# Patient Record
Sex: Female | Born: 1955 | Race: Black or African American | Hispanic: No | Marital: Single | Smoking: Former smoker
Health system: Southern US, Community
[De-identification: ages and names within clinical notes are randomized; demographics above are authoritative.]

## PROBLEM LIST (undated history)

## (undated) DIAGNOSIS — IMO0002 Reserved for concepts with insufficient information to code with codable children: Secondary | ICD-10-CM

## (undated) DIAGNOSIS — N809 Endometriosis, unspecified: Secondary | ICD-10-CM

## (undated) DIAGNOSIS — I10 Essential (primary) hypertension: Secondary | ICD-10-CM

## (undated) DIAGNOSIS — D649 Anemia, unspecified: Secondary | ICD-10-CM

## (undated) DIAGNOSIS — H1010 Acute atopic conjunctivitis, unspecified eye: Secondary | ICD-10-CM

## (undated) DIAGNOSIS — E119 Type 2 diabetes mellitus without complications: Secondary | ICD-10-CM

## (undated) DIAGNOSIS — I517 Cardiomegaly: Secondary | ICD-10-CM

## (undated) DIAGNOSIS — N189 Chronic kidney disease, unspecified: Secondary | ICD-10-CM

## (undated) DIAGNOSIS — F419 Anxiety disorder, unspecified: Secondary | ICD-10-CM

## (undated) DIAGNOSIS — G709 Myoneural disorder, unspecified: Secondary | ICD-10-CM

## (undated) DIAGNOSIS — I639 Cerebral infarction, unspecified: Secondary | ICD-10-CM

## (undated) DIAGNOSIS — M109 Gout, unspecified: Secondary | ICD-10-CM

## (undated) DIAGNOSIS — Z9289 Personal history of other medical treatment: Secondary | ICD-10-CM

## (undated) DIAGNOSIS — F329 Major depressive disorder, single episode, unspecified: Secondary | ICD-10-CM

## (undated) DIAGNOSIS — N939 Abnormal uterine and vaginal bleeding, unspecified: Secondary | ICD-10-CM

## (undated) DIAGNOSIS — G4700 Insomnia, unspecified: Secondary | ICD-10-CM

## (undated) DIAGNOSIS — R51 Headache: Secondary | ICD-10-CM

## (undated) DIAGNOSIS — E039 Hypothyroidism, unspecified: Secondary | ICD-10-CM

## (undated) DIAGNOSIS — L0390 Cellulitis, unspecified: Secondary | ICD-10-CM

## (undated) DIAGNOSIS — L03115 Cellulitis of right lower limb: Secondary | ICD-10-CM

## (undated) DIAGNOSIS — F32A Depression, unspecified: Secondary | ICD-10-CM

## (undated) HISTORY — PX: TONSILLECTOMY: SUR1361

## (undated) HISTORY — DX: Anemia, unspecified: D64.9

## (undated) HISTORY — DX: Essential (primary) hypertension: I10

## (undated) HISTORY — PX: WISDOM TOOTH EXTRACTION: SHX21

---

## 1987-11-20 DIAGNOSIS — Z9289 Personal history of other medical treatment: Secondary | ICD-10-CM

## 1987-11-20 HISTORY — DX: Personal history of other medical treatment: Z92.89

## 2006-01-22 ENCOUNTER — Emergency Department (HOSPITAL_COMMUNITY): Admission: EM | Admit: 2006-01-22 | Discharge: 2006-01-22 | Payer: Self-pay | Admitting: Emergency Medicine

## 2006-02-19 ENCOUNTER — Ambulatory Visit: Payer: Self-pay | Admitting: Internal Medicine

## 2006-02-21 ENCOUNTER — Ambulatory Visit: Payer: Self-pay | Admitting: *Deleted

## 2006-03-18 ENCOUNTER — Ambulatory Visit: Payer: Self-pay | Admitting: Internal Medicine

## 2006-05-14 ENCOUNTER — Ambulatory Visit: Payer: Self-pay | Admitting: Internal Medicine

## 2006-05-14 LAB — CONVERTED CEMR LAB: Pap Smear: NORMAL

## 2006-08-02 ENCOUNTER — Ambulatory Visit: Payer: Self-pay | Admitting: Internal Medicine

## 2006-08-13 ENCOUNTER — Ambulatory Visit (HOSPITAL_COMMUNITY): Admission: RE | Admit: 2006-08-13 | Discharge: 2006-08-13 | Payer: Self-pay | Admitting: Internal Medicine

## 2006-08-26 ENCOUNTER — Ambulatory Visit: Payer: Self-pay | Admitting: Internal Medicine

## 2007-04-12 ENCOUNTER — Encounter (INDEPENDENT_AMBULATORY_CARE_PROVIDER_SITE_OTHER): Payer: Self-pay | Admitting: Internal Medicine

## 2007-04-12 DIAGNOSIS — I10 Essential (primary) hypertension: Secondary | ICD-10-CM | POA: Insufficient documentation

## 2007-04-12 DIAGNOSIS — I517 Cardiomegaly: Secondary | ICD-10-CM

## 2007-04-12 DIAGNOSIS — IMO0002 Reserved for concepts with insufficient information to code with codable children: Secondary | ICD-10-CM

## 2007-04-12 HISTORY — DX: Essential (primary) hypertension: I10

## 2010-12-09 ENCOUNTER — Encounter: Payer: Self-pay | Admitting: Internal Medicine

## 2011-04-26 ENCOUNTER — Ambulatory Visit: Payer: Self-pay | Admitting: Internal Medicine

## 2011-11-20 DIAGNOSIS — I639 Cerebral infarction, unspecified: Secondary | ICD-10-CM

## 2011-11-20 HISTORY — DX: Cerebral infarction, unspecified: I63.9

## 2011-11-20 HISTORY — PX: DILATION AND CURETTAGE OF UTERUS: SHX78

## 2011-11-29 ENCOUNTER — Emergency Department (HOSPITAL_COMMUNITY)
Admission: EM | Admit: 2011-11-29 | Discharge: 2011-11-29 | Disposition: A | Discharge status: Home / Self Care | Payer: Medicare Other | Attending: Emergency Medicine | Admitting: Emergency Medicine

## 2011-11-29 ENCOUNTER — Encounter (HOSPITAL_COMMUNITY): Payer: Self-pay | Admitting: *Deleted

## 2011-11-29 DIAGNOSIS — Y92009 Unspecified place in unspecified non-institutional (private) residence as the place of occurrence of the external cause: Secondary | ICD-10-CM | POA: Insufficient documentation

## 2011-11-29 DIAGNOSIS — I10 Essential (primary) hypertension: Secondary | ICD-10-CM | POA: Insufficient documentation

## 2011-11-29 DIAGNOSIS — J705 Respiratory conditions due to smoke inhalation: Secondary | ICD-10-CM | POA: Insufficient documentation

## 2011-11-29 DIAGNOSIS — R062 Wheezing: Secondary | ICD-10-CM | POA: Insufficient documentation

## 2011-11-29 DIAGNOSIS — X001XXA Exposure to smoke in uncontrolled fire in building or structure, initial encounter: Secondary | ICD-10-CM | POA: Insufficient documentation

## 2011-11-29 DIAGNOSIS — R059 Cough, unspecified: Secondary | ICD-10-CM | POA: Insufficient documentation

## 2011-11-29 DIAGNOSIS — R05 Cough: Secondary | ICD-10-CM | POA: Insufficient documentation

## 2011-11-29 DIAGNOSIS — T59811A Toxic effect of smoke, accidental (unintentional), initial encounter: Secondary | ICD-10-CM

## 2011-11-29 HISTORY — DX: Morbid (severe) obesity due to excess calories: E66.01

## 2011-11-29 HISTORY — DX: Essential (primary) hypertension: I10

## 2011-11-29 MED ORDER — TRAMADOL HCL 50 MG PO TABS
ORAL_TABLET | ORAL | Status: AC
  Administered 2011-11-29: 50 mg via ORAL
  Filled 2011-11-29: qty 1

## 2011-11-29 MED ORDER — TRAMADOL HCL 50 MG PO TABS
50.0000 mg | ORAL_TABLET | Freq: Once | ORAL | Status: AC
  Administered 2011-11-29: 50 mg via ORAL
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
## 2011-11-29 NOTE — ED Notes (Signed)
Pt updated to status, [facility] room [facility] but needs special bed delivered there and then ambulance transport.  Social worker Dahlia Client states she is taking care of everything.

## 2011-11-29 NOTE — ED Notes (Signed)
Pt updated on wait.  °

## 2011-11-29 NOTE — Progress Notes (Signed)
Just received call from [facility].  They will accept patient today.  Will hold patient until bariatric bed can be delivered to facility.  Will complete necessary  Paperwork for transfer and patient will require a ambulance for transport and CSW will arrange.  Will also notify patient and complete insurance authorization for placement.  No other needs at this time.   Ashley Jacobs, MSW LCSW [phone]

## 2011-11-29 NOTE — Progress Notes (Signed)
CSW was called regarding bed situation and bed delivered.  Spoke with RN Pamelia Hoit, and is ready for transport to SNF.  Arranged [facility] ambulance to come and pick patient up.  Packet sent with patient and [facility] aware of transport.  No other needs at this time.  Patient family aware of dc plans as well.  Ashley Jacobs, MSW LCSW [phone]

## 2011-11-29 NOTE — ED Notes (Signed)
Social worker working on disposition, as pt does not have a home to go back to and has no family to go to at this time.
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
## 2011-11-29 NOTE — ED Notes (Signed)
Diet tray ordered 

## 2011-11-29 NOTE — ED Notes (Signed)
Social worker still states that we are waiting on bariatric bed to be delivered to nursing facility and then we will be able to transport pt.

## 2011-11-29 NOTE — Consult Note (Signed)
Clinical Social Worker aware of referral and working on placement for patient.  Given patient is obese and totally dependent with ADLs and needs, patient would not be recommended for placement in a shelter or assisted living.  Patient has been referred to nursing home for short term placement and CSW completed FL2 and Passar for documentation purposes.  Patient information was faxed to [facility] for review and awaiting decision regarding disposition.    Patient aware and agreeable for referral.  Will follow up once clinicals have been reviewed.  Ashley Jacobs, MSW LCSW [phone]

## 2012-04-05 ENCOUNTER — Inpatient Hospital Stay (HOSPITAL_COMMUNITY)
Admission: EM | Admit: 2012-04-05 | Discharge: 2012-04-08 | DRG: 684 | Disposition: A | Discharge status: Skilled Nursing Facility | Payer: Medicare Other | Attending: Family Medicine | Admitting: Family Medicine

## 2012-04-05 ENCOUNTER — Encounter (HOSPITAL_COMMUNITY): Payer: Self-pay | Admitting: Emergency Medicine

## 2012-04-05 DIAGNOSIS — N19 Unspecified kidney failure: Secondary | ICD-10-CM

## 2012-04-05 DIAGNOSIS — E875 Hyperkalemia: Secondary | ICD-10-CM | POA: Diagnosis present

## 2012-04-05 DIAGNOSIS — N170 Acute kidney failure with tubular necrosis: Principal | ICD-10-CM | POA: Diagnosis present

## 2012-04-05 DIAGNOSIS — I10 Essential (primary) hypertension: Secondary | ICD-10-CM | POA: Diagnosis present

## 2012-04-05 DIAGNOSIS — I517 Cardiomegaly: Secondary | ICD-10-CM | POA: Diagnosis present

## 2012-04-05 DIAGNOSIS — E860 Dehydration: Secondary | ICD-10-CM | POA: Diagnosis present

## 2012-04-05 DIAGNOSIS — Z79899 Other long term (current) drug therapy: Secondary | ICD-10-CM

## 2012-04-05 DIAGNOSIS — Z8673 Personal history of transient ischemic attack (TIA), and cerebral infarction without residual deficits: Secondary | ICD-10-CM

## 2012-04-05 DIAGNOSIS — Z23 Encounter for immunization: Secondary | ICD-10-CM

## 2012-04-05 DIAGNOSIS — A088 Other specified intestinal infections: Secondary | ICD-10-CM | POA: Diagnosis present

## 2012-04-05 DIAGNOSIS — F17200 Nicotine dependence, unspecified, uncomplicated: Secondary | ICD-10-CM | POA: Diagnosis present

## 2012-04-05 HISTORY — DX: Cerebral infarction, unspecified: I63.9

## 2012-04-05 LAB — URINALYSIS, ROUTINE W REFLEX MICROSCOPIC
Ketones, ur: NEGATIVE mg/dL
Leukocytes, UA: NEGATIVE
Nitrite: NEGATIVE
Protein, ur: NEGATIVE mg/dL
pH: 5 (ref 5.0–8.0)

## 2012-04-05 LAB — COMPREHENSIVE METABOLIC PANEL
ALT: 17 U/L (ref 0–35)
AST: 22 U/L (ref 0–37)
Albumin: 3.5 g/dL (ref 3.5–5.2)
Calcium: 9.6 mg/dL (ref 8.4–10.5)
Creatinine, Ser: 3.58 mg/dL — ABNORMAL HIGH (ref 0.50–1.10)
Sodium: 133 mEq/L — ABNORMAL LOW (ref 135–145)

## 2012-04-05 LAB — DIFFERENTIAL
Basophils Relative: 0 % (ref 0–1)
Eosinophils Absolute: 0 10*3/uL (ref 0.0–0.7)
Eosinophils Relative: 0 % (ref 0–5)
Lymphs Abs: 1.1 10*3/uL (ref 0.7–4.0)
Neutrophils Relative %: 79 % — ABNORMAL HIGH (ref 43–77)

## 2012-04-05 LAB — CBC
MCH: 29.8 pg (ref 26.0–34.0)
MCHC: 31.8 g/dL (ref 30.0–36.0)
MCV: 93.6 fL (ref 78.0–100.0)
Platelets: 323 10*3/uL (ref 150–400)
RBC: 3.26 MIL/uL — ABNORMAL LOW (ref 3.87–5.11)
RDW: 13.5 % (ref 11.5–15.5)

## 2012-04-05 LAB — OCCULT BLOOD, POC DEVICE: Fecal Occult Bld: NEGATIVE

## 2012-04-05 LAB — LACTIC ACID, PLASMA: Lactic Acid, Venous: 0.8 mmol/L (ref 0.5–2.2)

## 2012-04-05 MED ORDER — SODIUM POLYSTYRENE SULFONATE 15 GM/60ML PO SUSP
15.0000 g | Freq: Once | ORAL | Status: AC
  Administered 2012-04-05: 15 g via ORAL
  Filled 2012-04-05: qty 60

## 2012-04-05 MED ORDER — ONDANSETRON HCL 4 MG/2ML IJ SOLN
4.0000 mg | Freq: Once | INTRAMUSCULAR | Status: AC
  Administered 2012-04-05: 4 mg via INTRAVENOUS
  Filled 2012-04-05: qty 2

## 2012-04-05 MED ORDER — MORPHINE SULFATE 4 MG/ML IJ SOLN
4.0000 mg | Freq: Once | INTRAMUSCULAR | Status: AC
  Administered 2012-04-05: 4 mg via INTRAVENOUS
  Filled 2012-04-05: qty 1

## 2012-04-05 MED ORDER — FUROSEMIDE 10 MG/ML IJ SOLN
40.0000 mg | Freq: Once | INTRAMUSCULAR | Status: AC
  Administered 2012-04-05: 40 mg via INTRAVENOUS
  Filled 2012-04-05: qty 4

## 2012-04-05 NOTE — ED Provider Notes (Signed)
History     CSN: [id number]  Arrival date & time 04/05/12  1727   First MD Initiated Contact with Patient 04/05/12 1728      Chief Complaint  Patient presents with  . Nausea  . Emesis     Patient is a 56 y.o. female presenting with vomiting. The history is provided by the patient.  Emesis  This is a new problem. The current episode started 1 to 2 hours ago. The problem has been gradually improving. The emesis has an appearance of stomach contents. There has been no fever. Associated symptoms include abdominal pain, chills and diarrhea. Risk factors: lives in nursing facility.  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 Hypertension   . Obesities, morbid   . Stroke     History reviewed. No pertinent past surgical history.  History reviewed. No pertinent family history.  History  Substance Use Topics  . Smoking status: Current Some Day Smoker -- 0.5 packs/day    Types: Cigarettes  . Smokeless tobacco: Not on file  . Alcohol Use: No    OB History    Grav Para Term Preterm Abortions TAB SAB Ect Mult Living                  Review of Systems  Constitutional: Positive for chills.  Cardiovascular: Negative for chest pain.  Gastrointestinal: Positive for vomiting, abdominal pain and diarrhea.  Skin: Negative for color change.  All other systems reviewed and are negative.    Allergies  Review of patient's allergies indicates no known allergies.  Home Medications   Current Outpatient Rx  Name Route Sig Dispense Refill  . CYCLOBENZAPRINE HCL 10 MG PO TABS Oral Take 10 mg by mouth 3 (three) times daily as needed. spasms    . FLUOXETINE HCL 20 MG  PO CAPS Oral Take 20 mg by mouth daily.    Marland Kitchen GABAPENTIN 300 MG PO CAPS Oral Take 900 mg by mouth 3 (three) times daily.    Marland Kitchen HYDROCHLOROTHIAZIDE 25 MG PO TABS Oral Take 25 mg by mouth daily.    Marland Kitchen LISINOPRIL 40 MG PO TABS Oral Take 40 mg by mouth daily.    Marland Kitchen METOPROLOL SUCCINATE ER 50 MG PO TB24 Oral Take 50 mg by mouth 2 (two) times daily.    Marland Kitchen POLYETHYLENE GLYCOL 3350 PO PACK Oral Take 17 g by mouth daily.    . TRAMADOL HCL 50 MG PO TABS Oral Take 50 mg by mouth every 6 (six) hours as needed. pain    . TRIAMTERENE-HCTZ 37.5-25 MG PO TABS Oral Take 1 tablet by mouth daily.      BP 115/62  Pulse 92  Temp(Src) 98.8 F (37.1 C) (Oral)  Resp 18  SpO2 99%  Physical Exam CONSTITUTIONAL: Well developed/well nourished HEAD AND FACE: Normocephalic/atraumatic EYES: EOMI/PERRL, no icterus ENMT: Mucous membranes moist NECK: supple no meningeal signs CV: S1/S2 noted, no murmurs/rubs/gallops noted LUNGS: Lungs are clear to auscultation bilaterally, no apparent distress ABDOMEN: morbidly obese, soft, but diffuse tenderness noted.  No rebound or guarding is noted Rectal - stool color normal, chaperone present NEURO: Pt is awake/alert, no distress EXTREMITIES: pulses normal, full ROM SKIN: warm, color  normal PSYCH: no abnormalities of mood noted  ED Course  Procedures    Labs Reviewed  OCCULT BLOOD, POC DEVICE  CBC  DIFFERENTIAL  COMPREHENSIVE METABOLIC PANEL  LIPASE, BLOOD  LACTIC ACID, PLASMA  URINALYSIS, ROUTINE W REFLEX MICROSCOPIC  URINE CULTURE  7:07 PM Pt with abd pain and vomiting/diarrhea, stable at this time Labs ordered and then will reassess 8:27 PM Labs pending Pt taking PO fluids at this time, reports improvement 11:45 PM Pt stable, no further pain but renal failure noted with hyperK Repeat EKG performed and no significant change Will admit Lasix/kayexalate ordered Abdomen soft, I doubt acute abdominal process D/w FP resident, will admit to family medicine  (unassigned) Pt agreeable  MDM  Nursing notes reviewed and considered in documentation All labs/vitals reviewed and considered        Date: 04/05/2012  Rate: 93  Rhythm: normal sinus rhythm  QRS Axis: left  Intervals: normal  ST/T Wave abnormalities: nonspecific ST changes  Conduction Disutrbances:none  Narrative Interpretation:   Old EKG Reviewed: none available at time of interpretation Diffuse ST elevation likely due to early repol, no STEMI   Joya Gaskins, MD 04/06/12 0005

## 2012-04-05 NOTE — ED Notes (Signed)
Pt placed on bariatric bed

## 2012-04-05 NOTE — ED Notes (Signed)
IV team at bedside 

## 2012-04-05 NOTE — ED Notes (Addendum)
Call Lab regarding CMET results. Potassium level 6.4 with no hemolysis. Results given by [facility] in main lab. Dr. Bebe Shaggy made aware.

## 2012-04-05 NOTE — ED Notes (Addendum)
Pt. From [facility]. Diarrhea and chills with increasing Left sided abd pain and distention for 2 days. Pain 9 increases with palpation. One episode of vomiting this morning. No blood. Pt. States she feels better after vomiting. Bed bound. A&0 x3

## 2012-04-05 NOTE — ED Notes (Signed)
Phkeblotomy made aware that we need another green top on pt because of hemolyzation. Stated they will get it. Will continue to monitor.

## 2012-04-05 NOTE — ED Notes (Signed)
Labs sent down to main lab. Will continue to monitor.

## 2012-04-06 ENCOUNTER — Inpatient Hospital Stay (HOSPITAL_COMMUNITY): Payer: Medicare Other

## 2012-04-06 DIAGNOSIS — E860 Dehydration: Secondary | ICD-10-CM

## 2012-04-06 DIAGNOSIS — N179 Acute kidney failure, unspecified: Secondary | ICD-10-CM

## 2012-04-06 LAB — PROTIME-INR
INR: 1.1 (ref 0.00–1.49)
Prothrombin Time: 14.4 seconds (ref 11.6–15.2)

## 2012-04-06 LAB — SODIUM, URINE, RANDOM: Sodium, Ur: 85 mEq/L

## 2012-04-06 LAB — CBC
HCT: 34.2 % — ABNORMAL LOW (ref 36.0–46.0)
MCHC: 30.1 g/dL (ref 30.0–36.0)
Platelets: 302 10*3/uL (ref 150–400)
RDW: 13.9 % (ref 11.5–15.5)

## 2012-04-06 LAB — CARDIAC PANEL(CRET KIN+CKTOT+MB+TROPI)
CK, MB: 3 ng/mL (ref 0.3–4.0)
CK, MB: 3.1 ng/mL (ref 0.3–4.0)
Relative Index: 1.4 (ref 0.0–2.5)
Total CK: 225 U/L — ABNORMAL HIGH (ref 7–177)
Total CK: 243 U/L — ABNORMAL HIGH (ref 7–177)

## 2012-04-06 LAB — CLOSTRIDIUM DIFFICILE BY PCR: Toxigenic C. Difficile by PCR: NEGATIVE

## 2012-04-06 LAB — BASIC METABOLIC PANEL
BUN: 83 mg/dL — ABNORMAL HIGH (ref 6–23)
Calcium: 9.7 mg/dL (ref 8.4–10.5)
Creatinine, Ser: 3.46 mg/dL — ABNORMAL HIGH (ref 0.50–1.10)
GFR calc Af Amer: 16 mL/min — ABNORMAL LOW (ref >90)
GFR calc non Af Amer: 14 mL/min — ABNORMAL LOW (ref >90)

## 2012-04-06 LAB — POTASSIUM: Potassium: 5.2 mEq/L — ABNORMAL HIGH (ref 3.5–5.1)

## 2012-04-06 MED ORDER — ONDANSETRON HCL 4 MG PO TABS: 4.0000 mg | ORAL_TABLET | Freq: Four times a day (QID) | ORAL | Status: DC | PRN

## 2012-04-06 MED ORDER — ONDANSETRON HCL 4 MG/2ML IJ SOLN: 4.0000 mg | Freq: Four times a day (QID) | INTRAMUSCULAR | Status: DC | PRN

## 2012-04-06 MED ORDER — DEXTROSE-NACL 5-0.45 % IV SOLN
INTRAVENOUS | Status: DC
  Administered 2012-04-06 – 2012-04-07 (×5): via INTRAVENOUS
  Administered 2012-04-08: 20 mL/h via INTRAVENOUS

## 2012-04-06 MED ORDER — ASPIRIN 325 MG PO TABS
325.0000 mg | ORAL_TABLET | Freq: Every day | ORAL | Status: DC
  Administered 2012-04-07 – 2012-04-08 (×2): 325 mg via ORAL
  Filled 2012-04-06 (×2): qty 1

## 2012-04-06 MED ORDER — TRAZODONE HCL 50 MG PO TABS
50.0000 mg | ORAL_TABLET | Freq: Every evening | ORAL | Status: DC | PRN
  Filled 2012-04-06: qty 1

## 2012-04-06 MED ORDER — HEPARIN BOLUS VIA INFUSION
4000.0000 [IU] | Freq: Once | INTRAVENOUS | Status: AC
  Administered 2012-04-06: 4000 [IU] via INTRAVENOUS
  Filled 2012-04-06: qty 4000

## 2012-04-06 MED ORDER — SODIUM POLYSTYRENE SULFONATE 15 GM/60ML PO SUSP
30.0000 g | Freq: Once | ORAL | Status: AC
  Administered 2012-04-06: 30 g via ORAL
  Filled 2012-04-06: qty 120

## 2012-04-06 MED ORDER — NITROGLYCERIN 0.4 MG SL SUBL: 0.4000 mg | SUBLINGUAL_TABLET | SUBLINGUAL | Status: DC | PRN

## 2012-04-06 MED ORDER — FLUOXETINE HCL 20 MG PO CAPS
20.0000 mg | ORAL_CAPSULE | Freq: Every day | ORAL | Status: DC
  Administered 2012-04-06 – 2012-04-08 (×3): 20 mg via ORAL
  Filled 2012-04-06 (×3): qty 1

## 2012-04-06 MED ORDER — SODIUM CHLORIDE 0.9 % IV BOLUS (SEPSIS)
1000.0000 mL | Freq: Once | INTRAVENOUS | Status: AC
  Administered 2012-04-06: 1000 mL via INTRAVENOUS

## 2012-04-06 MED ORDER — ACETAMINOPHEN 325 MG PO TABS
650.0000 mg | ORAL_TABLET | Freq: Four times a day (QID) | ORAL | Status: DC | PRN
  Administered 2012-04-07 – 2012-04-08 (×2): 650 mg via ORAL
  Filled 2012-04-06 (×3): qty 2

## 2012-04-06 MED ORDER — ENOXAPARIN SODIUM 40 MG/0.4ML ~~LOC~~ SOLN
40.0000 mg | SUBCUTANEOUS | Status: DC
  Filled 2012-04-06: qty 0.4

## 2012-04-06 MED ORDER — SODIUM CHLORIDE 0.9 % IJ SOLN
3.0000 mL | Freq: Two times a day (BID) | INTRAMUSCULAR | Status: DC
  Administered 2012-04-06 – 2012-04-08 (×5): 3 mL via INTRAVENOUS

## 2012-04-06 MED ORDER — ALUM & MAG HYDROXIDE-SIMETH 200-200-20 MG/5ML PO SUSP
30.0000 mL | Freq: Four times a day (QID) | ORAL | Status: DC | PRN
Start: 2012-04-06 — End: 2012-04-08
  Filled 2012-04-06: qty 30

## 2012-04-06 MED ORDER — HEPARIN (PORCINE) IN NACL 100-0.45 UNIT/ML-% IJ SOLN
1300.0000 [IU]/h | INTRAMUSCULAR | Status: DC
  Administered 2012-04-06: 1300 [IU]/h via INTRAVENOUS
  Filled 2012-04-06 (×2): qty 250

## 2012-04-06 MED ORDER — ACETAMINOPHEN 650 MG RE SUPP: 650.0000 mg | Freq: Four times a day (QID) | RECTAL | Status: DC | PRN

## 2012-04-06 MED ORDER — ASPIRIN 81 MG PO CHEW
162.0000 mg | CHEWABLE_TABLET | Freq: Once | ORAL | Status: AC
  Administered 2012-04-06: 162 mg via ORAL
  Filled 2012-04-06: qty 2

## 2012-04-06 MED ORDER — METOPROLOL TARTRATE 12.5 MG HALF TABLET
12.5000 mg | ORAL_TABLET | Freq: Two times a day (BID) | ORAL | Status: DC
  Administered 2012-04-08: 12.5 mg via ORAL
  Filled 2012-04-06 (×5): qty 1

## 2012-04-06 NOTE — Progress Notes (Signed)
ANTICOAGULATION CONSULT NOTE - Initial Consult  Pharmacy Consult for IV heparin Indication: ACS/STEMI  No Known Allergies  Patient Measurements: Height: 5\' 9"  (175.3 cm) Weight:  (unable to  get weight bed was not zeroed out) IBW/kg (Calculated) : 66.2  Heparin Dosing Weight: 109.5kg  Vital Signs: Temp: 97.5 F (36.4 C) (05/19 0911) Temp src: Oral (05/19 0911) BP: 102/70 mmHg (05/19 0911) Pulse Rate: 79  (05/19 0911)  Labs:  Basename 04/06/12 0500 04/05/12 2208 04/05/12 1807  HGB 10.3* -- 9.7*  HCT 34.2* -- 30.5*  PLT 302 -- 323  APTT -- -- --  LABPROT -- -- --  INR -- -- --  HEPARINUNFRC -- -- --  CREATININE 3.46* 3.58* --  CKTOTAL -- -- --  CKMB -- -- --  TROPONINI -- -- --    The CrCl is unknown because both a height and weight (above a minimum accepted value) are required for this calculation.   Medical History: Past Medical History  Diagnosis Date  . Hypertension   . Obesities, morbid   . Stroke     Medications:  ASA 162mg   Assessment: 53 yoF presented with non-bloody diarrhea x 3 days, abdominal pain x 2days, and emesis, showing signs of ARF with elevated K, SCr.   On 5/19 EKG changes seen, cardiac enzymes pending, and pharmacy asked to manage IV heparin for ACS/STEMI.    Pt confirmed weight ~379lb (172.3kg) as of 2 days ago.  Estimated CrCl ~24 ml/min.  Pt states no hx bleeding.  Baseline PT/INR unknown.  LFTs WNL. Hgb 10.3, plts ok.  No bleeding noted.  No anticoagulants PTA.   Goal of Therapy:  Heparin level 0.3-0.7 units/ml Monitor platelets by anticoagulation protocol: Yes   Plan:  1.  Heparin bolus 4000 units IV x 1 2.  Heparin infusion 1300 units/hr 3.  STAT PT/INR 4.  F/u 8 hour heparin level 5.  Daily CBC, heparin level  Tali Cleaves E 04/06/2012,9:43 AM

## 2012-04-06 NOTE — ED Notes (Signed)
Pt transported to 6700 on zoll monitor with Les EMT and Marchelle Folks EMT.

## 2012-04-06 NOTE — ED Notes (Signed)
Rn unable to take report at this time. Will continue to monitor.

## 2012-04-06 NOTE — Progress Notes (Signed)
Pharmacy: Re-heparin  A:  Patient is a 56 y.o F on heparin for r/o ACS.  First heparin level back is therapeutic (0.31) but is at lower end of therapeutic range.  No bleeding noted.  P: 1) Continue current heparin rate for now. 2)  Will re-check another level in 6 hours to ensure level is still therapeutic before changing to daily monitoring.  Dorna Leitz, PharmD, BCPS

## 2012-04-06 NOTE — Progress Notes (Signed)
Received patient from ED,admitted due to nausea, projectile vomiting,abdominal pain and diarrhea x 2 days.Patient is alert and oriented x3.Patient is from [facility].Patient has history of CVA with left sided weakness,morbidly obese.Patient oriented to room and unit routines.Placed on telemetry and contact isolation for R/O C-diff.Foley catheter intact and draining clear urine.Skin is dry and intact.Edema +1 noted on both lower extremities.Sacrum is intact except for white spots where patient had healed pressure sore,no open area noted.Patient is on a bariatric bed.Will continue to monitor.Callbell in reach. Kashayla Ungerer Joselita,RN

## 2012-04-06 NOTE — H&P (Signed)
FMTS Attending Admit Note  Patient seen and examined by me, discussed with resident team.  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  She denies fevers or chills, no diaphoresis, dyspnea or chest discomfort.  She denies any history of cardiac or renal disease.  Of note, found to have markedly elevated serum Cr (3.58) and K+ 6.4 at admission.  Received lasix in ED and Kayexalate by the admitting team.   Patient had repeat ECG this morning that showed ST depressions in lateral leads.  This morning she denies chest pain or dyspnea/diaphoresis.  She endorses pain in L shoulder/arm, which is consistent in character with her chronic arm pain.  A repeat ECG done now shows resolution of the ST depressions.   Assess/Plan: 56yoF admitted with N/V/D and dehydration, presumed acute kidney injury prerenal from dehydration, and hyperkalemia (presumably secondary to renal dysfunction).  FeNa of questionable benefit in setting of thiazide administration, but may check along with serum Osm and urine Osm.  More concerning in the acute setting is the patient's ECG changes, which earlier today showed ST depression in lateral leads which have normalized.  She has been given ASA 325mg  x2, as well as started on O2 and heparin.  Given patient's lack of mobility, I consider this unstable angina.  Her nausea/vomiting certainly could be an anginal equivalent (more common in women than in men).   We are awaiting a non-hemolyzed K+ level, as well as cardiac enzymes.  Will consult the cardiology service for consultative opinion on whether patient is appropriate for catheterization or other testing.  We will continue to hydrate and follow Cr.  Paula Compton, MD

## 2012-04-06 NOTE — ED Notes (Signed)
Admitting at bedside 

## 2012-04-06 NOTE — ED Notes (Signed)
Nursing Communication: Pt resides at [facility].   Mother's #: [phone] [facility]) Legal Guardian
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
## 2012-04-06 NOTE — Consult Note (Signed)
Reason for Consult: Abdominal pain/minor EKG changes Referring Physician: Dr. Marta Lamas. Lauren Barber is an 56 y.o. female.  HPI: Patient is 56 year old female with past rectal history significant for hypertension history of right CVA with left paresis, morbid obesity, spinal degenerative joint disease, nursing home resident, bedbound, was admitted earlier today because of vague abdominal pain associated with nausea vomiting and poor appetite for last 3 days and was noted to be in acute renal failure with hyperkalemia at the time of admission. Cardiologic consultation is called as patient was noted to have vague epigastric pain associated with minimal slightly upsloping ST depression in inferolateral leads. When patient was noted to be tachycardic which resolved with slowing of heart rate. Patient denies any chest pain shortness of breath. Denies any palpitation lightheadedness or syncope. Patient complains of localized epigastric pain which increases with local pressure. Patient denies history of PND orthopnea but complains of leg swelling. Patient presently denies any complaints except for localized epigastric tenderness. Patient has 2 sets of cardiac enzymes which have been negative EKG repeat showed no acute ischemic changes.  Past Medical History  Diagnosis Date  . Hypertension   . Obesities, morbid   . Stroke     History reviewed. No pertinent past surgical history.  History reviewed. No pertinent family history.  Social History:  reports that she has been smoking Cigarettes.  She has been smoking about .5 packs per day. She does not have any smokeless tobacco history on file. She reports that she does not drink alcohol or use illicit drugs.  Allergies: No Known Allergies  Medications: I have reviewed the patient's current medications.  Results for orders placed during the hospital encounter of 04/05/12 (from the past 48 hour(s))  CBC     Status: Abnormal   Collection Time   04/05/12  6:07 PM      Component Value Range Comment   WBC 7.1  4.0 - 10.5 (K/uL)    RBC 3.26 (*) 3.87 - 5.11 (MIL/uL)    Hemoglobin 9.7 (*) 12.0 - 15.0 (g/dL)    HCT 40.9 (*) 81.1 - 46.0 (%)    MCV 93.6  78.0 - 100.0 (fL)    MCH 29.8  26.0 - 34.0 (pg)    MCHC 31.8  30.0 - 36.0 (g/dL)    RDW 91.4  78.2 - 95.6 (%)    Platelets 323  150 - 400 (K/uL)   DIFFERENTIAL     Status: Abnormal   Collection Time   04/05/12  6:07 PM      Component Value Range Comment   Neutrophils Relative 79 (*) 43 - 77 (%)    Neutro Abs 5.5  1.7 - 7.7 (K/uL)    Lymphocytes Relative 16  12 - 46 (%)    Lymphs Abs 1.1  0.7 - 4.0 (K/uL)    Monocytes Relative 5  3 - 12 (%)    Monocytes Absolute 0.4  0.1 - 1.0 (K/uL)    Eosinophils Relative 0  0 - 5 (%)    Eosinophils Absolute 0.0  0.0 - 0.7 (K/uL)    Basophils Relative 0  0 - 1 (%)    Basophils Absolute 0.0  0.0 - 0.1 (K/uL)   LACTIC ACID, PLASMA     Status: Normal   Collection Time   04/05/12  6:07 PM      Component Value Range Comment   Lactic Acid, Venous 0.8  0.5 - 2.2 (mmol/L)   OCCULT BLOOD, POC DEVICE  Status: Normal   Collection Time   04/05/12  6:13 PM      Component Value Range Comment   Fecal Occult Bld NEGATIVE     URINALYSIS, ROUTINE W REFLEX MICROSCOPIC     Status: Abnormal   Collection Time   04/05/12  6:49 PM      Component Value Range Comment   Color, Urine YELLOW  YELLOW     APPearance HAZY (*) CLEAR     Specific Gravity, Urine 1.019  1.005 - 1.030     pH 5.0  5.0 - 8.0     Glucose, UA NEGATIVE  NEGATIVE (mg/dL)    Hgb urine dipstick NEGATIVE  NEGATIVE     Bilirubin Urine SMALL (*) NEGATIVE     Ketones, ur NEGATIVE  NEGATIVE (mg/dL)    Protein, ur NEGATIVE  NEGATIVE (mg/dL)    Urobilinogen, UA 1.0  0.0 - 1.0 (mg/dL)    Nitrite NEGATIVE  NEGATIVE     Leukocytes, UA NEGATIVE  NEGATIVE  MICROSCOPIC NOT DONE ON URINES WITH NEGATIVE PROTEIN, BLOOD, LEUKOCYTES, NITRITE, OR GLUCOSE <1000 mg/dL.  COMPREHENSIVE METABOLIC PANEL     Status:  Abnormal   Collection Time   04/05/12 10:08 PM      Component Value Range Comment   Sodium 133 (*) 135 - 145 (mEq/L)    Potassium 6.4 (*) 3.5 - 5.1 (mEq/L)    Chloride 105  96 - 112 (mEq/L)    CO2 14 (*) 19 - 32 (mEq/L)    Glucose, Bld 106 (*) 70 - 99 (mg/dL)    BUN 83 (*) 6 - 23 (mg/dL)    Creatinine, Ser 1.61 (*) 0.50 - 1.10 (mg/dL)    Calcium 9.6  8.4 - 10.5 (mg/dL)    Total Protein 9.2 (*) 6.0 - 8.3 (g/dL)    Albumin 3.5  3.5 - 5.2 (g/dL)    AST 22  0 - 37 (U/L)    ALT 17  0 - 35 (U/L)    Alkaline Phosphatase 140 (*) 39 - 117 (U/L)    Total Bilirubin 0.2 (*) 0.3 - 1.2 (mg/dL)    GFR calc non Af Amer 13 (*) >90 (mL/min)    GFR calc Af Amer 16 (*) >90 (mL/min)   MRSA PCR SCREENING     Status: Normal   Collection Time   04/06/12  2:10 AM      Component Value Range Comment   MRSA by PCR NEGATIVE  NEGATIVE    BASIC METABOLIC PANEL     Status: Abnormal   Collection Time   04/06/12  5:00 AM      Component Value Range Comment   Sodium 134 (*) 135 - 145 (mEq/L)    Potassium 6.3 (*) 3.5 - 5.1 (mEq/L)    Chloride 103  96 - 112 (mEq/L)    CO2 11 (*) 19 - 32 (mEq/L)    Glucose, Bld 63 (*) 70 - 99 (mg/dL)    BUN 83 (*) 6 - 23 (mg/dL)    Creatinine, Ser 0.96 (*) 0.50 - 1.10 (mg/dL)    Calcium 9.7  8.4 - 10.5 (mg/dL)    GFR calc non Af Amer 14 (*) >90 (mL/min)    GFR calc Af Amer 16 (*) >90 (mL/min)   CBC     Status: Abnormal   Collection Time   04/06/12  5:00 AM      Component Value Range Comment   WBC 6.6  4.0 - 10.5 (K/uL)    RBC 3.54 (*)  3.87 - 5.11 (MIL/uL)    Hemoglobin 10.3 (*) 12.0 - 15.0 (g/dL)    HCT 16.1 (*) 09.6 - 46.0 (%)    MCV 96.6  78.0 - 100.0 (fL)    MCH 29.1  26.0 - 34.0 (pg)    MCHC 30.1  30.0 - 36.0 (g/dL)    RDW 04.5  40.9 - 81.1 (%)    Platelets 302  150 - 400 (K/uL)   CLOSTRIDIUM DIFFICILE BY PCR     Status: Normal   Collection Time   04/06/12  6:21 AM      Component Value Range Comment   C difficile by pcr NEGATIVE  NEGATIVE    SODIUM, URINE, RANDOM      Status: Normal   Collection Time   04/06/12  7:01 AM      Component Value Range Comment   Sodium, Ur 85     CREATININE, URINE, RANDOM     Status: Normal   Collection Time   04/06/12  7:01 AM      Component Value Range Comment   Creatinine, Urine 135.06     CARDIAC PANEL(CRET KIN+CKTOT+MB+TROPI)     Status: Abnormal   Collection Time   04/06/12  8:43 AM      Component Value Range Comment   Total CK 210 (*) 7 - 177 (U/L)    CK, MB 3.0  0.3 - 4.0 (ng/mL)    Troponin I <0.30  <0.30 (ng/mL)    Relative Index 1.4  0.0 - 2.5    POTASSIUM     Status: Abnormal   Collection Time   04/06/12  8:44 AM      Component Value Range Comment   Potassium 5.2 (*) 3.5 - 5.1 (mEq/L)   PROTIME-INR     Status: Normal   Collection Time   04/06/12 10:05 AM      Component Value Range Comment   Prothrombin Time 14.4  11.6 - 15.2 (seconds)    INR 1.10  0.00 - 1.49    CARDIAC PANEL(CRET KIN+CKTOT+MB+TROPI)     Status: Abnormal   Collection Time   04/06/12  2:25 PM      Component Value Range Comment   Total CK 225 (*) 7 - 177 (U/L)    CK, MB 3.1  0.3 - 4.0 (ng/mL)    Troponin I <0.30  <0.30 (ng/mL)    Relative Index 1.4  0.0 - 2.5    HEPARIN LEVEL (UNFRACTIONATED)     Status: Normal   Collection Time   04/06/12  6:03 PM      Component Value Range Comment   Heparin Unfractionated 0.31  0.30 - 0.70 (IU/mL)     No results found.  Review of Systems  Constitutional: Negative for fever, chills and weight loss.  Respiratory: Negative for cough, hemoptysis, sputum production and shortness of breath.   Cardiovascular: Positive for leg swelling. Negative for chest pain, palpitations and orthopnea.  Gastrointestinal: Positive for nausea, vomiting, abdominal pain and diarrhea.  Genitourinary: Negative for dysuria and urgency.  Musculoskeletal: Positive for back pain. Negative for myalgias.  Neurological: Negative for dizziness.   Blood pressure 102/56, pulse 90, temperature 97.6 F (36.4 C), temperature source  Oral, resp. rate 19, height 5\' 9"  (1.753 m), SpO2 96.00%. Physical Exam  Constitutional: She is oriented to person, place, and time. She appears well-developed.  HENT:  Head: Normocephalic and atraumatic.  Nose: Nose normal.  Mouth/Throat: No oropharyngeal exudate.  Eyes: Conjunctivae and EOM are normal. Pupils are equal,  round, and reactive to light. Left eye exhibits no discharge. No scleral icterus.  Neck: Normal range of motion. Neck supple. No JVD present. No thyromegaly present.  Cardiovascular: Normal rate and regular rhythm.        S1 and S2 soft no S3 gallop noted  Respiratory: Effort normal and breath sounds normal. No respiratory distress. She has no wheezes. She has no rales.  GI: Soft. Bowel sounds are normal. She exhibits distension. There is tenderness (Epigastric tenderness noted no rebound no guarding).  Musculoskeletal:       No clubbing cyanosis 1+ edema  Lymphadenopathy:    She has no cervical adenopathy.  Neurological: She is alert and oriented to person, place, and time.       Left sided paresis    Assessment/Plan: Abdominal pain with status post minor slightly upsloping ST T-wave changes in inferolateral leads doubt significant angina Acute renal failure  Dehydration Morbid obesity Hypertension History of CVA with left paresis Spinal degenerative joint disease Plan Agree with slow hydration aspirin and beta blockers Lifestyle modification and nutrition consult Check repeat EKG in a.m. Will hold off to any cardiac workup at this point in view of patient asymptomatic bedbound and morbidly obese. Check lipid panel, cardiac enzymes and renal panel in a.m.  Robynn Pane 04/06/2012, 7:00 PM     Cardiologic

## 2012-04-07 LAB — LIPID PANEL
Cholesterol: 120 mg/dL (ref 0–200)
Triglycerides: 128 mg/dL (ref <150)
VLDL: 26 mg/dL (ref 0–40)

## 2012-04-07 LAB — TSH: TSH: 1.546 u[IU]/mL (ref 0.350–4.500)

## 2012-04-07 LAB — BASIC METABOLIC PANEL
BUN: 69 mg/dL — ABNORMAL HIGH (ref 6–23)
GFR calc non Af Amer: 20 mL/min — ABNORMAL LOW (ref >90)
Glucose, Bld: 118 mg/dL — ABNORMAL HIGH (ref 70–99)
Potassium: 4.4 mEq/L (ref 3.5–5.1)

## 2012-04-07 LAB — URINE CULTURE
Colony Count: NO GROWTH
Culture  Setup Time: 201305190127

## 2012-04-07 MED ORDER — TRAMADOL HCL 50 MG PO TABS
50.0000 mg | ORAL_TABLET | Freq: Four times a day (QID) | ORAL | Status: DC | PRN
  Administered 2012-04-07 – 2012-04-08 (×2): 50 mg via ORAL
  Filled 2012-04-07 (×2): qty 1

## 2012-04-07 MED ORDER — LOPERAMIDE HCL 2 MG PO CAPS
2.0000 mg | ORAL_CAPSULE | Freq: Two times a day (BID) | ORAL | Status: DC
  Administered 2012-04-07 – 2012-04-08 (×3): 2 mg via ORAL
  Filled 2012-04-07 (×5): qty 1

## 2012-04-07 MED ORDER — HEPARIN SODIUM (PORCINE) 5000 UNIT/ML IJ SOLN
5000.0000 [IU] | Freq: Three times a day (TID) | INTRAMUSCULAR | Status: DC
  Administered 2012-04-07 – 2012-04-08 (×4): 5000 [IU] via SUBCUTANEOUS
  Filled 2012-04-07 (×6): qty 1

## 2012-04-07 NOTE — Progress Notes (Signed)
Subjective:  Patient denies any chest pain or shortness of breath Abdominal pain improved Objective:  Vital Signs in the last 24 hours: Temp:  [97.4 F (36.3 C)-98.5 F (36.9 C)] 97.7 F (36.5 C) (05/20 1810) Pulse Rate:  [93-102] 93  (05/20 1810) Resp:  [18-19] 19  (05/20 1810) BP: (92-113)/(57-73) 113/73 mmHg (05/20 1810) SpO2:  [97 %-100 %] 97 % (05/20 1810)  Intake/Output from previous day: 05/19 0701 - 05/20 0700 In: 1977.5 [P.O.:240; I.V.:1737.5] Out: 952 [Urine:950; Stool:2] Intake/Output from this shift:    Physical Exam: Neck: no adenopathy, no carotid bruit, no JVD and supple, symmetrical, trachea midline Lungs: clear to auscultation bilaterally Heart: regular rate and rhythm, S1, S2 normal, no murmur, click, rub or gallop Abdomen: Bowel sounds present distended mild epigastric tenderness no guarding Extremities: No clubbing cyanosis 2+ edema  Lab Results:  Basename 04/06/12 0500 04/05/12 1807  WBC 6.6 7.1  HGB 10.3* 9.7*  PLT 302 323    Basename 04/07/12 0600 04/06/12 0844 04/06/12 0500  NA 134* -- 134*  K 4.4 5.2* --  CL 106 -- 103  CO2 16* -- 11*  GLUCOSE 118* -- 63*  BUN 69* -- 83*  CREATININE 2.59* -- 3.46*    Basename 04/06/12 2001 04/06/12 1425  TROPONINI <0.30 <0.30   Hepatic Function Panel  Basename 04/05/12 2208  PROT 9.2*  ALBUMIN 3.5  AST 22  ALT 17  ALKPHOS 140*  BILITOT 0.2*  BILIDIR --  IBILI --    Basename 04/07/12 0600  CHOL 120   No results found for this basename: PROTIME in the last 72 hours  Imaging: Imaging results have been reviewed and US Renal Port  04/06/2012  *RADIOLOGY REPORT*  Clinical Data: Acute renal failure  RENAL/URINARY TRACT ULTRASOUND COMPLETE  Comparison: None  Findings: Exam suboptimal due to body habitus.  Right Kidney = 9.9 cm.  No hydronephrosis.  Left kidney = not visualized.  Bladder:  Foley catheter in place.  IMPRESSION:  The suboptimal exam.  Right kidney is grossly normal.  Left kidney is  not identified.  Original Report Authenticated By: Genevive Bi, M.D.    Cardiac Studies:  Assessment/Plan:  Resolving abdominal pain rule out GERD/gastritis peptic ulcer disease Resolving Acute renal failure  Dehydration  Morbid obesity  Hypertension  History of CVA with left paresis  Spinal degenerative joint disease Plan Continue present management I will sign off please call if needed  LOS: 2 days    Sumner Kirchman N 04/07/2012, 7:02 PM

## 2012-04-07 NOTE — Progress Notes (Signed)
Family Medicine Teaching Service Attending Note  I interviewed and examined patient Lauren Barber and reviewed their tests and x-rays.  I discussed with Dr. Konrad Dolores and reviewed their note for today.  I agree with their assessment and plan.     Additionally  Currently feeling some better Mild diffuse abdomen discomfort.  Able to eat this AM without problems.  Unsure if still having diarrhea Abdomen - obese no guarding or rebound Small amount of liquid stool in rectal tube Await cdiff if negative try anti diarrheals No signs of acute abdomen
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
## 2012-04-07 NOTE — Clinical Documentation Improvement (Signed)
BMI DOCUMENTATION CLARIFICATION QUERY  THIS DOCUMENT IS NOT A PERMANENT PART OF THE MEDICAL RECORD  TO RESPOND TO THE THIS QUERY, FOLLOW THE INSTRUCTIONS BELOW:  1. If needed, update documentation for the patient's encounter via the notes activity.  2. Access this query again and click edit on the In [employer].  3. After updating, or not, click F2 to complete all highlighted (required) fields concerning your review. Select "additional documentation in the medical record" OR "no additional documentation provided".  4. Click Sign note button.  5. The deficiency will fall out of your In Basket *Please let us know if you are not able to complete this workflow by phone or e-mail (listed below).         04/07/12  Dear Dr. Mauricio Po Marton Redwood  In an effort to better capture your patient's severity of illness, reflect appropriate length of stay and utilization of resources, a review of the patient medical record has revealed the following indicators.    Based on your clinical judgment, please clarify and document in a progress note and/or discharge summary the clinical condition associated with the following supporting information:  In responding to this query please exercise your independent judgment.  The fact that a query is asked, does not imply that any particular answer is desired or expected.  Possible Clinical conditions:  Morbid Obesity W/ BMI= ? ( NEED BMI NOTED IN PN)  Underweight w/BMI=  Other condition  Cannot Clinically determine    Sign & Symptoms:(AS PER NOTES) "Pt is immobile and morbidly obese. Requires diapering"   BMI=? ( NEED BMI NOTED IN PN)     Reviewed:  no additional documentation provided  Thank You,  Joanette Gula Delk RN,BSN Clinical Documentation Specialist: [phone] Pager Health Information Management [place]

## 2012-04-07 NOTE — Progress Notes (Signed)
Family Medicine Teaching Service [facility] Progress Note  Patient name: Lauren Barber Medical record number: [id number] Date of birth: [birth year] Age: 56 y.o. Gender: female    LOS: 2 days   Primary Care Provider: Florentina Jenny, MD, MD  Overnight Events:  NAEO, Complaining of discomfort from rectal tube w/ movement, and pain at IV site in R shoulder w/ movement.    Objective: Vital signs in last 24 hours: Temp:  [97.4 F (36.3 C)-98.5 F (36.9 C)] 98.5 F (36.9 C) (05/20 0457) Pulse Rate:  [75-102] 102  (05/20 0457) Resp:  [16-19] 18  (05/20 0457) BP: (92-107)/(56-70) 93/57 mmHg (05/20 0457) SpO2:  [95 %-100 %] 98 % (05/20 0457)  Wt Readings from Last 3 Encounters:  11/29/11 500 lb (226.799 kg)     Current Facility-Administered Medications  Medication Dose Route Frequency Provider Last Rate Last Dose  . acetaminophen (TYLENOL) tablet 650 mg  650 mg Oral Q6H PRN Edd Arbour, MD       Or  . acetaminophen (TYLENOL) suppository 650 mg  650 mg Rectal Q6H PRN Edd Arbour, MD      . alum & mag hydroxide-simeth (MAALOX/MYLANTA) 200-200-20 MG/5ML suspension 30 mL  30 mL Oral Q6H PRN Edd Arbour, MD      . aspirin chewable tablet 162 mg  162 mg Oral Once Ozella Rocks, MD   162 mg at 04/06/12 0949  . aspirin tablet 325 mg  325 mg Oral Daily Ozella Rocks, MD      . dextrose 5 %-0.45 % sodium chloride infusion   Intravenous Continuous Edd Arbour, MD 150 mL/hr at 04/07/12 0531    . FLUoxetine (PROZAC) capsule 20 mg  20 mg Oral Daily Edd Arbour, MD   20 mg at 04/06/12 1129  . heparin bolus via infusion 4,000 Units  4,000 Units Intravenous Once Riccardo Dubin Summe, PHARMD   4,000 Units at 04/06/12 1123  . heparin injection 5,000 Units  5,000 Units Subcutaneous Q8H Ozella Rocks, MD      . metoprolol tartrate (LOPRESSOR) tablet 12.5 mg  12.5 mg Oral BID Robynn Pane, MD      . nitroGLYCERIN (NITROSTAT) SL tablet 0.4 mg  0.4 mg Sublingual Q5 Min x 3 PRN Ozella Rocks,  MD      . ondansetron [facility]) tablet 4 mg  4 mg Oral Q6H PRN Edd Arbour, MD       Or  . ondansetron [facility]) injection 4 mg  4 mg Intravenous Q6H PRN Edd Arbour, MD      . sodium chloride 0.9 % bolus 1,000 mL  1,000 mL Intravenous Once Ozella Rocks, MD   1,000 mL at 04/06/12 1124  . sodium chloride 0.9 % bolus 1,000 mL  1,000 mL Intravenous Once Durwin Reges, MD   1,000 mL at 04/06/12 1609  . sodium chloride 0.9 % injection 3 mL  3 mL Intravenous Q12H Edd Arbour, MD   3 mL at 04/06/12 1123  . sodium polystyrene (KAYEXALATE) 15 GM/60ML suspension 30 g  30 g Oral Once Ozella Rocks, MD   30 g at 04/06/12 0949  . traZODone (DESYREL) tablet 50 mg  50 mg Oral QHS PRN Edd Arbour, MD      . DISCONTD: enoxaparin (LOVENOX) injection 40 mg  40 mg Subcutaneous Q24H Edd Arbour, MD      . DISCONTD: heparin ADULT infusion 100 units/mL (25000 units/250 mL)  1,300 Units/hr Intravenous Continuous Riccardo Dubin Summe, PHARMD 13 mL/hr at 04/06/12  1119 1,300 Units/hr at 04/06/12 1119     PE: Gen: NAD Obese HEENT: MMM CV: II/VI systolic murmur, RRR Res: CTAB,  NWG:NFAO tenderness to palpation, diffusely Ext/Musc: 2+ pulses, 2+ LE edema Neuro: CN grossly intact but decreased coordination of UE and minimal movement of LE   No output noted in rectal tube  Labs/Studies:  Basic Metabolic Panel:    Component Value Date/Time   NA 134* 04/07/2012 0600   K 4.4 04/07/2012 0600   CL 106 04/07/2012 0600   CO2 16* 04/07/2012 0600   BUN 69* 04/07/2012 0600   CREATININE 2.59* 04/07/2012 0600   GLUCOSE 118* 04/07/2012 0600   CALCIUM 8.4 04/07/2012 0600    Lipid Panel     Component Value Date/Time   CHOL 120 04/07/2012 0600   TRIG 128 04/07/2012 0600   HDL 34* 04/07/2012 0600   CHOLHDL 3.5 04/07/2012 0600   VLDL 26 04/07/2012 0600   LDLCALC 60 04/07/2012 0600       Assessment/Plan: Lauren Barber is a 56 y.o. year old female presenting with dehydration from resolving viral gastroenteritis,  diarrhea, acute renal failure, and hyperkalemia   1. Acute renal failure: Likely prerenal ATN due to hypotension from low volume status from viral gastro. Resolving. Cr 2.59 today. UOP still low. Called [facility] and previous Cr from 03/17/12 was 1.16 - DC Fluids as taking good PO - Strict I/O  - Repeat BMET in am  - Continue foley until tomorrow   2. ID: AFVSS. GI symptoms likely from viral gastro. Afebrile. No signs of infection.  - C. Diff pending   3. FEN/GI: Hyperkalemia resolved.   - BMET in am  - Fluids as above.  - Transfer off Telemetry  - Regular diet   4. Prophylaxis: immobile  - Lovenox renally dosed  - Airo bed to prevent decubitus ulcers  - Rotate pt   5. Disposition: Pending improvement and renal workup and resolution of HyperK  Signed: Shelly Flatten, MD Family Medicine Resident PGY-1 [phone] 04/07/2012 8:24 AM

## 2012-04-07 NOTE — Clinical Documentation Improvement (Signed)
DOCUMENTATION CLARIFICATION QUERY  THIS DOCUMENT IS NOT A PERMANENT PART OF THE MEDICAL RECORD  TO RESPOND TO THE THIS QUERY, FOLLOW THE INSTRUCTIONS BELOW:  1. If needed, update documentation for the patient's encounter via the notes activity.  2. Access this query again and click edit on the In [employer].  3. After updating, or not, click F2 to complete all highlighted (required) fields concerning your review. Select "additional documentation in the medical record" OR "no additional documentation provided".  4. Click Sign note button.  5. The deficiency will fall out of your In Basket *Please let us know if you are not able to complete this workflow by phone or e-mail (listed below).  Please update your documentation within the medical record to reflect your response to this query.                                                                                        04/07/12   Dear Dr. Mauricio Po / Associates,  In a better effort to capture your patient's severity of illness, reflect appropriate length of stay and utilization of resources, a review of the patient medical record has revealed the following indicators.    Based on your clinical judgment, please clarify and document in a progress note and/or discharge summary the clinical condition associated with the following supporting information:  In responding to this query please exercise your independent judgment.  The fact that a query is asked, does not imply that any particular answer is desired or expected. Marland Kitchen   Possible Clinical Conditions?  -Functional Quadraplegia  -Other condition  -Cannot Clinically Determine    Supporting Information:  Risk Factors:(As per H&P and PN) "bed bound 2nd to unknown neurological disorder."  Signs & Symptoms:(As per H&P and PN) "limited mobility due to neurodegenerative process with weakness LUE/bilat LEs"  "Pt is immobile and morbidly obese. Requires diapering"  Pt lives at  SNF[facility] as per notes  Treatment: (As per PN) Prophylaxis: immobile   - Lovenox renally dosed   - Airo bed to prevent decubitus ulcers   - Rotate pt    You may use possible, probable, or suspect with inpatient documentation. possible, probable, suspected diagnoses MUST be documented at the time of discharge  Reviewed: no additional documentation in the medical record  Thank Bonita Quin  Joanette Gula Delk RN,BSN  Clinical Documentation Specialist: [phone] Pager Health Information Management [place]

## 2012-04-08 LAB — BASIC METABOLIC PANEL
BUN: 48 mg/dL — ABNORMAL HIGH (ref 6–23)
Creatinine, Ser: 1.58 mg/dL — ABNORMAL HIGH (ref 0.50–1.10)
GFR calc Af Amer: 42 mL/min — ABNORMAL LOW (ref >90)
GFR calc non Af Amer: 36 mL/min — ABNORMAL LOW (ref >90)

## 2012-04-08 MED ORDER — LOPERAMIDE HCL 2 MG PO CAPS: 2.0000 mg | ORAL_CAPSULE | ORAL | Status: AC | PRN

## 2012-04-08 MED ORDER — PNEUMOCOCCAL VAC POLYVALENT 25 MCG/0.5ML IJ INJ: 0.5000 mL | INJECTION | INTRAMUSCULAR | Status: DC

## 2012-04-08 MED ORDER — LOPERAMIDE HCL 2 MG PO CAPS
2.0000 mg | ORAL_CAPSULE | ORAL | Status: DC | PRN
  Filled 2012-04-08: qty 1

## 2012-04-08 NOTE — Progress Notes (Signed)
Clinical Child psychotherapist (CSW) observed that pt ready for dc back to [facility] CSW provided dc summary and avs to facility representative who informed CSW that pt okay to return to facility. CSW prepared and placed dc packet in pt shadow chart and also provided RN with the contact number to the facility in order to give RN report. Pt aware of dc to facility, CSW contacted [facility] for a non emergency ambulance and CSW signing off.  Theresia Bough, MSW, Theresia Majors [phone]

## 2012-04-08 NOTE — Discharge Summary (Signed)
Family Medicine Resident Discharge Summary  Patient ID: Lauren Barber [id number] 56 y.o. Sep 25, 1956  Admit date: 04/05/2012  Discharge date and time: No discharge date for patient encounter.   Admitting Physician: Barbaraann Barthel, MD   Discharge Physician: Pearlean Brownie, MD  Admission Diagnoses: Dehydration [276.51] Hyperkalemia [276.7] Renal failure [586] abd pain  Discharge Diagnoses: Hyperkalemia, Acute Kidney Injury  Admission Condition: fair  Discharged Condition: good  Indication for Admission: Cr. 3.58 on admission, K 6.4 on admission  Hospital Course: Lauren Barber is a 56 y.o. year old female presenting with dehydration from resolving viral gastroenteritis w/ continued diarrhea, acute renal failure, and hyperkalemia  Diarrhea: Pt w/ resolving n/v/diarrhea at time of admission. C.Diff ordered and negative. Diarrhea improved over the course of admission. Rectal tube placed and discontinues as output improved. Minimal stool output at time of DC.   Acute renal failure: Prerenal from ATN secondary to hypotension from low volume status from significant dehydration from gastroenteritis. Serial Cr taken and improved w/ fluids. Pt received 2 1L boluses and maintanence fluids fo D5 1/2NS until 04/07/12. Cr. On admission 3.58. Cr 1.58 at discharge. Foley catheter placed on admission to monitor UOP and was DC at time of discharge.   Hyperkalemia: Pt potassium noted to be 6.4 on admission. Likely secondary to ARF from gastro. Pt given Lasix an dK-ayexalate in ED. EKG normal in ED. Pt placed on telemetry during admission w/o event. Pt had f/u EKG showing possible ST depression. Heparin drip, ASA chewable, started and cardiology was consulted. Repeat EKG normal. Cardiology felt that abnormal EKG was transient ishcemia and not a candidate for stress test/cath. K resolved at time of discharge.   Consults: cardiology  Significant Diagnostic Studies: See hospital course and end of  note  Discharge Exam: Gen: NAD Obese  HEENT: MMM  CV: II/VI systolic murmur, RRR  Res: CTAB,  HQI:ONGE tenderness to palpation, diffusely  Ext/Musc: 2+ pulses, 2+ LE edema L>R Neuro: CN grossly intact but decreased coordination of UE and minimal movement of LE   Disposition: long term care facility  Patient Instructions:  Medication List  As of 04/08/2012 12:50 PM   STOP taking these medications         hydrochlorothiazide 25 MG tablet      lisinopril 40 MG tablet      metoprolol succinate 50 MG 24 hr tablet      triamterene-hydrochlorothiazide 37.5-25 MG per tablet         TAKE these medications         cyclobenzaprine 10 MG tablet   Commonly known as: FLEXERIL   Take 10 mg by mouth 3 (three) times daily as needed. spasms      FLUoxetine 20 MG capsule   Commonly known as: PROZAC   Take 20 mg by mouth daily.      gabapentin 300 MG capsule   Commonly known as: NEURONTIN   Take 900 mg by mouth 3 (three) times daily.      loperamide 2 MG capsule   Commonly known as: IMODIUM   Take 1 capsule (2 mg total) by mouth as needed for diarrhea or loose stools.      polyethylene glycol packet   Commonly known as: MIRALAX / GLYCOLAX   Take 17 g by mouth daily.      traMADol 50 MG tablet   Commonly known as: ULTRAM   Take 50 mg by mouth every 6 (six) hours as needed. pain  Activity: activity as tolerated Diet: regular diet Wound Care: none needed  Follow-up with Dr. Redmond School at [facility]   Follow-up Items: 1. Restart home BP Medications after return of Cr to baseline (Metoprolol XL and Lisinopril and Maxzide) 2. Continue Imodium until diarrhea resolves.   Signed: Shelly Flatten, MD Family Medicine Resident PGY-1 [phone] 04/08/2012 12:30 PM

## 2012-04-08 NOTE — Discharge Instructions (Signed)
Please continue to take all of your medications as prescribed. Please take your imodium as needed for diarrhea. Please follow up with Dr. Redmond School.     Dehydration, Adult Dehydration means your body does not have as much fluid as it needs. Your kidneys, brain, and heart will not work properly without the right amount of fluids and salt.  HOME CARE  Ask your doctor how to replace body fluid losses (rehydrate).   Drink enough fluids to keep your pee (urine) clear or pale yellow.   Drink small amounts of fluids often if you feel sick to your stomach (nauseous) or throw up (vomit).   Eat like you normally do.   Avoid:   Foods or drinks high in sugar.   Bubbly (carbonated) drinks.   Juice.   Very hot or cold fluids.   Drinks with caffeine.   Fatty, greasy foods.   Alcohol.   Tobacco.   Eating too much.   Gelatin desserts.   Wash your hands to avoid spreading germs (bacteria, viruses).   Only take medicine as told by your doctor.   Keep all doctor visits as told.  GET HELP RIGHT AWAY IF:   You cannot drink something without throwing up.   You get worse even with treatment.   Your vomit has blood in it or looks greenish.   Your poop (stool) has blood in it or looks black and tarry.   You have not peed in 6 to 8 hours.   You pee a small amount of very dark pee.   You have a fever.   You pass out (faint).   You have belly (abdominal) pain that gets worse or stays in one spot (localizes).   You have a rash, stiff neck, or bad headache.   You get easily annoyed, sleepy, or are hard to wake up.   You feel weak, dizzy, or very thirsty.  MAKE SURE YOU:   Understand these instructions.   Will watch your condition.   Will get help right away if you are not doing well or get worse.  Document Released: 09/01/2009 Document Revised: 10/25/2011 Document Reviewed: 06/25/2011 [facility] Patient Information 2012 [place], [place].

## 2012-04-08 NOTE — Progress Notes (Signed)
Foley catheter was removed,pt. Tolerated well.IV was removed,tele was d/c.pt. Ready to go back to facility.report was call by Tennis Must RN to [facility]

## 2012-04-08 NOTE — Progress Notes (Signed)
Called [facility] facility and spoke with Tiffany,RN,patient has no record of having gotten pneumonia vaccine. Kynlie Jane Joselita,RN

## 2012-04-08 NOTE — Discharge Summary (Signed)
I have reviewed this discharge summary and agree.    

## 2012-04-08 NOTE — Progress Notes (Signed)
Rectal tube was d/c as per MD. Order,pt. Tolerated it fine.When pt. Was getting clean up some blood was noticed on the pad,RN checked and saw some blood around foley catheter and the urine in the catheter had some pink color.After cleaning the pt. And positioned ,MD. was notified.keep monitoring pt. And assessing her needs.

## 2012-04-09 NOTE — Progress Notes (Signed)
Faythe Ghee to be D/C'd [facility] per MD order.  Discussed with the patient the After Visit Summary and all questions fully answered. [facility] transport to [facility]. VSS at transfer. IV left in place. Report given to [facility].  Susann Givens, RN, [facility] 04/09/2012 4:51 PM

## 2012-07-05 ENCOUNTER — Emergency Department (HOSPITAL_COMMUNITY)
Admission: EM | Admit: 2012-07-05 | Discharge: 2012-07-06 | Disposition: A | Discharge status: Skilled Nursing Facility | Payer: Medicare Other | Attending: Emergency Medicine | Admitting: Emergency Medicine

## 2012-07-05 ENCOUNTER — Encounter (HOSPITAL_COMMUNITY): Payer: Self-pay | Admitting: Emergency Medicine

## 2012-07-05 DIAGNOSIS — I10 Essential (primary) hypertension: Secondary | ICD-10-CM | POA: Insufficient documentation

## 2012-07-05 DIAGNOSIS — Z8673 Personal history of transient ischemic attack (TIA), and cerebral infarction without residual deficits: Secondary | ICD-10-CM | POA: Insufficient documentation

## 2012-07-05 DIAGNOSIS — F17200 Nicotine dependence, unspecified, uncomplicated: Secondary | ICD-10-CM | POA: Insufficient documentation

## 2012-07-05 DIAGNOSIS — R220 Localized swelling, mass and lump, head: Secondary | ICD-10-CM | POA: Insufficient documentation

## 2012-07-05 DIAGNOSIS — R221 Localized swelling, mass and lump, neck: Secondary | ICD-10-CM | POA: Insufficient documentation

## 2012-07-05 DIAGNOSIS — R600 Localized edema: Secondary | ICD-10-CM

## 2012-07-05 MED ORDER — DIPHENHYDRAMINE HCL 50 MG/ML IJ SOLN
50.0000 mg | Freq: Once | INTRAMUSCULAR | Status: AC
  Administered 2012-07-06: 50 mg via INTRAVENOUS
  Filled 2012-07-05: qty 1

## 2012-07-05 NOTE — ED Notes (Signed)
Pt place on big boy bed

## 2012-07-05 NOTE — ED Provider Notes (Signed)
History     CSN: [id number]  Arrival date & time 07/05/12  2238   First MD Initiated Contact with Patient 07/05/12 2312      Chief Complaint  Patient presents with  . Allergic Reaction    (Consider location/radiation/quality/duration/timing/severity/associated sxs/prior treatment) HPI Comments: Patient presents with complaints of "allergic reaction." She reports having seasoned chicken and cabbage for lunch today and shortly after noticed her lower lip and face started to swell. She was given a benadryl tablet which she think helped reduce the swelling a bit. She denies any trouble swallowing or difficulty breathing. Currently she is experiencing lower lip swelling, as the facial swelling has resolved. She still does not have any evidence of airway compromise. She was sent here from her local assisted living facility because they did not have the means to treat her if she got worse.   Patient is a 56 y.o. female presenting with allergic reaction.  Allergic Reaction The primary symptoms do not include wheezing, shortness of breath, cough, abdominal pain, nausea, vomiting, diarrhea, dizziness or rash.    Past Medical History  Diagnosis Date  . Hypertension   . Obesities, morbid   . Stroke     No past surgical history on file.  No family history on file.  History  Substance Use Topics  . Smoking status: Current Some Day Smoker -- 0.5 packs/day    Types: Cigarettes  . Smokeless tobacco: Not on file  . Alcohol Use: No    OB History    Grav Para Term Preterm Abortions TAB SAB Ect Mult Living                  Review of Systems  Constitutional: Negative for fever, chills, diaphoresis and fatigue.  HENT: Positive for facial swelling. Negative for trouble swallowing.   Eyes: Negative for visual disturbance.  Respiratory: Negative for cough, shortness of breath and wheezing.   Cardiovascular: Negative for chest pain.  Gastrointestinal: Negative for nausea, vomiting,  abdominal pain and diarrhea.  Musculoskeletal: Negative for arthralgias.  Skin: Negative for rash.  Neurological: Negative for dizziness, facial asymmetry, weakness, light-headedness and headaches.    Allergies  Review of patient's allergies indicates no known allergies.  Home Medications   Current Outpatient Rx  Name Route Sig Dispense Refill  . ACETAMINOPHEN 325 MG PO TABS Oral Take 650 mg by mouth every 4 (four) hours as needed. For pain    . CYCLOBENZAPRINE HCL 10 MG PO TABS Oral Take 10 mg by mouth 3 (three) times daily as needed. spasms    . DIPHENHYDRAMINE HCL 25 MG PO TABS Oral Take 25 mg by mouth every 6 (six) hours as needed. For itching    . FLUCONAZOLE 100 MG PO TABS Oral Take 100 mg by mouth daily. For 4 days-fungal rash    . FLUOXETINE HCL 20 MG PO CAPS Oral Take 20 mg by mouth daily.    . FUROSEMIDE 40 MG PO TABS Oral Take 40 mg by mouth daily.    Marland Kitchen GABAPENTIN 300 MG PO CAPS Oral Take 900 mg by mouth 3 (three) times daily.    Marland Kitchen LISINOPRIL 5 MG PO TABS Oral Take 5 mg by mouth daily.    Marland Kitchen LOPERAMIDE HCL 2 MG PO CAPS Oral Take 2 mg by mouth daily as needed. For loose stool    . MENTHOL-ZINC OXIDE 0.2-20 % EX PSTE Apply externally Apply 1 application topically 3 (three) times daily.    Marland Kitchen POLYETHYLENE GLYCOL 3350 PO PACK  Oral Take 17 g by mouth daily.    . TRAMADOL HCL 50 MG PO TABS Oral Take 50 mg by mouth every 6 (six) hours as needed. pain    . TRIAMCINOLONE ACETONIDE 0.1 % EX LOTN Topical Apply 1 application topically 2 (two) times daily.      BP 141/82  SpO2 96%  Physical Exam  Nursing note and vitals reviewed. Constitutional: She is oriented to person, place, and time. She appears well-developed and well-nourished. No distress.  HENT:  Head: Normocephalic and atraumatic.  Mouth/Throat: Oropharynx is clear and moist. No oropharyngeal exudate.       Apparent lower lip swelling. No evidence of throat swelling or airway compromise.   Eyes: Conjunctivae are normal.  Pupils are equal, round, and reactive to light. No scleral icterus.  Neck: Normal range of motion.  Cardiovascular: Normal rate and regular rhythm.  Exam reveals no gallop and no friction rub.   No murmur heard. Pulmonary/Chest: Effort normal and breath sounds normal. No respiratory distress. She has no wheezes. She has no rales. She exhibits no tenderness.  Abdominal: Soft. There is no tenderness.  Musculoskeletal: Normal range of motion.  Neurological: She is alert and oriented to person, place, and time.  Skin: Skin is warm and dry. She is not diaphoretic.  Psychiatric: She has a normal mood and affect. Her behavior is normal.    ED Course  Procedures (including critical care time)  Labs Reviewed - No data to display No results found.   No diagnosis found.    MDM  11:31 PM Patient will receive 50mg  benadryl IV to help with the facial swelling. She will be observed to avoid progression of the facial swelling.   12:39 AM Patient reports some relief with the benadryl. She is resting comfortably without evidence of respiratory compromise.   12:56 AM Patient can be discharged with instructions to return to the ED if symptoms worsen or breathing is compromised. Plan discussed with Dr. Lorenso Courier who is agreeable.     Emilia Beck, PA-C 07/06/12 [phone]

## 2012-07-05 NOTE — ED Notes (Signed)
Per EMS, the patient comes from a local assisted living facility [facility] in [place]).  She advised that she has NKDA, but she is allergic to "certain food seasonings" nos.  She first noticed swelling in her lips and face at 1200 at which time she took diphenhydramine 50 mg, po, and she went to sleep.  When she woke up, the swelling persisted, however she stated that she has never had any breathing related problems (i.e. throat closing, shortness of breath, etc.).

## 2012-07-06 NOTE — ED Notes (Signed)
Pt stated ate some chicken strip carbage, thinking it was the spices on the chicken strips she reacted to, noted some swelling  to lower lip area, denies any difficult swallowing, denies any SOB , N/V, pt is awake and alert. Sat 100% on RA, VSS. Pt is obese, non ambulatory, Gen body edema.

## 2012-07-06 NOTE — ED Provider Notes (Signed)
I also obtained an H&P.  Agree with this plan.  As pt does take lisinopril and the angioedema she experienced cannot be directly attributed to the food that she had eaten, she was advised to discontinue that medication and to discuss other alternatives for BP control with her PMD as this event may be secondary to ACEi usage.  Tobin Chad, MD 07/06/12 [phone]

## 2012-07-11 ENCOUNTER — Emergency Department (HOSPITAL_COMMUNITY): Payer: Medicare Other

## 2012-07-11 ENCOUNTER — Emergency Department (HOSPITAL_COMMUNITY)
Admission: EM | Admit: 2012-07-11 | Discharge: 2012-07-11 | Disposition: A | Discharge status: Skilled Nursing Facility | Payer: Medicare Other | Attending: Emergency Medicine | Admitting: Emergency Medicine

## 2012-07-11 ENCOUNTER — Encounter (HOSPITAL_COMMUNITY): Payer: Self-pay | Admitting: *Deleted

## 2012-07-11 DIAGNOSIS — F17200 Nicotine dependence, unspecified, uncomplicated: Secondary | ICD-10-CM | POA: Insufficient documentation

## 2012-07-11 DIAGNOSIS — I10 Essential (primary) hypertension: Secondary | ICD-10-CM | POA: Insufficient documentation

## 2012-07-11 DIAGNOSIS — N939 Abnormal uterine and vaginal bleeding, unspecified: Secondary | ICD-10-CM

## 2012-07-11 DIAGNOSIS — Z79899 Other long term (current) drug therapy: Secondary | ICD-10-CM | POA: Insufficient documentation

## 2012-07-11 DIAGNOSIS — N898 Other specified noninflammatory disorders of vagina: Secondary | ICD-10-CM | POA: Insufficient documentation

## 2012-07-11 DIAGNOSIS — Z8673 Personal history of transient ischemic attack (TIA), and cerebral infarction without residual deficits: Secondary | ICD-10-CM | POA: Insufficient documentation

## 2012-07-11 LAB — URINALYSIS, ROUTINE W REFLEX MICROSCOPIC
Bilirubin Urine: NEGATIVE
Ketones, ur: NEGATIVE mg/dL
Nitrite: POSITIVE — AB
pH: 8 (ref 5.0–8.0)

## 2012-07-11 LAB — POCT I-STAT, CHEM 8
Calcium, Ion: 1.24 mmol/L — ABNORMAL HIGH (ref 1.12–1.23)
Chloride: 102 mEq/L (ref 96–112)
Creatinine, Ser: 1.2 mg/dL — ABNORMAL HIGH (ref 0.50–1.10)
Glucose, Bld: 95 mg/dL (ref 70–99)
HCT: 35 % — ABNORMAL LOW (ref 36.0–46.0)

## 2012-07-11 LAB — BASIC METABOLIC PANEL
BUN: 30 mg/dL — ABNORMAL HIGH (ref 6–23)
CO2: 27 mEq/L (ref 19–32)
Calcium: 10.1 mg/dL (ref 8.4–10.5)
Creatinine, Ser: 1.25 mg/dL — ABNORMAL HIGH (ref 0.50–1.10)
Glucose, Bld: 125 mg/dL — ABNORMAL HIGH (ref 70–99)

## 2012-07-11 LAB — URINE MICROSCOPIC-ADD ON

## 2012-07-11 NOTE — ED Provider Notes (Signed)
History     CSN: [id number]  Arrival date & time 07/11/12  1152   First MD Initiated Contact with Patient 07/11/12 1506      Chief Complaint  Patient presents with  . Vaginal Bleeding  . Abdominal Cramping    (Consider location/radiation/quality/duration/timing/severity/associated sxs/prior treatment) HPI  56 year old female with history of stroke affecting left side, and morbid obesity and presents for evaluations of vaginal bleeding.  Pt lives at [facility] due to hx of stroke and unable to care for herself. Patient reports this morning  she experiencing several bouts of cramping sensation in her abdomen. Pain lasting only for a few seconds and resolved without treatment. Pain is most noticeable to her right lower quadrant, and nonradiating. The nursing aide who was cleaning and bathing her today noticed a significant amount of blood which were found on the chuck and exudes from her vagina. It was describes as jelly consistency, and purple in color.  The physician from the facility evaluate pt and and found that the bleeding is from vagina and not from rectum.  Therefore, pt was sent to ER for further evaluation.  Pt reports she no longer having menstruation.  She denies lightheadedness or dizziness.  Denies fever, chills, n/v/d, rectal pain, weight changes, night sweats.  No prior hx of ovarian cyst, or fibroid.  Pt has foley catheter for the past week due to urinary incontinence.    Past Medical History  Diagnosis Date  . Hypertension   . Obesities, morbid   . Stroke     History reviewed. No pertinent past surgical history.  History reviewed. No pertinent family history.  History  Substance Use Topics  . Smoking status: Current Some Day Smoker -- 0.5 packs/day    Types: Cigarettes  . Smokeless tobacco: Not on file  . Alcohol Use: No    OB History    Grav Para Term Preterm Abortions TAB SAB Ect Mult Living                  Review of Systems  All  other systems reviewed and are negative.    Allergies  Review of patient's allergies indicates no known allergies.  Home Medications   Current Outpatient Rx  Name Route Sig Dispense Refill  . ACETAMINOPHEN 325 MG PO TABS Oral Take 650 mg by mouth every 4 (four) hours as needed. For pain    . CYCLOBENZAPRINE HCL 10 MG PO TABS Oral Take 10 mg by mouth 3 (three) times daily as needed. spasms    . FLUOXETINE HCL 40 MG PO CAPS Oral Take 40 mg by mouth daily.    . FUROSEMIDE 40 MG PO TABS Oral Take 40 mg by mouth daily.    Marland Kitchen GABAPENTIN 300 MG PO CAPS Oral Take 900 mg by mouth 3 (three) times daily.    Marland Kitchen LISINOPRIL 5 MG PO TABS Oral Take 5 mg by mouth daily. Hold for SBP <100    . MENTHOL-ZINC OXIDE 0.2-20 % EX PSTE Apply externally Apply 1 application topically 3 (three) times daily. To buttocks & thighs    . POLYETHYLENE GLYCOL 3350 PO PACK Oral Take 17 g by mouth daily.    . TRAMADOL HCL 50 MG PO TABS Oral Take 100 mg by mouth 3 (three) times daily as needed. For pain    . TRIAMCINOLONE ACETONIDE 0.1 % EX LOTN Topical Apply 1 application topically 2 (two) times daily.    Marland Kitchen DIPHENHYDRAMINE HCL 25 MG PO TABS Oral  Take 25 mg by mouth every 6 (six) hours as needed. For itching    . LOPERAMIDE HCL 2 MG PO CAPS Oral Take 2 mg by mouth daily as needed. For loose stool      BP 153/90  Pulse 84  Temp 97.4 F (36.3 C) (Oral)  Resp 20  SpO2 98%  Physical Exam  Nursing note and vitals reviewed. Constitutional: She is oriented to person, place, and time. She appears well-developed and well-nourished. No distress.       Morbidly obese  HENT:  Head: Atraumatic.  Mouth/Throat: Oropharynx is clear and moist.  Eyes: Conjunctivae are normal.  Neck: Normal range of motion. Neck supple.  Cardiovascular: Normal rate and regular rhythm.  Exam reveals no gallop and no friction rub.   No murmur heard. Pulmonary/Chest: Effort normal and breath sounds normal.  Abdominal: Soft.       Mild tenderness to  right upper and right lower abdomen on palpation without guarding or rebound tenderness. Exam was limited due to body habitus  Genitourinary:       Homero Fellers blood noted at the vaginal orifice. Tenderness to right labia without indurations or Fluctuant. Foley catheter in place. Limited evaluation secondary to large body habitus  Neurological: She is alert and oriented to person, place, and time.  Skin: Skin is warm. No rash noted.  Psychiatric: She has a normal mood and affect.    ED Course  Procedures (including critical care time)  Labs Reviewed - No data to display No results found.   No diagnosis found.  1. Abnormal vaginal bleeding  MDM  Abnormal vaginal bleeding, with mild low abnormal pain. Examination was limited due to large body habitus. Pelvic ultrasound ordered for further evaluation. Patient otherwise in no acute distress, is alert oriented x3. Abdomen nonsurgical. No rectal involvement.  UA appears clear, doubt hematuria.  No B symptoms suggestive of cancer.     6:55 PM Pelvic US shows no acute finding, although it was limited due to body habitus.   Patient has a hemoglobin of 11.9 and a hematocrit of 35, this is at her baseline. Her potassium is 5.2. We'll check a repeat potassium level. Her BUN and creatinine is elevated, however lower than her usual baseline.  Pt is hungry and requesting food.  Pt otherwise is in no apparent distress.    8:07 PM Bleeding from vagina, no tenderness on reevaluation. Discussed with my attending.  Plan to have pt f/u with OBGYN for further care.  Pt stable to be d/c.      Fayrene Helper, PA-C 07/11/12 2008

## 2012-07-11 NOTE — ED Notes (Signed)
Pt. Had c/o abdominal pain. And when the sheet was removed from under her they noticed blood. Pt. Was evaluated by the facility doctor and noted with vaginal bleeding.  Pt. Doe snot have her period anymore.  Upon arrival to the ER pt. Does not have c/o abdominal pain.  Pt. Is currently watching TV.

## 2012-07-11 NOTE — ED Notes (Signed)
Atttempted to contact [facility]  at [phone].  Recording came on.  No answer after 10 minutes.  D/c instructions given to pt to give to staff.

## 2012-07-11 NOTE — ED Notes (Signed)
Pt continues with small amt of vaginal bleeding.  Placed in butterfly position for examination by Greta Doom, PA.  Pt tolerated positioning.  Peri pad replaced

## 2012-07-13 NOTE — ED Provider Notes (Signed)
Medical screening examination/treatment/procedure(s) were conducted as a shared visit with non-physician practitioner(s) and myself.  I personally evaluated the patient during the encounter  Tobin Chad, MD 07/13/12 1643

## 2012-08-01 ENCOUNTER — Encounter (HOSPITAL_COMMUNITY): Payer: Self-pay | Admitting: Emergency Medicine

## 2012-08-01 ENCOUNTER — Emergency Department (HOSPITAL_COMMUNITY)
Admission: EM | Admit: 2012-08-01 | Discharge: 2012-08-01 | Disposition: A | Discharge status: Home / Self Care | Payer: Medicare Other | Attending: Emergency Medicine | Admitting: Emergency Medicine

## 2012-08-01 DIAGNOSIS — F17200 Nicotine dependence, unspecified, uncomplicated: Secondary | ICD-10-CM | POA: Insufficient documentation

## 2012-08-01 DIAGNOSIS — N950 Postmenopausal bleeding: Secondary | ICD-10-CM | POA: Insufficient documentation

## 2012-08-01 DIAGNOSIS — D649 Anemia, unspecified: Secondary | ICD-10-CM

## 2012-08-01 HISTORY — DX: Cerebral infarction, unspecified: I63.9

## 2012-08-01 HISTORY — DX: Abnormal uterine and vaginal bleeding, unspecified: N93.9

## 2012-08-01 LAB — CBC WITH DIFFERENTIAL/PLATELET
Basophils Absolute: 0 10*3/uL (ref 0.0–0.1)
HCT: 26.7 % — ABNORMAL LOW (ref 36.0–46.0)
Lymphs Abs: 2.6 10*3/uL (ref 0.7–4.0)
MCH: 28.9 pg (ref 26.0–34.0)
MCHC: 31.8 g/dL (ref 30.0–36.0)
MCV: 90.8 fL (ref 78.0–100.0)
Monocytes Absolute: 0.3 10*3/uL (ref 0.1–1.0)
Monocytes Relative: 3 % (ref 3–12)
Neutro Abs: 7 10*3/uL (ref 1.7–7.7)
Platelets: 446 10*3/uL — ABNORMAL HIGH (ref 150–400)
RDW: 14.6 % (ref 11.5–15.5)

## 2012-08-01 LAB — TYPE AND SCREEN: Antibody Screen: NEGATIVE

## 2012-08-01 LAB — BASIC METABOLIC PANEL
BUN: 22 mg/dL (ref 6–23)
CO2: 23 mEq/L (ref 19–32)
Calcium: 9.5 mg/dL (ref 8.4–10.5)
GFR calc non Af Amer: 57 mL/min — ABNORMAL LOW (ref >90)
Glucose, Bld: 107 mg/dL — ABNORMAL HIGH (ref 70–99)
Sodium: 135 mEq/L (ref 135–145)

## 2012-08-01 MED ORDER — FERROUS SULFATE 325 (65 FE) MG PO TABS: 325.0000 mg | ORAL_TABLET | Freq: Every day | ORAL | Status: DC

## 2012-08-01 NOTE — ED Provider Notes (Signed)
History     CSN: [id number]  Arrival date & time 08/01/12  1204   First MD Initiated Contact with Patient 08/01/12 1300      Chief Complaint  Patient presents with  . Anemia    (Consider location/radiation/quality/duration/timing/severity/associated sxs/prior treatment) HPI Comments: Lauren Barber 56 y.o. female   The chief complaint is: Patient presents with:   Anemia   The patient has medical history significant for:   Past Medical History:   Hypertension                                                 Obesities, morbid                                            Stroke                                                       CVA (cerebral vascular accident)                             Vaginal bleeding                                            Patient presents with complaints of anemia secondary to post-menopausal bleeding for 20 days. Bleed has since stopped 3 days ago. Patient was seen at [facility] for the same complaint by Dr. Lorenso Courier on 07/11/12. Patient was assessed and transvaginal US was unremarkable. Patient hemoglobin stable at that visit. She presents today from an assisted living facility because she was told that her hemoglobin is 6.8 and hematocrit 20.9 and to come to ED for transfusion. Denies fever or chills. Denies NVD or abdominal pain. Denies dysuria, frequency, or vaginal discharge. Denies fatigue, SOB, CP, or palpitations.      The history is provided by the patient.    Past Medical History  Diagnosis Date  . Hypertension   . Obesities, morbid   . Stroke   . CVA (cerebral vascular accident)   . Vaginal bleeding     History reviewed. No pertinent past surgical history.  No family history on file.  History  Substance Use Topics  . Smoking status: Current Some Day Smoker -- 0.5 packs/day    Types: Cigarettes  . Smokeless tobacco: Not on file  . Alcohol Use: No    OB History    Grav Para Term Preterm Abortions TAB SAB Ect Mult Living          Review of Systems  Constitutional: Negative for fever, chills and fatigue.  Respiratory: Negative for shortness of breath.   Cardiovascular: Negative for chest pain and palpitations.  Gastrointestinal: Negative for nausea, vomiting, abdominal pain and diarrhea.  Genitourinary: Positive for menstrual problem. Negative for dysuria, frequency, vaginal bleeding and vaginal discharge.  All other systems reviewed and are negative.    Allergies  Review of patient's allergies indicates no known allergies.  Home Medications   Current Outpatient Rx  Name Route Sig Dispense Refill  . ACETAMINOPHEN 325 MG PO TABS Oral Take 650 mg by mouth every 4 (four) hours as needed. For pain    . CYCLOBENZAPRINE HCL 10 MG PO TABS Oral Take 10 mg by mouth 3 (three) times daily as needed. spasms    . DIPHENHYDRAMINE HCL 25 MG PO TABS Oral Take 25 mg by mouth every 6 (six) hours as needed. For itching    . EPINEPHRINE 0.3 MG/0.3ML IJ DEVI Intramuscular Inject 0.3 mg into the muscle once.    Marland Kitchen FLUOXETINE HCL 40 MG PO CAPS Oral Take 40 mg by mouth daily.    . FUROSEMIDE 40 MG PO TABS Oral Take 40 mg by mouth daily.    Marland Kitchen GABAPENTIN 300 MG PO CAPS Oral Take 900 mg by mouth 3 (three) times daily.    Marland Kitchen LISINOPRIL 5 MG PO TABS Oral Take 5 mg by mouth daily. Hold for SBP <100    . LOPERAMIDE HCL 2 MG PO CAPS Oral Take 2 mg by mouth daily as needed. For loose stool    . MENTHOL-ZINC OXIDE 0.2-20 % EX PSTE Apply externally Apply 1 application topically 3 (three) times daily. To buttocks & thighs    . POLYETHYLENE GLYCOL 3350 PO PACK Oral Take 17 g by mouth daily.    . TRAMADOL HCL 50 MG PO TABS Oral Take 100 mg by mouth 3 (three) times daily as needed. For pain    . TRIAMCINOLONE ACETONIDE 0.1 % EX LOTN Topical Apply 1 application topically 2 (two) times daily.      BP 128/77  Pulse 86  Temp 97.6 F (36.4 C) (Oral)  Resp 16  SpO2 100%  Physical Exam  Nursing note and vitals reviewed. Constitutional:  She appears well-developed and well-nourished. No distress.  HENT:  Head: Normocephalic.  Mouth/Throat: Oropharynx is clear and moist.  Eyes: Conjunctivae normal and EOM are normal. No scleral icterus.  Neck: Normal range of motion. Neck supple.  Cardiovascular: Normal rate, regular rhythm and normal heart sounds.   Pulmonary/Chest: Effort normal and breath sounds normal. She has no wheezes.  Abdominal: Soft. Bowel sounds are normal. There is no tenderness.  Skin: Skin is warm and dry.    ED Course  Procedures (including critical care time)   Labs Reviewed  CBC WITH DIFFERENTIAL  BASIC METABOLIC PANEL  TYPE AND SCREEN   Results for orders placed during the hospital encounter of 08/01/12  CBC WITH DIFFERENTIAL      Component Value Range   WBC 10.2  4.0 - 10.5 K/uL   RBC 2.94 (*) 3.87 - 5.11 MIL/uL   Hemoglobin 8.5 (*) 12.0 - 15.0 g/dL   HCT 16.1 (*) 09.6 - 04.5 %   MCV 90.8  78.0 - 100.0 fL   MCH 28.9  26.0 - 34.0 pg   MCHC 31.8  30.0 - 36.0 g/dL   RDW 40.9  81.1 - 91.4 %   Platelets 446 (*) 150 - 400 K/uL   Neutrophils Relative 69  43 - 77 %   Lymphocytes Relative 25  12 - 46 %   Monocytes Relative 3  3 - 12 %   Eosinophils Relative 3  0 - 5 %   Basophils Relative 0  0 - 1 %   Neutro Abs 7.0  1.7 - 7.7 K/uL   Lymphs Abs 2.6  0.7 - 4.0 K/uL   Monocytes Absolute 0.3  0.1 - 1.0 K/uL  Eosinophils Absolute 0.3  0.0 - 0.7 K/uL   Basophils Absolute 0.0  0.0 - 0.1 K/uL   RBC Morphology ROULEAUX     WBC Morphology WHITE COUNT CONFIRMED ON SMEAR     Smear Review PLATELET COUNT CONFIRMED BY SMEAR    BASIC METABOLIC PANEL      Component Value Range   Sodium 135  135 - 145 mEq/L   Potassium 5.2 (*) 3.5 - 5.1 mEq/L   Chloride 101  96 - 112 mEq/L   CO2 23  19 - 32 mEq/L   Glucose, Bld 107 (*) 70 - 99 mg/dL   BUN 22  6 - 23 mg/dL   Creatinine, Ser 0.98  0.50 - 1.10 mg/dL   Calcium 9.5  8.4 - 11.9 mg/dL   GFR calc non Af Amer 57 (*) >90 mL/min   GFR calc Af Amer 66 (*) >90  mL/min    No results found.   1. Anemia   2. Post-menopausal bleeding       MDM  Patient presented for blood transfusion from assisted living facility. Hemoglobin 6.9 and Hematocrit 20.9. BMP: mild hyperkalemia, most likely due to hemolysis of red blood cells. Patient hemoglobin stable on this visit. CBC: remarkable for microcytic anemia. Discharged on supplemental iron and have her follow-up with OB/GYN for further evaluation of her post menopausal bleeding. Return precautions given. No red flags for hypovolemic shock or high output heart failure secondary to anemia.        Pixie Casino, PA-C 08/01/12 1506

## 2012-08-01 NOTE — ED Notes (Addendum)
Per [facility] pt is from [facility] reports to have had a vaginal bleed that has stopped as reported by staff at [facility]. and hemoglobin 6.8 and hematocrit 20.9 on 07/30/12. Pt here for blood. Pt is without c/o. Pt in nad.

## 2012-08-01 NOTE — ED Notes (Signed)
[facility] called for transport.  

## 2012-08-01 NOTE — ED Notes (Signed)
IV team called to start IV.   

## 2012-08-01 NOTE — ED Notes (Signed)
Pt denies pain or bleeding currently. Pt reports she has had vaginal bleeding x20 days and stopped bleeding three days ago. Pt denies history of similar issues or past medical history of a blood transfusion.  Pt reports she went through menopause in her 2s and did not have bleeding until recently. Pt denies pain or weakness. Pt reports facility checked lab work and found low hemoglobin.  Pt alert and oriented and in no apparent distress at this time.

## 2012-08-04 NOTE — ED Provider Notes (Signed)
Medical screening examination/treatment/procedure(s) were conducted as a shared visit with non-physician practitioner(s) and myself.  I personally evaluated the patient during the encounter   Benny Lennert, MD 08/04/12 [phone]

## 2012-08-21 ENCOUNTER — Other Ambulatory Visit (HOSPITAL_COMMUNITY)
Admission: RE | Admit: 2012-08-21 | Discharge: 2012-08-21 | Disposition: A | Discharge status: Home / Self Care | Payer: Medicare Other | Source: Ambulatory Visit | Attending: Obstetrics & Gynecology | Admitting: Obstetrics & Gynecology

## 2012-08-21 ENCOUNTER — Ambulatory Visit (INDEPENDENT_AMBULATORY_CARE_PROVIDER_SITE_OTHER): Payer: Medicare Other | Admitting: Obstetrics & Gynecology

## 2012-08-21 ENCOUNTER — Encounter: Payer: Self-pay | Admitting: Obstetrics & Gynecology

## 2012-08-21 VITALS — BP 128/75 | HR 92 | Ht 69.0 in | Wt >= 6400 oz

## 2012-08-21 DIAGNOSIS — Z1151 Encounter for screening for human papillomavirus (HPV): Secondary | ICD-10-CM | POA: Insufficient documentation

## 2012-08-21 DIAGNOSIS — Z124 Encounter for screening for malignant neoplasm of cervix: Secondary | ICD-10-CM | POA: Insufficient documentation

## 2012-08-21 DIAGNOSIS — N859 Noninflammatory disorder of uterus, unspecified: Secondary | ICD-10-CM | POA: Insufficient documentation

## 2012-08-21 DIAGNOSIS — N920 Excessive and frequent menstruation with regular cycle: Secondary | ICD-10-CM

## 2012-08-21 HISTORY — PX: BIOPSY ENDOMETRIAL: PRO11

## 2012-08-21 MED ORDER — MEGESTROL ACETATE 40 MG PO TABS: 40.0000 mg | ORAL_TABLET | Freq: Every day | ORAL | Status: DC

## 2012-08-21 NOTE — Progress Notes (Signed)
Subjective:     Patient ID: Lauren Barber, female   DOB: December 12, 1955, 56 y.o.   MRN: [id number]  HPI  Pt with complaints of menorrhagia. Had heavy bleeding for 19month leading to anemia and blood transfusions x 2 here for eval and endobx     Review of Systems Past Medical History  Diagnosis Date  . Hypertension   . Obesities, morbid   . Stroke   . CVA (cerebral vascular accident)   . Vaginal bleeding    Past Surgical History  Procedure Date  . Cesarean section    Current outpatient prescriptions:acetaminophen (TYLENOL) 325 MG tablet, Take 650 mg by mouth every 4 (four) hours as needed. For pain, Disp: , Rfl: ;  cyclobenzaprine (FLEXERIL) 10 MG tablet, Take 10 mg by mouth 3 (three) times daily as needed. spasms, Disp: , Rfl: ;  diphenhydrAMINE (BENADRYL) 25 MG tablet, Take 25 mg by mouth every 6 (six) hours as needed. For itching, Disp: , Rfl:  EPINEPHrine (EPIPEN) 0.3 mg/0.3 mL DEVI, Inject 0.3 mg into the muscle once., Disp: , Rfl: ;  ferrous sulfate 325 (65 FE) MG tablet, Take 1 tablet (325 mg total) by mouth daily., Disp: 30 tablet, Rfl: 0;  FLUoxetine (PROZAC) 40 MG capsule, Take 40 mg by mouth daily., Disp: , Rfl: ;  furosemide (LASIX) 40 MG tablet, Take 40 mg by mouth daily., Disp: , Rfl:  gabapentin (NEURONTIN) 300 MG capsule, Take 900 mg by mouth 3 (three) times daily., Disp: , Rfl: ;  lisinopril (PRINIVIL,ZESTRIL) 5 MG tablet, Take 5 mg by mouth daily. Hold for SBP <100, Disp: , Rfl: ;  loperamide (IMODIUM) 2 MG capsule, Take 2 mg by mouth daily as needed. For loose stool, Disp: , Rfl: ;  Menthol-Zinc Oxide 0.2-20 % PSTE, Apply 1 application topically 3 (three) times daily. To buttocks & thighs, Disp: , Rfl:  polyethylene glycol (MIRALAX / GLYCOLAX) packet, Take 17 g by mouth daily., Disp: , Rfl: ;  traMADol (ULTRAM) 50 MG tablet, Take 100 mg by mouth 3 (three) times daily as needed. For pain, Disp: , Rfl: ;  triamcinolone lotion (KENALOG) 0.1 %, Apply 1 application topically 2 (two)  times daily., Disp: , Rfl: ;  megestrol (MEGACE) 40 MG tablet, Take 1 tablet (40 mg total) by mouth daily., Disp: 30 tablet, Rfl: 6      Objective:   Physical ExamBP 128/75  Pulse 92  Ht 5\' 9"  (1.753 m)  Wt 402 lb (182.346 kg)  BMI 59.37 kg/m2 GU: EGBUS: no lesions Vagina: no blood in vault Cervix: no lesion; no mucopurulent d/c The indications for endometrial biopsy were reviewed.   Risks of the biopsy including cramping, bleeding, infection, uterine perforation, inadequate specimen and need for additional procedures  were discussed. The patient states she understands and agrees to undergo procedure today. Consent was signed. Time out was performed. Urine HCG was negative. A sterile speculum was placed in the patient's vagina and the cervix was prepped with Betadine. A single-toothed tenaculum was placed on the anterior lip of the cervix to stabilize it. The 3 mm pipelle was introduced into the endometrial cavity without difficulty to a depth of 11cm, and a moderate amount of tissue was obtained and sent to pathology. The instruments were removed from the patient's vagina. Minimal bleeding from the cervix was noted. The patient tolerated the procedure well. Routine post-procedure instructions were given to the patient. The patient will follow up to review the results and for further management.  07/09/12 U/S Findings:  Uterus: Measures 11.0 x 6.8 x 5.7 cm. No myometrial abnormalities  are demonstrated.  Endometrium: Normal in thickness measuring 12 mm.  Right ovary: Not visualized.  Left ovary: Not visualized.  Other findings: No free fluid  IMPRESSION:  1. Limited examination. The ovaries could not be visualized. No  obvious adnexal mass.  2. The uterus is normal.   Assessment:     Assessment:  DUB Morbid obesity     Plan:     F/u Endo bx Megace 40mg  q day F/u 3-6 months  Xena Propst L. Harraway-Smith, M.D., Evern Core

## 2012-08-21 NOTE — Patient Instructions (Signed)

## 2012-09-09 ENCOUNTER — Telehealth: Payer: Self-pay | Admitting: *Deleted

## 2012-09-09 NOTE — Telephone Encounter (Signed)
Message left by Noralee Chars- LPN from [facility] requesting results of endo Bx to be faxed to [phone].  Result faxed per request.

## 2012-09-15 ENCOUNTER — Encounter: Payer: Medicare Other | Admitting: Obstetrics & Gynecology

## 2012-11-26 ENCOUNTER — Other Ambulatory Visit (HOSPITAL_COMMUNITY): Payer: Self-pay | Admitting: Internal Medicine

## 2012-11-26 DIAGNOSIS — L0390 Cellulitis, unspecified: Secondary | ICD-10-CM

## 2012-11-27 ENCOUNTER — Other Ambulatory Visit (HOSPITAL_COMMUNITY): Payer: Self-pay | Admitting: Internal Medicine

## 2012-11-27 ENCOUNTER — Ambulatory Visit (HOSPITAL_COMMUNITY)
Admission: RE | Admit: 2012-11-27 | Discharge: 2012-11-27 | Disposition: A | Discharge status: Home / Self Care | Payer: Medicare Other | Source: Ambulatory Visit | Attending: Internal Medicine | Admitting: Internal Medicine

## 2012-11-27 DIAGNOSIS — L0390 Cellulitis, unspecified: Secondary | ICD-10-CM

## 2012-11-27 DIAGNOSIS — Z452 Encounter for adjustment and management of vascular access device: Secondary | ICD-10-CM | POA: Insufficient documentation

## 2012-11-27 NOTE — Procedures (Signed)
Successful placement of right brachail approach 44 cm single lumen PICC line with tip at the superior caval-atrial junction.  The PICC line is ready for immediate use.

## 2013-02-05 ENCOUNTER — Ambulatory Visit (INDEPENDENT_AMBULATORY_CARE_PROVIDER_SITE_OTHER): Payer: Medicare Other | Admitting: Obstetrics & Gynecology

## 2013-02-05 ENCOUNTER — Encounter: Payer: Self-pay | Admitting: Obstetrics & Gynecology

## 2013-02-05 VITALS — BP 125/82 | Temp 98.7°F | Wt >= 6400 oz

## 2013-02-05 DIAGNOSIS — N925 Other specified irregular menstruation: Secondary | ICD-10-CM

## 2013-02-05 DIAGNOSIS — N949 Unspecified condition associated with female genital organs and menstrual cycle: Secondary | ICD-10-CM

## 2013-02-05 MED ORDER — MEGESTROL ACETATE 40 MG PO TABS: 40.0000 mg | ORAL_TABLET | Freq: Every day | ORAL | Status: DC

## 2013-02-05 NOTE — Patient Instructions (Signed)
Dysfunctional Uterine Bleeding  Normally, menstrual periods begin between ages 11 to 17 in young women. A normal menstrual cycle/period may begin every 23 days up to 35 days and lasts from 1 to 7 days. Around 12 to 14 days before your menstrual period starts, ovulation (ovary produces an egg) occurs. When counting the time between menstrual periods, count from the first day of bleeding of the previous period to the first day of bleeding of the next period.  Dysfunctional (abnormal) uterine bleeding is bleeding that is different from a normal menstrual period. Your periods may come earlier or later than usual. They may be lighter, have blood clots or be heavier. You may have bleeding between periods, or you may skip one period or more. You may have bleeding after sexual intercourse, bleeding after menopause, or no menstrual period.  CAUSES   · Pregnancy (normal, miscarriage, tubal).  · IUDs (intrauterine device, birth control).  · Birth control pills.  · Hormone treatment.  · Menopause.  · Infection of the cervix.  · Blood clotting problems.  · Infection of the inside lining of the uterus.  · Endometriosis, inside lining of the uterus growing in the pelvis and other female organs.  · Adhesions (scar tissue) inside the uterus.  · Obesity or severe weight loss.  · Uterine polyps inside the uterus.  · Cancer of the vagina, cervix, or uterus.  · Ovarian cysts or polycystic ovary syndrome.  · Medical problems (diabetes, thyroid disease).  · Uterine fibroids (noncancerous tumor).  · Problems with your female hormones.  · Endometrial hyperplasia, very thick lining and enlarged cells inside of the uterus.  · Medicines that interfere with ovulation.  · Radiation to the pelvis or abdomen.  · Chemotherapy.  DIAGNOSIS   · Your doctor will discuss the history of your menstrual periods, medicines you are taking, changes in your weight, stress in your life, and any medical problems you may have.  · Your doctor will do a physical  and pelvic examination.  · Your doctor may want to perform certain tests to make a diagnosis, such as:  · Pap test.  · Blood tests.  · Cultures for infection.  · CT scan.  · Ultrasound.  · Hysteroscopy.  · Laparoscopy.  · MRI.  · Hysterosalpingography.  · D and C.  · Endometrial biopsy.  TREATMENT   Treatment will depend on the cause of the dysfunctional uterine bleeding (DUB). Treatment may include:  · Observing your menstrual periods for a couple of months.  · Prescribing medicines for medical problems, including:  · Antibiotics.  · Hormones.  · Birth control pills.  · Removing an IUD (intrauterine device, birth control).  · Surgery:  · D and C (scrape and remove tissue from inside the uterus).  · Laparoscopy (examine inside the abdomen with a lighted tube).  · Uterine ablation (destroy lining of the uterus with electrical current, laser, heat, or freezing).  · Hysteroscopy (examine cervix and uterus with a lighted tube).  · Hysterectomy (remove the uterus).  HOME CARE INSTRUCTIONS   · If medicines were prescribed, take exactly as directed. Do not change or switch medicines without consulting your caregiver.  · Long term heavy bleeding may result in iron deficiency. Your caregiver may have prescribed iron pills. They help replace the iron that your body lost from heavy bleeding. Take exactly as directed.  · Do not take aspirin or medicines that contain aspirin one week before or during your menstrual period. Aspirin may make   the bleeding worse.  · If you need to change your sanitary pad or tampon more than once every 2 hours, stay in bed with your feet elevated and a cold pack on your lower abdomen. Rest as much as possible, until the bleeding stops or slows down.  · Eat well-balanced meals. Eat foods high in iron. Examples are:  · Leafy green vegetables.  · Whole-grain breads and cereals.  · Eggs.  · Meat.  · Liver.  · Do not try to lose weight until the abnormal bleeding has stopped and your blood iron level is  back to normal. Do not lift more than ten pounds or do strenuous activities when you are bleeding.  · For a couple of months, make note on your calendar, marking the start and ending of your period, and the type of bleeding (light, medium, heavy, spotting, clots or missed periods). This is for your caregiver to better evaluate your problem.  SEEK MEDICAL CARE IF:   · You develop nausea (feeling sick to your stomach) and vomiting, dizziness, or diarrhea while you are taking your medicine.  · You are getting lightheaded or weak.  · You have any problems that may be related to the medicine you are taking.  · You develop pain with your DUB.  · You want to remove your IUD.  · You want to stop or change your birth control pills or hormones.  · You have any type of abnormal bleeding mentioned above.  · You are over 16 years old and have not had a menstrual period yet.  · You are 57 years old and you are still having menstrual periods.  · You have any of the symptoms mentioned above.  · You develop a rash.  SEEK IMMEDIATE MEDICAL CARE IF:   · An oral temperature above 102° F (38.9° C) develops.  · You develop chills.  · You are changing your sanitary pad or tampon more than once an hour.  · You develop abdominal pain.  · You pass out or faint.  Document Released: 11/02/2000 Document Revised: 01/28/2012 Document Reviewed: 10/04/2009  [employer]® Patient Information ©2013 [employer].

## 2013-02-05 NOTE — Progress Notes (Signed)
Subjective:     Patient ID: Lauren Barber, female   DOB: [birth year], 57 y.o.   MRN: [id number]  HPI Pt with h/o DUB. She started bleeding 2 weeks ago- spotting for 1 day.  She has no complaints today.  She does not keep track of which meds she is on.  Past Medical History  Diagnosis Date  . Hypertension   . Obesities, morbid   . Stroke   . CVA (cerebral vascular accident)   . Vaginal bleeding        Past Surgical History  Procedure Laterality Date  . Cesarean section     Current Outpatient Prescriptions on File Prior to Visit  Medication Sig Dispense Refill  . acetaminophen (TYLENOL) 325 MG tablet Take 650 mg by mouth every 4 (four) hours as needed. For pain      . cyclobenzaprine (FLEXERIL) 10 MG tablet Take 10 mg by mouth 3 (three) times daily as needed. spasms      . diphenhydrAMINE (BENADRYL) 25 MG tablet Take 25 mg by mouth every 6 (six) hours as needed. For itching      . EPINEPHrine (EPIPEN) 0.3 mg/0.3 mL DEVI Inject 0.3 mg into the muscle once.      . ferrous sulfate 325 (65 FE) MG tablet Take 1 tablet (325 mg total) by mouth daily.  30 tablet  0  . FLUoxetine (PROZAC) 40 MG capsule Take 40 mg by mouth daily.      . furosemide (LASIX) 40 MG tablet Take 40 mg by mouth daily.      Marland Kitchen gabapentin (NEURONTIN) 300 MG capsule Take 900 mg by mouth 3 (three) times daily.      Marland Kitchen lisinopril (PRINIVIL,ZESTRIL) 5 MG tablet Take 5 mg by mouth daily. Hold for SBP <100      . polyethylene glycol (MIRALAX / GLYCOLAX) packet Take 17 g by mouth daily.      . traMADol (ULTRAM) 50 MG tablet Take 100 mg by mouth 3 (three) times daily as needed. For pain      . triamcinolone lotion (KENALOG) 0.1 % Apply 1 application topically 2 (two) times daily.      Marland Kitchen loperamide (IMODIUM) 2 MG capsule Take 2 mg by mouth daily as needed. For loose stool      . Menthol-Zinc Oxide 0.2-20 % PSTE Apply 1 application topically 3 (three) times daily. To buttocks & thighs       No current facility-administered  medications on file prior to visit.   No Known Allergies History   Social History  . Marital Status: Married    Spouse Name: N/A    Number of Children: N/A  . Years of Education: N/A   Occupational History  . Not on file.   Social History Main Topics  . Smoking status: Current Some Day Smoker -- 0.50 packs/day    Types: Cigarettes  . Smokeless tobacco: Not on file  . Alcohol Use: No  . Drug Use: No  . Sexually Active:    Other Topics Concern  . Not on file   Social History Narrative  . No narrative on file       Review of Systems     Objective:   Physical Exam BP 125/82  Temp(Src) 98.7 F (37.1 C)  Wt 402 lb (182.346 kg)  BMI 59.34 kg/m2 exam deferred- pt on stretcher    8/23/ 2013 TRANSABDOMINAL AND TRANSVAGINAL ULTRASOUND OF PELVIS  Technique: Both transabdominal and transvaginal ultrasound  examinations of the pelvis were performed.  Transabdominal technique  was performed for global imaging of the pelvis including uterus,  ovaries, adnexal regions, and pelvic cul-de-sac.  It was necessary to proceed with endovaginal exam following the  transabdominal exam to visualize the ovaries and endometrium.  Comparison: None  Findings:  Uterus: Measures 11.0 x 6.8 x 5.7 cm. No myometrial abnormalities  are demonstrated.  Endometrium: Normal in thickness measuring 12 mm.  Right ovary: Not visualized.  Left ovary: Not visualized.  Other findings: No free fluid  IMPRESSION:  1. Limited examination. The ovaries could not be visualized. No  obvious adnexal mass.  2. The uterus is normal.    10/3/2013Diagnosis Endometrium, biopsy POLYPOID FRAGMENTS OF WEAKLY PROLIFERATIVE ENDOMETRIUM WITH FOCAL SQUAMOUS METAPLASIA, NO ATYPIA, HYPERPLASIA OR MALIGNANCY     Assessment:     DUB- improved on Megace.  Now with spotting after stopping Megace     Plan:     Restart Meagce 40mg  q day F/u in 6 months TV sono prior to next visit in 6 months  Pt encouraged to keep  track of her meds

## 2013-02-18 ENCOUNTER — Encounter: Payer: Self-pay | Admitting: Adult Health

## 2013-02-18 ENCOUNTER — Non-Acute Institutional Stay (SKILLED_NURSING_FACILITY): Payer: Medicare Other | Admitting: Adult Health

## 2013-02-18 DIAGNOSIS — F32A Depression, unspecified: Secondary | ICD-10-CM

## 2013-02-18 DIAGNOSIS — K5900 Constipation, unspecified: Secondary | ICD-10-CM

## 2013-02-18 DIAGNOSIS — N950 Postmenopausal bleeding: Secondary | ICD-10-CM

## 2013-02-18 DIAGNOSIS — G8929 Other chronic pain: Secondary | ICD-10-CM

## 2013-02-18 DIAGNOSIS — R609 Edema, unspecified: Secondary | ICD-10-CM

## 2013-02-18 DIAGNOSIS — I10 Essential (primary) hypertension: Secondary | ICD-10-CM

## 2013-02-18 DIAGNOSIS — F329 Major depressive disorder, single episode, unspecified: Secondary | ICD-10-CM

## 2013-02-18 DIAGNOSIS — IMO0001 Reserved for inherently not codable concepts without codable children: Secondary | ICD-10-CM

## 2013-02-18 DIAGNOSIS — D649 Anemia, unspecified: Secondary | ICD-10-CM

## 2013-02-18 NOTE — Assessment & Plan Note (Signed)
Her blood pressure is stable is taking lisinopril 5 mg daily; she is also on this medication for her diabetes

## 2013-02-18 NOTE — Assessment & Plan Note (Signed)
She is stable is taking iron daily

## 2013-02-18 NOTE — Progress Notes (Signed)
Patient ID: Lauren Barber, female   DOB: Feb 07, 1956, 57 y.o.   MRN: [id number]  Chief Complaint  Patient presents with  . Medical Managment of Chronic Issues    HPI:  HYPERTENSION Her blood pressure is stable is taking lisinopril 5 mg daily; she is also on this medication for her diabetes  Anemia She is stable is taking iron daily   Chronic pain Her pain is being adequately managed is taking flexeril 10 mg three times daily as needed; is taking neurontin 900 mg three times daily; takes ultram 100 mg three times daily as needed   Post-menopausal bleeding She is stable has been seen by gyn services; megace 40 mg daily through 08-09-13.   Diabetes mellitus type 2, uncontrolled, without complications Her cbg's are slowly improving is taking metformin er 500 mg daily takes lantus 40 units daily takes novolog 7 units prior to meals with an additional 5 units for a cbg >=150  Unspecified constipation Is stable is taking miralax daily   Depression Is stable is taking prozac 60 mg daily takes wellbutrin 75 mg daily   Edema Is stable is taking lasix 40 mg daily     Past Medical History  Diagnosis Date  . Hypertension   . Obesities, morbid   . Stroke   . CVA (cerebral vascular accident)   . Vaginal bleeding     Past Surgical History  Procedure Laterality Date  . Cesarean section    . Biopsy endometrial N/A 10 03 2013    VITAL SIGNS BP 134/87  Pulse 83  Ht 5\' 9"  (1.753 m)  Wt 382 lb (173.274 kg)  BMI 56.39 kg/m2   Patient's Medications  New Prescriptions   No medications on file  Previous Medications   ACETAMINOPHEN (TYLENOL) 325 MG TABLET    Take 650 mg by mouth every 4 (four) hours as needed. For pain   BUPROPION (WELLBUTRIN) 75 MG TABLET    Take 75 mg by mouth daily.    CYCLOBENZAPRINE (FLEXERIL) 10 MG TABLET    Take 10 mg by mouth 3 (three) times daily as needed. spasms   DIPHENHYDRAMINE (BENADRYL) 25 MG TABLET    Take 25 mg by mouth every 6 (six) hours as  needed. For itching   EPINEPHRINE (EPIPEN) 0.3 MG/0.3 ML DEVI    Inject 0.3 mg into the muscle once.   FERROUS SULFATE 325 (65 FE) MG TABLET    Take 1 tablet (325 mg total) by mouth daily.   FLUOXETINE (PROZAC) 40 MG CAPSULE    Take 40 mg by mouth daily.   FUROSEMIDE (LASIX) 40 MG TABLET    Take 40 mg by mouth daily.   GABAPENTIN (NEURONTIN) 300 MG CAPSULE    Take 900 mg by mouth 3 (three) times daily.   INSULIN ASPART (NOVOLOG) 100 UNIT/ML INJECTION    Inject 7 Units into the skin 3 (three) times daily before meals. With an additional 5 units prior to meals for cbg >=150   INSULIN ASPART (NOVOLOG) 100 UNIT/ML INJECTION    Inject into the skin 3 (three) times daily before meals.   INSULIN GLARGINE (LANTUS) 100 UNIT/ML INJECTION    Inject 40 Units into the skin at bedtime.   LISINOPRIL (PRINIVIL,ZESTRIL) 5 MG TABLET    Take 5 mg by mouth daily. Hold for SBP <100   LOPERAMIDE (IMODIUM) 2 MG CAPSULE    Take 2 mg by mouth daily as needed. For loose stool   MEGESTROL (MEGACE) 40 MG TABLET  Take 1 tablet (40 mg total) by mouth daily.   MENTHOL-ZINC OXIDE 0.2-20 % PSTE    Apply 1 application topically 3 (three) times daily. To buttocks & thighs   METFORMIN (GLUMETZA) 500 MG (MOD) 24 HR TABLET    Take 500 mg by mouth daily with breakfast.   POLYETHYLENE GLYCOL (MIRALAX / GLYCOLAX) PACKET    Take 17 g by mouth daily.   TRAMADOL (ULTRAM) 50 MG TABLET    Take 100 mg by mouth 3 (three) times daily as needed. For pain   TRIAMCINOLONE LOTION (KENALOG) 0.1 %    Apply 1 application topically 2 (two) times daily.  Modified Medications   No medications on file  Discontinued Medications   No medications on file    SIGNIFICANT DIAGNOSTIC EXAMS  01-19-13:  Right lower extremity doppler: neg for dvt   Lab reviewed: 11-25-12: wbc 14.9; hgb 8.8; hct 27.7; mcv 87.4; plt 313; glucose 494; bun 19; creat 1.65; k+ 4.0 Na++124; alk phos 96; ast 10; alt 15; albumin 3.2  11-26-12: tsh 3.006 11-28-12: hgb a1c 13.6   12-18-12: glucose 144; bun 13; creat 1.31; k+ 4.0; na++ 137 12-29-12: wbc 4.6; hgb 8.0.; hct 24.3; mcv 84.1. plt 282;   Review of Systems  Constitutional: Negative.   HENT: Negative for neck pain.   Respiratory: Negative for cough and shortness of breath.   Cardiovascular: Negative for chest pain and leg swelling.  Gastrointestinal: Negative for heartburn, abdominal pain and constipation.  Musculoskeletal: Negative for joint pain.  Skin: Negative for itching and rash.  Neurological: Negative for headaches.  Psychiatric/Behavioral: Negative for depression. The patient does not have insomnia.     Physical Exam  Constitutional: She is oriented to person, place, and time. She appears well-developed and well-nourished.  Morbidly obese  Neck: Neck supple.  Cardiovascular: Normal rate, regular rhythm and intact distal pulses.   Respiratory: Effort normal and breath sounds normal.  GI: Soft. Bowel sounds are normal.  Musculoskeletal: She exhibits no edema.  Is able to move extremities; has limited range of motion due to obesity   Neurological: She is alert and oriented to person, place, and time.  Skin: Skin is warm and dry.  Psychiatric: She has a normal mood and affect.    ASSESSMENT/ PLAN:  Diabetes; hypertension; anemia; chronic pain; post-menopausal bleeding; edema; constipation; depression  FUTURE ORDERS:  Will not make any changes at this time to her current regimen; will check a hgb a1c on the next blood draw and will continue to monitor her status

## 2013-02-18 NOTE — Assessment & Plan Note (Signed)
She is stable has been seen by gyn services; megace 40 mg daily through 08-09-13.

## 2013-02-18 NOTE — Assessment & Plan Note (Signed)
Is stable is taking lasix 40 mg daily  

## 2013-02-18 NOTE — Assessment & Plan Note (Signed)
Is stable is taking prozac 60 mg daily takes wellbutrin 75 mg daily

## 2013-02-18 NOTE — Assessment & Plan Note (Signed)
Her pain is being adequately managed is taking flexeril 10 mg three times daily as needed; is taking neurontin 900 mg three times daily; takes ultram 100 mg three times daily as needed

## 2013-02-18 NOTE — Assessment & Plan Note (Signed)
Is stable is taking miralax daily  

## 2013-02-18 NOTE — Assessment & Plan Note (Signed)
Her cbg's are slowly improving is taking metformin er 500 mg daily takes lantus 40 units daily takes novolog 7 units prior to meals with an additional 5 units for a cbg >=150

## 2013-02-19 ENCOUNTER — Encounter: Payer: Self-pay | Admitting: *Deleted

## 2013-04-02 ENCOUNTER — Non-Acute Institutional Stay (SKILLED_NURSING_FACILITY): Payer: Medicare Other | Admitting: Internal Medicine

## 2013-04-02 DIAGNOSIS — I10 Essential (primary) hypertension: Secondary | ICD-10-CM

## 2013-04-02 DIAGNOSIS — M79609 Pain in unspecified limb: Secondary | ICD-10-CM

## 2013-04-09 ENCOUNTER — Other Ambulatory Visit: Payer: Self-pay | Admitting: *Deleted

## 2013-04-09 MED ORDER — OXYCODONE HCL 5 MG PO TABS: ORAL_TABLET | ORAL | Status: DC

## 2013-04-13 DIAGNOSIS — M79676 Pain in unspecified toe(s): Secondary | ICD-10-CM | POA: Insufficient documentation

## 2013-04-13 NOTE — Progress Notes (Signed)
Patient ID: Lauren Barber, female   DOB: March 15, 1956, 57 y.o.   MRN: [id number]  CC-pain in bilateral great toes  No Known Allergies  HPI- She recently underwent removal of ingrown toe nails by podiatry and has been having pain in both her toes since then. She is on tylenol prn with tramadol 200 mg tid prn and this is not helping her. No drainage from site of ingrown toe nail but has intermittent throbbing pain. No fever or chills. Appetite is good. No nausea or vomiting. No bowel/ bladder complaints Patient denies any other complaints Rest of ROS negative  Past Medical History  Diagnosis Date  . Hypertension   . Obesities, morbid   . Stroke   . CVA (cerebral vascular accident)   . Vaginal bleeding     BP 127/77  Pulse 88  Temp(Src) 97.7 F (36.5 C)  Resp 16  Ht 5\' 9"  (1.753 m)  Wt 384 lb (174.181 kg)  BMI 56.68 kg/m2  SpO2 98%  gen- adult female in NAD, morbidly obese heent- no pallor, no icterus cvs- normal s1,s2, rrr respi- b/l CTA Ext- b/l great toe has dressing post ingrown toenail removal, dressing clean and dry, tenderness present  Neuro aaox3,sensation present  Assessment/plan-  Toe pain- post ingrown toe nial removal, no signs of infection at present. Has dressing in place. Will add oxycodone 5 mg q8h prn for pain for a week along with tramadol and reassess. Pt has been taking neurontin for neuropathic pain  htn- bp under control. Continue current bp medications

## 2013-04-28 ENCOUNTER — Non-Acute Institutional Stay (SKILLED_NURSING_FACILITY): Payer: Medicare Other | Admitting: Internal Medicine

## 2013-04-28 DIAGNOSIS — L02419 Cutaneous abscess of limb, unspecified: Secondary | ICD-10-CM

## 2013-04-28 DIAGNOSIS — L03119 Cellulitis of unspecified part of limb: Secondary | ICD-10-CM

## 2013-04-28 DIAGNOSIS — R609 Edema, unspecified: Secondary | ICD-10-CM

## 2013-04-28 DIAGNOSIS — L03115 Cellulitis of right lower limb: Secondary | ICD-10-CM

## 2013-04-28 DIAGNOSIS — IMO0001 Reserved for inherently not codable concepts without codable children: Secondary | ICD-10-CM

## 2013-04-28 NOTE — Progress Notes (Signed)
Patient ID: Lauren Barber, female   DOB: Dec 31, 1955, 57 y.o.   MRN: [id number] [facility] Chief Complaint: right leg pain  HPI: 57 yo morbidly obese AA female who has been here for long term care (now is looking for an apt) has developed RLE cellulitis. She is still smoking at times.  She is on keflex since the weekend.  She is improving.  She is not on florastor.    Review of Systems:  Review of Systems  Constitutional: Negative for fever and chills.  Cardiovascular: Positive for leg swelling.  Skin:       Redness and swelling present in right leg, but outlines indicate improvement    Medications: Patient's Medications  New Prescriptions   No medications on file  Previous Medications   ACETAMINOPHEN (TYLENOL) 325 MG TABLET    Take 650 mg by mouth every 4 (four) hours as needed. For pain   BUPROPION (WELLBUTRIN) 75 MG TABLET    Take 75 mg by mouth daily.    CYCLOBENZAPRINE (FLEXERIL) 10 MG TABLET    Take 10 mg by mouth 3 (three) times daily as needed. spasms   DIPHENHYDRAMINE (BENADRYL) 25 MG TABLET    Take 25 mg by mouth every 6 (six) hours as needed. For itching   EPINEPHRINE (EPIPEN) 0.3 MG/0.3 ML DEVI    Inject 0.3 mg into the muscle once.   FERROUS SULFATE 325 (65 FE) MG TABLET    Take 1 tablet (325 mg total) by mouth daily.   FLUOXETINE (PROZAC) 40 MG CAPSULE    Take 40 mg by mouth daily.   FUROSEMIDE (LASIX) 40 MG TABLET    Take 40 mg by mouth daily.   GABAPENTIN (NEURONTIN) 300 MG CAPSULE    Take 900 mg by mouth 3 (three) times daily.   INSULIN ASPART (NOVOLOG) 100 UNIT/ML INJECTION    Inject 7 Units into the skin 3 (three) times daily before meals. With an additional 5 units prior to meals for cbg >=150   INSULIN ASPART (NOVOLOG) 100 UNIT/ML INJECTION    Inject into the skin 3 (three) times daily before meals.   INSULIN GLARGINE (LANTUS) 100 UNIT/ML INJECTION    Inject 40 Units into the skin at bedtime.   LISINOPRIL (PRINIVIL,ZESTRIL) 5 MG TABLET    Take 5  mg by mouth daily. Hold for SBP <100   LOPERAMIDE (IMODIUM) 2 MG CAPSULE    Take 2 mg by mouth daily as needed. For loose stool   MEGESTROL (MEGACE) 40 MG TABLET    Take 1 tablet (40 mg total) by mouth daily.   MENTHOL-ZINC OXIDE 0.2-20 % PSTE    Apply 1 application topically 3 (three) times daily. To buttocks & thighs   METFORMIN (GLUMETZA) 500 MG (MOD) 24 HR TABLET    Take 500 mg by mouth daily with breakfast.   OXYCODONE (OXY IR/ROXICODONE) 5 MG IMMEDIATE RELEASE TABLET    Take 1 tablet every 8 hours as needed for pain   POLYETHYLENE GLYCOL (MIRALAX / GLYCOLAX) PACKET    Take 17 g by mouth daily.   TRAMADOL (ULTRAM) 50 MG TABLET    Take 100 mg by mouth 3 (three) times daily as needed. For pain   TRIAMCINOLONE LOTION (KENALOG) 0.1 %    Apply 1 application topically 2 (two) times daily.  Modified Medications   No medications on file  Discontinued Medications   No medications on file     Physical Exam:  Filed Vitals:   04/28/13 0931  BP: 134/81  Pulse: 103  Temp: 98.1 F (36.7 C)  Resp: 18  Height: 5\' 9"  (1.753 m)  Weight: 363 lb (164.656 kg)  SpO2: 98%  Physical Exam  Constitutional: She appears well-developed and well-nourished. No distress.  Morbidly obese  HENT:  Head: Normocephalic and atraumatic.  Cardiovascular: Normal rate, regular rhythm, normal heart sounds and intact distal pulses.   Pulmonary/Chest: Effort normal and breath sounds normal. No respiratory distress.  Abdominal: Soft. Bowel sounds are normal.  Skin: Skin is warm and dry. There is erythema.  Swelling and pain of RLE     Labs reviewed: Basic Metabolic Panel:  Recent Labs  16/10/96 1831 07/11/12 1914 08/01/12 1405  NA 138 135 135  K 5.2* 5.0 5.2*  CL 102 98 101  CO2  --  27 23  GLUCOSE 95 125* 107*  BUN 32* 30* 22  CREATININE 1.20* 1.25* 1.08  CALCIUM  --  10.1 9.5   CBC:  Recent Labs  07/11/12 1831 08/01/12 1405  WBC  --  10.2  NEUTROABS  --  7.0  HGB 11.9* 8.5*  HCT 35.0*  26.7*  MCV  --  90.8  PLT  --  446*   Assessment/Plan: 1. Diabetes mellitus type 2, uncontrolled, without complications -cont to monitor cbgs, doing better with diet, eager to find apt  2. Cellulitis of leg, right -complete keflex therapy -add florastor  3. Edema -worse with current cellulitis -cont low sodium diet and elevating legs

## 2013-05-15 ENCOUNTER — Non-Acute Institutional Stay (SKILLED_NURSING_FACILITY): Payer: Medicare Other | Admitting: Internal Medicine

## 2013-05-15 DIAGNOSIS — IMO0001 Reserved for inherently not codable concepts without codable children: Secondary | ICD-10-CM

## 2013-05-15 DIAGNOSIS — M25473 Effusion, unspecified ankle: Secondary | ICD-10-CM

## 2013-05-15 DIAGNOSIS — M25471 Effusion, right ankle: Secondary | ICD-10-CM

## 2013-05-15 DIAGNOSIS — R609 Edema, unspecified: Secondary | ICD-10-CM

## 2013-05-15 DIAGNOSIS — R600 Localized edema: Secondary | ICD-10-CM

## 2013-05-15 DIAGNOSIS — M25476 Effusion, unspecified foot: Secondary | ICD-10-CM

## 2013-05-15 NOTE — Progress Notes (Signed)
Patient ID: Lauren Barber, female   DOB: May 28, 1956, 57 y.o.   MRN: [id number]  Lauren Barber living [place]  No Known Allergies  Chief Complaint: AV- ankle pain, elevated blood sugar  HPI:  57 y/o morbidly obese female patient is seen today with complaints of pain in her ankle and persistence of redness with swelling in right leg. She is on keflex at present for cellulitis since 04/25/13. As per staff, her sugars have been running between 100-200 mostly.   Review of Systems:  Denies fever or chills Denies nausea and vomiting Denies abdominal pain Denies swelling in other joints Denies bowel/ bladder complaints Denies chest pain and shortness of breath Denies leg cramps  Past Medical History  Diagnosis Date  . Hypertension   . Obesities, morbid   . Stroke   . CVA (cerebral vascular accident)   . Vaginal bleeding    Past Surgical History  Procedure Laterality Date  . Cesarean section    . Biopsy endometrial N/A 10 03 2013   Social History:   reports that she has been smoking Cigarettes.  She has been smoking about 0.50 packs per day. She does not have any smokeless tobacco history on file. She reports that she does not drink alcohol or use illicit drugs.  Family History  Problem Relation Age of Onset  . Family history unknown: Yes    Medications: Patient's Medications  New Prescriptions   No medications on file  Previous Medications   BUPROPION (WELLBUTRIN) 75 MG TABLET    Take 75 mg by mouth daily.    CEPHALEXIN (KEFLEX) 500 MG CAPSULE    Take 500 mg by mouth 2 (two) times daily.   CYCLOBENZAPRINE (FLEXERIL) 10 MG TABLET    Take 10 mg by mouth 3 (three) times daily as needed. spasms   DIPHENHYDRAMINE (BENADRYL) 25 MG TABLET    Take 25 mg by mouth every 8 (eight) hours as needed for itching. For itching   EPINEPHRINE (EPIPEN) 0.3 MG/0.3 ML DEVI    Inject 0.3 mg into the muscle once.   FERROUS SULFATE 325 (65 FE) MG TABLET    Take 1 tablet (325 mg total) by mouth daily.   FLUOXETINE (PROZAC) 40 MG CAPSULE    Take 60 mg by mouth daily.    FUROSEMIDE (LASIX) 40 MG TABLET    Take 40 mg by mouth daily.   GABAPENTIN (NEURONTIN) 300 MG CAPSULE    Take 900 mg by mouth 3 (three) times daily.   INSULIN ASPART (NOVOLOG) 100 UNIT/ML INJECTION    Inject 7 Units into the skin 3 (three) times daily before meals. With an additional 7 units prior to meals for cbg >=150   INSULIN DETEMIR (LEVEMIR) 100 UNIT/ML INJECTION    Inject 40 Units into the skin at bedtime.   LISINOPRIL (PRINIVIL,ZESTRIL) 5 MG TABLET    Take 5 mg by mouth daily. Hold for SBP <100   MEGESTROL (MEGACE) 40 MG TABLET    Take 1 tablet (40 mg total) by mouth daily.   MENTHOL-ZINC OXIDE 0.2-20 % PSTE    Apply 1 application topically 3 (three) times daily. To buttocks & thighs   METFORMIN (GLUMETZA) 500 MG (MOD) 24 HR TABLET    Take 500 mg by mouth daily with breakfast.   OLOPATADINE HCL (PATADAY) 0.2 % SOLN    Apply 1 drop to eye daily. Both eyes   POLYETHYLENE GLYCOL (MIRALAX / GLYCOLAX) PACKET    Take 17 g by mouth daily.   SACCHAROMYCES BOULARDII (FLORASTOR)  250 MG CAPSULE    Take 250 mg by mouth 2 (two) times daily.   TRAMADOL (ULTRAM) 50 MG TABLET    Take 200 mg by mouth every 12 (twelve) hours. For pain   TRIAMCINOLONE LOTION (KENALOG) 0.1 %    Apply 1 application topically daily as needed.   Modified Medications   No medications on file  Discontinued Medications   ACETAMINOPHEN (TYLENOL) 325 MG TABLET    Take 650 mg by mouth every 4 (four) hours as needed. For pain   INSULIN ASPART (NOVOLOG) 100 UNIT/ML INJECTION    Inject into the skin 3 (three) times daily before meals.   INSULIN GLARGINE (LANTUS) 100 UNIT/ML INJECTION    Inject 40 Units into the skin at bedtime.   LOPERAMIDE (IMODIUM) 2 MG CAPSULE    Take 2 mg by mouth daily as needed. For loose stool   OXYCODONE (OXY IR/ROXICODONE) 5 MG IMMEDIATE RELEASE TABLET    Take 1 tablet every 8 hours as needed for pain    Physical Exam:  Filed Vitals:    05/15/13 1225  BP: 123/73  Pulse: 78  Temp: 98.2 F (36.8 C)  Resp: 18  Height: 5\' 9"  (1.753 m)  Weight: 363 lb (164.656 kg)  SpO2: 98%   Constitutional: She is oriented to person, place, and time. She appears well-developed and well-nourished.  Morbidly obese  Neck: Neck supple.  Cardiovascular: Normal rate, regular rhythm and intact distal pulses.   Respiratory: Effort normal and breath sounds normal.  GI: Soft. Bowel sounds are normal.  Neurological: She is alert and oriented to person, place, and time.  Skin: Skin is warm and dry.  Psychiatric: She has a normal mood and affect.  Musculoskeletal: Right foot , ankle and leg swollen with erythema and tenderness around ankle joint. No open sores/ wounds. Distal pulses palpable.    Labs reviewed: Basic Metabolic Panel:  Recent Labs  40/98/11 1831 07/11/12 1914 08/01/12 1405  NA 138 135 135  K 5.2* 5.0 5.2*  CL 102 98 101  CO2  --  27 23  GLUCOSE 95 125* 107*  BUN 32* 30* 22  CREATININE 1.20* 1.25* 1.08  CALCIUM  --  10.1 9.5   CBC:  Recent Labs  07/11/12 1831 08/01/12 1405  WBC  --  10.2  NEUTROABS  --  7.0  HGB 11.9* 8.5*  HCT 35.0* 26.7*  MCV  --  90.8  PLT  --  446*   2/14 Hb 8, hct 24.3, plt 282, wbc 4.6  1/14  na 137, k 4, bun 13, cr 1.31, ca 8.6, glu 144  Assessment/Plan  DM- cbg ranging 103-200 mostly with one reading above 200. Will change novolog to 7 u only pre meal and d/c additional 7 u for cbg > 150. Will check a1c and if elevated, consider adjusting dose of levemir. Continue current regimen of levemir and metformin. We are adding prednsione at present and thus will add 5 u novolog premeal for cbg > 200 only for a week  Right leg edmea- d/c keflex given no improvement despite being on it since 04/25/13 for presumed cellulitis. Get venous doppler RLE to rule out DVT given asymmetrical swelling in morbidly obses female who is wheelchair bound. With her right ankle joint and foot are swollen. Will  check uric acid level to assess for gout attack. Treat her empirically with a course of prednisone 50 mg po daily for 5 days. No open sores/ wounds so osteomyelitis or abscess less likely  at present. No crepitus on exam. No gangrenous findings. If has elevated uric acid level, consider chronic gout management   Right ankle swelling- see above   Family/ staff Communication: reviewed with patient and nursing supervisor   Labs/tests ordered- a1c, cbc with diff, uric acid, doppler RLE

## 2013-05-19 ENCOUNTER — Non-Acute Institutional Stay (SKILLED_NURSING_FACILITY): Payer: Medicare Other | Admitting: Internal Medicine

## 2013-05-19 DIAGNOSIS — D62 Acute posthemorrhagic anemia: Secondary | ICD-10-CM

## 2013-05-19 DIAGNOSIS — E1149 Type 2 diabetes mellitus with other diabetic neurological complication: Secondary | ICD-10-CM

## 2013-05-19 DIAGNOSIS — M109 Gout, unspecified: Secondary | ICD-10-CM

## 2013-05-19 MED ORDER — PREDNISONE 10 MG PO TABS: 10.0000 mg | ORAL_TABLET | Freq: Every day | ORAL | Status: AC

## 2013-05-19 MED ORDER — ALLOPURINOL 100 MG PO TABS: 100.0000 mg | ORAL_TABLET | Freq: Every day | ORAL | Status: DC

## 2013-05-19 MED ORDER — COLCHICINE 0.6 MG PO TABS
0.6000 mg | ORAL_TABLET | Freq: Every day | ORAL | Status: DC
Start: 2013-05-19 — End: 2014-01-08

## 2013-05-19 NOTE — Progress Notes (Signed)
Patient ID: Lauren Barber, female   DOB: [birth year], 57 y.o.   MRN: [id number] Location:  [facility] Provider:  Gwenith Spitz. Renato Gails, D.O., C.M.D.  Chief Complaint: AV:  Elevated uric acid HPI:  57 yo female seen for acute visit due to elevated uric acid and gouty flare involving great toe Review of Systems:  Review of Systems  Constitutional: Negative for fever.  Respiratory: Negative for shortness of breath.   Cardiovascular: Positive for leg swelling. Negative for chest pain.  Musculoskeletal: Negative for falls.       Toe pain, redness, swelling  Skin: Negative for itching and rash.  Neurological: Negative for dizziness and headaches.  Psychiatric/Behavioral: Positive for depression.     Medications: Patient's Medications  New Prescriptions   No medications on file  Previous Medications   BUPROPION (WELLBUTRIN) 75 MG TABLET    Take 75 mg by mouth daily.    CEPHALEXIN (KEFLEX) 500 MG CAPSULE    Take 500 mg by mouth 2 (two) times daily.   CYCLOBENZAPRINE (FLEXERIL) 10 MG TABLET    Take 10 mg by mouth 3 (three) times daily as needed. spasms   DIPHENHYDRAMINE (BENADRYL) 25 MG TABLET    Take 25 mg by mouth every 8 (eight) hours as needed for itching. For itching   EPINEPHRINE (EPIPEN) 0.3 MG/0.3 ML DEVI    Inject 0.3 mg into the muscle once.   FERROUS SULFATE 325 (65 FE) MG TABLET    Take 1 tablet (325 mg total) by mouth daily.   FLUOXETINE (PROZAC) 40 MG CAPSULE    Take 60 mg by mouth daily.    FUROSEMIDE (LASIX) 40 MG TABLET    Take 40 mg by mouth daily.   GABAPENTIN (NEURONTIN) 300 MG CAPSULE    Take 900 mg by mouth 3 (three) times daily.   INSULIN ASPART (NOVOLOG) 100 UNIT/ML INJECTION    Inject 7 Units into the skin 3 (three) times daily before meals. With an additional 7 units prior to meals for cbg >=150   INSULIN DETEMIR (LEVEMIR) 100 UNIT/ML INJECTION    Inject 40 Units into the skin at bedtime.   LISINOPRIL (PRINIVIL,ZESTRIL) 5 MG TABLET    Take 5 mg by mouth  daily. Hold for SBP <100   MEGESTROL (MEGACE) 40 MG TABLET    Take 1 tablet (40 mg total) by mouth daily.   MENTHOL-ZINC OXIDE 0.2-20 % PSTE    Apply 1 application topically 3 (three) times daily. To buttocks & thighs   METFORMIN (GLUMETZA) 500 MG (MOD) 24 HR TABLET    Take 500 mg by mouth daily with breakfast.   OLOPATADINE HCL (PATADAY) 0.2 % SOLN    Apply 1 drop to eye daily. Both eyes   POLYETHYLENE GLYCOL (MIRALAX / GLYCOLAX) PACKET    Take 17 g by mouth daily.   SACCHAROMYCES BOULARDII (FLORASTOR) 250 MG CAPSULE    Take 250 mg by mouth 2 (two) times daily.   TRAMADOL (ULTRAM) 50 MG TABLET    Take 200 mg by mouth every 12 (twelve) hours. For pain   TRIAMCINOLONE LOTION (KENALOG) 0.1 %    Apply 1 application topically daily as needed.   Modified Medications   No medications on file  Discontinued Medications   No medications on file    Physical Exam:  Physical Exam  Vitals reviewed. Constitutional: She is oriented to person, place, and time. She appears well-developed and well-nourished.  Morbidly obese black female  HENT:  Head: Normocephalic and atraumatic.  Neck: Neck supple.  Cardiovascular: Normal rate, regular rhythm, normal heart sounds and intact distal pulses.   Pulmonary/Chest: Effort normal and breath sounds normal. No respiratory distress.  Neurological: She is alert and oriented to person, place, and time.  Skin: Skin is warm. There is erythema.  Great toe, tenderness, swelling     Labs reviewed: Basic Metabolic Panel:  Recent Labs  40/98/11 1831 07/11/12 1914 08/01/12 1405  NA 138 135 135  K 5.2* 5.0 5.2*  CL 102 98 101  CO2  --  27 23  GLUCOSE 95 125* 107*  BUN 32* 30* 22  CREATININE 1.20* 1.25* 1.08  CALCIUM  --  10.1 9.5    CBC:  Recent Labs  07/11/12 1831 08/01/12 1405  WBC  --  10.2  NEUTROABS  --  7.0  HGB 11.9* 8.5*  HCT 35.0* 26.7*  MCV  --  90.8  PLT  --  446*  05/18/13: Uric acid 10.3 Wbc 11.2, h/h 9.2/27.6, plts 434 hba1c  6.6  Assessment/Plan Acute gouty arthritis:  Was started on prednisone 50mg  daily for 5 days last week--finishes 7/3. This is why she now has leukocytosis.  Her uric acid was 10.3 so I started her on colcrys for 6 mos, and to begin allopurinol in 1 week to prevent recurrence.  F/u uric acid level in 4 wks and f/u bmp in 1 week.   Needs both colcrys and allopurinol.  Will need allopurinol forever.   Diabetes mellitus II with neurologic complications, uncontrolled:  Is now controlled with current insulin regimen with hba1c now 6.6 down from 13.6 on 11/28/12.    Anemia:  Acute blood loss:  Hgb improved with iron therapy to 9.2 from 8 in February 2014

## 2013-05-30 DIAGNOSIS — R600 Localized edema: Secondary | ICD-10-CM | POA: Insufficient documentation

## 2013-05-30 DIAGNOSIS — M25471 Effusion, right ankle: Secondary | ICD-10-CM | POA: Insufficient documentation

## 2013-07-03 ENCOUNTER — Non-Acute Institutional Stay (SKILLED_NURSING_FACILITY): Payer: Medicare Other | Admitting: Internal Medicine

## 2013-07-03 DIAGNOSIS — Z0000 Encounter for general adult medical examination without abnormal findings: Secondary | ICD-10-CM | POA: Insufficient documentation

## 2013-07-03 DIAGNOSIS — R609 Edema, unspecified: Secondary | ICD-10-CM

## 2013-07-03 DIAGNOSIS — M109 Gout, unspecified: Secondary | ICD-10-CM | POA: Insufficient documentation

## 2013-07-03 DIAGNOSIS — G8929 Other chronic pain: Secondary | ICD-10-CM

## 2013-07-03 DIAGNOSIS — F329 Major depressive disorder, single episode, unspecified: Secondary | ICD-10-CM

## 2013-07-03 DIAGNOSIS — N950 Postmenopausal bleeding: Secondary | ICD-10-CM

## 2013-07-03 DIAGNOSIS — K5900 Constipation, unspecified: Secondary | ICD-10-CM

## 2013-07-03 DIAGNOSIS — D649 Anemia, unspecified: Secondary | ICD-10-CM

## 2013-07-03 DIAGNOSIS — I10 Essential (primary) hypertension: Secondary | ICD-10-CM

## 2013-07-03 NOTE — Progress Notes (Signed)
Patient ID: Lauren Barber, female   DOB: November 21, 1955, 57 y.o.   MRN: [id number]  Lauren Barber living [place]  Code Status: full code  No Known Allergies  Chief Complaint  Patient presents with  . Annual Exam    HPI:  57 y/o female  Patient is a resident here and is looking for an apartment at present. She was seen in her room today. She is comfortable and denies any complaints. She gets OOB. No concerns from staff. Gout has remained stable  Review of Systems  Constitutional: Negative for fever, chills, weight loss, malaise/fatigue and diaphoresis.  HENT: Negative for hearing loss and congestion.   Eyes: Negative for blurred vision.  Respiratory: Negative for cough, sputum production and shortness of breath.   Cardiovascular: Negative for chest pain, palpitations and orthopnea.  Gastrointestinal: Negative for heartburn, nausea, vomiting, abdominal pain, diarrhea and constipation.  Genitourinary: Negative for dysuria and frequency.  Musculoskeletal: Negative for myalgias, joint pain and falls.  Skin: Negative for itching and rash.  Neurological: Negative for dizziness, focal weakness, seizures, loss of consciousness, weakness and headaches.  Psychiatric/Behavioral: Negative for depression, suicidal ideas and memory loss. The patient does not have insomnia.      Past Medical History  Diagnosis Date  . Hypertension   . Obesities, morbid   . Stroke   . CVA (cerebral vascular accident)   . Vaginal bleeding    Past Surgical History  Procedure Laterality Date  . Cesarean section    . Biopsy endometrial N/A 10 03 2013   Social History:   reports that she has been smoking Cigarettes.  She has been smoking about 0.50 packs per day. She does not have any smokeless tobacco history on file. She reports that she does not drink alcohol or use illicit drugs.  Family History  Problem Relation Age of Onset  . Family history unknown: Yes    Medications: Patient's Medications  New Prescriptions    No medications on file  Previous Medications   ALLOPURINOL (ZYLOPRIM) 100 MG TABLET    Take 1 tablet (100 mg total) by mouth daily.   BUPROPION (WELLBUTRIN) 75 MG TABLET    Take 75 mg by mouth daily.    COLCHICINE 0.6 MG TABLET    Take 1 tablet (0.6 mg total) by mouth daily. 2 tabs now, then one tab daily for 6 months.   CYCLOBENZAPRINE (FLEXERIL) 10 MG TABLET    Take 10 mg by mouth 3 (three) times daily as needed. spasms   DIPHENHYDRAMINE (BENADRYL) 25 MG TABLET    Take 25 mg by mouth every 8 (eight) hours as needed for itching. For itching   EPINEPHRINE (EPIPEN) 0.3 MG/0.3 ML DEVI    Inject 0.3 mg into the muscle once.   FERROUS SULFATE 325 (65 FE) MG TABLET    Take 1 tablet (325 mg total) by mouth daily.   FLUOXETINE (PROZAC) 40 MG CAPSULE    Take 60 mg by mouth daily.    FUROSEMIDE (LASIX) 40 MG TABLET    Take 40 mg by mouth daily.   GABAPENTIN (NEURONTIN) 300 MG CAPSULE    Take 900 mg by mouth 3 (three) times daily.   INSULIN ASPART (NOVOLOG) 100 UNIT/ML INJECTION    Inject 7 Units into the skin 3 (three) times daily before meals. With an additional 7 units prior to meals for cbg >=150   INSULIN DETEMIR (LEVEMIR) 100 UNIT/ML INJECTION    Inject 40 Units into the skin at bedtime.   LISINOPRIL (PRINIVIL,ZESTRIL) 5  MG TABLET    Take 5 mg by mouth daily. Hold for SBP <100   MEGESTROL (MEGACE) 40 MG TABLET    Take 1 tablet (40 mg total) by mouth daily.   METFORMIN (GLUMETZA) 500 MG (MOD) 24 HR TABLET    Take 500 mg by mouth daily with breakfast.   OLOPATADINE HCL (PATADAY) 0.2 % SOLN    Apply 1 drop to eye daily. Both eyes   POLYETHYLENE GLYCOL (MIRALAX / GLYCOLAX) PACKET    Take 17 g by mouth daily.   TRAMADOL (ULTRAM) 50 MG TABLET    Take 200 mg by mouth every 12 (twelve) hours. For pain   TRIAMCINOLONE LOTION (KENALOG) 0.1 %    Apply 1 application topically daily as needed.   Modified Medications   No medications on file  Discontinued Medications   MENTHOL-ZINC OXIDE 0.2-20 % PSTE     Apply 1 application topically 3 (three) times daily. To buttocks & thighs   SACCHAROMYCES BOULARDII (FLORASTOR) 250 MG CAPSULE    Take 250 mg by mouth 2 (two) times daily.     Physical Exam:  Filed Vitals:   07/03/13 1417  BP: 130/84  Pulse: 88  Temp: 97.5 F (36.4 C)  Resp: 18  Height: 5\' 9"  (1.753 m)  Weight: 357 lb (161.934 kg)  SpO2: 98%   Constitutional: She is oriented to person, place, and time. She appears well-developed and well-nourished.  Morbidly obese  Neck: Neck supple.  Cardiovascular: Normal rate, regular rhythm and intact distal pulses.   Respiratory: Effort normal and breath sounds normal.  GI: Soft. Bowel sounds are normal.  Musculoskeletal: She exhibits no edema.  Is able to move extremities; has limited range of motion due to obesity   Neurological: She is alert and oriented to person, place, and time.  Skin: Skin is warm and dry.  Psychiatric: She has a normal mood and affect.     Labs reviewed: Basic Metabolic Panel:  Recent Labs  16/10/96 1831 07/11/12 1914 08/01/12 1405  NA 138 135 135  K 5.2* 5.0 5.2*  CL 102 98 101  CO2  --  27 23  GLUCOSE 95 125* 107*  BUN 32* 30* 22  CREATININE 1.20* 1.25* 1.08  CALCIUM  --  10.1 9.5   CBC:  Recent Labs  07/11/12 1831 08/01/12 1405  WBC  --  10.2  NEUTROABS  --  7.0  HGB 11.9* 8.5*  HCT 35.0* 26.7*  MCV  --  90.8  PLT  --  446*   SIGNIFICANT DIAGNOSTIC EXAMS  01-19-13:  Right lower extremity doppler: neg for dvt   Lab reviewed: 11-25-12: wbc 14.9; hgb 8.8; hct 27.7; mcv 87.4; plt 313; glucose 494; bun 19; creat 1.65; k+ 4.0 Na++124; alk phos 96; ast 10; alt 15; albumin 3.2   11-26-12: tsh 3.006 11-28-12: hgb a1c 13.6   12-18-12: glucose 144; bun 13; creat 1.31; k+ 4.0; na++ 137 12-29-12: wbc 4.6; hgb 8.0.; hct 24.3; mcv 84.1. plt 282 05-25-13: na 135, k 4.5, glu 141, bun 44, cr 1.56, ca 9.0 06-29-13: uric acid 7.3  ASSESSMENT/ PLAN:  General exam Will schedule mammogram and pelvic/pap  smear with her gyn. Will get routine labs. Will get her flu vaccine for this season. uptodate with pneumococcal vaccine. Has been getting OOB. Mood has been better  HYPERTENSION Her blood pressure is stable on lisinopril 5 mg daily, lasix 40 mg daily. Monitor bmp  Diabetes mellitus type 2, uncontrolled, without complications Continue metformin er 500  mg daily with lantus 40 units daily and novolog SSI. Continue lisinopril. Not on statin, check FLP. Check a1c. Check urine microalbumin level  Gout No recent acute flare. Uric acid level 7.1. Continue allopurinol and colchicine for now and recheck uric acid in 6 weeks. If remains elevated, consider increasing dose of allpourinol  Depression Is stable is taking prozac 60 mg daily and wellbutrin 75 mg daily  Chronic pain Her pain is being adequately controlled, will continue flexeril 10 mg three times daily as needed and neurontin 900 mg three times daily. Also will continue prn ultram  Anemia Continue ferrous sulfate, monitor cbc  Unspecified constipation Will continue miralax daily    Post-menopausal bleeding She is stable has been seen by gyn services; megace 40 mg daily through 08-09-13.   Edema Is stable is taking lasix 40 mg daily    Labs/tests ordered- cbc, cmp, flp, a1c

## 2013-07-23 ENCOUNTER — Other Ambulatory Visit: Payer: Self-pay | Admitting: *Deleted

## 2013-07-23 MED ORDER — TRAMADOL HCL 50 MG PO TABS: 200.0000 mg | ORAL_TABLET | Freq: Two times a day (BID) | ORAL | Status: DC

## 2013-07-29 ENCOUNTER — Non-Acute Institutional Stay (SKILLED_NURSING_FACILITY): Payer: Medicare Other | Admitting: Adult Health

## 2013-07-29 DIAGNOSIS — K5900 Constipation, unspecified: Secondary | ICD-10-CM

## 2013-07-29 DIAGNOSIS — I10 Essential (primary) hypertension: Secondary | ICD-10-CM

## 2013-07-29 DIAGNOSIS — D649 Anemia, unspecified: Secondary | ICD-10-CM

## 2013-07-29 DIAGNOSIS — N950 Postmenopausal bleeding: Secondary | ICD-10-CM

## 2013-07-29 DIAGNOSIS — F3289 Other specified depressive episodes: Secondary | ICD-10-CM

## 2013-07-29 DIAGNOSIS — M109 Gout, unspecified: Secondary | ICD-10-CM

## 2013-07-29 DIAGNOSIS — R609 Edema, unspecified: Secondary | ICD-10-CM

## 2013-07-29 DIAGNOSIS — G8929 Other chronic pain: Secondary | ICD-10-CM

## 2013-07-29 DIAGNOSIS — IMO0001 Reserved for inherently not codable concepts without codable children: Secondary | ICD-10-CM

## 2013-07-29 DIAGNOSIS — F329 Major depressive disorder, single episode, unspecified: Secondary | ICD-10-CM

## 2013-08-06 ENCOUNTER — Ambulatory Visit (HOSPITAL_COMMUNITY): Payer: Medicare Other

## 2013-08-13 ENCOUNTER — Telehealth: Payer: Self-pay | Admitting: *Deleted

## 2013-08-13 ENCOUNTER — Other Ambulatory Visit: Payer: Self-pay | Admitting: *Deleted

## 2013-08-13 DIAGNOSIS — N938 Other specified abnormal uterine and vaginal bleeding: Secondary | ICD-10-CM

## 2013-08-13 NOTE — Telephone Encounter (Signed)
Spoke with patient via phone and confirmed appointment for ultrasound on 08/18/13 at 2:15 pm.  Patient states understanding.

## 2013-08-13 NOTE — Telephone Encounter (Signed)
Spoke with patient regarding Ultrasound appointment on 08/05/13 which she cancelled.  Explained to patient she is at high risk of developing endometrial cancer and it is very important that we keep a close check and the ultrasound is a part of that process.  The patient states understanding and agrees to have me reschedule the appointment.  I contacted ultrasound and rescheduled appointment to 08/17/13 at 2:15 pm.  Telephoned back to patient to make her aware of appointment date/time.  Got patient's voicemail and left message with appointment date/time and instructions to have a full bladder.  Will attempt to contact patient again a little later in the day to ensure she received the message.

## 2013-08-18 ENCOUNTER — Ambulatory Visit (HOSPITAL_COMMUNITY)
Admission: RE | Admit: 2013-08-18 | Discharge: 2013-08-18 | Disposition: A | Discharge status: Home / Self Care | Payer: Medicare Other | Source: Ambulatory Visit | Attending: Obstetrics & Gynecology | Admitting: Obstetrics & Gynecology

## 2013-08-18 ENCOUNTER — Other Ambulatory Visit (HOSPITAL_COMMUNITY): Payer: Medicare Other

## 2013-08-18 DIAGNOSIS — R9389 Abnormal findings on diagnostic imaging of other specified body structures: Secondary | ICD-10-CM | POA: Insufficient documentation

## 2013-08-18 DIAGNOSIS — N938 Other specified abnormal uterine and vaginal bleeding: Secondary | ICD-10-CM | POA: Insufficient documentation

## 2013-08-18 DIAGNOSIS — N949 Unspecified condition associated with female genital organs and menstrual cycle: Secondary | ICD-10-CM | POA: Insufficient documentation

## 2013-08-19 ENCOUNTER — Telehealth: Payer: Self-pay | Admitting: Obstetrics and Gynecology

## 2013-08-19 NOTE — Telephone Encounter (Addendum)
Message copied by Toula Moos on Wed Aug 19, 2013  3:10 PM   ------  Patient is a resident at [facility]. Spoke to her nurse at that facility [facility]) and gave note below to continue megace. She states understanding and requested to fax the note to the facility- fax number is [phone]. Faxed note.        Message from: Willodean Rosenthal      Created: Wed Aug 19, 2013 11:56 AM       Please call pt.  Her endometrial stripe is more thin than previously.  Please have pt keep taking Megace.  Please see if she needs a refill on her Megace.            Thx,      clh-S  ------

## 2013-08-24 ENCOUNTER — Encounter: Payer: Self-pay | Admitting: Adult Health

## 2013-08-24 NOTE — Assessment & Plan Note (Signed)
She has not had further bleeding present; will continue megace 40 mg daily through 08-09-13 and will monitor her status

## 2013-08-24 NOTE — Assessment & Plan Note (Signed)
She is stable will continue lasix 40 mg daily and will monitor

## 2013-08-24 NOTE — Progress Notes (Signed)
Patient ID: Lauren Barber, female   DOB: 1956-03-18, 57 y.o.   MRN: [id number]  [facility]  No Known Allergies   Chief Complaint  Patient presents with  . Medical Managment of Chronic Issues    HPI: She is being seen for the management of her chronic illnesses. There are no concerns being voiced by the nursing staff at this time. She is not voicing any complaints she had not had any recent infections present.    Past Medical History  Diagnosis Date  . Hypertension   . Obesities, morbid   . Stroke   . CVA (cerebral vascular accident)   . Vaginal bleeding     Past Surgical History  Procedure Laterality Date  . Cesarean section    . Biopsy endometrial N/A 10 03 2013    VITAL SIGNS BP 108/63  Pulse 100  Ht 5\' 9"  (1.753 m)  Wt 354 lb (160.573 kg)  BMI 52.25 kg/m2   Patient's Medications  New Prescriptions   No medications on file  Previous Medications   ALLOPURINOL (ZYLOPRIM) 100 MG TABLET    Take 1 tablet (100 mg total) by mouth daily.   BUPROPION (WELLBUTRIN) 75 MG TABLET    Take 75 mg by mouth daily.    COLCHICINE 0.6 MG TABLET    Take 1 tablet (0.6 mg total) by mouth daily. 2 tabs now, then one tab daily for 6 months.   CYCLOBENZAPRINE (FLEXERIL) 10 MG TABLET    Take 10 mg by mouth 3 (three) times daily as needed. spasms   DIPHENHYDRAMINE (BENADRYL) 25 MG TABLET    Take 25 mg by mouth every 8 (eight) hours as needed for itching. For itching   EPINEPHRINE (EPIPEN) 0.3 MG/0.3 ML DEVI    Inject 0.3 mg into the muscle once.   FLUOXETINE (PROZAC) 40 MG CAPSULE    Take 60 mg by mouth daily.    FUROSEMIDE (LASIX) 40 MG TABLET    Take 40 mg by mouth daily.   GABAPENTIN (NEURONTIN) 300 MG CAPSULE    Take 300 mg by mouth 3 (three) times daily.    INSULIN ASPART (NOVOLOG) 100 UNIT/ML INJECTION    Inject 7 Units into the skin 3 (three) times daily before meals. With an additional 7 units prior to meals for cbg >=150   INSULIN DETEMIR (LEVEMIR) 100 UNIT/ML INJECTION     Inject 40 Units into the skin at bedtime.   LISINOPRIL (PRINIVIL,ZESTRIL) 5 MG TABLET    Take 5 mg by mouth daily. Hold for SBP <100   MEGESTROL (MEGACE) 40 MG TABLET    Take 1 tablet (40 mg total) by mouth daily.   METFORMIN (GLUMETZA) 500 MG (MOD) 24 HR TABLET    Take 500 mg by mouth daily with breakfast.   OLOPATADINE HCL (PATADAY) 0.2 % SOLN    Apply 1 drop to eye daily. Both eyes   POLYETHYLENE GLYCOL (MIRALAX / GLYCOLAX) PACKET    Take 17 g by mouth daily.   TRAMADOL (ULTRAM) 50 MG TABLET    Take 4 tablets (200 mg total) by mouth every 12 (twelve) hours. For pain   TRIAMCINOLONE LOTION (KENALOG) 0.1 %    Apply 1 application topically daily as needed.   Modified Medications   Modified Medication Previous Medication   FERROUS SULFATE 325 (65 FE) MG TABLET ferrous sulfate 325 (65 FE) MG tablet      Take 325 mg by mouth daily.    Take 1 tablet (325 mg total) by mouth  daily.  Discontinued Medications   No medications on file    SIGNIFICANT DIAGNOSTIC EXAMS     LABS REVIEWED:   07-06-13: wbc 6.4; hgb 9.5; hct 29.5; mcv 91.3; plt 346; glucose 104; bun 24; creat 1.42; k+4.2; na++136; liver normal albumin 3.8; chol 158; ldl 96; trig 174; hgb a1c 6.5    Review of Systems  Constitutional: Negative for malaise/fatigue.  Respiratory: Negative for cough and shortness of breath.   Cardiovascular: Negative for chest pain, palpitations and leg swelling.  Gastrointestinal: Negative for heartburn, abdominal pain and constipation.  Musculoskeletal: Negative for myalgias and back pain.  Skin: Negative.   Neurological: Negative for headaches.  Psychiatric/Behavioral: Negative for depression. The patient is not nervous/anxious.     Physical Exam  Constitutional: She is oriented to person, place, and time.  Morbidly obese  Neck: Neck supple. No JVD present. No thyromegaly present.  Cardiovascular: Normal rate, regular rhythm and intact distal pulses.   Respiratory: Effort normal and breath  sounds normal. No respiratory distress. She has no wheezes.  GI: Soft. Bowel sounds are normal. She exhibits no distension. There is no tenderness.  Musculoskeletal: She exhibits no edema.  Has limited range of motion due to her obesity   Neurological: She is alert and oriented to person, place, and time.  Skin: Skin is warm and dry.  Psychiatric: She has a normal mood and affect.     ASSESSMENT/ PLAN:  Gout Is stable will continue allopurinol 100 mg daily and colchicine 0.6 mg daily and will monitor   Depression She is emotionally stable will continue prozac 60 mg daily and wellbutrin 75 mg daily and will monitor her status   Post-menopausal bleeding She has not had further bleeding present; will continue megace 40 mg daily through 08-09-13 and will monitor her status   Chronic pain Her pain is being managed will continue neurontin 300 mg three times daily; flexeril 10 mg three times daily as needed; and ultram 200 mg twice daily as needed and will monitor her status   Anemia She is stable will continue iron daily   Diabetes mellitus type 2, uncontrolled, without complications She is stable will continue her metformin er 500 mg daily  levemir 40 units nightly and novolog 7 units prior to meals with an additional 7 units prior to meals for cbg >=150 and will continue to monitor her diabetes.   Unspecified constipation Will continue miralax daily   HYPERTENSION Is stable will continue lisinopril 5 mg daily and will monitor   Edema She is stable will continue lasix 40 mg daily and will monitor     Time spent with patient 50 minutes.

## 2013-08-24 NOTE — Assessment & Plan Note (Signed)
Is stable will continue allopurinol 100 mg daily and colchicine 0.6 mg daily and will monitor

## 2013-08-24 NOTE — Assessment & Plan Note (Signed)
Is stable will continue lisinopril 5 mg daily and will monitor

## 2013-08-24 NOTE — Assessment & Plan Note (Addendum)
She is stable will continue her metformin er 500 mg daily  levemir 40 units nightly and novolog 7 units prior to meals with an additional 7 units prior to meals for cbg >=150 and will continue to monitor her diabetes.

## 2013-08-24 NOTE — Assessment & Plan Note (Signed)
She is stable will continue iron daily

## 2013-08-24 NOTE — Assessment & Plan Note (Signed)
Will continue miralax daily  

## 2013-08-24 NOTE — Assessment & Plan Note (Signed)
Her pain is being managed will continue neurontin 300 mg three times daily; flexeril 10 mg three times daily as needed; and ultram 200 mg twice daily as needed and will monitor her status

## 2013-08-24 NOTE — Assessment & Plan Note (Signed)
She is emotionally stable will continue prozac 60 mg daily and wellbutrin 75 mg daily and will monitor her status

## 2013-09-02 ENCOUNTER — Non-Acute Institutional Stay (SKILLED_NURSING_FACILITY): Payer: Medicare Other | Admitting: Adult Health

## 2013-09-02 DIAGNOSIS — N950 Postmenopausal bleeding: Secondary | ICD-10-CM

## 2013-09-02 DIAGNOSIS — M109 Gout, unspecified: Secondary | ICD-10-CM

## 2013-09-02 DIAGNOSIS — D649 Anemia, unspecified: Secondary | ICD-10-CM

## 2013-09-02 DIAGNOSIS — G8929 Other chronic pain: Secondary | ICD-10-CM

## 2013-09-02 DIAGNOSIS — I10 Essential (primary) hypertension: Secondary | ICD-10-CM

## 2013-09-02 DIAGNOSIS — Z0000 Encounter for general adult medical examination without abnormal findings: Secondary | ICD-10-CM

## 2013-09-02 DIAGNOSIS — R609 Edema, unspecified: Secondary | ICD-10-CM

## 2013-09-07 ENCOUNTER — Encounter: Payer: Self-pay | Admitting: Adult Health

## 2013-09-07 NOTE — Progress Notes (Signed)
Patient ID: Lauren Barber, female   DOB: 14-Oct-1956, 57 y.o.   MRN: [id number]  [facility]  No Known Allergies   Chief Complaint  Patient presents with  . Medical Managment of Chronic Issues    HPI: She is being seen for the management of her chronic illnesses. overall she is doing well. Her diabetes is being well managed. She is working on returning back to her home environment her goal is to return back home within the next month. We discussed at great length them importance of her being able to give herself her own insulin injections and her ability to check her own cbg. The nursing staff stated that they will work with her on achieving those goals for her in order for her to return back home.   Past Medical History  Diagnosis Date  . Hypertension   . Obesities, morbid   . Stroke   . CVA (cerebral vascular accident)   . Vaginal bleeding     Past Surgical History  Procedure Laterality Date  . Cesarean section    . Biopsy endometrial N/A 10 03 2013    VITAL SIGNS BP 109/60  Pulse 94  Ht 5\' 6"  (1.676 m)  Wt 350 lb (158.759 kg)  BMI 56.52 kg/m2   Patient's Medications  New Prescriptions   No medications on file  Previous Medications   ALLOPURINOL (ZYLOPRIM) 100 MG TABLET    Take 1 tablet (100 mg total) by mouth daily.   BUPROPION (WELLBUTRIN) 75 MG TABLET    Take 75 mg by mouth daily.    COLCHICINE 0.6 MG TABLET    Take 1 tablet (0.6 mg total) by mouth daily. 2 tabs now, then one tab daily for 6 months.   CYCLOBENZAPRINE (FLEXERIL) 10 MG TABLET    Take 10 mg by mouth 3 (three) times daily as needed. spasms   DIPHENHYDRAMINE (BENADRYL) 25 MG TABLET    Take 25 mg by mouth every 8 (eight) hours as needed for itching. For itching   EPINEPHRINE (EPIPEN) 0.3 MG/0.3 ML DEVI    Inject 0.3 mg into the muscle once.   FERROUS SULFATE 325 (65 FE) MG TABLET    Take 325 mg by mouth daily.   FLUOXETINE (PROZAC) 40 MG CAPSULE    Take 60 mg by mouth daily.    FUROSEMIDE (LASIX) 40  MG TABLET    Take 40 mg by mouth daily.   GABAPENTIN (NEURONTIN) 300 MG CAPSULE    Take 300 mg by mouth 3 (three) times daily.    INSULIN ASPART (NOVOLOG) 100 UNIT/ML INJECTION    Inject 7 Units into the skin 3 (three) times daily before meals.    INSULIN DETEMIR (LEVEMIR) 100 UNIT/ML INJECTION    Inject 40 Units into the skin at bedtime.   LISINOPRIL (PRINIVIL,ZESTRIL) 5 MG TABLET    Take 5 mg by mouth daily. Hold for SBP <100   METFORMIN (GLUMETZA) 500 MG (MOD) 24 HR TABLET    Take 500 mg by mouth daily with breakfast.   OLOPATADINE HCL (PATADAY) 0.2 % SOLN    Apply 1 drop to eye daily. Both eyes   POLYETHYLENE GLYCOL (MIRALAX / GLYCOLAX) PACKET    Take 17 g by mouth daily.   TRAMADOL (ULTRAM) 50 MG TABLET    Take 4 tablets (200 mg total) by mouth every 12 (twelve) hours. For pain   TRIAMCINOLONE LOTION (KENALOG) 0.1 %    Apply 1 application topically daily as needed.   Modified Medications  No medications on file  Discontinued Medications   MEGESTROL (MEGACE) 40 MG TABLET    Take 1 tablet (40 mg total) by mouth daily.    SIGNIFICANT DIAGNOSTIC EXAMS     LABS REVIEWED:   06-29-13: uric acid 7.3 07-06-13: wbc 6.4; hgb 9.5; hct 29.5; mcv 91.3; plt 346; glucose 104; bun 24; creat 1.42; k+4.2; na++136; liver normal albumin 3.8; chol 158; ldl 96; trig 174; hgb a1c 6.5  07-07-13: micro-albumin 1.53    Review of Systems  Constitutional: Negative for malaise/fatigue.  Respiratory: Negative for cough and shortness of breath.   Cardiovascular: Negative for chest pain, palpitations and leg swelling.  Gastrointestinal: Negative for heartburn, abdominal pain and constipation.  Musculoskeletal: Negative for myalgias and back pain.  Skin: Negative.   Neurological: Negative for headaches.  Psychiatric/Behavioral: Negative for depression. The patient is not nervous/anxious.     Physical Exam  Constitutional: She is oriented to person, place, and time.  Morbidly obese  Neck: Neck supple. No  JVD present. No thyromegaly present.  Cardiovascular: Normal rate, regular rhythm and intact distal pulses.   Respiratory: Effort normal and breath sounds normal. No respiratory distress. She has no wheezes.  GI: Soft. Bowel sounds are normal. She exhibits no distension. There is no tenderness.  Musculoskeletal: She exhibits no edema.  Has limited range of motion due to her obesity   Neurological: She is alert and oriented to person, place, and time.  Skin: Skin is warm and dry.  Psychiatric: She has a normal mood and affect.     ASSESSMENT/ PLAN:  Gout Is stable will continue allopurinol 100 mg daily and colchicine 0.6 mg daily and will monitor   Depression She is emotionally stable will continue prozac 60 mg daily and wellbutrin 75 mg daily and will monitor her status   Post-menopausal bleeding She has not had further bleeding present; she has completed her megace will monitor her status and will not make changes.   Chronic pain Her pain is being managed will continue neurontin 300 mg three times daily; flexeril 10 mg three times daily as needed; and ultram 200 mg twice daily as needed and will monitor her status   Anemia She is stable will continue iron daily   Diabetes mellitus type 2, uncontrolled, without complications She is stable will continue her metformin er 500 mg daily  levemir 40 units nightly and novolog 7 units prior to meals and will continue to monitor her diabetes.   Unspecified constipation Will continue miralax daily   HYPERTENSION Is stable will continue lisinopril 5 mg daily and will monitor   Edema She is stable will continue lasix 40 mg daily and will monitor

## 2013-10-20 ENCOUNTER — Encounter: Payer: Self-pay | Admitting: Internal Medicine

## 2013-10-23 ENCOUNTER — Emergency Department (HOSPITAL_COMMUNITY)
Admission: EM | Admit: 2013-10-23 | Discharge: 2013-10-24 | Disposition: A | Discharge status: Home / Self Care | Payer: Medicare Other | Attending: Emergency Medicine | Admitting: Emergency Medicine

## 2013-10-23 ENCOUNTER — Encounter (HOSPITAL_COMMUNITY): Payer: Self-pay | Admitting: Emergency Medicine

## 2013-10-23 ENCOUNTER — Non-Acute Institutional Stay (SKILLED_NURSING_FACILITY): Payer: Medicare Other | Admitting: Internal Medicine

## 2013-10-23 DIAGNOSIS — K5900 Constipation, unspecified: Secondary | ICD-10-CM

## 2013-10-23 DIAGNOSIS — Z9889 Other specified postprocedural states: Secondary | ICD-10-CM | POA: Insufficient documentation

## 2013-10-23 DIAGNOSIS — R195 Other fecal abnormalities: Secondary | ICD-10-CM

## 2013-10-23 DIAGNOSIS — R197 Diarrhea, unspecified: Secondary | ICD-10-CM | POA: Insufficient documentation

## 2013-10-23 DIAGNOSIS — I10 Essential (primary) hypertension: Secondary | ICD-10-CM | POA: Insufficient documentation

## 2013-10-23 DIAGNOSIS — Z8742 Personal history of other diseases of the female genital tract: Secondary | ICD-10-CM | POA: Insufficient documentation

## 2013-10-23 DIAGNOSIS — G4700 Insomnia, unspecified: Secondary | ICD-10-CM | POA: Insufficient documentation

## 2013-10-23 DIAGNOSIS — F17200 Nicotine dependence, unspecified, uncomplicated: Secondary | ICD-10-CM | POA: Insufficient documentation

## 2013-10-23 DIAGNOSIS — E119 Type 2 diabetes mellitus without complications: Secondary | ICD-10-CM | POA: Insufficient documentation

## 2013-10-23 DIAGNOSIS — F3289 Other specified depressive episodes: Secondary | ICD-10-CM | POA: Insufficient documentation

## 2013-10-23 DIAGNOSIS — Z79899 Other long term (current) drug therapy: Secondary | ICD-10-CM | POA: Insufficient documentation

## 2013-10-23 DIAGNOSIS — Z8673 Personal history of transient ischemic attack (TIA), and cerebral infarction without residual deficits: Secondary | ICD-10-CM | POA: Insufficient documentation

## 2013-10-23 DIAGNOSIS — IMO0002 Reserved for concepts with insufficient information to code with codable children: Secondary | ICD-10-CM | POA: Insufficient documentation

## 2013-10-23 DIAGNOSIS — Z872 Personal history of diseases of the skin and subcutaneous tissue: Secondary | ICD-10-CM | POA: Insufficient documentation

## 2013-10-23 DIAGNOSIS — Z8669 Personal history of other diseases of the nervous system and sense organs: Secondary | ICD-10-CM | POA: Insufficient documentation

## 2013-10-23 DIAGNOSIS — Z794 Long term (current) use of insulin: Secondary | ICD-10-CM | POA: Insufficient documentation

## 2013-10-23 DIAGNOSIS — M109 Gout, unspecified: Secondary | ICD-10-CM | POA: Insufficient documentation

## 2013-10-23 DIAGNOSIS — F329 Major depressive disorder, single episode, unspecified: Secondary | ICD-10-CM | POA: Insufficient documentation

## 2013-10-23 HISTORY — DX: Depression, unspecified: F32.A

## 2013-10-23 HISTORY — DX: Gout, unspecified: M10.9

## 2013-10-23 HISTORY — DX: Cardiomegaly: I51.7

## 2013-10-23 HISTORY — DX: Reserved for concepts with insufficient information to code with codable children: IMO0002

## 2013-10-23 HISTORY — DX: Major depressive disorder, single episode, unspecified: F32.9

## 2013-10-23 HISTORY — DX: Acute atopic conjunctivitis, unspecified eye: H10.10

## 2013-10-23 HISTORY — DX: Endometriosis, unspecified: N80.9

## 2013-10-23 HISTORY — DX: Insomnia, unspecified: G47.00

## 2013-10-23 HISTORY — DX: Type 2 diabetes mellitus without complications: E11.9

## 2013-10-23 HISTORY — DX: Cellulitis, unspecified: L03.90

## 2013-10-23 LAB — OCCULT BLOOD, POC DEVICE: Fecal Occult Bld: NEGATIVE

## 2013-10-23 LAB — COMPREHENSIVE METABOLIC PANEL
AST: 20 U/L (ref 0–37)
BUN: 27 mg/dL — ABNORMAL HIGH (ref 6–23)
CO2: 18 mEq/L — ABNORMAL LOW (ref 19–32)
Chloride: 107 mEq/L (ref 96–112)
Creatinine, Ser: 1.62 mg/dL — ABNORMAL HIGH (ref 0.50–1.10)
GFR calc non Af Amer: 35 mL/min — ABNORMAL LOW (ref >90)
Potassium: 3.6 mEq/L (ref 3.5–5.1)
Total Bilirubin: 0.2 mg/dL — ABNORMAL LOW (ref 0.3–1.2)

## 2013-10-23 LAB — CBC
HCT: 28.6 % — ABNORMAL LOW (ref 36.0–46.0)
MCH: 31.7 pg (ref 26.0–34.0)
MCV: 95.3 fL (ref 78.0–100.0)
Platelets: 303 10*3/uL (ref 150–400)
RBC: 3 MIL/uL — ABNORMAL LOW (ref 3.87–5.11)
RDW: 15 % (ref 11.5–15.5)
WBC: 9 10*3/uL (ref 4.0–10.5)

## 2013-10-23 MED ORDER — SODIUM CHLORIDE 0.9 % IV SOLN
INTRAVENOUS | Status: DC
  Administered 2013-10-23: 23:00:00 via INTRAVENOUS

## 2013-10-23 MED ORDER — IOHEXOL 300 MG/ML  SOLN
25.0000 mL | Freq: Once | INTRAMUSCULAR | Status: AC | PRN
  Administered 2013-10-23: 25 mL via ORAL

## 2013-10-23 NOTE — ED Notes (Signed)
Pt has clear gelatinous substance around rectum and in rectum. Dr. Theodoro Kalata made aware.

## 2013-10-23 NOTE — ED Notes (Signed)
Per EMS pt from [facility] called EMS out due to pt abdominal xray results today sts "colonic ileus vs. Distal colon obstruction" pt denies pain but diarrhea sts defecating "goey liquid".

## 2013-10-23 NOTE — Progress Notes (Signed)
Patient ID: Lauren Barber, female   DOB: May 09, 1956, 57 y.o.   MRN: [id number]  Lauren Barber living [place]  No Known Allergies  Chief Complaint  Patient presents with  . Acute Visit    stool with mucus   HPI 57 y/o female patient is seen today for acute visit. She has been having mucus from rectum for a week. Has not had a bowel movement for 5 days with last bowel movement being loose stool 5 days back. Denies any blood in it. No abdominal pain No nausea or vomiting No vaginal discharge No rectal pain Has been passing gas Appetite has been good  Review of Systems  Constitutional: Negative for fever, chills, weight loss, malaise/fatigue and diaphoresis.  HENT: Negative for congestion, hearing loss and sore throat.   Eyes: Negative for blurred vision, double vision and discharge.  Respiratory: Negative for cough, sputum production, shortness of breath and wheezing.   Cardiovascular: Negative for chest pain, palpitations, orthopnea and leg swelling.  Gastrointestinal: Negative for heartburn, nausea, vomiting, abdominal pain Genitourinary: Negative for dysuria, urgency, frequency and flank pain.  Musculoskeletal: Negative for back pain, falls, joint pain and myalgias.  Skin: Negative for itching and rash.  Neurological: Negative for dizziness, tingling, focal weakness and headaches.  Psychiatric/Behavioral: Negative for depression and memory loss. The patient is not nervous/anxious.    Past Medical History  Diagnosis Date  . Hypertension   . Obesities, morbid   . Stroke   . CVA (cerebral vascular accident)   . Vaginal bleeding   . Endometriosis   . Atopic conjunctivitis   . Diabetes mellitus without complication   . Cellulitis   . Insomnia   . Cardiomegaly   . Degenerative, intervertebral disc   . Depressive disorder   . Gout     Medication reviewed. See [facility]  Physical exam BP 112/82  Pulse 71  Temp(Src) 98.2 F (36.8 C)  Resp 16  Ht 5\' 9"  (1.753 m)  Wt 338 lb  (153.316 kg)  BMI 49.89 kg/m2  SpO2 96%  General- adult obese female in no acute distress Head- atraumatic, normocephalic Eyes- PERRLA, EOMI, no pallor, no icterus Neck- no lymphadenopathy Chest- no chest wall deformities, no chest wall tenderness Cardiovascular- normal s1,s2, no murmurs/ rubs/ gallops Respiratory- bilateral clear to auscultation, no wheeze, no rhonchi, no crackles Abdomen- distended abdomen, non tender, bowel sounds present,no guarding or rigidity, no CVA tenderness Musculoskeletal- able to move all 4 extremities Neurological- no focal deficit Psychiatry- alert and oriented to person, place and time, normal mood and affect Rectal exam- empty rectal vault, mucus in her diaper Skin- dry, intact, warm  Assessment/plan  Constipation- Abdominal xray to assess for fecal impaction and rule out ileus. Will give lactulose 15 cc bid today and assess. Stop miralax as this can cause bloating and bowel obstruction as well and has not been helpful currently.  Mucus discharge- per rectum, no hemorrhoids or mass on rectal exam. No stool present in rectal vault. No abdominal pain or cramps making IBD less likely. No blood in rectal discharge and pt is afebrile, no AMS making infectious cause less likely. still there is possibility of colitis. If this persists, need to consider colonoscopy to assess for IBD vs colitis.Will send stool for culture if able to obtain any

## 2013-10-23 NOTE — ED Provider Notes (Signed)
CSN: [id number]     Arrival date & time 10/23/13  2248 History   First MD Initiated Contact with Patient 10/23/13 2303     Chief Complaint  Patient presents with  . Colon Obstruction    (Consider location/radiation/quality/duration/timing/severity/associated sxs/prior Treatment) HPI History provided by patient. Has history of diabetes and previous stroke, is bedbound. Patient resides at [facility] facility. For the last week she has been having watery stool without history of same. She denies any recent antibiotics. She denies any abdominal pain. No nausea or vomiting. She has no new complaints otherwise.  She had an x-ray at her living facility but reportedly shows ileus versus distal colon obstruction and was sent here for further evaluation. She denies any blood in her stools. She has a history of constipation. No fevers or chills. No known sick contacts. Symptoms mild/moderate severity. Past Medical History  Diagnosis Date  . Hypertension   . Obesities, morbid   . Stroke   . CVA (cerebral vascular accident)   . Vaginal bleeding   . Endometriosis   . Atopic conjunctivitis   . Diabetes mellitus without complication   . Cellulitis   . Insomnia   . Cardiomegaly   . Degenerative, intervertebral disc   . Depressive disorder   . Gout    Past Surgical History  Procedure Laterality Date  . Cesarean section    . Biopsy endometrial N/A 10 03 2013   No family history on file. History  Substance Use Topics  . Smoking status: Current Some Day Smoker -- 0.50 packs/day    Types: Cigarettes  . Smokeless tobacco: Not on file  . Alcohol Use: No   OB History   Grav Para Term Preterm Abortions TAB SAB Ect Mult Living   2 2 2  0 0 0 0 0 0 2     Review of Systems  Constitutional: Negative for fever and chills.  Eyes: Negative for pain.  Respiratory: Negative for shortness of breath.   Cardiovascular: Negative for chest pain.  Gastrointestinal: Negative for abdominal pain, blood in  stool and abdominal distention.  Genitourinary: Negative for dysuria.  Musculoskeletal: Negative for back pain, neck pain and neck stiffness.  Skin: Negative for rash.  Neurological: Negative for headaches.  All other systems reviewed and are negative.    Allergies  Review of patient's allergies indicates no known allergies.  Home Medications   Current Outpatient Rx  Name  Route  Sig  Dispense  Refill  . allopurinol (ZYLOPRIM) 100 MG tablet   Oral   Take 1 tablet (100 mg total) by mouth daily.   30 tablet   0   . buPROPion (WELLBUTRIN) 75 MG tablet   Oral   Take 75 mg by mouth daily.          . colchicine 0.6 MG tablet   Oral   Take 1 tablet (0.6 mg total) by mouth daily. 2 tabs now, then one tab daily for 6 months.   30 tablet   5   . cyclobenzaprine (FLEXERIL) 10 MG tablet   Oral   Take 10 mg by mouth 3 (three) times daily as needed. spasms         . diphenhydrAMINE (BENADRYL) 25 MG tablet   Oral   Take 25 mg by mouth every 8 (eight) hours as needed for itching. For itching         . EPINEPHrine (EPIPEN) 0.3 mg/0.3 mL DEVI   Intramuscular   Inject 0.3 mg into the muscle  once.         . ferrous sulfate 325 (65 FE) MG tablet   Oral   Take 325 mg by mouth daily.         Marland Kitchen FLUoxetine (PROZAC) 40 MG capsule   Oral   Take 60 mg by mouth daily.          . furosemide (LASIX) 40 MG tablet   Oral   Take 40 mg by mouth daily.         Marland Kitchen gabapentin (NEURONTIN) 300 MG capsule   Oral   Take 300 mg by mouth 3 (three) times daily.          . insulin aspart (NOVOLOG) 100 UNIT/ML injection   Subcutaneous   Inject 7 Units into the skin 3 (three) times daily before meals.          . insulin detemir (LEVEMIR) 100 UNIT/ML injection   Subcutaneous   Inject 40 Units into the skin at bedtime.         Marland Kitchen lisinopril (PRINIVIL,ZESTRIL) 5 MG tablet   Oral   Take 5 mg by mouth daily. Hold for SBP <100         . metFORMIN (GLUMETZA) 500 MG (MOD) 24 hr  tablet   Oral   Take 500 mg by mouth daily with breakfast.         . Olopatadine HCl (PATADAY) 0.2 % SOLN   Ophthalmic   Apply 1 drop to eye daily. Both eyes         . polyethylene glycol (MIRALAX / GLYCOLAX) packet   Oral   Take 17 g by mouth daily.         . traMADol (ULTRAM) 50 MG tablet   Oral   Take 4 tablets (200 mg total) by mouth every 12 (twelve) hours. For pain   240 tablet   5   . triamcinolone lotion (KENALOG) 0.1 %   Topical   Apply 1 application topically daily as needed.           BP 110/88  Pulse 106  Temp(Src) 98.9 F (37.2 C) (Oral)  Resp 18  SpO2 98% Physical Exam  Constitutional: She is oriented to person, place, and time. She appears well-developed and well-nourished.  HENT:  Head: Normocephalic and atraumatic.  Mouth/Throat: Oropharynx is clear and moist.  Eyes: EOM are normal. Pupils are equal, round, and reactive to light. No scleral icterus.  Neck: Neck supple.  Cardiovascular: Regular rhythm and intact distal pulses.   Pulmonary/Chest: Effort normal. No respiratory distress.  Abdominal: Soft. Bowel sounds are normal. She exhibits no distension and no mass. There is no tenderness. There is no rebound and no guarding.  obese  Musculoskeletal: Normal range of motion. She exhibits no edema.  Neurological: She is alert and oriented to person, place, and time.  Skin: Skin is warm and dry.    ED Course  Procedures (including critical care time) Labs Review Labs Reviewed  CBC - Abnormal; Notable for the following:    RBC 3.00 (*)    Hemoglobin 9.5 (*)    HCT 28.6 (*)    All other components within normal limits  COMPREHENSIVE METABOLIC PANEL - Abnormal; Notable for the following:    CO2 18 (*)    Glucose, Bld 112 (*)    BUN 27 (*)    Creatinine, Ser 1.62 (*)    Albumin 3.4 (*)    Total Bilirubin 0.2 (*)    GFR calc non Af  Amer 35 (*)    GFR calc Af Amer 40 (*)    All other components within normal limits  POCT I-STAT 3, BLOOD  GAS (G3+) - Abnormal; Notable for the following:    pCO2 arterial 31.1 (*)    Bicarbonate 17.2 (*)    Acid-base deficit 7.0 (*)    All other components within normal limits  CLOSTRIDIUM DIFFICILE BY PCR  BLOOD GAS, ARTERIAL  OCCULT BLOOD, POC DEVICE   Imaging Review Ct Abdomen Pelvis W Contrast  10/24/2013   CLINICAL DATA:  Abdominal pain and diarrhea.  EXAM: CT ABDOMEN AND PELVIS WITH CONTRAST  TECHNIQUE: Multidetector CT imaging of the abdomen and pelvis was performed using the standard protocol following bolus administration of intravenous contrast.  CONTRAST:  100 mL Omnipaque 300  COMPARISON:  None.  FINDINGS: The entire anterior abdomen could not be imaged due to the patient's body habitus. Mild scarring or atelectasis at the lung bases. No evidence for pleural effusions. Limited evaluation for free air.  No gross abnormality to the visualized liver, portal venous system and gallbladder. 1.1 cm low-density structure along the medial aspect of the spleen is indeterminate and likely an incidental finding. No gross abnormalities to the adrenal glands or pancreas. Normal appearance of the kidneys but no significant excretion of contrast on the delayed images.  The urinary bladder appears to be displaced towards the left side of the pelvis. No gross abnormality to the uterus. The sigmoid is distended with gas and incompletely evaluated. Gas and stool throughout the colon. No gross abnormality to the small bowel loops. There is an ossification just anterior to the left proximal femur of unknown etiology. This could represent old trauma. Degenerative facet disease in the lower lumbar spine.  IMPRESSION: Limited examination because the entire abdomen could not imaged due to the patient's body habitus.  Gas and stool-filled colon. In particular, there is marked gaseous distention of sigmoid colon.  No gross abnormality to the solid organs but there is no contrast excretion on the delayed kidney images.  Findings may be related to renal insufficiency and recommend hydration and follow-up renal labs.  These results were called by telephone at the time of interpretation on 10/24/2013 at 1:30 AM to Dr. Sunnie Nielsen , who verbally acknowledged these results.   Electronically Signed   By: Richarda Overlie M.D.   On: 10/24/2013 01:34   IV fluids provided and CT scan ordered to further evaluate. Reviewed CT scan with radiologist as above. Repeat exam remains benign. C. Difficile pending.presentation does not suggest bowel obstruction. She is having some liquid stools, on laxatives.   3:20 AM patient is requesting to be discharged. Plan close outpatient followup for evaluation of kidney function and further evaluation of loose stools. GI referral provided.  MDM  Diagnoses: Loose stools on laxatives, history of constipation  Evaluated with labs and imaging reviewed as above. Treated with IV fluids. Vital signs and nurses notes reviewed and considered. Serial evaluations with abdominal pain or tenderness.    Sunnie Nielsen, MD 10/24/13 [phone]

## 2013-10-24 ENCOUNTER — Emergency Department (HOSPITAL_COMMUNITY): Payer: Medicare Other

## 2013-10-24 DIAGNOSIS — K5900 Constipation, unspecified: Secondary | ICD-10-CM | POA: Insufficient documentation

## 2013-10-24 DIAGNOSIS — R195 Other fecal abnormalities: Secondary | ICD-10-CM | POA: Insufficient documentation

## 2013-10-24 LAB — POCT I-STAT 3, ART BLOOD GAS (G3+)
Acid-base deficit: 7 mmol/L — ABNORMAL HIGH (ref 0.0–2.0)
Bicarbonate: 17.2 mEq/L — ABNORMAL LOW (ref 20.0–24.0)
O2 Saturation: 97 %
Patient temperature: 98.9
TCO2: 18 mmol/L (ref 0–100)
pH, Arterial: 7.353 (ref 7.350–7.450)
pO2, Arterial: 91 mmHg (ref 80.0–100.0)

## 2013-10-24 MED ORDER — IOHEXOL 300 MG/ML  SOLN
100.0000 mL | Freq: Once | INTRAMUSCULAR | Status: AC | PRN
  Administered 2013-10-24: 100 mL via INTRAVENOUS

## 2013-10-24 MED ORDER — SODIUM CHLORIDE 0.9 % IV BOLUS (SEPSIS)
1000.0000 mL | Freq: Once | INTRAVENOUS | Status: AC
  Administered 2013-10-24: 1000 mL via INTRAVENOUS

## 2013-10-24 NOTE — ED Notes (Signed)
Patient finished contrast. CT paged 

## 2013-11-02 ENCOUNTER — Encounter: Payer: Self-pay | Admitting: Internal Medicine

## 2013-11-02 ENCOUNTER — Non-Acute Institutional Stay (SKILLED_NURSING_FACILITY): Payer: Medicare Other | Admitting: Internal Medicine

## 2013-11-02 DIAGNOSIS — E1149 Type 2 diabetes mellitus with other diabetic neurological complication: Secondary | ICD-10-CM

## 2013-11-02 DIAGNOSIS — L03031 Cellulitis of right toe: Secondary | ICD-10-CM

## 2013-11-02 DIAGNOSIS — D649 Anemia, unspecified: Secondary | ICD-10-CM

## 2013-11-02 DIAGNOSIS — G909 Disorder of the autonomic nervous system, unspecified: Secondary | ICD-10-CM

## 2013-11-02 DIAGNOSIS — L02619 Cutaneous abscess of unspecified foot: Secondary | ICD-10-CM

## 2013-11-02 DIAGNOSIS — K5900 Constipation, unspecified: Secondary | ICD-10-CM

## 2013-11-02 DIAGNOSIS — M109 Gout, unspecified: Secondary | ICD-10-CM

## 2013-11-02 DIAGNOSIS — E1143 Type 2 diabetes mellitus with diabetic autonomic (poly)neuropathy: Secondary | ICD-10-CM

## 2013-11-02 DIAGNOSIS — IMO0001 Reserved for inherently not codable concepts without codable children: Secondary | ICD-10-CM

## 2013-11-02 DIAGNOSIS — F3289 Other specified depressive episodes: Secondary | ICD-10-CM

## 2013-11-02 DIAGNOSIS — I10 Essential (primary) hypertension: Secondary | ICD-10-CM

## 2013-11-02 DIAGNOSIS — F329 Major depressive disorder, single episode, unspecified: Secondary | ICD-10-CM

## 2013-11-02 NOTE — Progress Notes (Signed)
Patient ID: [name], female   DOB: [birth year], 57 y.o.   MRN: [id number]    Lauren Barber living [facility]  Chief Complaint  Patient presents with  . Medical Managment of Chronic Issues   No Known Allergies  HPI  57 Y/o female patient is seen today for routine visit.she has been having drainage from her right great toe nail bed area for few days. She has been having bowel movement on a daily basis at present. No bowel movement today. Has been passing gas. No other complaints. Mostly in bed.  Review of Systems  Constitutional: Negative for malaise/fatigue.  Respiratory: Negative for cough and shortness of breath.   Cardiovascular: Negative for chest pain, palpitations and leg swelling.  Gastrointestinal: Negative for heartburn, abdominal pain, nausea and vomiting Musculoskeletal: Negative for myalgias and back pain.  Neurological: Negative for headaches.  Psychiatric/Behavioral: Negative for depression. The patient is not nervous/anxious.    Past Medical History  Diagnosis Date  . Hypertension   . Obesities, morbid   . Stroke   . CVA (cerebral vascular accident)   . Vaginal bleeding   . Endometriosis   . Atopic conjunctivitis   . Diabetes mellitus without complication   . Cellulitis   . Insomnia   . Cardiomegaly   . Degenerative, intervertebral disc   . Depressive disorder   . Gout    Medication reviewed. See [facility]  Physical Exam  BP 114/66  Pulse 88  Temp(Src) 97.9 F (36.6 C)  Resp 18  Ht 5\' 9"  (1.753 m)  Wt 331 lb (150.141 kg)  BMI 48.86 kg/m2  SpO2 98%  Constitutional: She is oriented to person, place, and time. Morbidly obese  Neck: Neck supple. No JVD present. No thyromegaly present.  Cardiovascular: Normal rate, regular rhythm and intact distal pulses.   Respiratory: Effort normal and breath sounds normal. No respiratory distress. She has no wheezes.  GI: Soft. Bowel sounds are normal. She exhibits no distension. There is no tenderness.  Musculoskeletal:  She exhibits no edema. Has limited range of motion due to her obesity   Neurological: She is alert and oriented to person, place, and time.  Skin: Skin is warm and dry. Has purulent drainage mixed with blood from right great toe nail bed. Denies tenderness on palpation. Wound culture obtained. Normal skin color otherwise and palpable dorsalis pedis. Psychiatric: She has a normal mood and affect.   LABS REVIEWED:   06-29-13: uric acid 7.3 07-06-13: wbc 6.4; hgb 9.5; hct 29.5; mcv 91.3; plt 346; glucose 104; bun 24; creat 1.42; k+4.2; na++136; liver normal albumin 3.8; chol 158; ldl 96; trig 174; hgb a1c 6.5   07-07-13: micro-albumin 1.53   Assessment/plan  Toe cellulitis Sent wound culture, follow culture report. Will start her on doxycycline 100 mg bid for now. Also will obtain xray of right great toe to rule out osteomyelitis. Skin care to be provided. Monitor for fever and leukocytosis. Check cbc, cmp  Peripheral neuropathy continue neurontin 300 mg three times daily with flexeril 10 mg three times daily as needed. Monitor  Diabetes mellitus type 2, uncontrolled, without complications Check a1c. Continue metformin er 500 mg daily , levemir 40 units daily and novolog 7 units prior to meals. Monitor cbg with need for good glucose control in setting of toe infection  Constipation Has been having regular bowel movement. Continue lactulose but hold for loose stool  Gout No recent flare up. continue allopurinol 100 mg daily and colchicine 0.6 mg daily and will monitor  Anemia Stable h/h. Stop ferrous sulfate. Monitor cbc periodically  Depression Mood has remained stable.l continue prozac 60 mg daily and wellbutrin 75 mg daily. Monitor mood  Post-menopausal bleeding Continue megace, no further bleed reported  Hypertension Stable at present. Continue lisinopril and lasix   Spent more than 50 minutes in patient care

## 2013-11-09 ENCOUNTER — Encounter: Payer: Self-pay | Admitting: Internal Medicine

## 2013-11-10 ENCOUNTER — Other Ambulatory Visit: Payer: Self-pay | Admitting: Internal Medicine

## 2013-11-10 MED ORDER — LEVOFLOXACIN 500 MG PO TABS: 500.0000 mg | ORAL_TABLET | Freq: Every day | ORAL | Status: DC

## 2013-11-10 NOTE — Progress Notes (Signed)
Sensitivities returned for wound culture from toe wound.  Change doxycycline to levaquin 500mg  daily for 7 days beginning today.

## 2013-12-02 DIAGNOSIS — L03031 Cellulitis of right toe: Secondary | ICD-10-CM | POA: Insufficient documentation

## 2013-12-02 DIAGNOSIS — E1143 Type 2 diabetes mellitus with diabetic autonomic (poly)neuropathy: Secondary | ICD-10-CM | POA: Insufficient documentation

## 2013-12-04 ENCOUNTER — Non-Acute Institutional Stay (SKILLED_NURSING_FACILITY): Payer: Medicare Other | Admitting: Internal Medicine

## 2013-12-04 DIAGNOSIS — L03039 Cellulitis of unspecified toe: Secondary | ICD-10-CM

## 2013-12-04 DIAGNOSIS — L03031 Cellulitis of right toe: Secondary | ICD-10-CM

## 2013-12-04 DIAGNOSIS — G629 Polyneuropathy, unspecified: Secondary | ICD-10-CM

## 2013-12-04 DIAGNOSIS — G609 Hereditary and idiopathic neuropathy, unspecified: Secondary | ICD-10-CM

## 2013-12-04 DIAGNOSIS — M109 Gout, unspecified: Secondary | ICD-10-CM

## 2013-12-04 DIAGNOSIS — K5900 Constipation, unspecified: Secondary | ICD-10-CM

## 2013-12-04 DIAGNOSIS — L02619 Cutaneous abscess of unspecified foot: Secondary | ICD-10-CM

## 2013-12-04 DIAGNOSIS — E1165 Type 2 diabetes mellitus with hyperglycemia: Principal | ICD-10-CM

## 2013-12-04 DIAGNOSIS — IMO0001 Reserved for inherently not codable concepts without codable children: Secondary | ICD-10-CM

## 2013-12-04 NOTE — Progress Notes (Signed)
Patient ID: Lauren Barber, female   DOB: [birth year], 58 y.o.   MRN: [id number]    [name] living [facility]  Chief Complaint  Patient presents with  . Medical Managment of Chronic Issues   No Known Allergies  HPI 58 Y/o female patient is seen today for routine visit.she has been seen by podiatry and had right great toe nail removed and continues to be on doxycycline. Remains afebrile. Her cbg have been suggestive of good sugar control. No complaints from patient today. No new concern from staff Patient is mostly in bed. linzess has been working well and she has regular bowel movement now.   Review of Systems   Constitutional: Negative for malaise/fatigue.   Respiratory: Negative for cough and shortness of breath.    Cardiovascular: Negative for chest pain, palpitations and leg swelling.   Gastrointestinal: Negative for heartburn, abdominal pain, nausea and vomiting Musculoskeletal: Negative for myalgias and back pain.   Neurological: Negative for headaches.   Psychiatric/Behavioral: Negative for depression. The patient is not nervous/anxious.      Past Medical History  Diagnosis Date  . Hypertension   . Obesities, morbid   . Stroke   . CVA (cerebral vascular accident)   . Vaginal bleeding   . Endometriosis   . Atopic conjunctivitis   . Diabetes mellitus without complication   . Cellulitis   . Insomnia   . Cardiomegaly   . Degenerative, intervertebral disc   . Depressive disorder   . Gout    Medication reviewed. See [facility]  Physical exam 108/66, 88, 18, 93%  Constitutional: She is oriented to person, place, and time. Morbidly obese   Neck: Neck supple. No JVD present. No thyromegaly present.   Cardiovascular: Normal rate, regular rhythm and intact distal pulses.    Respiratory: Effort normal and breath sounds normal. No respiratory distress. She has no wheezes.   GI: Soft. Bowel sounds are normal. She exhibits no distension. There is no tenderness.  Musculoskeletal: She  exhibits no edema. Has limited range of motion due to her obesity   Neurological: She is alert and oriented to person, place, and time.   Skin: Skin is warm and dry. Dressing on right great toe dry and clean Psychiatric: She has a normal mood and affect.   LABS REVIEWED:   06-29-13: uric acid 7.3 07-06-13: wbc 6.4; hgb 9.5; hct 29.5; mcv 91.3; plt 346; glucose 104; bun 24; creat 1.42; k+4.2; na++136; liver normal albumin 3.8; chol 158; ldl 96; trig 174; hgb a1c 6.5   07-07-13: micro-albumin 1.53  11-04-13 a1c 5.4  Assessment/plan  Dm type 2 Controlled on review of cbg and a1c. Will d/c levemir and metformin. Will change novolog to 5 u for cbg > 200 for now.   Gout No recent flare up. continue allopurinol 100 mg daily and colchicine 0.6 mg daily and will monitor   Toe cellulitis Continue doxycycline 100 mg bid until 12/10/13  Peripheral neuropathy continue neurontin 300 mg three times daily with flexeril 10 mg three times daily as needed. Monitor  Constipation Tolerating linzess well. Regular bowel movement. Continue lactulose

## 2013-12-05 DIAGNOSIS — G629 Polyneuropathy, unspecified: Secondary | ICD-10-CM | POA: Insufficient documentation

## 2014-01-08 ENCOUNTER — Encounter: Payer: Self-pay | Admitting: Internal Medicine

## 2014-01-08 ENCOUNTER — Non-Acute Institutional Stay (SKILLED_NURSING_FACILITY): Payer: Medicare Other | Admitting: Internal Medicine

## 2014-01-08 DIAGNOSIS — K5900 Constipation, unspecified: Secondary | ICD-10-CM

## 2014-01-08 DIAGNOSIS — G909 Disorder of the autonomic nervous system, unspecified: Secondary | ICD-10-CM

## 2014-01-08 DIAGNOSIS — I10 Essential (primary) hypertension: Secondary | ICD-10-CM

## 2014-01-08 DIAGNOSIS — E1165 Type 2 diabetes mellitus with hyperglycemia: Secondary | ICD-10-CM

## 2014-01-08 DIAGNOSIS — E1143 Type 2 diabetes mellitus with diabetic autonomic (poly)neuropathy: Secondary | ICD-10-CM

## 2014-01-08 DIAGNOSIS — R609 Edema, unspecified: Secondary | ICD-10-CM

## 2014-01-08 DIAGNOSIS — M109 Gout, unspecified: Secondary | ICD-10-CM

## 2014-01-08 DIAGNOSIS — E1149 Type 2 diabetes mellitus with other diabetic neurological complication: Secondary | ICD-10-CM

## 2014-01-08 DIAGNOSIS — IMO0001 Reserved for inherently not codable concepts without codable children: Secondary | ICD-10-CM

## 2014-01-08 NOTE — Progress Notes (Signed)
Patient ID: Lauren Barber, female   DOB: [birth year], 58 y.o.   MRN: [id number]    [name] living [employer]  Chief Complaint  Patient presents with  . Medical Managment of Chronic Issues    routine visit   No Known Allergies  HPI 58 y/o female patient is seen for routine visit. She denies any complaints this visit.   Review of Systems   Constitutional: Negative for malaise/fatigue.   Respiratory: Negative for cough and shortness of breath.    Cardiovascular: Negative for chest pain, palpitations and leg swelling.   Gastrointestinal: Negative for heartburn, abdominal pain, nausea and vomiting Musculoskeletal: Negative for myalgias and back pain.   Neurological: Negative for headaches.   Psychiatric/Behavioral: Negative for depression. The patient is not nervous/anxious.       Past Medical History  Diagnosis Date  . Hypertension   . Obesities, morbid   . Stroke   . CVA (cerebral vascular accident)   . Vaginal bleeding   . Endometriosis   . Atopic conjunctivitis   . Diabetes mellitus without complication   . Cellulitis   . Insomnia   . Cardiomegaly   . Degenerative, intervertebral disc   . Depressive disorder   . Gout    Medication reviewed. See [facility]  Physical exam BP 108/65  Pulse 90  Temp(Src) 97.9 F (36.6 C)  Resp 18  Wt 329 lb (149.233 kg)  SpO2 98%  Constitutional: She is oriented to person, place, and time. Morbidly obese   Neck: Neck supple. No JVD present. No thyromegaly present.   Cardiovascular: Normal rate, regular rhythm and intact distal pulses.    Respiratory: Effort normal and breath sounds normal. No respiratory distress. She has no wheezes.   GI: Soft. Bowel sounds are normal. She exhibits no distension. There is no tenderness.  Musculoskeletal: She exhibits no edema. Has limited range of motion due to her obesity   Neurological: She is alert and oriented to person, place, and time.   Skin: Skin is warm and dry. Dressing on right great toe dry  and clean Psychiatric: She has a normal mood and affect.   LABS REVIEWED:   06-29-13: uric acid 7.3 07-06-13: wbc 6.4; hgb 9.5; hct 29.5; mcv 91.3; plt 346; glucose 104; bun 24; creat 1.42; k+4.2; na++136; liver normal albumin 3.8; chol 158; ldl 96; trig 174; hgb a1c 6.5   07-07-13: micro-albumin 1.53   11-04-13 a1c 5.4  Assessment/plan  Peripheral neuropathy continue neurontin 300 mg three times daily with flexeril 10 mg three times daily as needed. Monitor  Constipation Continue lactulose  Dm type 2 Controlled on review of cbg and a1c. continue novolog to 5 u for cbg > 200 for now.   Hypertension Continue lisinopril for now  Depression Stable mood. Continue wellbutrin 75 mg daily  Gout No recent flare up. continue allopurinol 100 mg daily and colchicine 0.6 mg daily and will monitor   Leg edema On lasix and has been helpful. Monitor bmp. On prn tramadol for pain   Blanchie Serve, MD  [facility] [phone] (Monday-Friday 8 am - 5 pm) [phone] (afterhours)

## 2014-01-26 ENCOUNTER — Other Ambulatory Visit: Payer: Self-pay | Admitting: *Deleted

## 2014-01-26 MED ORDER — TRAMADOL HCL 50 MG PO TABS: 200.0000 mg | ORAL_TABLET | Freq: Two times a day (BID) | ORAL | Status: DC

## 2014-01-26 NOTE — Telephone Encounter (Signed)
[facility] 

## 2014-01-28 ENCOUNTER — Other Ambulatory Visit: Payer: Self-pay | Admitting: *Deleted

## 2014-01-28 MED ORDER — TRAMADOL HCL 50 MG PO TABS: 200.0000 mg | ORAL_TABLET | Freq: Two times a day (BID) | ORAL | Status: DC

## 2014-01-28 NOTE — Telephone Encounter (Signed)
[facility] [place] 

## 2014-04-06 ENCOUNTER — Encounter: Payer: Self-pay | Admitting: Internal Medicine

## 2014-04-06 ENCOUNTER — Non-Acute Institutional Stay (SKILLED_NURSING_FACILITY): Payer: Medicare Other | Admitting: Internal Medicine

## 2014-04-06 DIAGNOSIS — G909 Disorder of the autonomic nervous system, unspecified: Secondary | ICD-10-CM

## 2014-04-06 DIAGNOSIS — E1149 Type 2 diabetes mellitus with other diabetic neurological complication: Secondary | ICD-10-CM

## 2014-04-06 DIAGNOSIS — IMO0001 Reserved for inherently not codable concepts without codable children: Secondary | ICD-10-CM

## 2014-04-06 DIAGNOSIS — F32A Depression, unspecified: Secondary | ICD-10-CM

## 2014-04-06 DIAGNOSIS — R609 Edema, unspecified: Secondary | ICD-10-CM

## 2014-04-06 DIAGNOSIS — N644 Mastodynia: Secondary | ICD-10-CM

## 2014-04-06 DIAGNOSIS — E1165 Type 2 diabetes mellitus with hyperglycemia: Secondary | ICD-10-CM

## 2014-04-06 DIAGNOSIS — F3289 Other specified depressive episodes: Secondary | ICD-10-CM

## 2014-04-06 DIAGNOSIS — F329 Major depressive disorder, single episode, unspecified: Secondary | ICD-10-CM

## 2014-04-06 DIAGNOSIS — N809 Endometriosis, unspecified: Secondary | ICD-10-CM

## 2014-04-06 DIAGNOSIS — E1143 Type 2 diabetes mellitus with diabetic autonomic (poly)neuropathy: Secondary | ICD-10-CM

## 2014-04-06 NOTE — Progress Notes (Signed)
Patient ID: Lauren Barber, female   DOB: 1955-12-20, 58 y.o.   MRN: [id number]  Location:  [facility] Provider:  Rexene Edison. Mariea Clonts, D.O., C.M.D.  Code Status:  Full code  Chief Complaint  Patient presents with  . Medical Management of Chronic Issues  . Acute Visit    right breast tenderness for several weeks, cannot get mammograms as unable to stand up    HPI:  58 yo black female with morbid obesity, diabetes with neurologic complications, constipation, hypertension, endometriosis on megace, depression and edema seen for medical mgt of chronic diseases.   Today, her only complaint is right breast tenderness x several weeks.  She recalls no trauma to the breast.  She is unable to get mammograms due to her weight and inability to stand.  She has NEVER had one.    Review of Systems:  Review of Systems  Constitutional: Negative for fever.  HENT: Negative for congestion.   Eyes: Negative for blurred vision.  Respiratory: Negative for shortness of breath.   Cardiovascular: Negative for chest pain.       Right breast tenderness  Gastrointestinal: Negative for abdominal pain.  Genitourinary: Negative for dysuria.       No vaginal bleeding for a long time now with megace for endometriosis  Musculoskeletal: Negative for falls.  Skin: Negative for rash.  Neurological: Negative for dizziness and loss of consciousness.  Endo/Heme/Allergies: Does not bruise/bleed easily.  Psychiatric/Behavioral: Positive for depression. Negative for memory loss.    Medications: Patient's Medications  New Prescriptions   No medications on file  Previous Medications   ALLOPURINOL (ZYLOPRIM) 100 MG TABLET    Take 1 tablet (100 mg total) by mouth daily.   BUPROPION (WELLBUTRIN) 75 MG TABLET    Take 75 mg by mouth daily.    COLCHICINE 0.6 MG TABLET    Take 0.6 mg by mouth daily.   CYCLOBENZAPRINE (FLEXERIL) 10 MG TABLET    Take 10 mg by mouth 3 (three) times daily as needed. spasms   DIPHENHYDRAMINE (BENADRYL) 25 MG TABLET    Take 25 mg by mouth every 8 (eight) hours as needed for itching. For itching   EPINEPHRINE (EPIPEN) 0.3 MG/0.3 ML DEVI    Inject 0.3 mg into the muscle once.   FLUOXETINE (PROZAC) 20 MG CAPSULE    Take 60 mg by mouth daily.   FUROSEMIDE (LASIX) 40 MG TABLET    Take 40 mg by mouth daily.   GABAPENTIN (NEURONTIN) 300 MG CAPSULE    Take 300 mg by mouth 3 (three) times daily.    INSULIN ASPART (NOVOLOG) 100 UNIT/ML INJECTION    Inject 7 Units into the skin 3 (three) times daily before meals.    LACTULOSE (CHRONULAC) 10 GM/15ML SOLUTION    Take 10 g by mouth 2 (two) times daily.   LISINOPRIL (PRINIVIL,ZESTRIL) 5 MG TABLET    Take 5 mg by mouth daily. Hold for SBP <100   MEGESTROL (MEGACE) 40 MG TABLET    Take 40 mg by mouth daily.   MULTIPLE VITAMINS-MINERALS (MULTIVITAMIN WITH MINERALS) TABLET    Take 1 tablet by mouth daily.   TOLNAFTATE (TINACTIN) 1 % CREAM    Apply 1 application topically 3 (three) times daily. To posterior thighs and gluteal folds after incontinence care   TRAMADOL (ULTRAM) 50 MG TABLET    Take 4 tablets (200 mg total) by mouth every 12 (twelve) hours. For pain   TRIAMCINOLONE OINTMENT (KENALOG) 0.1 %    Apply  1 application topically daily as needed (to legs for dermatitis).  Modified Medications   No medications on file  Discontinued Medications   ACETAMINOPHEN (TYLENOL) 325 MG TABLET    Take 650 mg by mouth every 4 (four) hours as needed for mild pain.    Physical Exam: Filed Vitals:   04/06/14 1821  BP: 134/58  Pulse: 80  Temp: 98.4 F (36.9 C)  Resp: 15  Height: 5\' 9"  (1.753 m)  Weight: 337 lb (152.862 kg)  SpO2: 98%  Physical Exam  Constitutional: She is oriented to person, place, and time. She appears well-developed and well-nourished. No distress.  Morbidly obese black female, stays in bed except to bathe once a week  Cardiovascular: Normal rate, regular rhythm, normal heart sounds and intact distal pulses.     Pulmonary/Chest: Effort normal and breath sounds normal. No respiratory distress.  Diffuse tenderness of entire right breast and axillary area  Abdominal: Soft. Bowel sounds are normal. She exhibits no distension and no mass. There is no tenderness.  Musculoskeletal: Normal range of motion. She exhibits no edema and no tenderness.  Neurological: She is alert and oriented to person, place, and time.  Skin: Skin is warm and dry.  Psychiatric: She has a normal mood and affect.     Labs reviewed: Basic Metabolic Panel:  Recent Labs  10/23/13 2309  NA 139  K 3.6  CL 107  CO2 18*  GLUCOSE 112*  BUN 27*  CREATININE 1.62*  CALCIUM 9.2    Liver Function Tests:  Recent Labs  10/23/13 2309  AST 20  ALT 27  ALKPHOS 94  BILITOT 0.2*  PROT 7.7  ALBUMIN 3.4*    CBC:  Recent Labs  10/23/13 2309  WBC 9.0  HGB 9.5*  HCT 28.6*  MCV 95.3  PLT 303  last labs 2/15  Assessment/Plan 1. Breast pain, right -is new in past few weeks -referred for bilateral breast ultrasounds b/c pt cannot get mammograms due to inability to stand  2. Endometriosis -stable with megace use to prevent bleeding -follows with gyn  3. Diabetes mellitus type 2, uncontrolled, without complications -check JKD3O, bmp, urine microalbumin  4. Peripheral autonomic neuropathy due to DM -no changes, cont to monitor with neurontin use  5. Depression -stable at current time  6. Edema -stable with lasix -f/u bmp  Family/ staff Communication: seen with unit supervisor  Goals of care: full code, long term care resident  Labs/tests ordered:  Ultrasound bilateral breasts due to inability to get mammograms and now with right breast and axillary tenderness;  Cbc, bmp, urine microalbumin, hba1c, uric acid next draw

## 2014-04-16 ENCOUNTER — Non-Acute Institutional Stay (SKILLED_NURSING_FACILITY): Payer: Medicare Other | Admitting: Internal Medicine

## 2014-04-16 DIAGNOSIS — L03032 Cellulitis of left toe: Secondary | ICD-10-CM

## 2014-04-16 DIAGNOSIS — L03039 Cellulitis of unspecified toe: Secondary | ICD-10-CM

## 2014-04-16 DIAGNOSIS — H9202 Otalgia, left ear: Secondary | ICD-10-CM

## 2014-04-16 DIAGNOSIS — H9209 Otalgia, unspecified ear: Secondary | ICD-10-CM

## 2014-04-16 DIAGNOSIS — L02619 Cutaneous abscess of unspecified foot: Secondary | ICD-10-CM

## 2014-04-22 ENCOUNTER — Non-Acute Institutional Stay (SKILLED_NURSING_FACILITY): Payer: Medicare Other | Admitting: Internal Medicine

## 2014-04-22 DIAGNOSIS — L03032 Cellulitis of left toe: Secondary | ICD-10-CM | POA: Insufficient documentation

## 2014-04-22 DIAGNOSIS — L02619 Cutaneous abscess of unspecified foot: Secondary | ICD-10-CM

## 2014-04-22 DIAGNOSIS — L03039 Cellulitis of unspecified toe: Secondary | ICD-10-CM

## 2014-04-22 NOTE — Progress Notes (Signed)
Patient ID: Lauren Barber, female   DOB: [birth year], 58 y.o.   MRN: [id number]    Chief Complaint  Patient presents with  . Acute Visit    left toe wound   No Known Allergies  HPI 58 y/o female patient is seen today for follow up on her left great toe wound. She had xray of her foot showing OA changes and mild soft tissue swelling only. Her wound culture has grown yeast and staphylococcus. She continues to be on doxycycline 100 mg bid. She denies any fever or pain. Staff feels the wound is improving   ROS No chest pain or dyspnea No leg pain Mood has been stable  Past Medical History  Diagnosis Date  . Hypertension   . Obesities, morbid   . Stroke   . CVA (cerebral vascular accident)   . Vaginal bleeding   . Endometriosis   . Atopic conjunctivitis   . Diabetes mellitus without complication   . Cellulitis   . Insomnia   . Cardiomegaly   . Degenerative, intervertebral disc   . Depressive disorder   . Gout    Medication reviewed. See [facility]  Physical exam Vitals are stable, afebrile  gen- adult obese female patient HEENT- no pallor or icterus cvs- ns 1,s2, rrr respi- CTAB Ext- left great toe wound, erythema resolving, no active drainage, good dorsalis pedis  Assessment/plan  Left toe cellulitis- continue doxycyline 100 mg bid for additional 1 week  Left toe fungal infection- will have her on fluconzaole 150 mg po once a week for 3 weeks and reassess

## 2014-05-06 ENCOUNTER — Non-Acute Institutional Stay (SKILLED_NURSING_FACILITY): Payer: Medicare Other | Admitting: Internal Medicine

## 2014-05-06 DIAGNOSIS — L02619 Cutaneous abscess of unspecified foot: Secondary | ICD-10-CM

## 2014-05-06 DIAGNOSIS — M7989 Other specified soft tissue disorders: Secondary | ICD-10-CM

## 2014-05-06 DIAGNOSIS — M109 Gout, unspecified: Secondary | ICD-10-CM

## 2014-05-06 DIAGNOSIS — N938 Other specified abnormal uterine and vaginal bleeding: Secondary | ICD-10-CM

## 2014-05-06 DIAGNOSIS — L03039 Cellulitis of unspecified toe: Secondary | ICD-10-CM

## 2014-05-06 DIAGNOSIS — G8929 Other chronic pain: Secondary | ICD-10-CM

## 2014-05-06 DIAGNOSIS — L03032 Cellulitis of left toe: Secondary | ICD-10-CM

## 2014-05-06 DIAGNOSIS — N949 Unspecified condition associated with female genital organs and menstrual cycle: Secondary | ICD-10-CM

## 2014-05-06 NOTE — Progress Notes (Signed)
Patient ID: Lauren Barber, female   DOB: 1956/08/17, 58 y.o.   MRN: [id number]    Facility: [facility]  Chief Complaint  Patient presents with  . Medical Management of Chronic Issues   No Known Allergies  HPI 58 y/o morbidly obese female patient is a long term care resident of the facility and seen today for routine visit. No further drainage from her great toe. Remains afebrile. No falls reported. Se continues to have some discomfort in her left ear. Her pain persists but somewhat controlled. Does not get out of bed. No complaints today. Reviewed her medications  Review of Systems  Constitutional: Negative for fever, chills, malaise/fatigue and diaphoresis.  HENT: Negative for congestion, hearing loss and sore throat.   Eyes: Negative for blurred vision, double vision and discharge.  Respiratory: Negative for cough, sputum production, shortness of breath and wheezing.   Cardiovascular: Negative for chest pain, palpitations, orthopnea and leg swelling.  Gastrointestinal: Negative for heartburn, nausea, vomiting, abdominal pain Genitourinary: Negative for dysuria, urgency, frequency and flank pain.  Skin: Negative for itching and rash.  Neurological: Negative for dizziness, tingling, focal weakness and headaches.  Psychiatric/Behavioral: Negative for depression and memory loss. The patient is not nervous/anxious.    Past Medical History  Diagnosis Date  . Hypertension   . Obesities, morbid   . Stroke   . CVA (cerebral vascular accident)   . Vaginal bleeding   . Endometriosis   . Atopic conjunctivitis   . Diabetes mellitus without complication   . Cellulitis   . Insomnia   . Cardiomegaly   . Degenerative, intervertebral disc   . Depressive disorder   . Gout    Current Outpatient Prescriptions on File Prior to Visit  Medication Sig Dispense Refill  . allopurinol (ZYLOPRIM) 100 MG tablet Take 1 tablet (100 mg total) by mouth daily.  30 tablet  0  .  buPROPion (WELLBUTRIN) 75 MG tablet Take 75 mg by mouth daily.       . colchicine 0.6 MG tablet Take 0.6 mg by mouth daily.      . cyclobenzaprine (FLEXERIL) 10 MG tablet Take 10 mg by mouth 3 (three) times daily as needed. spasms      . diphenhydrAMINE (BENADRYL) 25 MG tablet Take 25 mg by mouth every 8 (eight) hours as needed for itching. For itching      . EPINEPHrine (EPIPEN) 0.3 mg/0.3 mL DEVI Inject 0.3 mg into the muscle once.      Marland Kitchen FLUoxetine (PROZAC) 20 MG capsule Take 60 mg by mouth daily.      . furosemide (LASIX) 40 MG tablet Take 40 mg by mouth daily.      Marland Kitchen gabapentin (NEURONTIN) 300 MG capsule Take 900 mg by mouth 3 (three) times daily.       . insulin aspart (NOVOLOG) 100 UNIT/ML injection Inject 7 Units into the skin 3 (three) times daily before meals.       . lactulose (CHRONULAC) 10 GM/15ML solution Take 10 g by mouth 2 (two) times daily.      Marland Kitchen lisinopril (PRINIVIL,ZESTRIL) 5 MG tablet Take 5 mg by mouth daily. Hold for SBP <100      . megestrol (MEGACE) 40 MG tablet Take 40 mg by mouth daily.      . Multiple Vitamins-Minerals (MULTIVITAMIN WITH MINERALS) tablet Take 1 tablet by mouth daily.      . traMADol (ULTRAM) 50 MG tablet Take 4 tablets (200 mg total) by mouth every 12 (  twelve) hours. For pain  240 tablet  5  . triamcinolone ointment (KENALOG) 0.1 % Apply 1 application topically daily as needed (to legs for dermatitis).       No current facility-administered medications on file prior to visit.    Physical exam BP 111/81  Pulse 88  Temp(Src) 98.2 F (36.8 C)  Resp 18  Ht 5\' 9"  (1.753 m)  Wt 345 lb (156.491 kg)  BMI 50.92 kg/m2  SpO2 94%  Constitutional: She is oriented to person, place, and time. Morbidly obese   Neck: Neck supple. No JVD present. No thyromegaly present.   Cardiovascular: Normal rate, regular rhythm and intact distal pulses.    Respiratory: Effort normal and breath sounds normal. No respiratory distress. She has no wheezes.   GI: Soft.  Bowel sounds are normal. She exhibits no distension. There is no tenderness.  Musculoskeletal: Right leg ice cold to touch below knee, non tender on exam, no discoloration, increased swelling compared to left leg, good dorsalis pedis, popliteal pulse and femoral pulse bilaterally Neurological: She is alert and oriented to person, place, and time.   Skin: Skin is warm and dry. Good capillary refill Psychiatric: She has a normal mood and affect.   LABS REVIEWED:   06-29-13: uric acid 7.3 07-06-13: wbc 6.4; hgb 9.5; hct 29.5; mcv 91.3; plt 346; glucose 104; bun 24; creat 1.42; k+4.2; na++136; liver normal albumin 3.8; chol 158; ldl 96; trig 174; hgb a1c 6.5   07-07-13: micro-albumin 1.53   11-04-13 a1c 5.4 04/09/14 wbc 7.2, hb 9.3, hct 27.9, plt 295, na 139, k 4.1, glu 97, bun 24, cr 1.76, ca 9.1, uric acid 7.6, a1c 5.6   Assessment/plan  Right leg swelling With change of temperature, new asymmetrical swelling, limited mobility, obesity and her being on megace will get stat venous doppler to rule out DVT. Once dvt is ruled out, will get ABI given her change of temperature. No claudication reported. Hold megace for now  Gout Will increase allopurinol to 200 mg daily given uric acid level. Continue colcrys  Left toe cellulitis Infection has resolved and would healing well. D/c fluconazole. Completed antibiotics  Chronic pain under control. Decrease gabapentin to 600 mg tid from 900 mg tid. Continue tramadol 200 mg bid  DUB  in setting of Endometriosis and endometrial bleed. Bleeding currently under control with her current dose of megace 40 mg daily. With new leg swelling and concern of dvt, megace can contribute to this along with her immobility. Will get a gyn referral for follow up with Dr Veleta Miners to help assess for stopping megace vs adjustment after further evaluation of endometrial lining. Will hold megace for now until dvt is ruled out   Blanchie Serve, MD  [place] (Monday-Friday 8 am - 5 pm) [phone] (afterhours)

## 2014-05-10 NOTE — Progress Notes (Signed)
Patient ID: Lauren Barber, female   DOB: 1956/02/14, 58 y.o.   MRN: [id number]    Facility: [facility]  Chief Complaint  Patient presents with  . Acute Visit    left great toe pus drainage   No Known Allergies  HPI 58 y/o female patient is seen today for pus drainage from her left great toe. She is morbidly obese and is bed bound. She has history of DM. She denies any known trauma or injury. Denies cutting her toe nail recently. CNA noticed this while cleaning her this am. She complaints of discomfort in her throat and left ear ache. Denies any fever or chills  ROS Denies any other complaints  Past Medical History  Diagnosis Date  . Hypertension   . Obesities, morbid   . Stroke   . CVA (cerebral vascular accident)   . Vaginal bleeding   . Endometriosis   . Atopic conjunctivitis   . Diabetes mellitus without complication   . Cellulitis   . Insomnia   . Cardiomegaly   . Degenerative, intervertebral disc   . Depressive disorder   . Gout    Medication reviewed. See [facility]  Physical exam Vitals reviewed. Afebrile BP 134/60  Pulse 80  Temp(Src) 98.4 F (36.9 C)  Resp 15   General- adult female in no acute distress cvs- normal s1,s2, rrr Ear- normal external ear canal, normal tympanic membrane Neck- has left anterior cervical lymphadenopathy Ext- left great toe has purulent drainage, good dorsalis pedis, normal capillary refill, great toe warm to touch, no tenderness illicited, pus drainage from nail bed area, normal skin color, right toes and other digits on left foot normal Neuro- alert and oriented  Assessment/plan  Left toe cellulitis Left great toe wound culture sent. Given her history of dm will rule out osteomyelitis with xray. Doxycycline 100 mg bid for a week with florastor 250 mg bid for 2 weeks. Reassess if no improvement  Left earache Normal ear exam. Possible mild URI with earache and palpable tender lymph node. The doxycyline should  provide coverage for this as well. Monitor clinically

## 2014-05-13 DIAGNOSIS — N938 Other specified abnormal uterine and vaginal bleeding: Secondary | ICD-10-CM | POA: Insufficient documentation

## 2014-05-18 ENCOUNTER — Other Ambulatory Visit: Payer: Self-pay | Admitting: *Deleted

## 2014-05-18 DIAGNOSIS — N939 Abnormal uterine and vaginal bleeding, unspecified: Secondary | ICD-10-CM

## 2014-05-19 ENCOUNTER — Telehealth: Payer: Self-pay | Admitting: *Deleted

## 2014-05-19 NOTE — Telephone Encounter (Signed)
Spoke with Marlowe Shores her nurse and confirmed ultrasound appointment, May 26, 2014 @ 1245.  Pt needs full bladder and arrive 15 mins early.  Nurse verbalizes understanding.

## 2014-05-24 ENCOUNTER — Telehealth: Payer: Self-pay

## 2014-05-24 NOTE — Telephone Encounter (Signed)
[place] and spoke to [place] the scheduler-- she stated they were already aware of patient's appointment 05/26/14. Informed her they will hear from [place] if another appointment is to be scheduled. No questions or concerns.

## 2014-05-24 NOTE — Telephone Encounter (Signed)
Message copied by Geanie Logan on Mon May 24, 2014 10:23 AM ------      Message from: Excell Seltzer      Created: Thu May 20, 2014  5:40 PM      Regarding: Please call patient with appointment       This patient is seen by Dr. Ihor Dow.  She has heavy bleeding and is on megace.  She has been scheduled for an U/S on 05/26/14 at 12:45 pm to assess endometrial stripe.  I am not sure the patient was called with this appointment.  Please call her and make her aware.  Once Dr. Ihor Dow sees the results she will make a determination if the patient needs to be seen by her in the office.  Patient lives at [facility] phone [phone].  I will follow-up with [facility] on 05/31/14 to see if we need to schedule an appointment for the patient.  She is non-ambulatory and comes in via ambulance.      Thanks,      Claiborne Billings ------

## 2014-05-26 ENCOUNTER — Ambulatory Visit (HOSPITAL_COMMUNITY)
Admission: RE | Admit: 2014-05-26 | Discharge: 2014-05-26 | Disposition: A | Discharge status: Home / Self Care | Payer: Medicare Other | Source: Ambulatory Visit | Attending: Obstetrics & Gynecology | Admitting: Obstetrics & Gynecology

## 2014-05-26 ENCOUNTER — Other Ambulatory Visit: Payer: Self-pay | Admitting: Vascular Surgery

## 2014-05-26 DIAGNOSIS — N925 Other specified irregular menstruation: Secondary | ICD-10-CM | POA: Insufficient documentation

## 2014-05-26 DIAGNOSIS — N939 Abnormal uterine and vaginal bleeding, unspecified: Secondary | ICD-10-CM

## 2014-05-26 DIAGNOSIS — N949 Unspecified condition associated with female genital organs and menstrual cycle: Secondary | ICD-10-CM | POA: Diagnosis present

## 2014-05-26 DIAGNOSIS — N938 Other specified abnormal uterine and vaginal bleeding: Secondary | ICD-10-CM | POA: Insufficient documentation

## 2014-05-28 ENCOUNTER — Non-Acute Institutional Stay (SKILLED_NURSING_FACILITY): Payer: Medicare Other | Admitting: Internal Medicine

## 2014-05-28 ENCOUNTER — Other Ambulatory Visit: Payer: Self-pay | Admitting: *Deleted

## 2014-05-28 ENCOUNTER — Encounter: Payer: Self-pay | Admitting: Vascular Surgery

## 2014-05-28 ENCOUNTER — Other Ambulatory Visit: Payer: Self-pay

## 2014-05-28 DIAGNOSIS — N938 Other specified abnormal uterine and vaginal bleeding: Secondary | ICD-10-CM

## 2014-05-28 DIAGNOSIS — N949 Unspecified condition associated with female genital organs and menstrual cycle: Secondary | ICD-10-CM

## 2014-05-28 DIAGNOSIS — R600 Localized edema: Secondary | ICD-10-CM

## 2014-05-28 DIAGNOSIS — N925 Other specified irregular menstruation: Secondary | ICD-10-CM

## 2014-05-28 DIAGNOSIS — R609 Edema, unspecified: Secondary | ICD-10-CM

## 2014-05-28 DIAGNOSIS — R238 Other skin changes: Secondary | ICD-10-CM

## 2014-05-28 NOTE — Progress Notes (Signed)
Patient ID: Lauren Barber, female   DOB: 08-20-1956, 58 y.o.   MRN: [id number]    Facility: [facility]  Chief Complaint  Patient presents with  . Acute Visit    new left leg swelling   No Known Allergies  HPI 58 y/o morbidly obese female patient is a long term care resident of the facility and seen today for acute visit. Staff have noticed new left leg swelling. Does not get out of bed. Her megace has been on hold. She has pending surgical appointment on 06/11/14 for assessment for PVD. She is currently on eliquis 5 mg bid for possible DVT (ultrasound could not rule it out completely) in right leg from 05/15/14  Review of Systems  Constitutional: Negative for fever, chills, malaise/fatigue and diaphoresis.  HENT: Negative for congestion, hearing loss and sore throat.   Eyes: Negative for blurred vision, double vision and discharge.  Respiratory: Negative for cough, sputum production, shortness of breath and wheezing.   Cardiovascular: Negative for chest pain, palpitations, orthopnea  Gastrointestinal: Negative for heartburn, nausea, vomiting, abdominal pain Genitourinary: Negative for dysuria, urgency, frequency and flank pain.  Skin: Negative for itching and rash.  Neurological: Negative for dizziness, tingling, focal weakness and headaches.  Psychiatric/Behavioral: Negative for depression and memory loss. The patient is not nervous/anxious.   Physical exam Vss, afebrile Constitutional: She is oriented to person, place, and time. Morbidly obese   Neck: Neck supple. No JVD present. No thyromegaly present.   Cardiovascular: Normal rate, regular rhythm and intact distal pulses.    Respiratory: Effort normal and breath sounds normal. No respiratory distress. She has no wheezes.   GI: Soft. Bowel sounds are normal. She exhibits no distension. There is no tenderness. Pannus present Musculoskeletal: left leg swollen > right, no ischemic changes noted, no open sores,  warm to touch, trace edema in both legs. Non tender and good dorsalis pedis Neurological: She is alert and oriented to person, place, and time.   Skin: Skin is warm and dry. Good capillary refill Psychiatric: She has a normal mood and affect.   LABS REVIEWED:   06-29-13: uric acid 7.3 07-06-13: wbc 6.4; hgb 9.5; hct 29.5; mcv 91.3; plt 346; glucose 104; bun 24; creat 1.42; k+4.2; na++136; liver normal albumin 3.8; chol 158; ldl 96; trig 174; hgb a1c 6.5   07-07-13: micro-albumin 1.53   11-04-13 a1c 5.4 04/09/14 wbc 7.2, hb 9.3, hct 27.9, plt 295, na 139, k 4.1, glu 97, bun 24, cr 1.76, ca 9.1, uric acid 7.6, a1c 5.6   Assessment/plan  Left leg swelling Unclear of the cause. ABI reviewed no clinically significant arterial occlusive disease in either leg. Already on eliquis for dvt and off megace. Will wait on input from vascular. Her pain is under control. continue gabapentin 600 mg tid and tramadol 200 mg bid  DUB No bleed reported. Off megace for now. Has follow up with gyn

## 2014-05-31 ENCOUNTER — Telehealth: Payer: Self-pay

## 2014-05-31 NOTE — Telephone Encounter (Signed)
Endometrial biopsy needed and Dr. Ihor Dow would like to do in OR, due to patient's mobility limitations, if patient agrees. Called patient to discuss endo bx in OR and explained it would help with positioning and patient comfort. Patient verbalized understanding and stated that would not be a problem. Informed patient we will be calling with an appointment. Patient verbalized understanding. No questions or concerns.

## 2014-06-01 ENCOUNTER — Encounter: Payer: Self-pay | Admitting: *Deleted

## 2014-06-09 ENCOUNTER — Non-Acute Institutional Stay (SKILLED_NURSING_FACILITY): Payer: Medicare Other | Admitting: Internal Medicine

## 2014-06-09 ENCOUNTER — Encounter: Payer: Self-pay | Admitting: Internal Medicine

## 2014-06-09 DIAGNOSIS — L03031 Cellulitis of right toe: Secondary | ICD-10-CM

## 2014-06-09 DIAGNOSIS — L02619 Cutaneous abscess of unspecified foot: Secondary | ICD-10-CM

## 2014-06-09 DIAGNOSIS — L03039 Cellulitis of unspecified toe: Secondary | ICD-10-CM

## 2014-06-09 NOTE — Progress Notes (Signed)
Patient ID: Lauren Barber, female   DOB: 1956/09/22, 58 y.o.   MRN: [id number]  Location:  [facility] Provider:  Rexene Edison. Mariea Clonts, D.O., C.M.D.  Code Status:  Full code  Chief Complaint  Patient presents with  . Acute Visit    left great toe "infected again"    HPI:  58 yo black female here for long term care was seen due to concerns that her left great toe was infected again.  She has redness and swelling around the toenail.  She has been getting topical antifungal cream to her legs and feet due to athletes' foot concerns.  She's previously had paronychia.  She also has a gout history and historical dietary noncompliance.  Review of Systems:  ROS  Medications: Patient's Medications  New Prescriptions   No medications on file  Previous Medications   ALLOPURINOL (ZYLOPRIM) 100 MG TABLET    Take 1 tablet (100 mg total) by mouth daily.   BUPROPION (WELLBUTRIN) 75 MG TABLET    Take 75 mg by mouth daily.    COLCHICINE 0.6 MG TABLET    Take 0.6 mg by mouth daily.   CYCLOBENZAPRINE (FLEXERIL) 10 MG TABLET    Take 10 mg by mouth 3 (three) times daily as needed. spasms   DIPHENHYDRAMINE (BENADRYL) 25 MG TABLET    Take 25 mg by mouth every 8 (eight) hours as needed for itching. For itching   EPINEPHRINE (EPIPEN) 0.3 MG/0.3 ML DEVI    Inject 0.3 mg into the muscle once.   FLUOXETINE (PROZAC) 20 MG CAPSULE    Take 60 mg by mouth daily.   FUROSEMIDE (LASIX) 40 MG TABLET    Take 40 mg by mouth daily.   GABAPENTIN (NEURONTIN) 300 MG CAPSULE    Take 900 mg by mouth 3 (three) times daily.    INSULIN ASPART (NOVOLOG) 100 UNIT/ML INJECTION    Inject 7 Units into the skin 3 (three) times daily before meals.    LACTULOSE (CHRONULAC) 10 GM/15ML SOLUTION    Take 10 g by mouth 2 (two) times daily.   LISINOPRIL (PRINIVIL,ZESTRIL) 5 MG TABLET    Take 5 mg by mouth daily. Hold for SBP <100   MEGESTROL (MEGACE) 40 MG TABLET    Take 40 mg by mouth daily.   MULTIPLE VITAMINS-MINERALS  (MULTIVITAMIN WITH MINERALS) TABLET    Take 1 tablet by mouth daily.   TRAMADOL (ULTRAM) 50 MG TABLET    Take 4 tablets (200 mg total) by mouth every 12 (twelve) hours. For pain   TRIAMCINOLONE OINTMENT (KENALOG) 0.1 %    Apply 1 application topically daily as needed (to legs for dermatitis).  Modified Medications   No medications on file  Discontinued Medications   No medications on file    Physical Exam: Filed Vitals:   06/09/14 0915  BP: 130/72  Pulse: 82  Temp: 98.5 F (36.9 C)  Resp: 20  Height: 5\' 9"  (1.753 m)  Weight: 344 lb (156.037 kg)  SpO2: 98%  Physical Exam  Constitutional: She is oriented to person, place, and time.  Obese black female resting in bed  Cardiovascular: Normal rate, regular rhythm, normal heart sounds and intact distal pulses.   Pulmonary/Chest: Effort normal and breath sounds normal. No respiratory distress.  Abdominal: Soft. Bowel sounds are normal.  Musculoskeletal: Normal range of motion.  Neurological: She is alert and oriented to person, place, and time.  Skin:  Right great toe with erythema and warmth of great toe with drainage  around toenail--triple abx ointment has been applied     Labs reviewed: Basic Metabolic Panel:  Recent Labs  10/23/13 2309  NA 139  K 3.6  CL 107  CO2 18*  GLUCOSE 112*  BUN 27*  CREATININE 1.62*  CALCIUM 9.2    Liver Function Tests:  Recent Labs  10/23/13 2309  AST 20  ALT 27  ALKPHOS 94  BILITOT 0.2*  PROT 7.7  ALBUMIN 3.4*    CBC:  Recent Labs  10/23/13 2309  WBC 9.0  HGB 9.5*  HCT 28.6*  MCV 95.3  PLT 303   Assessment/Plan 1. Cellulitis of great toe of right foot -treat with one week of keflex, monitor for worsening or spreading -assessment of circulation has not been effective due to morbid obesity--vessels not well visualized   Family/ staff Communication: seen with unit supervisor  Goals of care: full code, long term care  Labs/tests ordered:  No new

## 2014-06-10 ENCOUNTER — Encounter: Payer: Self-pay | Admitting: Vascular Surgery

## 2014-06-11 ENCOUNTER — Ambulatory Visit (INDEPENDENT_AMBULATORY_CARE_PROVIDER_SITE_OTHER): Payer: Medicare Other | Admitting: Vascular Surgery

## 2014-06-11 ENCOUNTER — Other Ambulatory Visit: Payer: Self-pay | Admitting: Vascular Surgery

## 2014-06-11 ENCOUNTER — Encounter: Payer: Self-pay | Admitting: Vascular Surgery

## 2014-06-11 ENCOUNTER — Ambulatory Visit (HOSPITAL_COMMUNITY)
Admission: RE | Admit: 2014-06-11 | Discharge: 2014-06-11 | Disposition: A | Discharge status: Home / Self Care | Payer: Medicare Other | Source: Ambulatory Visit | Attending: Vascular Surgery | Admitting: Vascular Surgery

## 2014-06-11 VITALS — BP 102/53 | HR 84 | Ht 69.0 in | Wt 344.0 lb

## 2014-06-11 DIAGNOSIS — R238 Other skin changes: Secondary | ICD-10-CM

## 2014-06-11 DIAGNOSIS — M7989 Other specified soft tissue disorders: Secondary | ICD-10-CM

## 2014-06-11 DIAGNOSIS — M79609 Pain in unspecified limb: Secondary | ICD-10-CM

## 2014-06-11 DIAGNOSIS — R609 Edema, unspecified: Secondary | ICD-10-CM | POA: Insufficient documentation

## 2014-06-11 DIAGNOSIS — L989 Disorder of the skin and subcutaneous tissue, unspecified: Secondary | ICD-10-CM | POA: Diagnosis not present

## 2014-06-11 DIAGNOSIS — M79604 Pain in right leg: Secondary | ICD-10-CM

## 2014-06-11 NOTE — Progress Notes (Signed)
Referred by:  Gayland Curry, DO [place], [place] [address]  Reason for referral: Swollen right leg  History of Present Illness  Lauren Barber is a 58 y.o. (Nov 17, 1956) female s/p CVA who presents with chief complaint: painful swollen right leg.  Patient bed bound due to sequalae from her prior CVA.  She denies any trauma or any obvious triggers.  Her swelling began few months ago reportedly.  The patient's symptoms include: swelling with distension discomfort.  The patient has had no history of DVT, no history of pregnancy, no history of varicose vein, no history of venous stasis ulcers, no history of  Lymphedema and no history of skin changes in lower legs.  There is no family history of venous disorders.  The patient has never used compression stockings in the past.  Additionally, this patient has a history of spontaneous drainage of purulence from left great toe.  The patient denies any intermittent claudication as she doesn't ambulate.  She also denies rest pain.  Past Medical History  Diagnosis Date  . Hypertension   . Obesities, morbid   . Stroke   . CVA (cerebral vascular accident)   . Vaginal bleeding   . Endometriosis   . Atopic conjunctivitis   . Diabetes mellitus without complication   . Cellulitis   . Insomnia   . Cardiomegaly   . Degenerative, intervertebral disc   . Depressive disorder   . Gout   . Anemia     Past Surgical History  Procedure Laterality Date  . Cesarean section    . Biopsy endometrial N/A 10 03 2013    History   Social History  . Marital Status: Single    Spouse Name: N/A    Number of Children: N/A  . Years of Education: N/A   Occupational History  . Not on file.   Social History Main Topics  . Smoking status: Former Smoker -- 0.50 packs/day    Types: Cigarettes    Quit date: 06/11/2012  . Smokeless tobacco: Not on file  . Alcohol Use: No  . Drug Use: No  . Sexual Activity: Not on file   Other Topics Concern  .  Not on file   Social History Narrative  . No narrative on file    Family History: patient unable to recall any medical problems in family.  Current Outpatient Prescriptions on File Prior to Visit  Medication Sig Dispense Refill  . allopurinol (ZYLOPRIM) 100 MG tablet Take 1 tablet (100 mg total) by mouth daily.  30 tablet  0  . buPROPion (WELLBUTRIN) 75 MG tablet Take 75 mg by mouth daily.       . colchicine 0.6 MG tablet Take 0.6 mg by mouth daily.      . cyclobenzaprine (FLEXERIL) 10 MG tablet Take 10 mg by mouth 3 (three) times daily as needed. spasms      . diphenhydrAMINE (BENADRYL) 25 MG tablet Take 25 mg by mouth every 8 (eight) hours as needed for itching. For itching      . EPINEPHrine (EPIPEN) 0.3 mg/0.3 mL DEVI Inject 0.3 mg into the muscle once.      Marland Kitchen FLUoxetine (PROZAC) 20 MG capsule Take 60 mg by mouth daily.      . furosemide (LASIX) 40 MG tablet Take 40 mg by mouth daily.      Marland Kitchen gabapentin (NEURONTIN) 300 MG capsule Take 900 mg by mouth 3 (three) times daily.       Marland Kitchen  insulin aspart (NOVOLOG) 100 UNIT/ML injection Inject 7 Units into the skin 3 (three) times daily before meals.       . lactulose (CHRONULAC) 10 GM/15ML solution Take 10 g by mouth 2 (two) times daily.      Marland Kitchen lisinopril (PRINIVIL,ZESTRIL) 5 MG tablet Take 5 mg by mouth daily. Hold for SBP <100      . megestrol (MEGACE) 40 MG tablet Take 40 mg by mouth daily.      . Multiple Vitamins-Minerals (MULTIVITAMIN WITH MINERALS) tablet Take 1 tablet by mouth daily.      . traMADol (ULTRAM) 50 MG tablet Take 4 tablets (200 mg total) by mouth every 12 (twelve) hours. For pain  240 tablet  5  . triamcinolone ointment (KENALOG) 0.1 % Apply 1 application topically daily as needed (to legs for dermatitis).       No current facility-administered medications on file prior to visit.    No Known Allergies   REVIEW OF SYSTEMS:  (Positives checked otherwise negative)  CARDIOVASCULAR:  []  chest pain, []  chest pressure, []   palpitations, []  shortness of breath when laying flat, []  shortness of breath with exertion,  []  pain in feet when walking, [x]  pain in feet when laying flat, []  history of blood clot in veins (DVT), []  history of phlebitis, [x]  swelling in legs, []  varicose veins  PULMONARY:  []  productive cough, []  asthma, []  wheezing  NEUROLOGIC:  [x]  weakness in arms or legs, []  numbness in arms or legs, []  difficulty speaking or slurred speech, []  temporary loss of vision in one eye, []  dizziness  HEMATOLOGIC:  []  bleeding problems, []  problems with blood clotting too easily  MUSCULOSKEL:  []  joint pain, []  joint swelling  GASTROINTEST:  []  vomiting blood, []  blood in stool     GENITOURINARY:  []  burning with urination, []  blood in urine  PSYCHIATRIC:  []  history of major depression  INTEGUMENTARY:  []  rashes, []  ulcers  CONSTITUTIONAL:  []  fever, []  chills   Physical Examination Filed Vitals:   06/11/14 0911  BP: 102/53  Pulse: 84  Height: 5\' 9"  (1.753 m)  Weight: 344 lb (156.037 kg)  SpO2: 100%   Body mass index is 50.78 kg/(m^2).  General: A&O x 3, WD, super obese  Head: [place]/AT  Ear/Nose/Throat: Hearing grossly intact, nares w/o erythema or drainage, oropharynx w/o Erythema/Exudate  Eyes: PERRLA, EOMI  Neck: Supple, no nuchal rigidity, no palpable LAD  Pulmonary: Sym exp, good air movt, CTAB, no rales, rhonchi, & wheezing  Cardiac: RRR, Nl S1, S2, no Murmurs, rubs or gallops  Vascular: Vessel Right Left  Radial Palpable Palpable  Brachial Palpable Palpable  Carotid Palpable, without bruit Palpable, without bruit  Aorta Not palpable due to super obesity N/A  Femoral Unable to palpate due to skin folds Unable to palpate due to skin folds  Popliteal Not palpable Not palpable  PT Not Palpable Not Palpable  DP Faintly Palpable Faintly Palpable   Gastrointestinal: soft, NTND, -G/R, - HSM, - masses, - CVAT B, large pannus  Musculoskeletal: M/S 5/5 in BUE (R>L), BLE 1-2/5,  Extremities without ischemic changes , R great toe nail removed, no erythema or drainage from L great toe, no ulcers or gangrene B, warm feet, no obvious LDS or varicosities, no asx swelling today, +Lipedema fat distribution  Neurologic: CN 2-12 intact , Pain and light touch intact in extremities , Motor exam as listed above  Psychiatric: Judgment intact, Mood & affect appropriate for pt's clinical situation  Dermatologic:  See M/S exam for extremity exam, no rashes otherwise noted  Lymph : No Cervical, Axillary, or Inguinal lymphadenopathy   Non-Invasive Vascular Imaging  RLE Venous Insufficiency Duplex (Date: 06/11/2014):   No DVT in CFV  Other veins cannot be observed due to her body habitus  Incidentally, tibial arteries all triphasic  Outside Studies/Documentation 3 pages of outside documents were reviewed including: outpatient nursing home chart.  Medical Decision Making  Lauren Barber is a 58 y.o. female who presents with: history of right leg swelling of unknown etiology, lipedema, history of draining abscess from left great toe, diabetes with multiple complications   Unable to get an accurate venous duplex due to pt's super obesity.  I doubt she is a candidate for ascending venography, as I doubt she could fit on the angiographic table.  Subsequently, if there is a clinical concern for DVT, I would obtain a D-dimer and if it was consistent with a DVT, proceed with anticoagulation.  I also doubt obtaining a venous insufficiency duplex would be useful, as I doubt the valves can be visualized in this patient.  Additionally, her body habitus makes use of compression stockings impossible.  In regards to concerns for possible PAD, there is no evidence of such in the R foot.  As I can palpate a good dorsalis pedis pulse, I doubt there is clinically significant disease in the left foot.  Thank you for allowing [place] to participate in this patient's care.  Adele Barthel,  MD [facility] Office: [phone] Pager: [phone]  06/11/2014, 9:31 AM

## 2014-06-15 ENCOUNTER — Encounter (HOSPITAL_COMMUNITY): Payer: Self-pay | Admitting: Pharmacist

## 2014-06-24 ENCOUNTER — Encounter (HOSPITAL_COMMUNITY): Payer: Self-pay | Admitting: *Deleted

## 2014-06-24 ENCOUNTER — Telehealth (HOSPITAL_COMMUNITY): Payer: Self-pay | Admitting: *Deleted

## 2014-06-24 NOTE — Telephone Encounter (Signed)
Nursing home called to cancel surgery on 06/28/14 after talking with pre-op, patient stated she was scared and did not want surgery

## 2014-06-25 ENCOUNTER — Encounter (HOSPITAL_COMMUNITY)
Admission: RE | Admit: 2014-06-25 | Discharge: 2014-06-25 | Disposition: A | Discharge status: Home / Self Care | Payer: Medicare Other | Source: Ambulatory Visit | Attending: Obstetrics & Gynecology | Admitting: Obstetrics & Gynecology

## 2014-06-25 ENCOUNTER — Encounter (HOSPITAL_COMMUNITY): Payer: Self-pay

## 2014-06-25 DIAGNOSIS — R9389 Abnormal findings on diagnostic imaging of other specified body structures: Secondary | ICD-10-CM | POA: Diagnosis not present

## 2014-06-25 DIAGNOSIS — N840 Polyp of corpus uteri: Secondary | ICD-10-CM | POA: Diagnosis not present

## 2014-06-25 DIAGNOSIS — E119 Type 2 diabetes mellitus without complications: Secondary | ICD-10-CM | POA: Diagnosis not present

## 2014-06-25 DIAGNOSIS — Z6841 Body Mass Index (BMI) 40.0 and over, adult: Secondary | ICD-10-CM | POA: Diagnosis not present

## 2014-06-25 DIAGNOSIS — F341 Dysthymic disorder: Secondary | ICD-10-CM | POA: Diagnosis not present

## 2014-06-25 DIAGNOSIS — G8190 Hemiplegia, unspecified affecting unspecified side: Secondary | ICD-10-CM | POA: Diagnosis not present

## 2014-06-25 DIAGNOSIS — G709 Myoneural disorder, unspecified: Secondary | ICD-10-CM | POA: Diagnosis not present

## 2014-06-25 DIAGNOSIS — M129 Arthropathy, unspecified: Secondary | ICD-10-CM | POA: Diagnosis not present

## 2014-06-25 DIAGNOSIS — Z87891 Personal history of nicotine dependence: Secondary | ICD-10-CM | POA: Diagnosis not present

## 2014-06-25 DIAGNOSIS — Z794 Long term (current) use of insulin: Secondary | ICD-10-CM | POA: Diagnosis not present

## 2014-06-25 DIAGNOSIS — D649 Anemia, unspecified: Secondary | ICD-10-CM | POA: Diagnosis not present

## 2014-06-25 DIAGNOSIS — I10 Essential (primary) hypertension: Secondary | ICD-10-CM | POA: Diagnosis not present

## 2014-06-25 DIAGNOSIS — N950 Postmenopausal bleeding: Secondary | ICD-10-CM | POA: Diagnosis not present

## 2014-06-25 DIAGNOSIS — M1990 Unspecified osteoarthritis, unspecified site: Secondary | ICD-10-CM | POA: Diagnosis not present

## 2014-06-25 HISTORY — DX: Personal history of other medical treatment: Z92.89

## 2014-06-25 HISTORY — DX: Myoneural disorder, unspecified: G70.9

## 2014-06-25 HISTORY — DX: Headache: R51

## 2014-06-25 HISTORY — DX: Anxiety disorder, unspecified: F41.9

## 2014-06-25 LAB — CBC
HCT: 31.3 % — ABNORMAL LOW (ref 36.0–46.0)
HEMOGLOBIN: 9.7 g/dL — AB (ref 12.0–15.0)
MCH: 30.8 pg (ref 26.0–34.0)
MCHC: 31 g/dL (ref 30.0–36.0)
MCV: 99.4 fL (ref 78.0–100.0)
Platelets: 288 10*3/uL (ref 150–400)
RBC: 3.15 MIL/uL — AB (ref 3.87–5.11)
RDW: 14.9 % (ref 11.5–15.5)
WBC: 7.5 10*3/uL (ref 4.0–10.5)

## 2014-06-25 LAB — BASIC METABOLIC PANEL
Anion gap: 14 (ref 5–15)
BUN: 25 mg/dL — ABNORMAL HIGH (ref 6–23)
CALCIUM: 9.4 mg/dL (ref 8.4–10.5)
CO2: 24 meq/L (ref 19–32)
CREATININE: 1.82 mg/dL — AB (ref 0.50–1.10)
Chloride: 101 mEq/L (ref 96–112)
GFR calc Af Amer: 35 mL/min — ABNORMAL LOW (ref >90)
GFR calc non Af Amer: 30 mL/min — ABNORMAL LOW (ref >90)
Glucose, Bld: 140 mg/dL — ABNORMAL HIGH (ref 70–99)
Potassium: 4.3 mEq/L (ref 3.7–5.3)
Sodium: 139 mEq/L (ref 137–147)

## 2014-06-25 LAB — TYPE AND SCREEN
ABO/RH(D): O POS
Antibody Screen: NEGATIVE

## 2014-06-25 LAB — ABO/RH: ABO/RH(D): O POS

## 2014-06-25 NOTE — Anesthesia Preprocedure Evaluation (Addendum)
Anesthesia Evaluation  Patient identified by MRN, date of birth, ID band Patient awake    Reviewed: Allergy & Precautions, H&P , NPO status , Patient's Chart, lab work & pertinent test results  Airway Mallampati: III TM Distance: >3 FB Neck ROM: full    Dental no notable dental hx. (+) Teeth Intact   Pulmonary former smoker,    Pulmonary exam normal       Cardiovascular Exercise Tolerance: Good hypertension, Pt. 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  Neuromuscular disease CVA, Residual Symptoms    GI/Hepatic negative GI ROS, Neg liver ROS,   Endo/Other  diabetes, Type 2, Insulin DependentMorbid obesity  Renal/GU negative Renal ROS  negative genitourinary   Musculoskeletal  (+) Arthritis -, Osteoarthritis,    Abdominal (+) + obese,   Peds  Hematology  (+) anemia ,   Anesthesia Other Findings   Reproductive/Obstetrics negative OB ROS                          Anesthesia Physical Anesthesia Plan  ASA: III  Anesthesia Plan: MAC   Post-op Pain Management:    Induction: Intravenous  Airway Management Planned:   Additional Equipment:   Intra-op Plan:   Post-operative Plan: Extubation in OR  Informed Consent: I have reviewed the patients History and Physical, chart, labs and discussed the procedure including the risks, benefits and alternatives for the proposed anesthesia with the patient or authorized representative who has indicated his/her understanding and acceptance.   Dental advisory given  Plan Discussed with: CRNA, Anesthesiologist and Surgeon  Anesthesia Plan Comments:        Anesthesia Quick Evaluation

## 2014-06-25 NOTE — Pre-Procedure Instructions (Signed)
Patient is w/c bound.  [facility] uses [place] lift to get patient out of bed into w/c and shower chair.  Patient to used CHG cloths on day of surgery instead of surgical soap.  Dr Jillyn Hidden saw this patient at PAT appt.

## 2014-06-25 NOTE — Patient Instructions (Addendum)
   Your procedure is scheduled on:  Monday, August 10  Enter through the [facility] at: [place] up the phone at the desk and dial [phone] and inform us of your arrival.  Please call this number if you have any problems the morning of surgery: [phone]  Remember: Do not eat or drink after midnight: Sunday Take these medicines the morning of surgery with a SIP OF WATER:  Eliquis, flexerill, lasix, lisinopril, neurotin, prozac, tramadol, Wellbutrin.  No diabetes medication on Monday, day of surgery.  We will check your blood sugar upon arrival to Short Stay Department.  Do not wear jewelry, make-up, or FINGER nail polish No metal in your hair or on your body. Do not wear lotions, powders, perfumes.  You may wear deodorant.  Do not bring valuables to the hospital. Contacts, dentures or bridgework may not be worn into surgery.  Patients discharged on the day of surgery will not be allowed to drive home.  [place]  [phone] back to [facility]

## 2014-06-28 ENCOUNTER — Ambulatory Visit (HOSPITAL_COMMUNITY)
Admission: RE | Admit: 2014-06-28 | Discharge: 2014-06-28 | Disposition: A | Discharge status: Home / Self Care | Payer: Medicare Other | Source: Ambulatory Visit | Attending: Obstetrics & Gynecology | Admitting: Obstetrics & Gynecology

## 2014-06-28 ENCOUNTER — Encounter (HOSPITAL_COMMUNITY): Payer: Self-pay | Admitting: Obstetrics & Gynecology

## 2014-06-28 ENCOUNTER — Encounter (HOSPITAL_COMMUNITY): Payer: Medicare Other | Admitting: Anesthesiology

## 2014-06-28 ENCOUNTER — Encounter (HOSPITAL_COMMUNITY)
Admission: RE | Disposition: A | Discharge status: Home / Self Care | Payer: Self-pay | Source: Ambulatory Visit | Attending: Obstetrics & Gynecology

## 2014-06-28 ENCOUNTER — Ambulatory Visit (HOSPITAL_COMMUNITY): Payer: Medicare Other | Admitting: Anesthesiology

## 2014-06-28 DIAGNOSIS — I10 Essential (primary) hypertension: Secondary | ICD-10-CM | POA: Insufficient documentation

## 2014-06-28 DIAGNOSIS — N840 Polyp of corpus uteri: Secondary | ICD-10-CM | POA: Diagnosis not present

## 2014-06-28 DIAGNOSIS — Z87891 Personal history of nicotine dependence: Secondary | ICD-10-CM | POA: Insufficient documentation

## 2014-06-28 DIAGNOSIS — R9389 Abnormal findings on diagnostic imaging of other specified body structures: Secondary | ICD-10-CM | POA: Insufficient documentation

## 2014-06-28 DIAGNOSIS — G709 Myoneural disorder, unspecified: Secondary | ICD-10-CM | POA: Insufficient documentation

## 2014-06-28 DIAGNOSIS — N950 Postmenopausal bleeding: Secondary | ICD-10-CM | POA: Insufficient documentation

## 2014-06-28 DIAGNOSIS — Z794 Long term (current) use of insulin: Secondary | ICD-10-CM | POA: Insufficient documentation

## 2014-06-28 DIAGNOSIS — F341 Dysthymic disorder: Secondary | ICD-10-CM | POA: Insufficient documentation

## 2014-06-28 DIAGNOSIS — D649 Anemia, unspecified: Secondary | ICD-10-CM | POA: Diagnosis not present

## 2014-06-28 DIAGNOSIS — Z6841 Body Mass Index (BMI) 40.0 and over, adult: Secondary | ICD-10-CM | POA: Insufficient documentation

## 2014-06-28 DIAGNOSIS — M129 Arthropathy, unspecified: Secondary | ICD-10-CM | POA: Insufficient documentation

## 2014-06-28 DIAGNOSIS — G8190 Hemiplegia, unspecified affecting unspecified side: Secondary | ICD-10-CM | POA: Insufficient documentation

## 2014-06-28 DIAGNOSIS — M1990 Unspecified osteoarthritis, unspecified site: Secondary | ICD-10-CM | POA: Insufficient documentation

## 2014-06-28 DIAGNOSIS — E119 Type 2 diabetes mellitus without complications: Secondary | ICD-10-CM | POA: Insufficient documentation

## 2014-06-28 HISTORY — PX: HYSTEROSCOPY W/D&C: SHX1775

## 2014-06-28 LAB — GLUCOSE, CAPILLARY
Glucose-Capillary: 104 mg/dL — ABNORMAL HIGH (ref 70–99)
Glucose-Capillary: 92 mg/dL (ref 70–99)

## 2014-06-28 SURGERY — DILATATION AND CURETTAGE /HYSTEROSCOPY
Anesthesia: Monitor Anesthesia Care | Site: Uterus

## 2014-06-28 MED ORDER — FENTANYL CITRATE 0.05 MG/ML IJ SOLN
INTRAMUSCULAR | Status: AC
  Filled 2014-06-28: qty 2

## 2014-06-28 MED ORDER — MEGESTROL ACETATE 20 MG PO TABS: 40.0000 mg | ORAL_TABLET | Freq: Every day | ORAL | Status: DC

## 2014-06-28 MED ORDER — ONDANSETRON HCL 4 MG/2ML IJ SOLN
INTRAMUSCULAR | Status: AC
  Filled 2014-06-28: qty 2

## 2014-06-28 MED ORDER — SCOPOLAMINE 1 MG/3DAYS TD PT72
1.0000 | MEDICATED_PATCH | Freq: Once | TRANSDERMAL | Status: DC
  Administered 2014-06-28: 1.5 mg via TRANSDERMAL

## 2014-06-28 MED ORDER — MIDAZOLAM HCL 5 MG/5ML IJ SOLN
INTRAMUSCULAR | Status: DC | PRN
  Administered 2014-06-28: 2 mg via INTRAVENOUS

## 2014-06-28 MED ORDER — LACTATED RINGERS IV SOLN
INTRAVENOUS | Status: DC
  Administered 2014-06-28: 10:00:00 via INTRAVENOUS

## 2014-06-28 MED ORDER — LIDOCAINE HCL (CARDIAC) 20 MG/ML IV SOLN
INTRAVENOUS | Status: AC
  Filled 2014-06-28: qty 5

## 2014-06-28 MED ORDER — BUPIVACAINE HCL (PF) 0.5 % IJ SOLN
INTRAMUSCULAR | Status: DC | PRN
Start: 2014-06-28 — End: 2014-06-28
  Administered 2014-06-28: 20 mL

## 2014-06-28 MED ORDER — LIDOCAINE HCL (CARDIAC) 20 MG/ML IV SOLN
INTRAVENOUS | Status: DC | PRN
  Administered 2014-06-28: 100 mg via INTRAVENOUS

## 2014-06-28 MED ORDER — PROPOFOL 10 MG/ML IV EMUL
INTRAVENOUS | Status: AC
  Filled 2014-06-28: qty 20

## 2014-06-28 MED ORDER — DEXAMETHASONE SODIUM PHOSPHATE 10 MG/ML IJ SOLN
INTRAMUSCULAR | Status: DC | PRN
  Administered 2014-06-28: 4 mg via INTRAVENOUS

## 2014-06-28 MED ORDER — SCOPOLAMINE 1 MG/3DAYS TD PT72
MEDICATED_PATCH | TRANSDERMAL | Status: AC
  Administered 2014-06-28: 1.5 mg via TRANSDERMAL
  Filled 2014-06-28: qty 1

## 2014-06-28 MED ORDER — DEXAMETHASONE SODIUM PHOSPHATE 10 MG/ML IJ SOLN
INTRAMUSCULAR | Status: AC
  Filled 2014-06-28: qty 1

## 2014-06-28 MED ORDER — GLYCINE 1.5 % IR SOLN
Status: DC | PRN
  Administered 2014-06-28: 3000 mL

## 2014-06-28 MED ORDER — DEXTROSE-NACL 5-0.45 % IV SOLN: INTRAVENOUS | Status: DC

## 2014-06-28 MED ORDER — FENTANYL CITRATE 0.05 MG/ML IJ SOLN: 25.0000 ug | INTRAMUSCULAR | Status: DC | PRN

## 2014-06-28 MED ORDER — FENTANYL CITRATE 0.05 MG/ML IJ SOLN
INTRAMUSCULAR | Status: DC | PRN
  Administered 2014-06-28 (×2): 50 ug via INTRAVENOUS

## 2014-06-28 MED ORDER — MIDAZOLAM HCL 2 MG/2ML IJ SOLN
INTRAMUSCULAR | Status: AC
  Filled 2014-06-28: qty 2

## 2014-06-28 MED ORDER — PROPOFOL 10 MG/ML IV BOLUS
INTRAVENOUS | Status: DC | PRN
  Administered 2014-06-28: 200 mg via INTRAVENOUS
  Administered 2014-06-28: 50 mg via INTRAVENOUS

## 2014-06-28 MED ORDER — BUPIVACAINE HCL (PF) 0.5 % IJ SOLN
INTRAMUSCULAR | Status: AC
  Filled 2014-06-28: qty 30

## 2014-06-28 SURGICAL SUPPLY — 15 items
CATH ROBINSON RED A/P 16FR (CATHETERS) ×3 IMPLANT
CLOTH BEACON ORANGE TIMEOUT ST (SAFETY) ×3 IMPLANT
CONTAINER PREFILL 10% NBF 60ML (FORM) ×3 IMPLANT
DRAPE HYSTEROSCOPY (DRAPE) ×3 IMPLANT
GLOVE BIO SURGEON STRL SZ7 (GLOVE) ×6 IMPLANT
GLOVE BIOGEL PI IND STRL 7.0 (GLOVE) ×5 IMPLANT
GLOVE BIOGEL PI INDICATOR 7.0 (GLOVE) ×10
GLOVE SURG SS PI 7.0 STRL IVOR (GLOVE) ×18 IMPLANT
GOWN STRL REUS W/TWL LRG LVL3 (GOWN DISPOSABLE) ×12 IMPLANT
PACK VAGINAL MINOR WOMEN LF (CUSTOM PROCEDURE TRAY) ×3 IMPLANT
PAD OB MATERNITY 4.3X12.25 (PERSONAL CARE ITEMS) ×3 IMPLANT
SET TUBING HYSTEROSCOPY 2 NDL (TUBING) ×3 IMPLANT
TOWEL OR 17X24 6PK STRL BLUE (TOWEL DISPOSABLE) ×6 IMPLANT
TUBE HYSTEROSCOPY W Y-CONNECT (TUBING) ×3 IMPLANT
WATER STERILE IRR 1000ML POUR (IV SOLUTION) ×3 IMPLANT

## 2014-06-28 NOTE — Transfer of Care (Signed)
Immediate Anesthesia Transfer of Care Note  Patient: Lauren Barber  Procedure(s) Performed: Procedure(s): DILATATION AND CURETTAGE /HYSTEROSCOPY (N/A)  Patient Location: PACU  Anesthesia Type:General  Level of Consciousness: awake  Airway & Oxygen Therapy: Patient Spontanous Breathing and Patient connected to nasal cannula oxygen  Post-op Assessment: Report given to PACU RN and Post -op Vital signs reviewed and stable  Post vital signs: stable  Complications: No apparent anesthesia complications

## 2014-06-28 NOTE — Discharge Instructions (Signed)
Hysteroscopy, Care After °Refer to this sheet in the next few weeks. These instructions provide you with information on caring for yourself after your procedure. Your health care provider may also give you more specific instructions. Your treatment has been planned according to current medical practices, but problems sometimes occur. Call your health care provider if you have any problems or questions after your procedure.  °WHAT TO EXPECT AFTER THE PROCEDURE °After your procedure, it is typical to have the following: °· You may have some cramping. This normally lasts for a couple days. °· You may have bleeding. This can vary from light spotting for a few days to menstrual-like bleeding for 3-7 days. °HOME CARE INSTRUCTIONS °· Rest for the first 1-2 days after the procedure. °· Only take over-the-counter or prescription medicines as directed by your health care provider. Do not take aspirin. It can increase the chances of bleeding. °· Take showers instead of baths for 2 weeks or as directed by your health care provider. °· Do not drive for 24 hours or as directed. °· Do not drink alcohol while taking pain medicine. °· Do not use tampons, douche, or have sexual intercourse for 2 weeks or until your health care provider says it is okay. °· Take your temperature twice a day for 4-5 days. Write it down each time. °· Follow your health care provider's advice about diet, exercise, and lifting. °· If you develop constipation, you may: °¨ Take a mild laxative if your health care provider approves. °¨ Add bran foods to your diet. °¨ Drink enough fluids to keep your urine clear or pale yellow. °· Try to have someone with you or available to you for the first 24-48 hours, especially if you were given a general anesthetic. °· Follow up with your health care provider as directed. °SEEK MEDICAL CARE IF: °· You feel dizzy or lightheaded. °· You feel sick to your stomach (nauseous). °· You have abnormal vaginal discharge. °· You  have a rash. °· You have pain that is not controlled with medicine. °SEEK IMMEDIATE MEDICAL CARE IF: °· You have bleeding that is heavier than a normal menstrual period. °· You have a fever. °· You have increasing cramps or pain, not controlled with medicine. °· You have new belly (abdominal) pain. °· You pass out. °· You have pain in the tops of your shoulders (shoulder strap areas). °· You have shortness of breath. °Document Released: 08/26/2013 Document Reviewed: 08/26/2013 °Exit[employer]® Patient Information ©2015 [employer]. This information is not intended to replace advice given to you by your health care provider. Make sure you discuss any questions you have with your health care provider. ° °

## 2014-06-28 NOTE — H&P (Signed)
  Subjective:    Patient is a 58 y.o. female scheduled for hysteroscopy with dilation and curretage. Indications for procedure are thickened endometrium and h/o post menopausal bleeding.  Pt reports that the bleeding has stopped since starting the Megace 1 year ago.   She is morbidly obese and has multiple medical problems which made her exam impossible in the office.   A recent sonogram reveals that there is persistent thickened endometrium which requires endometrial sampling.  Discussed Blood/Blood Products: not applicable  Menstrual History: OB History   Grav Para Term Preterm Abortions TAB SAB Ect Mult Living   2 2 2  0 0 0 0 0 0 2       No LMP recorded. Patient is postmenopausal.    The following portions of the patient's history were reviewed and updated as appropriate: allergies, current medications, past family history, past medical history, past social history, past surgical history and problem list.  Review of Systems Pertinent items are noted in HPI.    Objective:  BP 123/67  Pulse 88  Temp(Src) 98.4 F (36.9 C) (Oral)  Resp 22  SpO2 97%   General:   alert and no distress  Skin:   normal                 Pelvis:  to be completed under anesthesia      05/26/2014 CLINICAL DATA: Dysfunctional uterine bleeding.  EXAM:  TRANSABDOMINAL ULTRASOUND OF PELVIS  TECHNIQUE:  Transabdominal ultrasound examination of the pelvis was performed  including evaluation of the uterus, ovaries, adnexal regions, and  pelvic cul-de-sac.  COMPARISON: None.  FINDINGS:  Uterus  Measurements: 8.9 x 5.4 x 5.6 cm. No fibroids or other mass  visualized.  Endometrium  Thickness: 11.8 mm. No focal abnormality visualized.  Right ovary  Measurements: Not visualized. No adnexal mass seen.  Left ovary  Measurements: Not visualized. No adnexal mass seen.  Other findings: Exam detail is diminished due to in mobility of the  patient in performance of the exam with patient on stretcher.   IMPRESSION:  1. Exam detail diminished due to mobility of the patient.  2. No findings to explain dysfunctional uterine bleeding. If  bleeding remains unresponsive to hormonal or medical therapy,  sonohysterogram should be considered for focal lesion work-up. (Ref:  Radiological Reasoning: Algorithmic Workup of Abnormal Vaginal  Bleeding with Endovaginal Sonography and Sonohysterography. AJR  2008; 194:R74-08)   Assessment:   Thickened endometrium- h/o post menopausal bleeding    Plan:     Patient desires surgical management with hysteroscopy with dilation and curretage.  The risks of surgery were discussed in detail with the patient including but not limited to: bleeding which may require transfusion or reoperation; infection which may require prolonged hospitalization or re-hospitalization and antibiotic therapy; injury to bowel, bladder, ureters and major vessels or other surrounding organs; need for additional procedures including laparotomy; thromboembolic phenomenon, incisional problems and other postoperative or anesthesia complications.  Patient was told that the likelihood that her condition and symptoms will be treated effectively with this surgical management was very high; the postoperative expectations were also discussed in detail. The patient also understands the alternative treatment options which were discussed in full. All questions were answered.

## 2014-06-28 NOTE — Progress Notes (Signed)
Ambulance service called for transport.  Called report to [facility].  Spoke with Matilde Bash who will relay message to patient's nurse.

## 2014-06-28 NOTE — Op Note (Signed)
PATIENT:  Lauren Barber  58 y.o. female  PRE-OPERATIVE DIAGNOSIS:  dysfunctional uterine bleeding   POST-OPERATIVE DIAGNOSIS:  dysfunctional uterine bleeding   PROCEDURE:  Procedure(s): DILATATION AND CURETTAGE /HYSTEROSCOPY (N/A)  SURGEON:  Surgeon(s) and Role:    * Lavonia Drafts, MD - Primary  ANESTHESIA:   general  EBL:   <5cc  BLOOD ADMINISTERED:none  DRAINS: none   LOCAL MEDICATIONS USED:  MARCAINE     SPECIMEN:  Source of Specimen:  endometrial curettings; endometrial polyp  DISPOSITION OF SPECIMEN:  PATHOLOGY  COUNTS:  YES  TOURNIQUET:  * No tourniquets in log *  DICTATION: .Note written in EPIC  PLAN OF CARE: Discharge to home after PACU  PATIENT DISPOSITION:  PACU - hemodynamically stable.   INDICATIONS: 58 y.o. [id number]  here for scheduled surgery for thickened endometrium with a h/o post menopausal bleeding.   Risks of surgery were discussed with the patient including but not limited to: bleeding which may require transfusion; infection which may require antibiotics; injury to uterus or surrounding organs; need for additional procedures including laparotomy or laparoscopy; and other postoperative/anesthesia complications. Written informed consent was obtained.    FINDINGS:   Diffuse proliferative endometrium.  Normal ostia bilaterally.  Delay start of Pharmacological VTE agent (>24hrs) due to surgical blood loss or risk of bleeding: not applicable PROCEDURE DETAILS:  The patient was taken to the operating room where general anesthesia was administered with LMA and was found to be adequate.  After an adequate timeout was performed, she was placed in the dorsal lithotomy position and examined; then prepped and draped in the sterile manner.   Her bladder was catheterized for an unmeasured amount of clear, yellow urine. A speculum was then placed in the patient's vagina and a single tooth tenaculum was applied to the anterior lip of the cervix.   The cervix  was sounded to 11cm and dilated manually with metal dilators to accommodate the 5 mm diagnostic hysteroscope.  Once the cervix was dilated, the hysteroscope was inserted under direct visualization using 1.5% glycine as a suspension medium.  The uterine cavity was carefully examined, both ostia were recognized, and diffusely proliferative endometrium was noted.   After further careful visualization of the uterine cavity, the hysteroscope was removed under direct visualization.  A sharp curettage was then performed to obtain a moderate amount of endometrial curettings.  The hysteroscope was reinserted and the cavity was found to be intact with no lesions or masses appreciated. The tenaculum was removed from the anterior lip of the cervix and the vaginal speculum was removed after noting good hemostasis.  The patient tolerated the procedure well and was taken to the recovery area awake, extubated and in stable condition.  The patient will be discharged to home as per PACU criteria.  Routine postoperative instructions given.  She was prescribed Ibuprofen and Colace.  She will follow up in the clinic in 6 weeks for postoperative evaluation.

## 2014-06-28 NOTE — Brief Op Note (Signed)
06/28/2014  11:31 AM  PATIENT:  Lauren Barber  58 y.o. female  PRE-OPERATIVE DIAGNOSIS:  dysfunctional uterine bleeding   POST-OPERATIVE DIAGNOSIS:  dysfunctional uterine bleeding   PROCEDURE:  Procedure(s): DILATATION AND CURETTAGE /HYSTEROSCOPY (N/A)  SURGEON:  Surgeon(s) and Role:    * Lavonia Drafts, MD - Primary  ANESTHESIA:   general  EBL:   <5cc  BLOOD ADMINISTERED:none  DRAINS: none   LOCAL MEDICATIONS USED:  MARCAINE     SPECIMEN:  Source of Specimen:  endometrial curretings  DISPOSITION OF SPECIMEN:  PATHOLOGY  COUNTS:  YES  TOURNIQUET:  * No tourniquets in log *  DICTATION: .Note written in EPIC  PLAN OF CARE: Discharge to home after PACU  PATIENT DISPOSITION:  PACU - hemodynamically stable.   Delay start of Pharmacological VTE agent (>24hrs) due to surgical blood loss or risk of bleeding: not applicable

## 2014-06-29 ENCOUNTER — Encounter (HOSPITAL_COMMUNITY): Payer: Self-pay | Admitting: Obstetrics & Gynecology

## 2014-06-29 NOTE — Anesthesia Postprocedure Evaluation (Signed)
  Anesthesia Post-op Note  Patient: Lauren Barber  Procedure(s) Performed: Procedure(s): DILATATION AND CURETTAGE /HYSTEROSCOPY (N/A)  Patient Location: PACU  Anesthesia Type:General  Level of Consciousness: awake, alert  and oriented  Airway and Oxygen Therapy: Patient Spontanous Breathing  Post-op Pain: none  Post-op Assessment: Post-op Vital signs reviewed, Patient's Cardiovascular Status Stable, Respiratory Function Stable, Patent Airway, No signs of Nausea or vomiting and Pain level controlled  Post-op Vital Signs: Reviewed and stable  Last Vitals:  Filed Vitals:   06/28/14 1230  BP:   Pulse: 87  Temp: 36.7 C  Resp: 18    Complications: No apparent anesthesia complications

## 2014-06-30 ENCOUNTER — Telehealth: Payer: Self-pay

## 2014-06-30 NOTE — Telephone Encounter (Signed)
Attempted to contact patient-- heard "the person you are trying to reach is not accepting calls at this time." Will attempt again later.

## 2014-06-30 NOTE — Telephone Encounter (Signed)
Message copied by Geanie Logan on Wed Jun 30, 2014  8:05 AM ------      Message from: Lavonia Drafts      Created: Tue Jun 29, 2014  5:51 PM       Please call pt. Her biopsy yesterday was negative for uterine cancer but, there was a polyp that was removed.  She should keep the Megace for 3 months then she can stop it.  She can f/u in 1 year, or, sooner if she has problems.            Thx,      clh-S ------

## 2014-07-02 ENCOUNTER — Encounter: Payer: Self-pay | Admitting: General Practice

## 2014-07-02 NOTE — Telephone Encounter (Signed)
Attempted to call patient and heard message that the person you are trying to reach is not accepting calls at this time. Will send letter.

## 2014-07-09 ENCOUNTER — Non-Acute Institutional Stay (SKILLED_NURSING_FACILITY): Payer: Medicare Other | Admitting: Internal Medicine

## 2014-07-09 ENCOUNTER — Encounter: Payer: Self-pay | Admitting: Internal Medicine

## 2014-07-09 DIAGNOSIS — L02619 Cutaneous abscess of unspecified foot: Secondary | ICD-10-CM

## 2014-07-09 DIAGNOSIS — E1149 Type 2 diabetes mellitus with other diabetic neurological complication: Secondary | ICD-10-CM

## 2014-07-09 DIAGNOSIS — F3289 Other specified depressive episodes: Secondary | ICD-10-CM

## 2014-07-09 DIAGNOSIS — M62838 Other muscle spasm: Secondary | ICD-10-CM

## 2014-07-09 DIAGNOSIS — L03039 Cellulitis of unspecified toe: Secondary | ICD-10-CM

## 2014-07-09 DIAGNOSIS — I82409 Acute embolism and thrombosis of unspecified deep veins of unspecified lower extremity: Secondary | ICD-10-CM

## 2014-07-09 DIAGNOSIS — L03032 Cellulitis of left toe: Secondary | ICD-10-CM

## 2014-07-09 DIAGNOSIS — M109 Gout, unspecified: Secondary | ICD-10-CM

## 2014-07-09 DIAGNOSIS — F32A Depression, unspecified: Secondary | ICD-10-CM

## 2014-07-09 DIAGNOSIS — F329 Major depressive disorder, single episode, unspecified: Secondary | ICD-10-CM

## 2014-07-09 NOTE — Progress Notes (Signed)
Patient ID: Lauren Barber, female   DOB: 09/20/1956, 58 y.o.   MRN: [id number]    Facility: [facility]  Chief Complaint  Patient presents with  . Medical Management of Chronic Issues    routine visit, toe concerns   No Known Allergies  HPI 58 y/o morbidly obese female patient is a long term care resident of the facility and seen today for routine visit. She complaints of discomfort in left great toe. Staff have noticed drainage from her left toe. She denies any fever or chills. She has had recurrent toe nial infection, cellulitis, asymmetric swelling of her legs and is on anticoagulation.  No falls reported. Denies trauma Does not get out of bed. Not complaint with her diet No recent gout attack  Review of Systems  Constitutional: Negative for fever, chills, malaise/fatigue and diaphoresis.  HENT: Negative for congestion, hearing loss and sore throat.   Eyes: Negative for blurred vision, double vision and discharge.  Respiratory: Negative for cough, sputum production, shortness of breath and wheezing.   Cardiovascular: Negative for chest pain, palpitations Gastrointestinal: Negative for heartburn, nausea, vomiting, abdominal pain Genitourinary: Negative for dysuria, urgency, frequency and flank pain.  Skin: Negative for itching and rash.  Neurological: Negative for dizziness, tingling, focal weakness and headaches.  Psychiatric/Behavioral: Negative for depression and memory loss. The patient is not nervous/anxious.    Past Medical History  Diagnosis Date  . Hypertension   . Obesities, morbid   . Vaginal bleeding   . Endometriosis   . Atopic conjunctivitis   . Diabetes mellitus without complication   . Cellulitis     legs  . Insomnia   . Cardiomegaly   . Gout     feet  . Anemia   . Stroke 2013  . CVA (cerebral vascular accident)   . Anxiety   . Headache(784.0)     otc meds prn  . Degenerative, intervertebral disc     back, legs  . History of  blood transfusion [place] - unsure number of units transfused  . Neuromuscular disorder     diabetic neuropathy - feet  . Depressive disorder     depressive disorder   Current Outpatient Prescriptions on File Prior to Visit  Medication Sig Dispense Refill  . allopurinol (ZYLOPRIM) 100 MG tablet Take 200 mg by mouth daily.      Marland Kitchen apixaban (ELIQUIS) 5 MG TABS tablet Take 5 mg by mouth 2 (two) times daily. For dvt      . bacitracin-polymyxin b (POLYSPORIN) ointment Apply 1 application topically daily. Apply to left great toe topically every day shift for paronychia      . buPROPion (WELLBUTRIN) 75 MG tablet Take 75 mg by mouth daily.       . cyclobenzaprine (FLEXERIL) 10 MG tablet Take 10 mg by mouth 3 (three) times daily as needed. spasms      . diphenhydrAMINE (BENADRYL) 25 MG tablet Take 25 mg by mouth every 8 (eight) hours as needed for itching. For itching      . EPINEPHrine (EPIPEN) 0.3 mg/0.3 mL DEVI Inject 0.3 mg into the muscle once.      Marland Kitchen FLUoxetine (PROZAC) 20 MG capsule Take 60 mg by mouth daily.      . furosemide (LASIX) 40 MG tablet Take 40 mg by mouth daily.      Marland Kitchen gabapentin (NEURONTIN) 300 MG capsule Take 600 mg by mouth 3 (three) times daily. For diabetic neuropathy      .  insulin aspart (NOVOLOG) 100 UNIT/ML injection Inject 5 Units into the skin 3 (three) times daily before meals.       . lactulose (CHRONULAC) 10 GM/15ML solution Take 10 g by mouth 2 (two) times daily. constipation      . lisinopril (PRINIVIL,ZESTRIL) 5 MG tablet Take 5 mg by mouth daily. Hold for SBP <100      . megestrol (MEGACE) 20 MG tablet Take 2 tablets (40 mg total) by mouth daily.  30 tablet  3  . Multiple Vitamins-Minerals (MULTIVITAMIN WITH MINERALS) tablet Take 1 tablet by mouth daily.      Marland Kitchen tolnaftate (TINACTIN) 1 % cream Apply 1 application topically as needed. Apply to pose thigh/gluteal folds topically every shift for protection. Apply antifungal cream and then cover with zinc paste  to post thighs and gluteal folds every shift after incont. care      . triamcinolone ointment (KENALOG) 0.1 % Apply 1 application topically daily as needed (to legs for dermatitis).       No current facility-administered medications on file prior to visit.    Physical exam BP 139/76  Pulse 71  Temp(Src) 98.6 F (37 C)  Resp 18  Ht 5\' 9"  (1.753 m)  Wt 344 lb (156.037 kg)  BMI 50.78 kg/m2  SpO2 98%  Constitutional: She is oriented to person, place, and time. Morbidly obese   Neck: Neck supple. No JVD present. No thyromegaly present.   Cardiovascular: Normal rate, regular rhythm and intact distal pulses.    Respiratory: Effort normal and breath sounds normal. No respiratory distress. She has no wheezes.   GI: Soft. Bowel sounds are normal. She exhibits no distension. There is no tenderness.  Musculoskeletal: normal temperature in both legs, left great toe nail bed has erythema and is tender to touch. No drainage. Good distal pulses Neurological: She is alert and oriented to person, place, and time.   Skin: Skin is warm and dry Psychiatric: She has a normal mood and affect.   LABS REVIEWED:   06-29-13: uric acid 7.3 07-06-13: wbc 6.4; hgb 9.5; hct 29.5; mcv 91.3; plt 346; glucose 104; bun 24; creat 1.42; k+4.2; na++136; liver normal albumin 3.8; chol 158; ldl 96; trig 174; hgb a1c 6.5   07-07-13: micro-albumin 1.53   11-04-13 a1c 5.4 04/09/14 wbc 7.2, hb 9.3, hct 27.9, plt 295, na 139, k 4.1, glu 97, bun 24, cr 1.76, ca 9.1, uric acid 7.6, a1c 5.6  Assessment/plan  Left toe cellulitis With her history of DM and recent nail bed infection, will treat her empirically with keflex 500 mg qid for 3 days. She has been having recurrent infection of her toe nail. No signs of fungal infection. Will have her wear front toe covered slipper/ sandal during transfer to bathroom/ out of bed with her neuropathy  Gout no recent attack. Check uric acid level. Continue allopurinol 200 mg  daily  DVT Continue eliquis 5 mg bid for total of 6 months until December 2015. Check cbc  Muscle spasm Present but better. Will decrease flexeril to 10 mg bid prn for now  DM with neurological complications cbg controlled 104-234. Recheck a1c. D/c novolog. Change cbg check to once a day. Continue gabapentin and lisinopril. Check a1c  Depression Mood has been stable, continue prozac and welbutrin, monitor clinically

## 2014-07-12 LAB — HEMOGLOBIN A1C: HEMOGLOBIN A1C: 6.4 % — AB (ref 4.0–6.0)

## 2014-07-12 LAB — CBC AND DIFFERENTIAL
HCT: 30 % — AB (ref 36–46)
Hemoglobin: 9.8 g/dL — AB (ref 12.0–16.0)
Platelets: 285 10*3/uL (ref 150–399)
WBC: 6 10^3/mL

## 2014-07-20 ENCOUNTER — Other Ambulatory Visit: Payer: Self-pay | Admitting: *Deleted

## 2014-07-20 MED ORDER — TRAMADOL HCL 50 MG PO TABS: 200.0000 mg | ORAL_TABLET | Freq: Two times a day (BID) | ORAL | Status: DC

## 2014-07-20 NOTE — Telephone Encounter (Signed)
[facility] 

## 2014-07-23 ENCOUNTER — Non-Acute Institutional Stay (SKILLED_NURSING_FACILITY): Payer: Medicare Other | Admitting: Internal Medicine

## 2014-07-23 DIAGNOSIS — R600 Localized edema: Secondary | ICD-10-CM

## 2014-07-23 DIAGNOSIS — R259 Unspecified abnormal involuntary movements: Secondary | ICD-10-CM

## 2014-07-23 DIAGNOSIS — R252 Cramp and spasm: Secondary | ICD-10-CM

## 2014-07-23 DIAGNOSIS — R609 Edema, unspecified: Secondary | ICD-10-CM

## 2014-07-29 ENCOUNTER — Ambulatory Visit (INDEPENDENT_AMBULATORY_CARE_PROVIDER_SITE_OTHER): Payer: Medicare Other | Admitting: Podiatry

## 2014-07-29 ENCOUNTER — Encounter: Payer: Self-pay | Admitting: Podiatry

## 2014-07-29 VITALS — BP 127/71 | HR 93 | Resp 18

## 2014-07-29 DIAGNOSIS — L600 Ingrowing nail: Secondary | ICD-10-CM

## 2014-07-29 NOTE — Progress Notes (Signed)
Subjective:     Patient ID: Lauren Barber, female   DOB: [birth year], 58 y.o.   MRN: [id number]  HPI patient states that I have an ingrown toenail left hallux medial border that I cannot trim get better and is sore upon pressure or shoe gear   Review of Systems     Objective:   Physical Exam Neurovascular status intact   with incurvated left hallux medial border that's painful when pressed and patient having lost 60 pounds and trying to become active even though she continues to be in a gurney at the current time Assessment:     Ingrown toenail deformity left hallux medial border    Plan:     Reviewed condition and recommended correction of corner and today I infiltrated 60 mg Xylocaine Marcaine mixture remove the lateral corner exposed matrix and apply chemical phenol followed by alcohol and sterile dressing. Instructed on soaks and gave instructions on soaks

## 2014-07-29 NOTE — Patient Instructions (Signed)

## 2014-07-30 ENCOUNTER — Other Ambulatory Visit: Payer: Self-pay | Admitting: Internal Medicine

## 2014-07-30 LAB — BASIC METABOLIC PANEL
BUN: 29 mg/dL — ABNORMAL HIGH (ref 6–23)
BUN: 29 mg/dL — AB (ref 4–21)
CHLORIDE: 105 meq/L (ref 96–112)
CO2: 28 meq/L (ref 19–32)
CREATININE: 1.3 mg/dL — AB (ref 0.50–1.10)
CREATININE: 1.3 mg/dL — AB (ref 0.5–1.1)
Calcium: 9.9 mg/dL (ref 8.4–10.5)
Glucose, Bld: 146 mg/dL — ABNORMAL HIGH (ref 70–99)
Glucose: 146 mg/dL
POTASSIUM: 4.5 mmol/L (ref 3.4–5.3)
Potassium: 4.5 mEq/L (ref 3.5–5.3)
Sodium: 141 mEq/L (ref 135–145)
Sodium: 141 mmol/L (ref 137–147)

## 2014-08-10 NOTE — Progress Notes (Signed)
This encounter was created in error - please disregard.

## 2014-08-14 DIAGNOSIS — E1149 Type 2 diabetes mellitus with other diabetic neurological complication: Secondary | ICD-10-CM | POA: Insufficient documentation

## 2014-08-14 DIAGNOSIS — I82409 Acute embolism and thrombosis of unspecified deep veins of unspecified lower extremity: Secondary | ICD-10-CM | POA: Insufficient documentation

## 2014-08-14 NOTE — Progress Notes (Signed)
Patient ID: Lauren Barber, female   DOB: Jun 11, 1956, 58 y.o.   MRN: [id number]    Facility: [facility]  Chief Complaint  Patient presents with  . Acute Visit    right leg edema, muscle tightness worsened   No Known Allergies  HPI 58 y/o female patient is a long term resident here and seen for worsening edema in her legs right > left and increase in muscle spasticity. She is non compliant with her diet, does not get OOB.  Denies any new dyspnea Denies chest pain or palpitations Denies headache Denies leg cramps Has muscle spasm Her flexeril was reduced last visit  ROS No fever or chills See hpi  Past Medical History  Diagnosis Date  . Hypertension   . Obesities, morbid   . Vaginal bleeding   . Endometriosis   . Atopic conjunctivitis   . Diabetes mellitus without complication   . Cellulitis     legs  . Insomnia   . Cardiomegaly   . Gout     feet  . Anemia   . Stroke 2013  . CVA (cerebral vascular accident)   . Anxiety   . Headache(784.0)     otc meds prn  . Degenerative, intervertebral disc     back, legs  . History of blood transfusion [place] - unsure number of units transfused  . Neuromuscular disorder     diabetic neuropathy - feet  . Depressive disorder     depressive disorder   Medication reviewed. See [facility]  Physical exam BP 112/74  Pulse 94  Temp(Src) 98 F (36.7 C)  Resp 18  Wt 344 lb (156.037 kg)  SpO2 95%  Constitutional: She is oriented to person, place, and time. Morbidly obese   Neck: Neck supple. No JVD present. No thyromegaly present.   Cardiovascular: Normal rate, regular rhythm and intact distal pulses.    Respiratory: Effort normal and breath sounds normal. No respiratory distress. She has no wheezes.   GI: Soft. Bowel sounds are normal.  Musculoskeletal: Right leg edema > left, pitting edema, no muscle spasticity on exam Neurological: She is alert and oriented to person, place, and time.   Skin:  Skin is warm and dry.   Assessment/plan  Leg edema Change lasix to 40 mg bid for now, recheck bmp on a week. Advised to cut down on salt diet as pt is surrounded with soy sauce, chinese food, fried food and burgers. Already on apixaban for dvt, will not repeat test as ultrasound finding is limited with her obesity and  she is not a candidate for venogram as per vascular team. Will provide symptomatic treatment for now  Muscle spasm D/c flexeril, will have her on robaxin 500 mg q8h prn for spasm, with her immobilization and obesity, tis will be an ongoing problem

## 2014-09-09 ENCOUNTER — Non-Acute Institutional Stay (SKILLED_NURSING_FACILITY): Payer: Medicare Other | Admitting: Internal Medicine

## 2014-09-09 ENCOUNTER — Encounter: Payer: Self-pay | Admitting: Internal Medicine

## 2014-09-09 DIAGNOSIS — E1143 Type 2 diabetes mellitus with diabetic autonomic (poly)neuropathy: Secondary | ICD-10-CM

## 2014-09-09 DIAGNOSIS — F329 Major depressive disorder, single episode, unspecified: Secondary | ICD-10-CM

## 2014-09-09 DIAGNOSIS — F32A Depression, unspecified: Secondary | ICD-10-CM

## 2014-09-09 DIAGNOSIS — M109 Gout, unspecified: Secondary | ICD-10-CM

## 2014-09-09 DIAGNOSIS — K5901 Slow transit constipation: Secondary | ICD-10-CM

## 2014-09-09 DIAGNOSIS — G4700 Insomnia, unspecified: Secondary | ICD-10-CM

## 2014-09-09 NOTE — Progress Notes (Signed)
Patient ID: Lauren Barber, female   DOB: Jun 03, 1956, 58 y.o.   MRN: [id number]   Place of Service: [facility]  No Known Allergies  Code Status: Full Code  Goals of Care: Longevity/Long term care  Chief Complaint  Patient presents with  . Medical Management of Chronic Issues    old CVA, DM2 w/ peripheral neuropathy, constipation, depression, chronic pain    HPI 58 y.o. female with PMH of of old CVA, DM2 with peripheral neuropathy, constipation, depression, and chronic main among many others is being seen for a routine visit. Patient reports having numbness and tingling of L feet that sometimes radiates up her leg with adequate relief from muscle relaxant and acetaminophen. Also takes gabapentin for neuropathy but feels like she has built tolerance for it. Would like to try something else different. No other complaints reported. No change in appetite or bowel habit. Weight stable. No skin issues or falls. No concern from nursing staff.   Review of Systems Constitutional: Negative for fever, chills, and fatigue. HENT: Negative for ear pain, congestion, and sore throat Eyes: Negative for eye pain, eye discharge, and visual disturbance  Cardiovascular: Negative for chest pain, palpitations, and leg swelling Respiratory: Negative cough, shortness of breath, and wheezing.  Gastrointestinal: Negative for nausea and vomiting. Negative for abdominal pain, diarrhea and constipation.  Genitourinary: Negative for  dysuria, frequency, urgency, and hematuria Endocrine: Negative for polydipsia, polyphagia, and polyuria Musculoskeletal: Negative for back pain, joint pain, and joint swelling.  Neurological: Negative for dizziness, headache, weakness, and tremors. Positive for numbness and tingling in Left leg Skin: Negative for rash and wound.   Psychiatric: Negative for nervous/anxious, agitation, depression, and suicidal ideas. Positive for insomnia  Past Medical History    Diagnosis Date  . Hypertension   . Obesities, morbid   . Vaginal bleeding   . Endometriosis   . Atopic conjunctivitis   . Diabetes mellitus without complication   . Cellulitis     legs  . Insomnia   . Cardiomegaly   . Gout     feet  . Anemia   . Stroke 2013  . CVA (cerebral vascular accident)   . Anxiety   . Headache(784.0)     otc meds prn  . Degenerative, intervertebral disc     back, legs  . History of blood transfusion [place] - unsure number of units transfused  . Neuromuscular disorder     diabetic neuropathy - feet  . Depressive disorder     depressive disorder    Past Surgical History  Procedure Laterality Date  . Cesarean section  1982, 1989    x 2  . Biopsy endometrial N/A 10 03 2013  . Dilation and curettage of uterus  2013  . Tonsillectomy    . Wisdom tooth extraction    . Hysteroscopy w/d&c N/A 06/28/2014    Procedure: DILATATION AND CURETTAGE /HYSTEROSCOPY;  Surgeon: Lavonia Drafts, MD;  Location: [facility];  Service: Gynecology;  Laterality: N/A;    History   Social History  . Marital Status: Single    Spouse Name: N/A    Number of Children: N/A  . Years of Education: N/A   Occupational History  . Not on file.   Social History Main Topics  . Smoking status: Former Smoker -- 0.50 packs/day for 43 years    Types: Cigarettes    Quit date: 06/11/2012  . Smokeless tobacco: Never Used  . Alcohol Use: No  . Drug Use: No  .  Sexual Activity: No   Other Topics Concern  . Not on file   Social History Narrative  . No narrative on file      Medication List       This list is accurate as of: 09/09/14 10:40 AM.  Always use your most recent med list.               allopurinol 100 MG tablet  Commonly known as:  ZYLOPRIM  Take 200 mg by mouth daily.     apixaban 5 MG Tabs tablet  Commonly known as:  ELIQUIS  Take 5 mg by mouth 2 (two) times daily. For dvt     bacitracin-polymyxin b ointment  Commonly known as:   POLYSPORIN  Apply 1 application topically daily. Apply to left great toe topically every day shift for paronychia     buPROPion 75 MG tablet  Commonly known as:  WELLBUTRIN  Take 75 mg by mouth daily.     cyclobenzaprine 10 MG tablet  Commonly known as:  FLEXERIL  Take 10 mg by mouth 3 (three) times daily as needed. spasms     diphenhydrAMINE 25 MG tablet  Commonly known as:  BENADRYL  Take 25 mg by mouth every 8 (eight) hours as needed for itching. For itching     EPIPEN 0.3 mg/0.3 mL Devi  Generic drug:  EPINEPHrine  Inject 0.3 mg into the muscle once.     FLUoxetine 20 MG capsule  Commonly known as:  PROZAC  Take 60 mg by mouth daily.     furosemide 40 MG tablet  Commonly known as:  LASIX  Take 40 mg by mouth daily.     gabapentin 300 MG capsule  Commonly known as:  NEURONTIN  Take 600 mg by mouth 3 (three) times daily. For diabetic neuropathy     lactulose 10 GM/15ML solution  Commonly known as:  CHRONULAC  Take 10 g by mouth 2 (two) times daily. constipation     lisinopril 5 MG tablet  Commonly known as:  PRINIVIL,ZESTRIL  Take 5 mg by mouth daily. Hold for SBP <100     Melatonin 3 MG Tabs  Take 3 mg by mouth at bedtime.     methocarbamol 500 MG tablet  Commonly known as:  ROBAXIN  Take 500 mg by mouth every 8 (eight) hours as needed for muscle spasms.     multivitamin with minerals tablet  Take 1 tablet by mouth daily.     traMADol 50 MG tablet  Commonly known as:  ULTRAM  Take 4 tablets (200 mg total) by mouth every 12 (twelve) hours. For pain     triamcinolone ointment 0.1 %  Commonly known as:  KENALOG  Apply 1 application topically daily as needed (to legs for dermatitis).        Physical Exam Filed Vitals:   09/09/14 1031  BP: 132/70  Pulse: 89  Temp: 97.5 F (36.4 C)  Resp: 18   Constitutional: Obese  female in no acute distress. Appears stated age.  HEENT: Normocephalic and atraumatic. PERRL. EOM intact. No icterus. auricles without  lesions. No nasal discharge or sinus tenderness. Oral mucosa moist. Posterior pharynx clear of any exudate or lesions. Teeth and gingiva in good general condition.  Neck: Supple and nontender. No lymphadenopathy, masses, or thyromegaly. No JVD or carotid bruits. Cardiac: Normal S1, S2. RRR without appreciable murmurs, rubs, or gallops. Intact pulses intact. Trace pitting leg edema bilaterally Lungs: No respiratory distress. Breath sounds clear bilaterally  without rales, rhonchi, or wheezes. Abdomen: Audible bowel sounds in all quadrants. Soft, nontender, nondistended. No palpable mass.  Musculoskeletal: No joint erythema or tenderness. L hand contracted.  Spine and Back: Normal spinal profile. No scoliosis or kyphosis. No tenderness over spines. No CVA tenderness.  Skin: Warm and dry. No rash noted. No erythema.  Neurological: Alert and oriented to person, place, and time. No focal deficits. Psychiatric: Judgment and insight adequate. Appropriate mood and affect.   Labs Reviewed CBC Latest Ref Rng 07/12/2014 06/25/2014 10/23/2013  WBC - 6.0 7.5 9.0  Hemoglobin 12.0 - 16.0 g/dL 9.8(A) 9.7(L) 9.5(L)  Hematocrit 36 - 46 % 30(A) 31.3(L) 28.6(L)  Platelets 150 - 399 K/L 285 288 303   CMP     Component Value Date/Time   NA 141 07/30/2014 0615   NA 141 07/30/2014   K 4.5 07/30/2014 0615   CL 105 07/30/2014 0615   CO2 28 07/30/2014 0615   GLUCOSE 146* 07/30/2014 0615   BUN 29* 07/30/2014 0615   BUN 29* 07/30/2014   CREATININE 1.30* 07/30/2014 0615   CREATININE 1.3* 07/30/2014   CREATININE 1.82* 06/25/2014 0911   CALCIUM 9.9 07/30/2014 0615   PROT 7.7 10/23/2013 2309   ALBUMIN 3.4* 10/23/2013 2309   AST 20 10/23/2013 2309   ALT 27 10/23/2013 2309   ALKPHOS 94 10/23/2013 2309   BILITOT 0.2* 10/23/2013 2309   GFRNONAA 30* 06/25/2014 0911   GFRAA 35* 06/25/2014 0911   Lab Results  Component Value Date   HGBA1C 6.4* 07/12/2014     Assessment & Plan 1. Diabetic autonomic neuropathy associated with type 2  diabetes mellitus A1C improved, most recent was 6.4 on 07/12/14. Will d/c gabapentin for ineffectivenes -Start patient on amitriptyline 50 mg daily at bedtime for neuropathy for 2 weeks and if no improvement, increase to 100 mg daily. will reevaluate   2. Slow transit constipation Stable. Continue lactulose solution 10g twice daily. Encourage participation with physical activities and increasing fluid intake. Continue to monitor   3. Gout, unspecified cause, unspecified chronicity, unspecified site Stable.  Continue allopurinol 200mg  daily and monitor  4. Depression Stable. Continue prozac 20mg  daily and wellbutrin 150mg  daily.   5. Insomnia Continue melatonin for now  [place] of care discuss with resident and professional staff members. Resident and professional staff members verbalize understanding and agree with plan of care. No additional questions or concerns reported.    Arthur Holms, MSN, AGNP-C [address] [place], [place] [address] [phone] [8am-5pm] After hours: [phone]  I have personally reviewed this note and agree with the care plan  [facility], MD  [facility] [phone] (Monday-Friday 8 am - 5 pm) [phone] (afterhours)

## 2014-09-20 ENCOUNTER — Encounter: Payer: Self-pay | Admitting: Internal Medicine

## 2014-10-27 ENCOUNTER — Non-Acute Institutional Stay (SKILLED_NURSING_FACILITY): Payer: Medicare Other | Admitting: Adult Health

## 2014-10-27 DIAGNOSIS — K5900 Constipation, unspecified: Secondary | ICD-10-CM

## 2014-10-27 DIAGNOSIS — F329 Major depressive disorder, single episode, unspecified: Secondary | ICD-10-CM

## 2014-10-27 DIAGNOSIS — G8929 Other chronic pain: Secondary | ICD-10-CM

## 2014-10-27 DIAGNOSIS — R609 Edema, unspecified: Secondary | ICD-10-CM

## 2014-10-27 DIAGNOSIS — E1143 Type 2 diabetes mellitus with diabetic autonomic (poly)neuropathy: Secondary | ICD-10-CM

## 2014-10-27 DIAGNOSIS — F32A Depression, unspecified: Secondary | ICD-10-CM

## 2014-10-27 DIAGNOSIS — M109 Gout, unspecified: Secondary | ICD-10-CM

## 2014-10-27 DIAGNOSIS — I10 Essential (primary) hypertension: Secondary | ICD-10-CM

## 2014-10-27 DIAGNOSIS — I82409 Acute embolism and thrombosis of unspecified deep veins of unspecified lower extremity: Secondary | ICD-10-CM

## 2014-11-10 ENCOUNTER — Encounter: Payer: Self-pay | Admitting: Adult Health

## 2014-11-10 MED ORDER — METHOCARBAMOL 500 MG PO TABS: 500.0000 mg | ORAL_TABLET | Freq: Three times a day (TID) | ORAL | Status: DC

## 2014-11-10 NOTE — Progress Notes (Signed)
Patient ID: Lauren Barber, female   DOB: November 17, 1956, 58 y.o.   MRN: [id number]  [name]      No Known Allergies     Chief Complaint  Patient presents with  . Medical Management of Chronic Issues    HPI:  She is a long term resident of this facility being seen for the management of her chronic illnesses. Overall her status remains stable. Her diabetes is under control and she presently not taking medications. She is complaining of muscle spasms "all over". There are no nursing concerns at this time.   Past Medical History  Diagnosis Date  . Hypertension   . Obesities, morbid   . Vaginal bleeding   . Endometriosis   . Atopic conjunctivitis   . Diabetes mellitus without complication   . Cellulitis     legs  . Insomnia   . Cardiomegaly   . Gout     feet  . Anemia   . Stroke 2013  . CVA (cerebral vascular accident)   . Anxiety   . Headache(784.0)     otc meds prn  . Degenerative, intervertebral disc     back, legs  . History of blood transfusion [place] - unsure number of units transfused  . Neuromuscular disorder     diabetic neuropathy - feet  . Depressive disorder     depressive disorder    Past Surgical History  Procedure Laterality Date  . Cesarean section  1982, 1989    x 2  . Biopsy endometrial N/A 10 03 2013  . Dilation and curettage of uterus  2013  . Tonsillectomy    . Wisdom tooth extraction    . Hysteroscopy w/d&c N/A 06/28/2014    Procedure: DILATATION AND CURETTAGE /HYSTEROSCOPY;  Surgeon: Lavonia Drafts, MD;  Location: [facility];  Service: Gynecology;  Laterality: N/A;    VITAL SIGNS BP 131/62 mmHg  Pulse 74  Ht 5\' 9"  (1.753 m)  Wt 344 lb (156.037 kg)  BMI 50.78 kg/m2  SpO2 98%   Outpatient Encounter Prescriptions as of 10/27/2014  Medication Sig  . allopurinol (ZYLOPRIM) 100 MG tablet Take 200 mg by mouth daily.  Marland Kitchen amitriptyline (ELAVIL) 100 MG tablet Take 100 mg by mouth at bedtime.  Marland Kitchen apixaban (ELIQUIS) 5 MG  TABS tablet Take 5 mg by mouth 2 (two) times daily. For dvt  . diphenhydrAMINE (BENADRYL) 25 MG tablet Take 25 mg by mouth every 8 (eight) hours as needed for itching. For itching  . EPINEPHrine (EPIPEN) 0.3 mg/0.3 mL DEVI Inject 0.3 mg into the muscle once.  Marland Kitchen FLUoxetine (PROZAC) 20 MG capsule Take 60 mg by mouth daily.  . furosemide (LASIX) 40 MG tablet Take 40 mg by mouth 2 (two) times daily.   Marland Kitchen lactulose (CHRONULAC) 10 GM/15ML solution Take 10 g by mouth 2 (two) times daily. constipation  . lisinopril (PRINIVIL,ZESTRIL) 5 MG tablet Take 5 mg by mouth daily. Hold for SBP <100  . Melatonin 3 MG TABS Take 3 mg by mouth at bedtime.  . methocarbamol (ROBAXIN) 500 MG tablet Take 500 mg by mouth every 8 (eight) hours as needed for muscle spasms.  . Multiple Vitamins-Minerals (MULTIVITAMIN WITH MINERALS) tablet Take 1 tablet by mouth daily.  . traMADol (ULTRAM) 50 MG tablet Take 4 tablets (200 mg total) by mouth every 12 (twelve) hours. For pain  . triamcinolone ointment (KENALOG) 0.1 % Apply 1 application topically daily as needed (to legs for dermatitis).  . [DISCONTINUED] bacitracin-polymyxin b (  POLYSPORIN) ointment Apply 1 application topically daily. Apply to left great toe topically every day shift for paronychia  . [DISCONTINUED] buPROPion (WELLBUTRIN) 75 MG tablet Take 75 mg by mouth daily.   . [DISCONTINUED] cyclobenzaprine (FLEXERIL) 10 MG tablet Take 10 mg by mouth 3 (three) times daily as needed. spasms  . [DISCONTINUED] gabapentin (NEURONTIN) 300 MG capsule Take 600 mg by mouth 3 (three) times daily. For diabetic neuropathy     SIGNIFICANT DIAGNOSTIC EXAMS  LABS REVIEWED:   07-12-14: wbc 6.0; hgb 9.8; hct 29.9 ;mcv 93.4; lt 285;  07-30-14: glucose 146; bun 29; creat 1.3; k+4.5 ;na+= 141 09-10-14: hgb a1c 6.4 10-25-14: hgb a1c 6.5    Review of Systems  Constitutional: Negative for malaise/fatigue.  Respiratory: Negative for cough and shortness of breath.   Cardiovascular:  Negative for chest pain, palpitations and leg swelling.  Gastrointestinal: Negative for heartburn and abdominal pain.  Musculoskeletal: Positive for myalgias. Negative for joint pain.       Does have muscle spasms   Skin: Negative.   Psychiatric/Behavioral: Negative for depression. The patient is not nervous/anxious.      Physical Exam  Constitutional: She is oriented to person, place, and time. No distress.  Morbidly obese   Neck: Neck supple. No JVD present.  Cardiovascular: Normal rate, regular rhythm and intact distal pulses.   Respiratory: Effort normal and breath sounds normal. No respiratory distress. She has no wheezes.  GI: Soft. Bowel sounds are normal. She exhibits no distension. There is no tenderness.  Musculoskeletal: She exhibits no edema.  Is able to move all extremities; with limitations due to obesity; has left hand contracture.   Neurological: She is alert and oriented to person, place, and time.  Skin: Skin is warm. She is not diaphoretic.       ASSESSMENT/ PLAN:  1. Diabetes: she is presently stable her hgb a1c is 6.5. She is presently not taking medications. Will not make changes will monitor her status.  She has not been on insulin since August 2015.   2. Gout: no recent flares; will continue allopurinol 200 mg daily will monitor   3. CVA is neurologically stable; will continue eliquis 5 mg twice daily and will monitor   4. DVT: is presently stable will continue eliquis 5 mg twice daily and will monitor   5. chornic pain: for her increased muscle spasms will change  Her robaxin to 500 mg every 8 hours routinely; will continue ultram 200 mg twice daily; elavil 100 mg nightly and will monitor   6. Hypertension: will continue lisinopril 5 mg daily; and will monitor   7. Edema: she is stable will continue lasix 40 mg twice daily and will monitor  8. Depression: she is stable is receiving benefit from prozac 60 mg daily will continue melatonin 3 mg nightly  for sleep  will not make changes will monitor  9. Constipation: will continue lactulose 15 cc twice daily   Will check lipids and urine for micro-albumin   Ok Edwards NP [facility]  Contact [phone] Monday through Friday 8am- 5pm  After hours call [phone]

## 2014-12-03 ENCOUNTER — Non-Acute Institutional Stay (SKILLED_NURSING_FACILITY): Payer: Medicare Other | Admitting: Adult Health

## 2014-12-03 DIAGNOSIS — I82409 Acute embolism and thrombosis of unspecified deep veins of unspecified lower extremity: Secondary | ICD-10-CM

## 2014-12-03 DIAGNOSIS — E1143 Type 2 diabetes mellitus with diabetic autonomic (poly)neuropathy: Secondary | ICD-10-CM

## 2014-12-03 DIAGNOSIS — G8929 Other chronic pain: Secondary | ICD-10-CM

## 2014-12-03 DIAGNOSIS — R609 Edema, unspecified: Secondary | ICD-10-CM

## 2014-12-03 DIAGNOSIS — I10 Essential (primary) hypertension: Secondary | ICD-10-CM

## 2014-12-13 ENCOUNTER — Non-Acute Institutional Stay (SKILLED_NURSING_FACILITY): Payer: Medicare Other | Admitting: Adult Health

## 2014-12-13 ENCOUNTER — Encounter: Payer: Self-pay | Admitting: Adult Health

## 2014-12-13 ENCOUNTER — Other Ambulatory Visit: Payer: Self-pay | Admitting: Internal Medicine

## 2014-12-13 DIAGNOSIS — R51 Headache: Secondary | ICD-10-CM

## 2014-12-13 DIAGNOSIS — F329 Major depressive disorder, single episode, unspecified: Secondary | ICD-10-CM

## 2014-12-13 DIAGNOSIS — C8410 Sezary disease, unspecified site: Secondary | ICD-10-CM

## 2014-12-13 DIAGNOSIS — R569 Unspecified convulsions: Secondary | ICD-10-CM | POA: Insufficient documentation

## 2014-12-13 DIAGNOSIS — R519 Headache, unspecified: Secondary | ICD-10-CM

## 2014-12-13 DIAGNOSIS — F32A Depression, unspecified: Secondary | ICD-10-CM

## 2014-12-13 MED ORDER — LEVETIRACETAM 500 MG PO TABS: 500.0000 mg | ORAL_TABLET | Freq: Two times a day (BID) | ORAL | Status: DC

## 2014-12-13 MED ORDER — LEVETIRACETAM 250 MG PO TABS: 250.0000 mg | ORAL_TABLET | Freq: Two times a day (BID) | ORAL | Status: DC

## 2014-12-13 MED ORDER — FLUOXETINE HCL 20 MG PO CAPS: 40.0000 mg | ORAL_CAPSULE | Freq: Every day | ORAL | Status: DC

## 2014-12-13 NOTE — Progress Notes (Signed)
Patient ID: Lauren Barber, female   DOB: Oct 23, 1956, 59 y.o.   MRN: [id number]  [name] [place]     No Known Allergies     Chief Complaint  Patient presents with  . Medical Management of Chronic Issues  . Acute Visit    weight loss    HPI:  She is a long term resident of this facility being seen for the management of her chronic illnesses. She has lost weight from 351 pounds to 332 pounds she has been trying to lose weight. She is not voicing any complaints. There are no nursing complaints at this time.    Past Medical History  Diagnosis Date  . Hypertension   . Obesities, morbid   . Vaginal bleeding   . Endometriosis   . Atopic conjunctivitis   . Diabetes mellitus without complication   . Cellulitis     legs  . Insomnia   . Cardiomegaly   . Gout     feet  . Anemia   . Stroke 2013  . CVA (cerebral vascular accident)   . Anxiety   . Headache(784.0)     otc meds prn  . Degenerative, intervertebral disc     back, legs  . History of blood transfusion [place] - unsure number of units transfused  . Neuromuscular disorder     diabetic neuropathy - feet  . Depressive disorder     depressive disorder    Past Surgical History  Procedure Laterality Date  . Cesarean section  1982, 1989    x 2  . Biopsy endometrial N/A 10 03 2013  . Dilation and curettage of uterus  2013  . Tonsillectomy    . Wisdom tooth extraction    . Hysteroscopy w/d&c N/A 06/28/2014    Procedure: DILATATION AND CURETTAGE /HYSTEROSCOPY;  Surgeon: Lavonia Drafts, MD;  Location: [facility];  Service: Gynecology;  Laterality: N/A;    VITAL SIGNS BP 128/72 mmHg  Pulse 78  Ht 5\' 9"  (1.753 m)  Wt 332 lb (150.594 kg)  BMI 49.01 kg/m2   Outpatient Encounter Prescriptions as of 12/03/2014  Medication Sig  . allopurinol (ZYLOPRIM) 100 MG tablet Take 200 mg by mouth daily.  Marland Kitchen amitriptyline (ELAVIL) 100 MG tablet Take 100 mg by mouth at bedtime.  Marland Kitchen apixaban (ELIQUIS) 5 MG  TABS tablet Take 5 mg by mouth 2 (two) times daily. For dvt  . diphenhydrAMINE (BENADRYL) 25 MG tablet Take 25 mg by mouth every 8 (eight) hours as needed for itching. For itching  . EPINEPHrine (EPIPEN) 0.3 mg/0.3 mL DEVI Inject 0.3 mg into the muscle once.  Marland Kitchen FLUoxetine (PROZAC) 20 MG capsule Take 60 mg by mouth daily.  . furosemide (LASIX) 40 MG tablet Take 40 mg by mouth 2 (two) times daily.   Marland Kitchen lactulose (CHRONULAC) 10 GM/15ML solution Take 10 g by mouth 2 (two) times daily. constipation  . lisinopril (PRINIVIL,ZESTRIL) 5 MG tablet Take 5 mg by mouth daily. Hold for SBP <100  . Melatonin 3 MG TABS Take 3 mg by mouth at bedtime.  . zanaflex 4 mg  Take 1 tablet every 8 hours as needed   . Multiple Vitamins-Minerals (MULTIVITAMIN WITH MINERALS) tablet Take 1 tablet by mouth daily.  . traMADol (ULTRAM) 50 MG tablet Take 4 tablets (200 mg total) by mouth every 12 (twelve) hours. For pain  . triamcinolone ointment (KENALOG) 0.1 % Apply 1 application topically daily as needed (to legs for dermatitis).     SIGNIFICANT  DIAGNOSTIC EXAMS   LABS REVIEWED:   07-12-14: wbc 6.0; hgb 9.8; hct 29.9 ;mcv 93.4; lt 285;  07-30-14: glucose 146; bun 29; creat 1.3; k+4.5 ;na++ 141 09-10-14: hgb a1c 6.4 10-25-14: hgb a1c 6.5 10-29-14: chol 146; ldl 89; trig 127; hdl 32; urine micro-albumin <2.0 11-03-14: wbc 9.5; hgb 8.1; hct 25.3; mcv 96.;3 plt 268     ROS Constitutional: Negative for malaise/fatigue.  Respiratory: Negative for cough and shortness of breath.   Cardiovascular: Negative for chest pain, palpitations and leg swelling.  Gastrointestinal: Negative for heartburn and abdominal pain.  Musculoskeletal: Positive for myalgias. Negative for joint pain.       Is managed   Skin: Negative.   Psychiatric/Behavioral: Negative for depression. The patient is not nervous/anxious.      Physical Exam Constitutional: She is oriented to person, place, and time. No distress.  Morbidly obese   Neck:  Neck supple. No JVD present.  Cardiovascular: Normal rate, regular rhythm and intact distal pulses.   Respiratory: Effort normal and breath sounds normal. No respiratory distress. She has no wheezes.  GI: Soft. Bowel sounds are normal. She exhibits no distension. There is no tenderness.  Musculoskeletal: She exhibits no edema.  Is able to move all extremities; with limitations due to obesity; has left hand contracture.   Neurological: She is alert and oriented to person, place, and time.  Skin: Skin is warm. She is not diaphoretic    ASSESSMENT/ PLAN:   1. Diabetes: she is presently stable her hgb a1c is 6.5. She is presently not taking medications. Will not make changes will monitor her status.  She has not been on insulin since August 2015.   2. Gout: no recent flares; will continue allopurinol 200 mg daily will monitor   3. CVA is neurologically stable; will continue eliquis 5 mg twice daily and will monitor   4. DVT: is presently stable will continue eliquis 5 mg twice daily and will monitor   5. chornic pain: her pain is managed   Will continue zanaflex 4 mg every 8 hours as needed  will continue ultram 200 mg twice daily; elavil 100 mg nightly and will monitor   6. Hypertension: will continue lisinopril 5 mg daily; and will monitor   7. Edema: she is stable will continue lasix 40 mg twice daily and will monitor  8. Depression: she is stable is receiving benefit from prozac 60 mg daily will continue melatonin 3 mg nightly for sleep  will not make changes will monitor  9. Constipation: will continue lactulose 15 cc twice daily    Ok Edwards NP [facility]  Contact [phone] Monday through Friday 8am- 5pm  After hours call [phone]

## 2014-12-13 NOTE — Progress Notes (Signed)
Patient ID: Lauren Barber, female   DOB: May 02, 1956, 59 y.o.   MRN: [id number]   Lauren Barber living [place]     No Known Allergies     Chief Complaint  Patient presents with  . Acute Visit    new onset seizure     HPI:  She is complaining of "total body" muscle spasms. I was able to witness a seizure involving her arms; legs. She did not lose consciousness; was incontinent; did become disoriented for a short period of time. The seizure lasted for 30 seconds.    Past Medical History  Diagnosis Date  . Hypertension   . Obesities, morbid   . Vaginal bleeding   . Endometriosis   . Atopic conjunctivitis   . Diabetes mellitus without complication   . Cellulitis     legs  . Insomnia   . Cardiomegaly   . Gout     feet  . Anemia   . Stroke 2013  . CVA (cerebral vascular accident)   . Anxiety   . Headache(784.0)     otc meds prn  . Degenerative, intervertebral disc     back, legs  . History of blood transfusion [place] - unsure number of units transfused  . Neuromuscular disorder     diabetic neuropathy - feet  . Depressive disorder     depressive disorder    Past Surgical History  Procedure Laterality Date  . Cesarean section  1982, 1989    x 2  . Biopsy endometrial N/A 10 03 2013  . Dilation and curettage of uterus  2013  . Tonsillectomy    . Wisdom tooth extraction    . Hysteroscopy w/d&c N/A 06/28/2014    Procedure: DILATATION AND CURETTAGE /HYSTEROSCOPY;  Surgeon: Lavonia Drafts, MD;  Location: [facility];  Service: Gynecology;  Laterality: N/A;    VITAL SIGNS BP 103/59 mmHg  Pulse 81  Ht 5\' 9"  (1.753 m)  Wt 332 lb (150.594 kg)  BMI 49.01 kg/m2  SpO2 98%   Outpatient Encounter Prescriptions as of 12/13/2014  Medication Sig  . tiZANidine (ZANAFLEX) 4 MG tablet Take 4 mg by mouth every 8 (eight) hours as needed for muscle spasms.  Marland Kitchen allopurinol (ZYLOPRIM) 100 MG tablet Take 200 mg by mouth daily.  Marland Kitchen amitriptyline (ELAVIL) 100 MG tablet  Take 100 mg by mouth at bedtime.  Marland Kitchen apixaban (ELIQUIS) 5 MG TABS tablet Take 5 mg by mouth 2 (two) times daily. For dvt  . diphenhydrAMINE (BENADRYL) 25 MG tablet Take 25 mg by mouth every 8 (eight) hours as needed for itching. For itching  . EPINEPHrine (EPIPEN) 0.3 mg/0.3 mL DEVI Inject 0.3 mg into the muscle once.  Marland Kitchen FLUoxetine (PROZAC) 20 MG capsule Take 60 mg by mouth daily.  . furosemide (LASIX) 40 MG tablet Take 40 mg by mouth 2 (two) times daily.   Marland Kitchen lactulose (CHRONULAC) 10 GM/15ML solution Take 10 g by mouth 2 (two) times daily. constipation  . lisinopril (PRINIVIL,ZESTRIL) 5 MG tablet Take 5 mg by mouth daily. Hold for SBP <100  . Melatonin 3 MG TABS Take 3 mg by mouth at bedtime.  . Multiple Vitamins-Minerals (MULTIVITAMIN WITH MINERALS) tablet Take 1 tablet by mouth daily.  . traMADol (ULTRAM) 50 MG tablet Take 4 tablets (200 mg total) by mouth every 12 (twelve) hours. For pain  . triamcinolone ointment (KENALOG) 0.1 % Apply 1 application topically daily as needed (to legs for dermatitis).  . [DISCONTINUED] methocarbamol (ROBAXIN) 500 MG tablet  Take 1 tablet (500 mg total) by mouth 3 (three) times daily.     SIGNIFICANT DIAGNOSTIC EXAMS   LABS REVIEWED:   07-12-14: wbc 6.0; hgb 9.8; hct 29.9 ;mcv 93.4; lt 285;  07-30-14: glucose 146; bun 29; creat 1.3; k+4.5 ;na++ 141 09-10-14: hgb a1c 6.4 10-25-14: hgb a1c 6.5 10-29-14: chol 146; ldl 89; trig 127; hdl 32; urine micro-albumin <2.0 11-03-14: wbc 9.5; hgb 8.1; hct 25.3; mcv 96.;3 plt 268     ROS  Constitutional: Negative for malaise/fatigue.  Respiratory: Negative for cough and shortness of breath.   Cardiovascular: Negative for chest pain, palpitations and leg swelling.  Gastrointestinal: Negative for heartburn and abdominal pain.  Musculoskeletal: Positive for myalgias. Negative for joint pain.       Is managed   Skin: Negative.   Psychiatric/Behavioral: Negative for depression. The patient is not nervous/anxious.     Physical Exam Constitutional: She is oriented to person, place, and time. No distress.  Morbidly obese   Neck: Neck supple. No JVD present.  Cardiovascular: Normal rate, regular rhythm and intact distal pulses.   Respiratory: Effort normal and breath sounds normal. No respiratory distress. She has no wheezes.  GI: Soft. Bowel sounds are normal. She exhibits no distension. There is no tenderness.  Musculoskeletal: She exhibits no edema.  Is able to move all extremities; with limitations due to obesity; has left hand contracture.   Neurological: She is alert and oriented to person, place, and time.  cn grossly intact  Skin: Skin is warm. She is not diaphoretic    ASSESSMENT/ PLAN:  1. New onset seizure: will get cbc; cmp; vit b12; folate; tsh; ct of head with contrast; neurology consult; will begin keppra 250 mg twice daily for 3 days then 500 mg twice daily will monitor her status.   2. Depression: will lower her prozac to 40 mg daily; with her seizure activity to raise her threshold.    Ok Edwards NP [facility]  Contact [phone] Monday through Friday 8am- 5pm  After hours call [phone]

## 2014-12-14 ENCOUNTER — Other Ambulatory Visit: Payer: Self-pay | Admitting: Internal Medicine

## 2014-12-14 ENCOUNTER — Emergency Department (HOSPITAL_COMMUNITY): Payer: Medicare Other

## 2014-12-14 ENCOUNTER — Encounter (HOSPITAL_COMMUNITY): Payer: Self-pay | Admitting: General Practice

## 2014-12-14 ENCOUNTER — Inpatient Hospital Stay (HOSPITAL_COMMUNITY)
Admission: EM | Admit: 2014-12-14 | Discharge: 2014-12-19 | DRG: 871 | Disposition: A | Discharge status: Skilled Nursing Facility | Payer: Medicare Other | Attending: Internal Medicine | Admitting: Internal Medicine

## 2014-12-14 DIAGNOSIS — L89151 Pressure ulcer of sacral region, stage 1: Secondary | ICD-10-CM | POA: Diagnosis present

## 2014-12-14 DIAGNOSIS — F419 Anxiety disorder, unspecified: Secondary | ICD-10-CM | POA: Diagnosis not present

## 2014-12-14 DIAGNOSIS — I69354 Hemiplegia and hemiparesis following cerebral infarction affecting left non-dominant side: Secondary | ICD-10-CM | POA: Diagnosis not present

## 2014-12-14 DIAGNOSIS — IMO0002 Reserved for concepts with insufficient information to code with codable children: Secondary | ICD-10-CM

## 2014-12-14 DIAGNOSIS — D62 Acute posthemorrhagic anemia: Secondary | ICD-10-CM | POA: Diagnosis present

## 2014-12-14 DIAGNOSIS — M545 Low back pain, unspecified: Secondary | ICD-10-CM

## 2014-12-14 DIAGNOSIS — Z7901 Long term (current) use of anticoagulants: Secondary | ICD-10-CM

## 2014-12-14 DIAGNOSIS — G8929 Other chronic pain: Secondary | ICD-10-CM | POA: Diagnosis not present

## 2014-12-14 DIAGNOSIS — E1165 Type 2 diabetes mellitus with hyperglycemia: Secondary | ICD-10-CM | POA: Diagnosis present

## 2014-12-14 DIAGNOSIS — E1143 Type 2 diabetes mellitus with diabetic autonomic (poly)neuropathy: Secondary | ICD-10-CM | POA: Diagnosis not present

## 2014-12-14 DIAGNOSIS — Z6841 Body Mass Index (BMI) 40.0 and over, adult: Secondary | ICD-10-CM

## 2014-12-14 DIAGNOSIS — K5900 Constipation, unspecified: Secondary | ICD-10-CM | POA: Diagnosis present

## 2014-12-14 DIAGNOSIS — E872 Acidosis: Secondary | ICD-10-CM | POA: Diagnosis present

## 2014-12-14 DIAGNOSIS — I129 Hypertensive chronic kidney disease with stage 1 through stage 4 chronic kidney disease, or unspecified chronic kidney disease: Secondary | ICD-10-CM | POA: Diagnosis present

## 2014-12-14 DIAGNOSIS — Z87891 Personal history of nicotine dependence: Secondary | ICD-10-CM | POA: Diagnosis not present

## 2014-12-14 DIAGNOSIS — N938 Other specified abnormal uterine and vaginal bleeding: Secondary | ICD-10-CM | POA: Diagnosis not present

## 2014-12-14 DIAGNOSIS — M109 Gout, unspecified: Secondary | ICD-10-CM | POA: Diagnosis present

## 2014-12-14 DIAGNOSIS — I10 Essential (primary) hypertension: Secondary | ICD-10-CM

## 2014-12-14 DIAGNOSIS — Z7401 Bed confinement status: Secondary | ICD-10-CM

## 2014-12-14 DIAGNOSIS — A419 Sepsis, unspecified organism: Secondary | ICD-10-CM | POA: Diagnosis present

## 2014-12-14 DIAGNOSIS — F329 Major depressive disorder, single episode, unspecified: Secondary | ICD-10-CM | POA: Diagnosis not present

## 2014-12-14 DIAGNOSIS — Z993 Dependence on wheelchair: Secondary | ICD-10-CM

## 2014-12-14 DIAGNOSIS — N170 Acute kidney failure with tubular necrosis: Secondary | ICD-10-CM | POA: Diagnosis not present

## 2014-12-14 DIAGNOSIS — Z22322 Carrier or suspected carrier of Methicillin resistant Staphylococcus aureus: Secondary | ICD-10-CM | POA: Diagnosis not present

## 2014-12-14 DIAGNOSIS — N390 Urinary tract infection, site not specified: Secondary | ICD-10-CM | POA: Diagnosis not present

## 2014-12-14 DIAGNOSIS — D649 Anemia, unspecified: Secondary | ICD-10-CM | POA: Diagnosis not present

## 2014-12-14 DIAGNOSIS — R6510 Systemic inflammatory response syndrome (SIRS) of non-infectious origin without acute organ dysfunction: Secondary | ICD-10-CM | POA: Diagnosis present

## 2014-12-14 DIAGNOSIS — N184 Chronic kidney disease, stage 4 (severe): Secondary | ICD-10-CM | POA: Diagnosis present

## 2014-12-14 DIAGNOSIS — I82409 Acute embolism and thrombosis of unspecified deep veins of unspecified lower extremity: Secondary | ICD-10-CM

## 2014-12-14 DIAGNOSIS — R229 Localized swelling, mass and lump, unspecified: Secondary | ICD-10-CM

## 2014-12-14 DIAGNOSIS — IMO0001 Reserved for inherently not codable concepts without codable children: Secondary | ICD-10-CM | POA: Diagnosis present

## 2014-12-14 DIAGNOSIS — Z86718 Personal history of other venous thrombosis and embolism: Secondary | ICD-10-CM

## 2014-12-14 DIAGNOSIS — R569 Unspecified convulsions: Secondary | ICD-10-CM

## 2014-12-14 DIAGNOSIS — Z794 Long term (current) use of insulin: Secondary | ICD-10-CM | POA: Diagnosis not present

## 2014-12-14 DIAGNOSIS — N179 Acute kidney failure, unspecified: Secondary | ICD-10-CM | POA: Insufficient documentation

## 2014-12-14 LAB — COMPREHENSIVE METABOLIC PANEL
ALBUMIN: 3.4 g/dL — AB (ref 3.5–5.2)
ALK PHOS: 111 U/L (ref 39–117)
ALT: 13 U/L (ref 0–35)
ALT: 15 U/L (ref 0–35)
ANION GAP: 13 (ref 5–15)
AST: 14 U/L (ref 0–37)
AST: 16 U/L (ref 0–37)
Albumin: 3.5 g/dL (ref 3.5–5.2)
Alkaline Phosphatase: 112 U/L (ref 39–117)
BUN: 89 mg/dL — AB (ref 6–23)
BUN: 97 mg/dL — ABNORMAL HIGH (ref 6–23)
CALCIUM: 9.7 mg/dL (ref 8.4–10.5)
CHLORIDE: 104 mmol/L (ref 96–112)
CO2: 16 mmol/L — ABNORMAL LOW (ref 19–32)
CO2: 17 meq/L — AB (ref 19–32)
CREATININE: 4.15 mg/dL — AB (ref 0.50–1.10)
CREATININE: 4.74 mg/dL — AB (ref 0.50–1.10)
Calcium: 9.2 mg/dL (ref 8.4–10.5)
Chloride: 104 mEq/L (ref 96–112)
GFR calc non Af Amer: 9 mL/min — ABNORMAL LOW (ref >90)
GFR, EST AFRICAN AMERICAN: 11 mL/min — AB (ref >90)
GLUCOSE: 79 mg/dL (ref 70–99)
Glucose, Bld: 95 mg/dL (ref 70–99)
Potassium: 4.4 mEq/L (ref 3.5–5.3)
Potassium: 4.5 mmol/L (ref 3.5–5.1)
SODIUM: 133 meq/L — AB (ref 135–145)
Sodium: 133 mmol/L — ABNORMAL LOW (ref 135–145)
TOTAL PROTEIN: 8.4 g/dL — AB (ref 6.0–8.3)
Total Bilirubin: 0.3 mg/dL (ref 0.2–1.2)
Total Bilirubin: 0.4 mg/dL (ref 0.3–1.2)
Total Protein: 7.3 g/dL (ref 6.0–8.3)

## 2014-12-14 LAB — HEMOGLOBIN AND HEMATOCRIT, BLOOD
HEMATOCRIT: 21.6 % — AB (ref 36.0–46.0)
HEMOGLOBIN: 7.1 g/dL — AB (ref 12.0–15.0)

## 2014-12-14 LAB — B12 AND FOLATE PANEL
FOLATE: 15.5 ng/mL
Vitamin B-12: 1064 pg/mL — ABNORMAL HIGH (ref 211–911)

## 2014-12-14 LAB — CBC
HCT: 22.8 % — ABNORMAL LOW (ref 36.0–46.0)
Hemoglobin: 7.6 g/dL — ABNORMAL LOW (ref 12.0–15.0)
MCH: 31 pg (ref 26.0–34.0)
MCHC: 33.3 g/dL (ref 30.0–36.0)
MCV: 93.1 fL (ref 78.0–100.0)
MPV: 8.5 fL — AB (ref 8.6–12.4)
PLATELETS: 356 10*3/uL (ref 150–400)
RBC: 2.45 MIL/uL — ABNORMAL LOW (ref 3.87–5.11)
RDW: 16.3 % — ABNORMAL HIGH (ref 11.5–15.5)
WBC: 10.4 10*3/uL (ref 4.0–10.5)

## 2014-12-14 LAB — URINE MICROSCOPIC-ADD ON

## 2014-12-14 LAB — URINALYSIS, ROUTINE W REFLEX MICROSCOPIC
Glucose, UA: NEGATIVE mg/dL
Hgb urine dipstick: NEGATIVE
Ketones, ur: 15 mg/dL — AB
NITRITE: NEGATIVE
Protein, ur: NEGATIVE mg/dL
SPECIFIC GRAVITY, URINE: 1.018 (ref 1.005–1.030)
Urobilinogen, UA: 1 mg/dL (ref 0.0–1.0)
pH: 7 (ref 5.0–8.0)

## 2014-12-14 LAB — CBC WITH DIFFERENTIAL/PLATELET
BASOS PCT: 0 % (ref 0–1)
Basophils Absolute: 0 10*3/uL (ref 0.0–0.1)
Eosinophils Absolute: 0.2 10*3/uL (ref 0.0–0.7)
Eosinophils Relative: 2 % (ref 0–5)
HCT: 22.7 % — ABNORMAL LOW (ref 36.0–46.0)
Hemoglobin: 7.5 g/dL — ABNORMAL LOW (ref 12.0–15.0)
LYMPHS PCT: 26 % (ref 12–46)
Lymphs Abs: 2.3 10*3/uL (ref 0.7–4.0)
MCH: 30.2 pg (ref 26.0–34.0)
MCHC: 33 g/dL (ref 30.0–36.0)
MCV: 91.5 fL (ref 78.0–100.0)
Monocytes Absolute: 0.4 10*3/uL (ref 0.1–1.0)
Monocytes Relative: 4 % (ref 3–12)
Neutro Abs: 6.2 10*3/uL (ref 1.7–7.7)
Neutrophils Relative %: 68 % (ref 43–77)
Platelets: 339 10*3/uL (ref 150–400)
RBC: 2.48 MIL/uL — ABNORMAL LOW (ref 3.87–5.11)
RDW: 15 % (ref 11.5–15.5)
WBC: 9.1 10*3/uL (ref 4.0–10.5)

## 2014-12-14 LAB — CK: Total CK: 175 U/L (ref 7–177)

## 2014-12-14 LAB — RETICULOCYTES
RBC.: 2.36 MIL/uL — ABNORMAL LOW (ref 3.87–5.11)
RETIC COUNT ABSOLUTE: 56.6 10*3/uL (ref 19.0–186.0)
Retic Ct Pct: 2.4 % (ref 0.4–3.1)

## 2014-12-14 LAB — LACTIC ACID, PLASMA: Lactic Acid, Venous: 1.2 mmol/L (ref 0.5–2.0)

## 2014-12-14 LAB — LACTATE DEHYDROGENASE: LDH: 127 U/L (ref 94–250)

## 2014-12-14 LAB — RAPID URINE DRUG SCREEN, HOSP PERFORMED
Amphetamines: NOT DETECTED
Barbiturates: NOT DETECTED
Benzodiazepines: NOT DETECTED
Cocaine: NOT DETECTED
OPIATES: NOT DETECTED
Tetrahydrocannabinol: NOT DETECTED

## 2014-12-14 LAB — TSH
TSH: 3.898 u[IU]/mL (ref 0.350–4.500)
TSH: 4.786 u[IU]/mL — AB (ref 0.350–4.500)

## 2014-12-14 LAB — PROTIME-INR
INR: 1.72 — AB (ref 0.00–1.49)
PROTHROMBIN TIME: 20.3 s — AB (ref 11.6–15.2)

## 2014-12-14 LAB — PREPARE RBC (CROSSMATCH)

## 2014-12-14 LAB — AMMONIA: Ammonia: 38 umol/L — ABNORMAL HIGH (ref 11–32)

## 2014-12-14 LAB — POC OCCULT BLOOD, ED: FECAL OCCULT BLD: POSITIVE — AB

## 2014-12-14 LAB — I-STAT TROPONIN, ED: Troponin i, poc: 0 ng/mL (ref 0.00–0.08)

## 2014-12-14 LAB — SALICYLATE LEVEL

## 2014-12-14 MED ORDER — ONDANSETRON HCL 4 MG/2ML IJ SOLN
4.0000 mg | Freq: Four times a day (QID) | INTRAMUSCULAR | Status: DC | PRN
  Filled 2014-12-14: qty 2

## 2014-12-14 MED ORDER — SODIUM CHLORIDE 0.9 % IJ SOLN
3.0000 mL | Freq: Two times a day (BID) | INTRAMUSCULAR | Status: DC
  Administered 2014-12-15 – 2014-12-19 (×7): 3 mL via INTRAVENOUS

## 2014-12-14 MED ORDER — SODIUM CHLORIDE 0.9 % IV BOLUS (SEPSIS)
1000.0000 mL | Freq: Once | INTRAVENOUS | Status: AC
  Administered 2014-12-14: 1000 mL via INTRAVENOUS

## 2014-12-14 MED ORDER — SODIUM CHLORIDE 0.9 % IV SOLN: INTRAVENOUS | Status: DC

## 2014-12-14 MED ORDER — SODIUM CHLORIDE 0.9 % IV SOLN: Freq: Once | INTRAVENOUS | Status: DC

## 2014-12-14 MED ORDER — SODIUM CHLORIDE 0.9 % IV BOLUS (SEPSIS)
500.0000 mL | Freq: Once | INTRAVENOUS | Status: AC
  Administered 2014-12-14: 500 mL via INTRAVENOUS

## 2014-12-14 MED ORDER — ONDANSETRON HCL 4 MG PO TABS: 4.0000 mg | ORAL_TABLET | Freq: Four times a day (QID) | ORAL | Status: DC | PRN

## 2014-12-14 MED ORDER — PANTOPRAZOLE SODIUM 40 MG IV SOLR
40.0000 mg | Freq: Two times a day (BID) | INTRAVENOUS | Status: DC
  Administered 2014-12-15 (×2): 40 mg via INTRAVENOUS
  Filled 2014-12-14 (×3): qty 40

## 2014-12-14 MED ORDER — SODIUM CHLORIDE 0.9 % IV SOLN
INTRAVENOUS | Status: DC
  Administered 2014-12-15: via INTRAVENOUS

## 2014-12-14 MED ORDER — ACETAMINOPHEN 650 MG RE SUPP: 650.0000 mg | Freq: Four times a day (QID) | RECTAL | Status: DC | PRN

## 2014-12-14 MED ORDER — ACETAMINOPHEN 325 MG PO TABS
650.0000 mg | ORAL_TABLET | Freq: Four times a day (QID) | ORAL | Status: DC | PRN
  Administered 2014-12-18: 650 mg via ORAL
  Filled 2014-12-14: qty 2

## 2014-12-14 NOTE — ED Notes (Signed)
Iv team at bedside  

## 2014-12-14 NOTE — ED Notes (Signed)
Pt still in radiology.

## 2014-12-14 NOTE — ED Notes (Signed)
Attempted to give report 

## 2014-12-14 NOTE — ED Notes (Signed)
Patel, MD at bedside.  

## 2014-12-14 NOTE — H&P (Signed)
[facility] History and Physical  Patient: Lauren Barber  MRN: [id number]  DOB: September 06, 1956  DOS: the patient was seen and examined on 12/14/2014 PCP: Gildardo Cranker, DO  Chief Complaint: Seizure-like episode  HPI: Lauren Barber is a 59 y.o. female with Past medical history of hypertension, morbid obesity, diabetes mellitus with neuropathy, decubitus ulcer, CVA with left-sided chronic weakness, chronic anemia, chronic constipation DVT in June 15. The patient is presenting with complaints of seizure-like episode. Patient mentions that she had 3 seizure-like episode in the last 3 days with multiple episodes today. Episode starts with tremor-like sensation in the leg that spread to her body. First episode started at when she was having a bowel movement and she continued to have multiple episodes today while she was having the bowel movement. She also had an episode of incontinence once. No tongue bite reported. Patient is slightly confused after the episode. No focal deficit which is new for the patient. Patient is unable to specify but denies any recent change in her medication. Patient was diagnosed with DVT in June 15 and was scheduled to go off of anticoagulation in December 15. Patient denies any active bleeding. Patient only complained off for lower back pain.  The patient is coming from SNF. And at her baseline dependent for most of her ADL.  Review of Systems: as mentioned in the history of present illness.  A Comprehensive review of the other systems is negative.  Past Medical History  Diagnosis Date  . Hypertension   . Obesities, morbid   . Vaginal bleeding   . Endometriosis   . Atopic conjunctivitis   . Diabetes mellitus without complication   . Cellulitis     legs  . Insomnia   . Cardiomegaly   . Gout     feet  . Anemia   . Stroke 2013  . CVA (cerebral vascular accident)   . Anxiety   . Headache(784.0)     otc meds prn  . Degenerative, intervertebral  disc     back, legs  . History of blood transfusion [place] - unsure number of units transfused  . Neuromuscular disorder     diabetic neuropathy - feet  . Depressive disorder     depressive disorder   Past Surgical History  Procedure Laterality Date  . Cesarean section  1982, 1989    x 2  . Biopsy endometrial N/A 10 03 2013  . Dilation and curettage of uterus  2013  . Tonsillectomy    . Wisdom tooth extraction    . Hysteroscopy w/d&c N/A 06/28/2014    Procedure: DILATATION AND CURETTAGE /HYSTEROSCOPY;  Surgeon: Lavonia Drafts, MD;  Location: [facility];  Service: Gynecology;  Laterality: N/A;   Social History:  reports that she quit smoking about 2 years ago. Her smoking use included Cigarettes. She has a 21.5 pack-year smoking history. She has never used smokeless tobacco. She reports that she does not drink alcohol or use illicit drugs.  No Known Allergies  No family history on file.  Prior to Admission medications   Medication Sig Start Date End Date Taking? Authorizing Provider  amitriptyline (ELAVIL) 100 MG tablet Take 100 mg by mouth at bedtime.   Yes Historical Provider, MD  apixaban (ELIQUIS) 5 MG TABS tablet Take 5 mg by mouth 2 (two) times daily. For dvt   Yes Historical Provider, MD  FLUoxetine (PROZAC) 20 MG capsule Take 2 capsules (40 mg total) by mouth daily. 12/13/14  Yes Phylis Bougie  Green, NP  furosemide (LASIX) 40 MG tablet Take 40 mg by mouth 2 (two) times daily.    Yes Historical Provider, MD  lisinopril (PRINIVIL,ZESTRIL) 5 MG tablet Take 5 mg by mouth daily. Hold for SBP <100   Yes Historical Provider, MD  allopurinol (ZYLOPRIM) 100 MG tablet Take 200 mg by mouth daily. 05/19/13   Tiffany L Reed, DO  diphenhydrAMINE (BENADRYL) 25 MG tablet Take 25 mg by mouth every 8 (eight) hours as needed for itching. For itching    Historical Provider, MD  EPINEPHrine (EPIPEN) 0.3 mg/0.3 mL DEVI Inject 0.3 mg into the muscle once.    Historical Provider, MD   lactulose (CHRONULAC) 10 GM/15ML solution Take 10 g by mouth 2 (two) times daily. constipation    Historical Provider, MD  levETIRAcetam (KEPPRA) 250 MG tablet Take 1 tablet (250 mg total) by mouth 2 (two) times daily. 12/13/14 12/20/14  Gerlene Fee, NP  levETIRAcetam (KEPPRA) 500 MG tablet Take 1 tablet (500 mg total) by mouth 2 (two) times daily. 11/20/14   Gerlene Fee, NP  Melatonin 3 MG TABS Take 3 mg by mouth at bedtime.    Historical Provider, MD  Multiple Vitamins-Minerals (MULTIVITAMIN WITH MINERALS) tablet Take 1 tablet by mouth daily.    Historical Provider, MD  tiZANidine (ZANAFLEX) 4 MG tablet Take 4 mg by mouth every 8 (eight) hours as needed for muscle spasms.    Historical Provider, MD  traMADol (ULTRAM) 50 MG tablet Take 4 tablets (200 mg total) by mouth every 12 (twelve) hours. For pain 07/20/14   Blanchie Serve, MD  triamcinolone ointment (KENALOG) 0.1 % Apply 1 application topically daily as needed (to legs for dermatitis).    Historical Provider, MD    Physical Exam: Filed Vitals:   12/14/14 2100 12/14/14 2145 12/14/14 2200 12/14/14 2235  BP:  89/48 87/50 95/48   Pulse: 95 96 91 102  Temp:    97.8 F (36.6 C)  TempSrc:    Oral  Resp: 15   15  Height:    5\' 7"  (1.702 m)  Weight:    148.5 kg (327 lb 6.1 oz)  SpO2: 100% 100% 100% 100%    General: Alert, Awake and Oriented to Time, Place and Person. Appear in mild distress Eyes: PERRL ENT: Oral Mucosa clear moist. Neck: Difficult to assess JVD Cardiovascular: S1 and S2 Present, no Murmur, Peripheral Pulses Present Respiratory: Bilateral Air entry equal and Decreased, Clear to Auscultation, noCrackles, no wheezes Abdomen: Bowel Sound present, Soft and non tender Skin: no Rash Extremities: Bilateral Pedal edema, no calf tenderness Neurologic: Chronic left-sided weakness  Labs on Admission:  CBC:  Recent Labs Lab 12/14/14 12/14/14 1815 12/14/14 2212  WBC 10.4 9.1  --   NEUTROABS  --  6.2  --   HGB 7.6* 7.5*  7.1*  HCT 22.8* 22.7* 21.6*  MCV 93.1 91.5  --   PLT 356 339  --     CMP     Component Value Date/Time   NA 133* 12/14/2014 1815   NA 141 07/30/2014   K 4.5 12/14/2014 1815   CL 104 12/14/2014 1815   CO2 16* 12/14/2014 1815   GLUCOSE 95 12/14/2014 1815   BUN 97* 12/14/2014 1815   BUN 29* 07/30/2014   CREATININE 4.74* 12/14/2014 1815   CREATININE 4.15* 12/14/2014 0000   CREATININE 1.3* 07/30/2014   CALCIUM 9.7 12/14/2014 1815   PROT 8.4* 12/14/2014 1815   ALBUMIN 3.4* 12/14/2014 1815   AST 16 12/14/2014  1815   ALT 15 12/14/2014 1815   ALKPHOS 112 12/14/2014 1815   BILITOT 0.4 12/14/2014 1815   GFRNONAA 9* 12/14/2014 1815   GFRAA 11* 12/14/2014 1815    No results for input(s): LIPASE, AMYLASE in the last 168 hours.   Recent Labs Lab 12/14/14 2212  CKTOTAL 175   BNP (last 3 results) No results for input(s): PROBNP in the last 8760 hours.  Radiological Exams on Admission: Dg Chest 2 View  12/14/2014   CLINICAL DATA:  Seizures  EXAM: CHEST  2 VIEW  COMPARISON:  None.  FINDINGS: There is no appreciable edema or consolidation. There is elevation of the left hemidiaphragm. Heart is mildly enlarged with pulmonary vascularity within normal limits. No pneumothorax. No adenopathy.  IMPRESSION: Elevation left hemidiaphragm. No edema or consolidation. Mild cardiac enlargement.   Electronically Signed   By: Lowella Grip M.D.   On: 12/14/2014 19:03   Ct Head Wo Contrast  12/14/2014   CLINICAL DATA:  Seizure  EXAM: CT HEAD WITHOUT CONTRAST  TECHNIQUE: Contiguous axial images were obtained from the base of the skull through the vertex without intravenous contrast.  COMPARISON:  None.  FINDINGS: No evidence of parenchymal hemorrhage or extra-axial fluid collection. No mass lesion, mass effect, or midline shift.  No CT evidence of acute infarction.  Subcortical white matter and periventricular small vessel ischemic changes. Intracranial atherosclerosis.  Cerebral volume is within  normal limits.  No ventriculomegaly.  Mild mucosal thickening in the left maxillary sinus. Mastoid air cells are clear.  No evidence of calvarial fracture.  IMPRESSION: No evidence of acute intracranial abnormality.  Small vessel ischemic changes with intracranial atherosclerosis.   Electronically Signed   By: Julian Hy M.D.   On: 12/14/2014 18:41    EKG: Independently reviewed. normal sinus rhythm, nonspecific ST and T waves changes.  Assessment/Plan Principal Problem:   SIRS (systemic inflammatory response syndrome) Active Problems:   Essential hypertension   Anemia   Chronic pain   Diabetes mellitus type 2, uncontrolled, without complications   Constipation   DVT (deep venous thrombosis)   Diabetic autonomic neuropathy associated with type 2 diabetes mellitus   Acute renal failure   1. SIRS (systemic inflammatory response syndrome) versus sepsis secondary to UTI The patient is presenting with complaints of seizure-like episode. Further workup shows that she is significantly hypotensive in the ER. Episode has been witnessed in the ER. Which she will be talking and will be awake throughout the episode. Possibility of this being a seizure-like episode is less likely and more likely a vasovagal episode when she is having a bowel movement. At present I would obtain an EEG. Along with that due to her persistent hypotension she will be treated with ceftriaxone for possible UTI. Follow cultures. She will be monitored in the stepdown unit.  2. Anemia. Patient's hemoglobin has dropped down significantly. Patient is on Eliquis for anticoagulation. CT scan of the abdomen does not show any evidence of rectal peritoneal hematoma. She has positive Hemoccult but denies any active bleeding. With this the patient is currently receiving 2 units of PRBC. We'll monitor her H&H. Will discuss with GI in morning. Continue Protonix. Holding Eliquis since patient is beyond three-month time  period for anticoagulation after an acute DVT.  3. Acute kidney injury. Etiology currently unclear but most likely prerenal ATN as well as renal. All pending Foley catheter. Monitor ins and outs. Avoid nephrotoxic medication. He was prerenal in morning. Maintaining map above 60.  4. Mood disorder.  Seizure-like episode. CT scan negative for any acute abnormality. Patient does not have any new focal deficit. Check EEG in morning. Holding Elavil as well as Prozac at present.  Advance goals of care discussion: Full code   DVT Prophylaxis: mechanical compression device Nutrition: npo except medication  Disposition: Admitted to inpatient in step-down unit.  Author: Berle Mull, MD [facility] Pager: [phone] 12/14/2014, 11:09 PM    If 7PM-7AM, please contact night-coverage [email or web address] Password [email or web address]  Addendum: Patient's CT of the abdomen also shows evidence of dilated bowel with pseudoobstruction. We will try Dulcolax suppository at present. If does not have any significant benefit may require enema. We will discuss with GI.  Chellsie Gomer 6:04 AM 12/15/2014

## 2014-12-14 NOTE — ED Notes (Signed)
Pt. A difficult stick, paged IV team. 2 RNs looked for site.

## 2014-12-14 NOTE — ED Provider Notes (Signed)
CSN: [id number]     Arrival date & time 12/14/14  1653 History   First MD Initiated Contact with Patient 12/14/14 1654     Chief Complaint  Patient presents with  . Seizures     (Consider location/radiation/quality/duration/timing/severity/associated sxs/prior Treatment) HPI Comments: Patient is a 59 year old female with history of diabetes, peripheral neuropathy, and CVA with left sided hemiparesis. She is currently a resident of [facility]. She was sent here today for evaluation of shaking like activity that there is concern may be seizures. She states that each time she has tried to go to the bathroom for the past week she develops a shaking in her feet that then moves up into her upper body and results in a generalized shaking. She denies to me that she experiences any loss of consciousness or headache. She denies any post ictal symptoms such as somnolence or confusion. She was recently started on Keppra for presumed seizure activity. She denies any headache, difficulty breathing, chest pain.  Patient is a 59 y.o. female presenting with seizures. The history is provided by the patient.  Seizures Seizure activity on arrival: no   Seizure type:  Myoclonic Preceding symptoms: no sensation of an aura present and no dizziness   Initial focality:  None Episode characteristics: generalized shaking   Episode characteristics: no combativeness, no confusion and no disorientation   Postictal symptoms: no confusion and no memory loss   Return to baseline: yes   Severity:  Moderate Timing:  Intermittent   Past Medical History  Diagnosis Date  . Hypertension   . Obesities, morbid   . Vaginal bleeding   . Endometriosis   . Atopic conjunctivitis   . Diabetes mellitus without complication   . Cellulitis     legs  . Insomnia   . Cardiomegaly   . Gout     feet  . Anemia   . Stroke 2013  . CVA (cerebral vascular accident)   . Anxiety   . Headache(784.0)     otc meds prn  .  Degenerative, intervertebral disc     back, legs  . History of blood transfusion [place] - unsure number of units transfused  . Neuromuscular disorder     diabetic neuropathy - feet  . Depressive disorder     depressive disorder   Past Surgical History  Procedure Laterality Date  . Cesarean section  1982, 1989    x 2  . Biopsy endometrial N/A 10 03 2013  . Dilation and curettage of uterus  2013  . Tonsillectomy    . Wisdom tooth extraction    . Hysteroscopy w/d&c N/A 06/28/2014    Procedure: DILATATION AND CURETTAGE /HYSTEROSCOPY;  Surgeon: Lavonia Drafts, MD;  Location: [facility];  Service: Gynecology;  Laterality: N/A;   No family history on file. History  Substance Use Topics  . Smoking status: Former Smoker -- 0.50 packs/day for 43 years    Types: Cigarettes    Quit date: 06/11/2012  . Smokeless tobacco: Never Used  . Alcohol Use: No   OB History    Gravida Para Term Preterm AB TAB SAB Ectopic Multiple Living   2 2 2  0 0 0 0 0 0 2     Review of Systems  Neurological: Positive for seizures.  All other systems reviewed and are negative.     Allergies  Review of patient's allergies indicates no known allergies.  Home Medications   Prior to Admission medications   Medication Sig Start  Date End Date Taking? Authorizing Provider  allopurinol (ZYLOPRIM) 100 MG tablet Take 200 mg by mouth daily. 05/19/13   Tiffany L Reed, DO  amitriptyline (ELAVIL) 100 MG tablet Take 100 mg by mouth at bedtime.    Historical Provider, MD  apixaban (ELIQUIS) 5 MG TABS tablet Take 5 mg by mouth 2 (two) times daily. For dvt    Historical Provider, MD  diphenhydrAMINE (BENADRYL) 25 MG tablet Take 25 mg by mouth every 8 (eight) hours as needed for itching. For itching    Historical Provider, MD  EPINEPHrine (EPIPEN) 0.3 mg/0.3 mL DEVI Inject 0.3 mg into the muscle once.    Historical Provider, MD  FLUoxetine (PROZAC) 20 MG capsule Take 2 capsules (40 mg total) by mouth daily.  12/13/14   Gerlene Fee, NP  furosemide (LASIX) 40 MG tablet Take 40 mg by mouth 2 (two) times daily.     Historical Provider, MD  lactulose (CHRONULAC) 10 GM/15ML solution Take 10 g by mouth 2 (two) times daily. constipation    Historical Provider, MD  levETIRAcetam (KEPPRA) 250 MG tablet Take 1 tablet (250 mg total) by mouth 2 (two) times daily. 12/13/14 12/20/14  Gerlene Fee, NP  levETIRAcetam (KEPPRA) 500 MG tablet Take 1 tablet (500 mg total) by mouth 2 (two) times daily. 11/20/14   Gerlene Fee, NP  lisinopril (PRINIVIL,ZESTRIL) 5 MG tablet Take 5 mg by mouth daily. Hold for SBP <100    Historical Provider, MD  Melatonin 3 MG TABS Take 3 mg by mouth at bedtime.    Historical Provider, MD  Multiple Vitamins-Minerals (MULTIVITAMIN WITH MINERALS) tablet Take 1 tablet by mouth daily.    Historical Provider, MD  tiZANidine (ZANAFLEX) 4 MG tablet Take 4 mg by mouth every 8 (eight) hours as needed for muscle spasms.    Historical Provider, MD  traMADol (ULTRAM) 50 MG tablet Take 4 tablets (200 mg total) by mouth every 12 (twelve) hours. For pain 07/20/14   Blanchie Serve, MD  triamcinolone ointment (KENALOG) 0.1 % Apply 1 application topically daily as needed (to legs for dermatitis).    Historical Provider, MD   BP 80/50 mmHg  Pulse 94  Temp(Src) 98.1 F (36.7 C) (Oral)  Resp 20  SpO2 100% Physical Exam  Constitutional: She is oriented to person, place, and time. She appears well-developed and well-nourished. No distress.  HENT:  Head: Normocephalic and atraumatic.  Mouth/Throat: Oropharynx is clear and moist.  Eyes: EOM are normal. Pupils are equal, round, and reactive to light.  Neck: Normal range of motion. Neck supple.  Cardiovascular: Normal rate and regular rhythm.  Exam reveals no gallop and no friction rub.   No murmur heard. Pulmonary/Chest: Effort normal and breath sounds normal. No respiratory distress. She has no wheezes.  Abdominal: Soft. Bowel sounds are normal. She  exhibits no distension. There is no tenderness.  Musculoskeletal: Normal range of motion. She exhibits edema.  Neurological: She is alert and oriented to person, place, and time. No cranial nerve deficit.  There is a left-sided hemiparesis noted. She has muscle atrophy in both lower extremities along with 1+ edema.  Skin: Skin is warm and dry. She is not diaphoretic.  Nursing note and vitals reviewed.   ED Course  Procedures (including critical care time) Labs Review Labs Reviewed - No data to display  Imaging Review No results found.   Date: 12/14/2014  Rate: 94  Rhythm: normal sinus rhythm  QRS Axis: normal  Intervals: PR prolonged  ST/T  Wave abnormalities: normal  Conduction Disutrbances:none  Narrative Interpretation:   Old EKG Reviewed: unchanged    MDM   Final diagnoses:  None    Patient presents here with complaints of shaking that occurs when she attempts to go to the bathroom. Her reported symptoms sounded seizure-like, and a workup was initiated to look into this. Her head CT is unremarkable, however laboratory studies reveal acute renal failure with when necessary of 97 and creatinine of 4.7. This is markedly elevated above her baseline. She was initially hypotensive and for both of these reasons was given IV fluids. Her blood pressures did improve somewhat with this. Her hemoglobin is decreased at 7.5 and she is found to have heme positive stools. She may well have a GI bleed and I believe warrants further evaluation for this possibility. I've spoken with Dr. Posey Pronto from the hospitalist service who agrees to admit.    Veryl Speak, MD 12/14/14 2116

## 2014-12-14 NOTE — ED Notes (Signed)
MD at bedside. 

## 2014-12-14 NOTE — ED Notes (Addendum)
IV team unable to obtain labs on pt. Phlebotomy notified

## 2014-12-14 NOTE — ED Notes (Signed)
Pt brought in via EMS complaining of having seizures yesterday and today. [facility] started pt on Keppra yesterday. Per patient- seizures are precipitated by "touching my backside, to turn me".

## 2014-12-15 ENCOUNTER — Inpatient Hospital Stay (HOSPITAL_COMMUNITY): Payer: Medicare Other

## 2014-12-15 ENCOUNTER — Encounter (HOSPITAL_COMMUNITY): Payer: Self-pay | Admitting: Radiology

## 2014-12-15 DIAGNOSIS — R569 Unspecified convulsions: Secondary | ICD-10-CM

## 2014-12-15 LAB — COMPREHENSIVE METABOLIC PANEL
ALBUMIN: 3 g/dL — AB (ref 3.5–5.2)
ALT: 13 U/L (ref 0–35)
AST: 14 U/L (ref 0–37)
Alkaline Phosphatase: 98 U/L (ref 39–117)
Anion gap: 13 (ref 5–15)
BUN: 95 mg/dL — ABNORMAL HIGH (ref 6–23)
CHLORIDE: 105 mmol/L (ref 96–112)
CO2: 15 mmol/L — ABNORMAL LOW (ref 19–32)
CREATININE: 4.81 mg/dL — AB (ref 0.50–1.10)
Calcium: 9.2 mg/dL (ref 8.4–10.5)
GFR calc Af Amer: 11 mL/min — ABNORMAL LOW (ref >90)
GFR, EST NON AFRICAN AMERICAN: 9 mL/min — AB (ref >90)
Glucose, Bld: 81 mg/dL (ref 70–99)
POTASSIUM: 4.2 mmol/L (ref 3.5–5.1)
Sodium: 133 mmol/L — ABNORMAL LOW (ref 135–145)
TOTAL PROTEIN: 7 g/dL (ref 6.0–8.3)
Total Bilirubin: 0.6 mg/dL (ref 0.3–1.2)

## 2014-12-15 LAB — CBC
HCT: 25.7 % — ABNORMAL LOW (ref 36.0–46.0)
Hemoglobin: 8.4 g/dL — ABNORMAL LOW (ref 12.0–15.0)
MCH: 29.2 pg (ref 26.0–34.0)
MCHC: 32.7 g/dL (ref 30.0–36.0)
MCV: 89.2 fL (ref 78.0–100.0)
Platelets: 298 10*3/uL (ref 150–400)
RBC: 2.88 MIL/uL — AB (ref 3.87–5.11)
RDW: 15.1 % (ref 11.5–15.5)
WBC: 8.9 10*3/uL (ref 4.0–10.5)

## 2014-12-15 LAB — SODIUM, URINE, RANDOM: Sodium, Ur: 63 mmol/L

## 2014-12-15 LAB — CBC WITH DIFFERENTIAL/PLATELET
Basophils Absolute: 0 10*3/uL (ref 0.0–0.1)
Basophils Relative: 0 % (ref 0–1)
Eosinophils Absolute: 0.2 10*3/uL (ref 0.0–0.7)
Eosinophils Relative: 2 % (ref 0–5)
HCT: 23 % — ABNORMAL LOW (ref 36.0–46.0)
Hemoglobin: 7.5 g/dL — ABNORMAL LOW (ref 12.0–15.0)
LYMPHS ABS: 2.3 10*3/uL (ref 0.7–4.0)
LYMPHS PCT: 25 % (ref 12–46)
MCH: 29.5 pg (ref 26.0–34.0)
MCHC: 32.6 g/dL (ref 30.0–36.0)
MCV: 90.6 fL (ref 78.0–100.0)
MONOS PCT: 4 % (ref 3–12)
Monocytes Absolute: 0.4 10*3/uL (ref 0.1–1.0)
Neutro Abs: 6.5 10*3/uL (ref 1.7–7.7)
Neutrophils Relative %: 69 % (ref 43–77)
Platelets: 299 10*3/uL (ref 150–400)
RBC: 2.54 MIL/uL — AB (ref 3.87–5.11)
RDW: 15 % (ref 11.5–15.5)
WBC: 9.5 10*3/uL (ref 4.0–10.5)

## 2014-12-15 LAB — URINALYSIS, ROUTINE W REFLEX MICROSCOPIC
BILIRUBIN URINE: NEGATIVE
Glucose, UA: NEGATIVE mg/dL
Ketones, ur: NEGATIVE mg/dL
Nitrite: NEGATIVE
Protein, ur: NEGATIVE mg/dL
Specific Gravity, Urine: 1.008 (ref 1.005–1.030)
Urobilinogen, UA: 0.2 mg/dL (ref 0.0–1.0)
pH: 5.5 (ref 5.0–8.0)

## 2014-12-15 LAB — URINE MICROSCOPIC-ADD ON

## 2014-12-15 LAB — IRON AND TIBC
Iron: 38 ug/dL — ABNORMAL LOW (ref 42–145)
Saturation Ratios: 25 % (ref 20–55)
TIBC: 154 ug/dL — ABNORMAL LOW (ref 250–470)
UIBC: 116 ug/dL — ABNORMAL LOW (ref 125–400)

## 2014-12-15 LAB — GLUCOSE, CAPILLARY: GLUCOSE-CAPILLARY: 72 mg/dL (ref 70–99)

## 2014-12-15 LAB — LACTATE DEHYDROGENASE: LDH: 124 U/L (ref 94–250)

## 2014-12-15 LAB — CREATININE, URINE, RANDOM: CREATININE, URINE: 51.57 mg/dL

## 2014-12-15 LAB — URIC ACID: Uric Acid, Serum: 5.3 mg/dL (ref 2.4–7.0)

## 2014-12-15 LAB — ABO/RH: ABO/RH(D): O POS

## 2014-12-15 LAB — PROTIME-INR
INR: 1.73 — ABNORMAL HIGH (ref 0.00–1.49)
Prothrombin Time: 20.4 seconds — ABNORMAL HIGH (ref 11.6–15.2)

## 2014-12-15 LAB — MRSA PCR SCREENING: MRSA by PCR: POSITIVE — AB

## 2014-12-15 MED ORDER — CEFTRIAXONE SODIUM IN DEXTROSE 20 MG/ML IV SOLN
1.0000 g | Freq: Every day | INTRAVENOUS | Status: DC
  Administered 2014-12-15 – 2014-12-19 (×5): 1 g via INTRAVENOUS
  Filled 2014-12-15 (×5): qty 50

## 2014-12-15 MED ORDER — ALLOPURINOL 100 MG PO TABS
100.0000 mg | ORAL_TABLET | Freq: Every day | ORAL | Status: DC
Start: 2014-12-16 — End: 2014-12-19
  Administered 2014-12-16 – 2014-12-19 (×4): 100 mg via ORAL
  Filled 2014-12-15 (×4): qty 1

## 2014-12-15 MED ORDER — DIPHENHYDRAMINE HCL 25 MG PO TABS
25.0000 mg | ORAL_TABLET | Freq: Three times a day (TID) | ORAL | Status: DC | PRN
  Filled 2014-12-15: qty 1

## 2014-12-15 MED ORDER — ALLOPURINOL 100 MG PO TABS
200.0000 mg | ORAL_TABLET | Freq: Every day | ORAL | Status: DC
  Administered 2014-12-15: 200 mg via ORAL
  Filled 2014-12-15: qty 2

## 2014-12-15 MED ORDER — PANTOPRAZOLE SODIUM 40 MG PO TBEC
40.0000 mg | DELAYED_RELEASE_TABLET | Freq: Every day | ORAL | Status: DC
  Administered 2014-12-16 – 2014-12-19 (×3): 40 mg via ORAL
  Filled 2014-12-15 (×3): qty 1

## 2014-12-15 MED ORDER — LEVETIRACETAM 500 MG PO TABS
500.0000 mg | ORAL_TABLET | Freq: Two times a day (BID) | ORAL | Status: DC
  Administered 2014-12-15: 500 mg via ORAL
  Filled 2014-12-15 (×2): qty 1

## 2014-12-15 MED ORDER — PNEUMOCOCCAL VAC POLYVALENT 25 MCG/0.5ML IJ INJ
0.5000 mL | INJECTION | INTRAMUSCULAR | Status: AC
  Administered 2014-12-16: 0.5 mL via INTRAMUSCULAR
  Filled 2014-12-15: qty 0.5

## 2014-12-15 MED ORDER — LEVETIRACETAM 250 MG PO TABS: 250.0000 mg | ORAL_TABLET | Freq: Two times a day (BID) | ORAL | Status: DC

## 2014-12-15 MED ORDER — BISACODYL 10 MG RE SUPP
10.0000 mg | Freq: Once | RECTAL | Status: AC
  Administered 2014-12-15: 10 mg via RECTAL
  Filled 2014-12-15: qty 1

## 2014-12-15 MED ORDER — AMITRIPTYLINE HCL 100 MG PO TABS
100.0000 mg | ORAL_TABLET | Freq: Every day | ORAL | Status: DC
  Administered 2014-12-15 – 2014-12-18 (×4): 100 mg via ORAL
  Filled 2014-12-15 (×6): qty 1

## 2014-12-15 MED ORDER — BISACODYL 5 MG PO TBEC
5.0000 mg | DELAYED_RELEASE_TABLET | Freq: Three times a day (TID) | ORAL | Status: AC
  Administered 2014-12-15 (×2): 5 mg via ORAL
  Filled 2014-12-15 (×2): qty 1

## 2014-12-15 MED ORDER — STERILE WATER FOR INJECTION IV SOLN
INTRAVENOUS | Status: DC
  Administered 2014-12-15: 15:00:00 via INTRAVENOUS
  Filled 2014-12-15 (×5): qty 850

## 2014-12-15 MED ORDER — MUPIROCIN 2 % EX OINT
1.0000 "application " | TOPICAL_OINTMENT | Freq: Two times a day (BID) | CUTANEOUS | Status: DC
  Administered 2014-12-15 – 2014-12-19 (×9): 1 via NASAL
  Filled 2014-12-15 (×4): qty 22

## 2014-12-15 MED ORDER — POLYETHYLENE GLYCOL 3350 17 G PO PACK
17.0000 g | PACK | ORAL | Status: AC
  Administered 2014-12-15 – 2014-12-16 (×4): 17 g via ORAL
  Filled 2014-12-15 (×5): qty 1

## 2014-12-15 MED ORDER — SODIUM CHLORIDE 0.9 % IV SOLN: Freq: Once | INTRAVENOUS | Status: DC

## 2014-12-15 MED ORDER — FLUOXETINE HCL 20 MG PO CAPS
40.0000 mg | ORAL_CAPSULE | Freq: Every day | ORAL | Status: DC
  Administered 2014-12-15 – 2014-12-19 (×5): 40 mg via ORAL
  Filled 2014-12-15 (×5): qty 2

## 2014-12-15 MED ORDER — SODIUM CHLORIDE 0.9 % IV BOLUS (SEPSIS): 500.0000 mL | Freq: Once | INTRAVENOUS | Status: DC

## 2014-12-15 MED ORDER — TIZANIDINE HCL 4 MG PO TABS
4.0000 mg | ORAL_TABLET | Freq: Three times a day (TID) | ORAL | Status: DC | PRN
  Filled 2014-12-15: qty 1

## 2014-12-15 MED ORDER — SODIUM CHLORIDE 0.9 % IV SOLN: INTRAVENOUS | Status: DC

## 2014-12-15 MED ORDER — LACTULOSE 10 GM/15ML PO SOLN
10.0000 g | Freq: Two times a day (BID) | ORAL | Status: DC
  Administered 2014-12-15 – 2014-12-19 (×9): 10 g via ORAL
  Filled 2014-12-15 (×11): qty 15

## 2014-12-15 MED ORDER — VANCOMYCIN HCL 10 G IV SOLR
2500.0000 mg | Freq: Once | INTRAVENOUS | Status: AC
  Administered 2014-12-15: 2500 mg via INTRAVENOUS
  Filled 2014-12-15: qty 2500

## 2014-12-15 MED ORDER — CHLORHEXIDINE GLUCONATE CLOTH 2 % EX PADS
6.0000 | MEDICATED_PAD | Freq: Every day | CUTANEOUS | Status: AC
  Administered 2014-12-15 – 2014-12-19 (×5): 6 via TOPICAL

## 2014-12-15 NOTE — Consult Note (Signed)
Reason for Consult:AKI Referring Physician: Dr. Aurora Mask is an 59 y.o. female.  HPI: 59 yr female with hx of multiple med prob including HTN since age 42, DM for 1-2 yr, anemia, morbid obesity, CVA with residual L hemiparesis 2013, DVT 04/2014, Diabetic Neuropathy, Depression, Decubiti, immobility, anxiety, Gout admitted with episodes on tremulous legs assoc with BM.  She is incontinent of urine and has her BM in bed.  Has baseline Cr 1.4-1.8. Outpatient meds include lisinopril and Lasix.  BP 80s-90s on arrival and Hb low.  Has some pyruria with culture pending.   No known hx renal dz, or FH of renal dz, inherited eye, hearing or ms skel defects.  Son with DM, S/P amp.  She denies UTIs or stones or hematuria.  Cr last in 2013 as above. Cr on admit 4.1 and now 4.8.  Had large drop Hb assoc with low bp and has been on Eliquis.Signif HTN during preg. Constitutional: as above, is bedbound, nonmobile Eyes: positive for prob with refrx Ears, nose, mouth, throat, and face: negative Respiratory: chronic cough with occ hemoptysis Cardiovascular: edema of DVT R leg, and some on L CVA leg Gastrointestinal: see GI note, no indigestion, HB but constip, no hx Hep Genitourinary:feel when passes urine Integument/breast: dry skin bumps Hematologic/lymphatic: chronic anemia Musculoskeletal:arthrits R hand,  Hx gout Neurological: L hemiparesis Behavioral/Psych: anxiety Endocrine: positive for DM and neuropathy Allergic/Immunologic: negative   Past Medical History  Diagnosis Date  . Hypertension   . Obesities, morbid   . Vaginal bleeding   . Endometriosis   . Atopic conjunctivitis   . Diabetes mellitus without complication   . Cellulitis     legs  . Insomnia   . Cardiomegaly   . Gout     feet  . Anemia   . Stroke 2013  . CVA (cerebral vascular accident)   . Anxiety   . Headache(784.0)     otc meds prn  . Degenerative, intervertebral disc     back, legs  . History of blood  transfusion [facility] - unsure number of units transfused  . Neuromuscular disorder     diabetic neuropathy - feet  . Depressive disorder     depressive disorder    Past Surgical History  Procedure Laterality Date  . Cesarean section  1982, 1989    x 2  . Biopsy endometrial N/A 10 03 2013  . Dilation and curettage of uterus  2013  . Tonsillectomy    . Wisdom tooth extraction    . Hysteroscopy w/d&c N/A 06/28/2014    Procedure: DILATATION AND CURETTAGE /HYSTEROSCOPY;  Surgeon: Lavonia Drafts, MD;  Location: [facility];  Service: Gynecology;  Laterality: N/A;    Family History  Problem Relation Age of Onset  . Hypertension Mother   . Hypertension Father     Social History:  reports that she quit smoking about 2 years ago. Her smoking use included Cigarettes. She has a 21.5 pack-year smoking history. She has never used smokeless tobacco. She reports that she does not drink alcohol or use illicit drugs.  Allergies: No Known Allergies  Medications:  I have reviewed the patient's current medications. Prior to Admission:  Prescriptions prior to admission  Medication Sig Dispense Refill Last Dose  . amitriptyline (ELAVIL) 100 MG tablet Take 100 mg by mouth at bedtime.   Taking  . apixaban (ELIQUIS) 5 MG TABS tablet Take 5 mg by mouth 2 (two) times daily. For dvt   Taking  .  FLUoxetine (PROZAC) 20 MG capsule Take 2 capsules (40 mg total) by mouth daily. 120 capsule 11   . furosemide (LASIX) 40 MG tablet Take 40 mg by mouth 2 (two) times daily.    Taking  . lisinopril (PRINIVIL,ZESTRIL) 5 MG tablet Take 5 mg by mouth daily. Hold for SBP <100   Taking  . allopurinol (ZYLOPRIM) 100 MG tablet Take 200 mg by mouth daily.   Taking  . diphenhydrAMINE (BENADRYL) 25 MG tablet Take 25 mg by mouth every 8 (eight) hours as needed for itching. For itching   Taking  . EPINEPHrine (EPIPEN) 0.3 mg/0.3 mL DEVI Inject 0.3 mg into the muscle once.   Taking  . lactulose (CHRONULAC) 10  GM/15ML solution Take 10 g by mouth 2 (two) times daily. constipation   Taking  . levETIRAcetam (KEPPRA) 250 MG tablet Take 1 tablet (250 mg total) by mouth 2 (two) times daily. 14 tablet 0   . levETIRAcetam (KEPPRA) 500 MG tablet Take 1 tablet (500 mg total) by mouth 2 (two) times daily. 120 tablet 11   . Melatonin 3 MG TABS Take 3 mg by mouth at bedtime.   Taking  . Multiple Vitamins-Minerals (MULTIVITAMIN WITH MINERALS) tablet Take 1 tablet by mouth daily.   Taking  . tiZANidine (ZANAFLEX) 4 MG tablet Take 4 mg by mouth every 8 (eight) hours as needed for muscle spasms.   Taking  . traMADol (ULTRAM) 50 MG tablet Take 4 tablets (200 mg total) by mouth every 12 (twelve) hours. For pain 240 tablet 5 Taking  . triamcinolone ointment (KENALOG) 0.1 % Apply 1 application topically daily as needed (to legs for dermatitis).   Taking    Results for orders placed or performed during the hospital encounter of 12/14/14 (from the past 48 hour(s))  Comprehensive metabolic panel     Status: Abnormal   Collection Time: 12/14/14  6:15 PM  Result Value Ref Range   Sodium 133 (L) 135 - 145 mmol/L   Potassium 4.5 3.5 - 5.1 mmol/L   Chloride 104 96 - 112 mmol/L   CO2 16 (L) 19 - 32 mmol/L   Glucose, Bld 95 70 - 99 mg/dL   BUN 97 (H) 6 - 23 mg/dL   Creatinine, Ser 4.20 (H) 0.50 - 1.10 mg/dL   Calcium 9.7 8.4 - 91.1 mg/dL   Total Protein 8.4 (H) 6.0 - 8.3 g/dL   Albumin 3.4 (L) 3.5 - 5.2 g/dL   AST 16 0 - 37 U/L   ALT 15 0 - 35 U/L   Alkaline Phosphatase 112 39 - 117 U/L   Total Bilirubin 0.4 0.3 - 1.2 mg/dL   GFR calc non Af Amer 9 (L) >90 mL/min   GFR calc Af Amer 11 (L) >90 mL/min    Comment: (NOTE) The eGFR has been calculated using the CKD EPI equation. This calculation has not been validated in all clinical situations. eGFR's persistently <90 mL/min signify possible Chronic Kidney Disease.    Anion gap 13 5 - 15  CBC with Differential     Status: Abnormal   Collection Time: 12/14/14  6:15 PM   Result Value Ref Range   WBC 9.1 4.0 - 10.5 K/uL   RBC 2.48 (L) 3.87 - 5.11 MIL/uL   Hemoglobin 7.5 (L) 12.0 - 15.0 g/dL   HCT 53.5 (L) 14.8 - 25.9 %   MCV 91.5 78.0 - 100.0 fL   MCH 30.2 26.0 - 34.0 pg   MCHC 33.0 30.0 - 36.0 g/dL  RDW 15.0 11.5 - 15.5 %   Platelets 339 150 - 400 K/uL   Neutrophils Relative % 68 43 - 77 %   Neutro Abs 6.2 1.7 - 7.7 K/uL   Lymphocytes Relative 26 12 - 46 %   Lymphs Abs 2.3 0.7 - 4.0 K/uL   Monocytes Relative 4 3 - 12 %   Monocytes Absolute 0.4 0.1 - 1.0 K/uL   Eosinophils Relative 2 0 - 5 %   Eosinophils Absolute 0.2 0.0 - 0.7 K/uL   Basophils Relative 0 0 - 1 %   Basophils Absolute 0.0 0.0 - 0.1 K/uL  I-stat troponin, ED     Status: None   Collection Time: 12/14/14  6:25 PM  Result Value Ref Range   Troponin i, poc 0.00 0.00 - 0.08 ng/mL   Comment 3            Comment: Due to the release kinetics of cTnI, a negative result within the first hours of the onset of symptoms does not rule out myocardial infarction with certainty. If myocardial infarction is still suspected, repeat the test at appropriate intervals.   POC occult blood, ED Provider will collect     Status: Abnormal   Collection Time: 12/14/14  9:02 PM  Result Value Ref Range   Fecal Occult Bld POSITIVE (A) NEGATIVE  Lactic acid, plasma     Status: None   Collection Time: 12/14/14  9:31 PM  Result Value Ref Range   Lactic Acid, Venous 1.2 0.5 - 2.0 mmol/L    Comment: Please note change in reference range.  Urinalysis, Routine w reflex microscopic     Status: Abnormal   Collection Time: 12/14/14  9:31 PM  Result Value Ref Range   Color, Urine YELLOW YELLOW   APPearance TURBID (A) CLEAR   Specific Gravity, Urine 1.018 1.005 - 1.030   pH 7.0 5.0 - 8.0   Glucose, UA NEGATIVE NEGATIVE mg/dL   Hgb urine dipstick NEGATIVE NEGATIVE   Bilirubin Urine SMALL (A) NEGATIVE   Ketones, ur 15 (A) NEGATIVE mg/dL   Protein, ur NEGATIVE NEGATIVE mg/dL   Urobilinogen, UA 1.0 0.0 - 1.0  mg/dL   Nitrite NEGATIVE NEGATIVE   Leukocytes, UA LARGE (A) NEGATIVE  Urine rapid drug screen (hosp performed)     Status: None   Collection Time: 12/14/14  9:31 PM  Result Value Ref Range   Opiates NONE DETECTED NONE DETECTED   Cocaine NONE DETECTED NONE DETECTED   Benzodiazepines NONE DETECTED NONE DETECTED   Amphetamines NONE DETECTED NONE DETECTED   Tetrahydrocannabinol NONE DETECTED NONE DETECTED   Barbiturates NONE DETECTED NONE DETECTED    Comment:        DRUG SCREEN FOR MEDICAL PURPOSES ONLY.  IF CONFIRMATION IS NEEDED FOR ANY PURPOSE, NOTIFY LAB WITHIN 5 DAYS.        LOWEST DETECTABLE LIMITS FOR URINE DRUG SCREEN Drug Class       Cutoff (ng/mL) Amphetamine      1000 Barbiturate      200 Benzodiazepine   993 Tricyclics       716 Opiates          300 Cocaine          300 THC              50   Urine microscopic-add on     Status: Abnormal   Collection Time: 12/14/14  9:31 PM  Result Value Ref Range   Squamous Epithelial / LPF RARE RARE  WBC, UA 7-10 <3 WBC/hpf   RBC / HPF 0-2 <3 RBC/hpf   Bacteria, UA MANY (A) RARE   Casts BACTERIAL CASTS (A) NEGATIVE  Type and screen     Status: None (Preliminary result)   Collection Time: 12/14/14 10:08 PM  Result Value Ref Range   ABO/RH(D) O POS    Antibody Screen NEG    Sample Expiration 12/17/2014    Unit Number [id number]    Blood Component Type RED CELLS,LR    Unit division 00    Status of Unit ISSUED    Transfusion Status OK TO TRANSFUSE    Crossmatch Result Compatible    Unit Number [id number]    Blood Component Type RED CELLS,LR    Unit division 00    Status of Unit ISSUED    Transfusion Status OK TO TRANSFUSE    Crossmatch Result Compatible   ABO/Rh     Status: None   Collection Time: 12/14/14 10:08 PM  Result Value Ref Range   ABO/RH(D) O POS   Ammonia     Status: Abnormal   Collection Time: 12/14/14 10:12 PM  Result Value Ref Range   Ammonia 38 (H) 11 - 32 umol/L  CK     Status: None    Collection Time: 12/14/14 10:12 PM  Result Value Ref Range   Total CK 175 7 - 177 U/L  Hemoglobin and hematocrit, blood     Status: Abnormal   Collection Time: 12/14/14 10:12 PM  Result Value Ref Range   Hemoglobin 7.1 (L) 12.0 - 15.0 g/dL   HCT 21.6 (L) 36.0 - 46.0 %  Lactate dehydrogenase     Status: None   Collection Time: 12/14/14 10:12 PM  Result Value Ref Range   LDH 127 94 - 250 U/L  Protime-INR     Status: Abnormal   Collection Time: 12/14/14 10:12 PM  Result Value Ref Range   Prothrombin Time 20.3 (H) 11.6 - 15.2 seconds   INR 1.72 (H) 0.00 - 1.49  Reticulocytes     Status: Abnormal   Collection Time: 12/14/14 10:12 PM  Result Value Ref Range   Retic Ct Pct 2.4 0.4 - 3.1 %   RBC. 2.36 (L) 3.87 - 5.11 MIL/uL   Retic Count, Manual 56.6 19.0 - 203.5 K/uL  Salicylate level     Status: None   Collection Time: 12/14/14 10:12 PM  Result Value Ref Range   Salicylate Lvl <5.9 2.8 - 20.0 mg/dL  TSH     Status: None   Collection Time: 12/14/14 10:12 PM  Result Value Ref Range   TSH 3.898 0.350 - 4.500 uIU/mL  Prepare RBC     Status: None   Collection Time: 12/14/14 10:30 PM  Result Value Ref Range   Order Confirmation ORDER PROCESSED BY BLOOD BANK   MRSA PCR Screening     Status: Abnormal   Collection Time: 12/15/14  1:28 AM  Result Value Ref Range   MRSA by PCR POSITIVE (A) NEGATIVE    Comment:        The GeneXpert MRSA Assay (FDA approved for NASAL specimens only), is one component of a comprehensive MRSA colonization surveillance program. It is not intended to diagnose MRSA infection nor to guide or monitor treatment for MRSA infections. RESULT CALLED TO, READ BACK BY AND VERIFIED WITH: USSERY,K RN [phone] 12/15/14 MITCHELL,L   CBC with Differential/Platelet     Status: Abnormal   Collection Time: 12/15/14  7:58 AM  Result Value Ref Range  WBC 9.5 4.0 - 10.5 K/uL   RBC 2.54 (L) 3.87 - 5.11 MIL/uL   Hemoglobin 7.5 (L) 12.0 - 15.0 g/dL   HCT 80.1 (L) 32.7 - 20.5  %   MCV 90.6 78.0 - 100.0 fL   MCH 29.5 26.0 - 34.0 pg   MCHC 32.6 30.0 - 36.0 g/dL   RDW 08.4 61.7 - 82.0 %   Platelets 299 150 - 400 K/uL   Neutrophils Relative % 69 43 - 77 %   Neutro Abs 6.5 1.7 - 7.7 K/uL   Lymphocytes Relative 25 12 - 46 %   Lymphs Abs 2.3 0.7 - 4.0 K/uL   Monocytes Relative 4 3 - 12 %   Monocytes Absolute 0.4 0.1 - 1.0 K/uL   Eosinophils Relative 2 0 - 5 %   Eosinophils Absolute 0.2 0.0 - 0.7 K/uL   Basophils Relative 0 0 - 1 %   Basophils Absolute 0.0 0.0 - 0.1 K/uL  Comprehensive metabolic panel     Status: Abnormal   Collection Time: 12/15/14  7:58 AM  Result Value Ref Range   Sodium 133 (L) 135 - 145 mmol/L   Potassium 4.2 3.5 - 5.1 mmol/L   Chloride 105 96 - 112 mmol/L   CO2 15 (L) 19 - 32 mmol/L   Glucose, Bld 81 70 - 99 mg/dL   BUN 95 (H) 6 - 23 mg/dL   Creatinine, Ser 9.38 (H) 0.50 - 1.10 mg/dL   Calcium 9.2 8.4 - 86.3 mg/dL   Total Protein 7.0 6.0 - 8.3 g/dL   Albumin 3.0 (L) 3.5 - 5.2 g/dL   AST 14 0 - 37 U/L   ALT 13 0 - 35 U/L   Alkaline Phosphatase 98 39 - 117 U/L   Total Bilirubin 0.6 0.3 - 1.2 mg/dL   GFR calc non Af Amer 9 (L) >90 mL/min   GFR calc Af Amer 11 (L) >90 mL/min    Comment: (NOTE) The eGFR has been calculated using the CKD EPI equation. This calculation has not been validated in all clinical situations. eGFR's persistently <90 mL/min signify possible Chronic Kidney Disease.    Anion gap 13 5 - 15  Protime-INR     Status: Abnormal   Collection Time: 12/15/14  7:58 AM  Result Value Ref Range   Prothrombin Time 20.4 (H) 11.6 - 15.2 seconds   INR 1.73 (H) 0.00 - 1.49  Glucose, capillary     Status: None   Collection Time: 12/15/14  9:11 AM  Result Value Ref Range   Glucose-Capillary 72 70 - 99 mg/dL   Comment 1 Notify RN    Comment 2 Documented in Chart     Ct Abdomen Pelvis Wo Contrast  12/15/2014   CLINICAL DATA:  Evaluate for bleed. Anemia and low back pain. Long-term anticoagulant use.  EXAM: CT ABDOMEN AND  PELVIS WITHOUT CONTRAST  TECHNIQUE: Multidetector CT imaging of the abdomen and pelvis was performed following the standard protocol without IV contrast.  COMPARISON:  10/24/2013  FINDINGS: Slight fibrosis in the lung bases. Prominent density demonstrated in the subcarinal region may represent mass or lymphadenopathy or could represent enlarged pulmonary artery. Small amount of pericardial fluid or thickening. Calcified granulomas adjacent to the EG junction region.  The unenhanced appearance of the liver, spleen, gallbladder, pancreas, adrenal glands, kidneys, abdominal aorta, inferior vena cava, and retroperitoneal lymph nodes is unremarkable. Stomach and small bowel are decompressed. Prominent stool-filled rectosigmoid colon with gaseous distention of the right hand transverse colon.  This likely suggests constipation with pseudo-obstruction. No free air or free fluid in the abdomen. No abdominal or retroperitoneal hematomas demonstrated. Nonspecific vague infiltration in the visualize subcutaneous fat.  Pelvis: Bladder is decompressed. Uterus and ovaries are not enlarged. No free or loculated pelvic fluid collections. No pelvic mass or lymphadenopathy. No evidence of pelvic hematoma. Degenerative changes in the spine. Slight anterior subluxation of L4 on L5 is probably degenerative. No destructive bone lesions.  IMPRESSION: No evidence of abdominal, pelvic, or retroperitoneal hematoma. Prominent stool-filled rectosigmoid colon with gaseous distention of the remainder of the colon suggesting obstipation with pseudo-obstruction. Mass versus prominent vascularity in the subcarinal region.   Electronically Signed   By: Lucienne Capers M.D.   On: 12/15/2014 01:07   Dg Chest 2 View  12/14/2014   CLINICAL DATA:  Seizures  EXAM: CHEST  2 VIEW  COMPARISON:  None.  FINDINGS: There is no appreciable edema or consolidation. There is elevation of the left hemidiaphragm. Heart is mildly enlarged with pulmonary vascularity  within normal limits. No pneumothorax. No adenopathy.  IMPRESSION: Elevation left hemidiaphragm. No edema or consolidation. Mild cardiac enlargement.   Electronically Signed   By: Lowella Grip M.D.   On: 12/14/2014 19:03   Ct Head Wo Contrast  12/14/2014   CLINICAL DATA:  Seizure  EXAM: CT HEAD WITHOUT CONTRAST  TECHNIQUE: Contiguous axial images were obtained from the base of the skull through the vertex without intravenous contrast.  COMPARISON:  None.  FINDINGS: No evidence of parenchymal hemorrhage or extra-axial fluid collection. No mass lesion, mass effect, or midline shift.  No CT evidence of acute infarction.  Subcortical white matter and periventricular small vessel ischemic changes. Intracranial atherosclerosis.  Cerebral volume is within normal limits.  No ventriculomegaly.  Mild mucosal thickening in the left maxillary sinus. Mastoid air cells are clear.  No evidence of calvarial fracture.  IMPRESSION: No evidence of acute intracranial abnormality.  Small vessel ischemic changes with intracranial atherosclerosis.   Electronically Signed   By: Julian Hy M.D.   On: 12/14/2014 18:41   US Renal  12/15/2014   CLINICAL DATA:  Acute renal insufficiency, initial evaluation  EXAM: RENAL/URINARY TRACT ULTRASOUND COMPLETE  COMPARISON:  None.  FINDINGS: Right Kidney:  Length: 9.0 cm. Echogenicity within normal limits. No mass or hydronephrosis visualized.  Left Kidney:  Length: 9.9 cm. Echogenicity within normal limits. No mass or hydronephrosis visualized.  Bladder:  Not visualized, likely decompressed by a known Foley catheter  IMPRESSION: Study is moderately limited by patient body habitus but appears grossly normal.   Electronically Signed   By: Skipper Cliche M.D.   On: 12/15/2014 12:54    ROS Blood pressure 112/61, pulse 92, temperature 97.8 F (36.6 C), temperature source Oral, resp. rate 16, height $RemoveBe'5\' 7"'[id number]$  (1.702 m), weight 148.5 kg (327 lb 6.1 oz), SpO2 100 %. Physical Exam Physical  Examination: General appearance - overweight and appropriate and pleasant Mental status - alert, oriented to person, place, and time Eyes - pupils equal and reactive, extraocular eye movements intact, funduscopic exam normal, discs flat and sharp Mouth - mucous membranes moist, pharynx normal without lesions Neck - adenopathy noted PCL Lymphatics - posterior cervical nodes Chest - decreased air entry noted bilat Heart - S1 and S2 normal, systolic murmur VZ5/6 at apex Abdomen - obese, liver down 6 cm Musculoskeletal - hypertrophic changes,PIPs, DIPs Extremities - pedal edema 1 +, does not move L side Skin - dry esp UE and evidence neuroderm  Assessment/Plan: 1  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  Suspect GIB is primary etiology of low BP.  Doubt UTI is cause.  Need to consider AIN from drugs also.  Has mild vol deficit, acidemia, but K ok.  Needs bicarb replacement at this time and not RRT now. 3 Hypertension: not an issue now 4. Anemia GIB but chronic issues, suspect at least partially related to CKD. Will eval 5. Metabolic Bone Disease: need to check PTH 6 UTI culture pending 7 GIB undergoing eval 8 Morbid obesity 9 CVA 10 Immobility 11 Decubiti P Urine chem, repeat UA, urine for eos, isotonic bicarb, check Uric acid, LDH,   Raeden Schippers L 12/15/2014, 1:52 PM

## 2014-12-15 NOTE — Progress Notes (Signed)
Patient ID: Lauren Barber  female  TGY:[id number]    DOB: 07-Jan-1956    DOA: 12/14/2014  PCP: Gildardo Cranker, DO  Brief history of present illness  Patient is a 59 year old female with hypertension, morbid obesity, diabetic neuropathy, diabetes mellitus, decub ulcer, CVA with left-sided chronic weakness, chronic anemia, ? Seizure history on Keppra presented with seizure-like episodes. Patient noted that she had 3 seizure-like episodes in the last 3 days and multiple episodes on the day of admission yesterday. Patient reported that the episodes started as tremor-like sensation in the leg and then spread to her body. First episode started when she was having a BM and then multiple episodes on the day of admission. She had an episode of incontinence once but no tongue biting. At the time of admission she was slightly confused. Patient was admitted for further workup. She was diagnosed with DVT in June 2015 and was scheduled to go off anticoagulation in December.  Assessment/Plan: Principal Problem:   SIRS (systemic inflammatory response syndrome) versus sepsis contributing to UTI - Patient was significantly hypotensive in the ED, continue IV fluids, IV Rocephin - Continue to hold Lasix, lisinopril  Active Problems: Seizure/seizure-like activity - Patient noted to be on [place] outpatient however she reports that she doesn't know why she is on it and she does not have history of seizures - EEG done today, abnormal with mild localized nonspecific continuous slowing of cerebral activity, no evidence of epileptic disorder -  she is currently alert and awake, on diet, started on Keppra at her home dose. Neurology consulted. - CT head negative for any intracranial abnormality  Acute renal failure: Baseline creatinine 1.3, admitted with creatinine of 4.1-> 4.7 likely worsened due to ATN versus UTI, medications - No significant improvement despite IV fluids, packed RBC transfusion, renal ultrasound  normal with no hydronephrosis or obstruction - Continue to hold Lasix, lisinopril - Nephrology consulted  UTI - Follow urine culture and sensitivities, continue IV Rocephin    Essential hypertension Currently stable however continue to hold Lasix, lisinopril    Acute on chronic Anemia - FOBT positive, on eliquis for anticoagulation  - CT abdomen and pelvis negative for any retroperitoneal hematoma.  - Stool occult test positive, receiving 2 units packed RBC, will check CBC after second unit transfusion.  - GI consulted, recommended colonoscopy +/- EGD - No need for xarelto, patient has been on anti-coagulation beyond 3 months   Abnormal CT -  CT abdomen and pelvis showed incidentally prominent density in the subcarinal region representing mass or lymphadenopathy or enlarged pulmonary artery. Chest x-ray negative. Will obtain CT chest for further workup, it has to be done without contrast due to her acute renal failure for further evaluation   DVT Prophylaxis:SCDs   Code Status: full code   Family Communication: no family member at the bedside   Disposition:  Consultants Nephrology Gastroenterology  Procedures:  CT abd   Antibiotics:  None    Subjective:   Patient seen and examined, at the time of my examination, no specific complaints except for feeling 'cold'. No seizure or seizure-like activity. No coughing, fevers or chills. BP somewhat improving, overnight was in 80s  Objective: Weight change:   Intake/Output Summary (Last 24 hours) at 12/15/14 1444 Last data filed at 12/15/14 1340  Gross per 24 hour  Intake   1770 ml  Output    525 ml  Net   1245 ml   Blood pressure 112/61, pulse 92, temperature 97.8 F (36.6 C), temperature  source Oral, resp. rate 16, height 5\' 7"  (1.702 m), weight 148.5 kg (327 lb 6.1 oz), SpO2 100 %.  Physical Exam: General: Alert and awake, oriented x3, not in any acute distress. HEENT: anicteric sclera, PERLA, EOMI CVS: S1-S2  clear, no murmur rubs or gallops Chest: clear to auscultation bilaterally, no wheezing, rales or rhonchi Abdomen: morbidly obese,  soft nontender, nondistended, normal bowel sounds  Extremities: no cyanosis, clubbing or edema noted bilaterally Neuro:  chronic left-sided weakness  Results: Basic Metabolic Panel:  Recent Labs Lab 12/14/14 1815 12/15/14 0758  NA 133* 133*  K 4.5 4.2  CL 104 105  CO2 16* 15*  GLUCOSE 95 81  BUN 97* 95*  CREATININE 4.74* 4.81*  CALCIUM 9.7 9.2   Liver Function Tests:  Recent Labs Lab 12/14/14 1815 12/15/14 0758  AST 16 14  ALT 15 13  ALKPHOS 112 98  BILITOT 0.4 0.6  PROT 8.4* 7.0  ALBUMIN 3.4* 3.0*   No results for input(s): LIPASE, AMYLASE in the last 168 hours.  Recent Labs Lab 12/14/14 2212  AMMONIA 38*   CBC:  Recent Labs Lab 12/14/14 1815 12/14/14 2212 12/15/14 0758  WBC 9.1  --  9.5  NEUTROABS 6.2  --  6.5  HGB 7.5* 7.1* 7.5*  HCT 22.7* 21.6* 23.0*  MCV 91.5  --  90.6  PLT 339  --  299   Cardiac Enzymes:  Recent Labs Lab 12/14/14 2212  CKTOTAL 175   BNP: Invalid input(s): POCBNP CBG:  Recent Labs Lab 12/15/14 0911  GLUCAP 64     Micro Results: Recent Results (from the past 240 hour(s))  MRSA PCR Screening     Status: Abnormal   Collection Time: 12/15/14  1:28 AM  Result Value Ref Range Status   MRSA by PCR POSITIVE (A) NEGATIVE Final    Comment:        The GeneXpert MRSA Assay (FDA approved for NASAL specimens only), is one component of a comprehensive MRSA colonization surveillance program. It is not intended to diagnose MRSA infection nor to guide or monitor treatment for MRSA infections. RESULT CALLED TO, READ BACK BY AND VERIFIED WITH: USSERY,K RN 1962 12/15/14 MITCHELL,L     Studies/Results: Ct Abdomen Pelvis Wo Contrast  12/15/2014   CLINICAL DATA:  Evaluate for bleed. Anemia and low back pain. Long-term anticoagulant use.  EXAM: CT ABDOMEN AND PELVIS WITHOUT CONTRAST  TECHNIQUE:  Multidetector CT imaging of the abdomen and pelvis was performed following the standard protocol without IV contrast.  COMPARISON:  10/24/2013  FINDINGS: Slight fibrosis in the lung bases. Prominent density demonstrated in the subcarinal region may represent mass or lymphadenopathy or could represent enlarged pulmonary artery. Small amount of pericardial fluid or thickening. Calcified granulomas adjacent to the EG junction region.  The unenhanced appearance of the liver, spleen, gallbladder, pancreas, adrenal glands, kidneys, abdominal aorta, inferior vena cava, and retroperitoneal lymph nodes is unremarkable. Stomach and small bowel are decompressed. Prominent stool-filled rectosigmoid colon with gaseous distention of the right hand transverse colon. This likely suggests constipation with pseudo-obstruction. No free air or free fluid in the abdomen. No abdominal or retroperitoneal hematomas demonstrated. Nonspecific vague infiltration in the visualize subcutaneous fat.  Pelvis: Bladder is decompressed. Uterus and ovaries are not enlarged. No free or loculated pelvic fluid collections. No pelvic mass or lymphadenopathy. No evidence of pelvic hematoma. Degenerative changes in the spine. Slight anterior subluxation of L4 on L5 is probably degenerative. No destructive bone lesions.  IMPRESSION: No evidence of abdominal,  pelvic, or retroperitoneal hematoma. Prominent stool-filled rectosigmoid colon with gaseous distention of the remainder of the colon suggesting obstipation with pseudo-obstruction. Mass versus prominent vascularity in the subcarinal region.   Electronically Signed   By: Lucienne Capers M.D.   On: 12/15/2014 01:07   Dg Chest 2 View  12/14/2014   CLINICAL DATA:  Seizures  EXAM: CHEST  2 VIEW  COMPARISON:  None.  FINDINGS: There is no appreciable edema or consolidation. There is elevation of the left hemidiaphragm. Heart is mildly enlarged with pulmonary vascularity within normal limits. No  pneumothorax. No adenopathy.  IMPRESSION: Elevation left hemidiaphragm. No edema or consolidation. Mild cardiac enlargement.   Electronically Signed   By: Lowella Grip M.D.   On: 12/14/2014 19:03   Ct Head Wo Contrast  12/14/2014   CLINICAL DATA:  Seizure  EXAM: CT HEAD WITHOUT CONTRAST  TECHNIQUE: Contiguous axial images were obtained from the base of the skull through the vertex without intravenous contrast.  COMPARISON:  None.  FINDINGS: No evidence of parenchymal hemorrhage or extra-axial fluid collection. No mass lesion, mass effect, or midline shift.  No CT evidence of acute infarction.  Subcortical white matter and periventricular small vessel ischemic changes. Intracranial atherosclerosis.  Cerebral volume is within normal limits.  No ventriculomegaly.  Mild mucosal thickening in the left maxillary sinus. Mastoid air cells are clear.  No evidence of calvarial fracture.  IMPRESSION: No evidence of acute intracranial abnormality.  Small vessel ischemic changes with intracranial atherosclerosis.   Electronically Signed   By: Julian Hy M.D.   On: 12/14/2014 18:41   US Renal  12/15/2014   CLINICAL DATA:  Acute renal insufficiency, initial evaluation  EXAM: RENAL/URINARY TRACT ULTRASOUND COMPLETE  COMPARISON:  None.  FINDINGS: Right Kidney:  Length: 9.0 cm. Echogenicity within normal limits. No mass or hydronephrosis visualized.  Left Kidney:  Length: 9.9 cm. Echogenicity within normal limits. No mass or hydronephrosis visualized.  Bladder:  Not visualized, likely decompressed by a known Foley catheter  IMPRESSION: Study is moderately limited by patient body habitus but appears grossly normal.   Electronically Signed   By: Skipper Cliche M.D.   On: 12/15/2014 12:54    Medications: Scheduled Meds: . allopurinol  200 mg Oral Daily  . amitriptyline  100 mg Oral QHS  . bisacodyl  5 mg Oral Q8H  . cefTRIAXone (ROCEPHIN)  IV  1 g Intravenous Daily  . Chlorhexidine Gluconate Cloth  6 each  Topical Q0600  . FLUoxetine  40 mg Oral Daily  . lactulose  10 g Oral BID  . levETIRAcetam  500 mg Oral BID  . mupirocin ointment  1 application Nasal BID  . pantoprazole (PROTONIX) IV  40 mg Intravenous Q12H  . polyethylene glycol  17 g Oral 6 times per day  . sodium chloride  500 mL Intravenous Once  . sodium chloride  3 mL Intravenous Q12H      LOS: 1 day   Juanice Warburton M.D. [facility] 12/15/2014, 2:44 PM Pager: [phone]  If 7PM-7AM, please contact night-coverage [email or web address] Password [email or web address]

## 2014-12-15 NOTE — Clinical Social Work Psychosocial (Signed)
Clinical Social Work Department BRIEF PSYCHOSOCIAL ASSESSMENT 12/15/2014  Patient:  Lauren Barber, Lauren Barber     Account Number:  [id number]     Admit date:  12/14/2014  Clinical Social Worker:  Marciano Sequin  Date/Time:  12/15/2014 11:23 AM  Referred by:  RN  Date Referred:  12/15/2014 Referred for  SNF Placement   Other Referral:   Interview type:  Patient Other interview type:    PSYCHOSOCIAL DATA Living Status:  FACILITY Admitted from facility:  [facility] Level of care:  Long Term Acute Primary support name:  [name] Primary support relationship to patient:  PARENT Degree of support available:   Fair Support    CURRENT CONCERNS Current Concerns  Post-Acute Placement   Other Concerns:    SOCIAL WORK ASSESSMENT / PLAN CSW met the pt at the bedside. CSW introduced self and purpose of visit. Pt confirmed being a long-term care pt at [facility]. The pt reported she would not like to transition back to [facility]. Pt expressed interested in receiving long-term care services from another SNF. CSW provided pt with SNF list. CSW explained the SNF process. CSW provided the pt with contact information for further questions. CSW will continue to follow this pt and assist with discharge as needed.   Assessment/plan status:  Psychosocial Support/Ongoing Assessment of Needs Other assessment/ plan:   Information/referral to community resources:    PATIENT'S/FAMILY'S RESPONSE TO PLAN OF CARE: Pt presented with a normal affect and mood. Pt oriented 4x. Pt understands her health condition and agrees that she needs long-term placement. Pt desires an alternate SNF where she feels she will be happy.   [place], MSW, [place]

## 2014-12-15 NOTE — Progress Notes (Signed)
Pt's BP 70-80s/40s. MD at bedside. Orders to collect CBC before transfusing 2nd unit of blood. Also verified NPO order. MD would like pt to remain NPO for now.

## 2014-12-15 NOTE — Progress Notes (Signed)
ANTIBIOTIC CONSULT NOTE - INITIAL  Pharmacy Consult for vancomycin Indication: bacteremia  No Known Allergies  Patient Measurements: Height: 5\' 7"  (170.2 cm) Weight: (!) 327 lb 6.1 oz (148.5 kg) IBW/kg (Calculated) : 61.6   Vital Signs: Temp: 98.1 F (36.7 C) (01/27 1630) Temp Source: Oral (01/27 1630) BP: 102/54 mmHg (01/27 1600) Pulse Rate: 92 (01/27 1340) Intake/Output from previous day: 01/26 0701 - 01/27 0700 In: 1085 [I.V.:700; Blood:335; IV Piggyback:50] Out: 525 [Urine:525] Intake/Output from this shift:    Labs:  Recent Labs  12/14/14 12/14/14 1815 12/14/14 2212 12/15/14 0758 12/15/14 1501  WBC 10.4 9.1  --  9.5  --   HGB 7.6* 7.5* 7.1* 7.5*  --   PLT 356 339  --  299  --   LABCREA  --   --   --   --  51.57  CREATININE 4.15* 4.74*  --  4.81*  --    Estimated Creatinine Clearance: 19.9 mL/min (by C-G formula based on Cr of 4.81). No results for input(s): VANCOTROUGH, VANCOPEAK, VANCORANDOM, GENTTROUGH, GENTPEAK, GENTRANDOM, TOBRATROUGH, TOBRAPEAK, TOBRARND, AMIKACINPEAK, AMIKACINTROU, AMIKACIN in the last 72 hours.   Microbiology: Recent Results (from the past 720 hour(s))  Blood culture (routine x 2)     Status: None (Preliminary result)   Collection Time: 12/14/14  9:17 PM  Result Value Ref Range Status   Specimen Description BLOOD LEFT HAND  Final   Special Requests BOTTLES DRAWN AEROBIC ONLY 6CC  Final   Culture   Final    GRAM POSITIVE COCCI IN CLUSTERS Note: Gram Stain Report Called to,Read Back By and Verified With: Carma Leaven RN [phone] Performed at [facility]    Report Status PENDING  Incomplete  MRSA PCR Screening     Status: Abnormal   Collection Time: 12/15/14  1:28 AM  Result Value Ref Range Status   MRSA by PCR POSITIVE (A) NEGATIVE Final    Comment:        The GeneXpert MRSA Assay (FDA approved for NASAL specimens only), is one component of a comprehensive MRSA colonization surveillance program. It is not intended to  diagnose MRSA infection nor to guide or monitor treatment for MRSA infections. RESULT CALLED TO, READ BACK BY AND VERIFIED WITH: USSERY,K RN [phone] 12/15/14 MITCHELL,L     Medical History: Past Medical History  Diagnosis Date  . Hypertension   . Obesities, morbid   . Vaginal bleeding   . Endometriosis   . Atopic conjunctivitis   . Diabetes mellitus without complication   . Cellulitis     legs  . Insomnia   . Cardiomegaly   . Gout     feet  . Anemia   . Stroke 2013  . CVA (cerebral vascular accident)   . Anxiety   . Headache(784.0)     otc meds prn  . Degenerative, intervertebral disc     back, legs  . History of blood transfusion [facility] - unsure number of units transfused  . Neuromuscular disorder     diabetic neuropathy - feet  . Depressive disorder     depressive disorder    Medications:  Prescriptions prior to admission  Medication Sig Dispense Refill Last Dose  . acetaminophen (TYLENOL) 325 MG tablet Take 650 mg by mouth every 6 (six) hours as needed for mild pain.   unknown  . allopurinol (ZYLOPRIM) 100 MG tablet Take 200 mg by mouth daily.   12/15/2014 at Unknown time  . amitriptyline (ELAVIL) 100 MG  tablet Take 100 mg by mouth at bedtime.   12/14/2014 at Unknown time  . apixaban (ELIQUIS) 5 MG TABS tablet Take 5 mg by mouth 2 (two) times daily. For dvt   12/15/2014 at Unknown time  . diphenhydrAMINE (BENADRYL) 25 MG tablet Take 25 mg by mouth every 8 (eight) hours as needed for itching. For itching   unknown  . EPINEPHrine (EPIPEN) 0.3 mg/0.3 mL DEVI Inject 0.3 mg into the muscle once.   unknown  . FLUoxetine (PROZAC) 20 MG capsule Take 2 capsules (40 mg total) by mouth daily. 120 capsule 11 12/15/2014 at Unknown time  . furosemide (LASIX) 40 MG tablet Take 40 mg by mouth 2 (two) times daily.    12/15/2014 at Unknown time  . lactulose (CHRONULAC) 10 GM/15ML solution Take 10 g by mouth 2 (two) times daily. constipation   12/15/2014 at Unknown time  .  levETIRAcetam (KEPPRA) 250 MG tablet Take 1 tablet (250 mg total) by mouth 2 (two) times daily. 14 tablet 0 12/15/2014 at Unknown time  . levETIRAcetam (KEPPRA) 500 MG tablet Take 1 tablet (500 mg total) by mouth 2 (two) times daily. 120 tablet 11   . lisinopril (PRINIVIL,ZESTRIL) 5 MG tablet Take 5 mg by mouth daily. Hold for SBP <100   12/14/2014 at Unknown time  . Melatonin 3 MG TABS Take 3 mg by mouth at bedtime.   12/13/2014 at Unknown time  . methocarbamol (ROBAXIN) 500 MG tablet Take 500 mg by mouth every 8 (eight) hours as needed for muscle spasms.   12/15/2014 at Unknown time  . Multiple Vitamins-Minerals (MULTIVITAMIN WITH MINERALS) tablet Take 1 tablet by mouth daily.   12/15/2014 at Unknown time  . sodium chloride (OCEAN) 0.65 % SOLN nasal spray Place 1 spray into both nostrils as needed for congestion.   unknown  . tiZANidine (ZANAFLEX) 4 MG tablet Take 4 mg by mouth every 8 (eight) hours as needed for muscle spasms.   12/13/2014  . traMADol (ULTRAM) 50 MG tablet Take 4 tablets (200 mg total) by mouth every 12 (twelve) hours. For pain 240 tablet 5 12/15/2014 at Unknown time   Assessment: 59 yo F with + blood cultures for gram positive cocci in clusters on 12/14/14 BC.  Pharmacy consulted to dose vancomycin.  WBC WNL at 9.5, creatinine elevated at 4.81. Pt morbidly obese, wt 148.5 kg.  She is on rocephin for UTI.  SIRS versus sepsis contributing to UTI.  New acute renal failure with no improvement after fluids and blood transfusion.   rocephin 1/27 >> Vancomycin 1/27>>  1/26 BC: GPCocci in clusters   Goal of Therapy:  Vancomycin trough level 15-20 mcg/ml  Plan:  -vancomycin 2500 mg IV x 1 loading dose -check vancomycin level in 48 - 72 hours to determine timing of next dose -f/u renal fxn, wbc, temp, culture results  Eudelia Bunch, Pharm.D. [phone] 12/15/2014 8:28 PM

## 2014-12-15 NOTE — Consult Note (Signed)
NEURO HOSPITALIST CONSULT NOTE    Reason for Consult: seizure like activity  HPI:                                                                                                                                          Lauren Barber is an 59 y.o. female PMH: morbid obesity, IDDM type 2 with neuropathy, left side weakness post CVA. Requires lift for bed to wheelchair transitions, non-ambulatory. Hx Endometriosis. Post menopausal bleeding. Anemia. Chronic constipation. On Eliquis for DVT in 04/2014 which was scheduled to stop after 10/2014. Hypotensive in ED with ? SIRS/Sepsis due to UTI. CT head negative.  Patient has had a previous stroke effecting her left side and since states she has been bed bound.  She resides in a nursing home and per patient is unable to get up and move.  Patient states that yesterday she was trying "hard to have a bowel movement (she was supine having BM in her depends) when she felt as though her feet started to shake and then she noted her trunk and then bilateral hand and harms would flail about.  She was fully conscious and able to converse during entire event.  She states the event lasted for about 3-5 seconds and then stopped.  She was alert the whole period of time and had no post ictal period of confusion. Patient states this occurred a few more times while in hospital even while on Keppra (which was started by the SNF). Today she has had no further events.  She feels defecation is is the initiating factor.  EEG obtained today shows no epileptiform activity.    Past Medical History  Diagnosis Date  . Hypertension   . Obesities, morbid   . Vaginal bleeding   . Endometriosis   . Atopic conjunctivitis   . Diabetes mellitus without complication   . Cellulitis     legs  . Insomnia   . Cardiomegaly   . Gout     feet  . Anemia   . Stroke 2013  . CVA (cerebral vascular accident)   . Anxiety   . Headache(784.0)     otc meds prn  .  Degenerative, intervertebral disc     back, legs  . History of blood transfusion [place] - unsure number of units transfused  . Neuromuscular disorder     diabetic neuropathy - feet  . Depressive disorder     depressive disorder    Past Surgical History  Procedure Laterality Date  . Cesarean section  1982, 1989    x 2  . Biopsy endometrial N/A 10 03 2013  . Dilation and curettage of uterus  2013  . Tonsillectomy    . Wisdom tooth extraction    . Hysteroscopy  w/d&c N/A 06/28/2014    Procedure: DILATATION AND CURETTAGE /HYSTEROSCOPY;  Surgeon: Lavonia Drafts, MD;  Location: [facility];  Service: Gynecology;  Laterality: N/A;    Family History  Problem Relation Age of Onset  . Hypertension Mother   . Hypertension Father     Social History:  reports that she quit smoking about 2 years ago. Her smoking use included Cigarettes. She has a 21.5 pack-year smoking history. She has never used smokeless tobacco. She reports that she does not drink alcohol or use illicit drugs.  No Known Allergies  MEDICATIONS:                                                                                                                     Prior to Admission:  Prescriptions prior to admission  Medication Sig Dispense Refill Last Dose  . amitriptyline (ELAVIL) 100 MG tablet Take 100 mg by mouth at bedtime.   Taking  . apixaban (ELIQUIS) 5 MG TABS tablet Take 5 mg by mouth 2 (two) times daily. For dvt   Taking  . FLUoxetine (PROZAC) 20 MG capsule Take 2 capsules (40 mg total) by mouth daily. 120 capsule 11   . furosemide (LASIX) 40 MG tablet Take 40 mg by mouth 2 (two) times daily.    Taking  . lisinopril (PRINIVIL,ZESTRIL) 5 MG tablet Take 5 mg by mouth daily. Hold for SBP <100   Taking  . allopurinol (ZYLOPRIM) 100 MG tablet Take 200 mg by mouth daily.   Taking  . diphenhydrAMINE (BENADRYL) 25 MG tablet Take 25 mg by mouth every 8 (eight) hours as needed for itching. For itching   Taking   . EPINEPHrine (EPIPEN) 0.3 mg/0.3 mL DEVI Inject 0.3 mg into the muscle once.   Taking  . lactulose (CHRONULAC) 10 GM/15ML solution Take 10 g by mouth 2 (two) times daily. constipation   Taking  . levETIRAcetam (KEPPRA) 250 MG tablet Take 1 tablet (250 mg total) by mouth 2 (two) times daily. 14 tablet 0   . levETIRAcetam (KEPPRA) 500 MG tablet Take 1 tablet (500 mg total) by mouth 2 (two) times daily. 120 tablet 11   . Melatonin 3 MG TABS Take 3 mg by mouth at bedtime.   Taking  . Multiple Vitamins-Minerals (MULTIVITAMIN WITH MINERALS) tablet Take 1 tablet by mouth daily.   Taking  . tiZANidine (ZANAFLEX) 4 MG tablet Take 4 mg by mouth every 8 (eight) hours as needed for muscle spasms.   Taking  . traMADol (ULTRAM) 50 MG tablet Take 4 tablets (200 mg total) by mouth every 12 (twelve) hours. For pain 240 tablet 5 Taking  . triamcinolone ointment (KENALOG) 0.1 % Apply 1 application topically daily as needed (to legs for dermatitis).   Taking   Scheduled: . allopurinol  200 mg Oral Daily  . amitriptyline  100 mg Oral QHS  . bisacodyl  5 mg Oral Q8H  . cefTRIAXone (ROCEPHIN)  IV  1 g Intravenous Daily  . Chlorhexidine Gluconate Cloth  6 each Topical Q0600  . FLUoxetine  40 mg Oral Daily  . lactulose  10 g Oral BID  . levETIRAcetam  500 mg Oral BID  . mupirocin ointment  1 application Nasal BID  . pantoprazole (PROTONIX) IV  40 mg Intravenous Q12H  . polyethylene glycol  17 g Oral 6 times per day  . sodium chloride  500 mL Intravenous Once  . sodium chloride  3 mL Intravenous Q12H     ROS:                                                                                                                                       History obtained from the patient  General ROS: negative for - chills, fatigue, fever, night sweats, weight gain or weight loss Psychological ROS: negative for - behavioral disorder, hallucinations, memory difficulties, mood swings or suicidal ideation Ophthalmic ROS:  negative for - blurry vision, double vision, eye pain or loss of vision ENT ROS: negative for - epistaxis, nasal discharge, oral lesions, sore throat, tinnitus or vertigo Allergy and Immunology ROS: negative for - hives or itchy/watery eyes Hematological and Lymphatic ROS: negative for - bleeding problems, bruising or swollen lymph nodes Endocrine ROS: negative for - galactorrhea, hair pattern changes, polydipsia/polyuria or temperature intolerance Respiratory ROS: negative for - cough, hemoptysis, shortness of breath or wheezing Cardiovascular ROS: negative for - chest pain, dyspnea on exertion, edema or irregular heartbeat Gastrointestinal ROS: negative for - abdominal pain, diarrhea, hematemesis, nausea/vomiting or stool incontinence Genito-Urinary ROS: negative for - dysuria, hematuria, incontinence or urinary frequency/urgency Musculoskeletal ROS: negative for - joint swelling or muscular weakness Neurological ROS: as noted in HPI Dermatological ROS: negative for rash and skin lesion changes   Blood pressure 106/57, pulse 95, temperature 98.3 F (36.8 C), temperature source Oral, resp. rate 16, height 5\' 7"  (1.702 m), weight 148.5 kg (327 lb 6.1 oz), SpO2 99 %.   Neurologic Examination:                                                                                                      HEENT-  Normocephalic, no lesions, without obvious abnormality.  Normal external eye and conjunctiva.  Normal TM's bilaterally.  Normal auditory canals and external ears. Normal external nose, mucus membranes and septum.  Normal pharynx. Cardiovascular- S1, S2 normal, pulses palpable throughout   Lungs- no tachypnea, retractions or cyanosis, Heart exam - S1, S2 normal, no murmur, no gallop, rate regular Abdomen- normal findings: decreased  BS Extremities- no edema Lymph-no adenopathy palpable Musculoskeletal-no joint tenderness, deformity or swelling Skin-warm and dry, no hyperpigmentation, vitiligo, or  suspicious lesions  Neurological Examination Mental Status: Alert, oriented, thought content appropriate.  Speech fluent without evidence of aphasia.  Able to follow 3 step commands without difficulty. Cranial Nerves: II: Discs flat bilaterally; Visual fields grossly normal, pupils equal, round, reactive to light and accommodation III,IV, VI: ptosis not present, extra-ocular motions intact bilaterally V,VII: smile symmetric, facial light touch sensation normal bilaterally VIII: hearing normal bilaterally IX,X: gag reflex present XI: bilateral shoulder shrug XII: midline tongue extension Motor: Right : Upper extremity   5/5    Left:     Upper extremity   5/5  Lower extremity   4/5     Lower extremity   4/5 --LE exam limited due to body habitus and patients willing to take part and give good effort.  Tone and bulk:normal tone throughout; no atrophy noted Sensory: Pinprick and light touch intact throughout, bilaterally Deep Tendon Reflexes: diminished throughout Plantars: Mute bilaterally Cerebellar: normal finger-to-nose, Gait: bed bound      Lab Results: Basic Metabolic Panel:  Recent Labs Lab 12/14/14 12/14/14 1815 12/15/14 0758  NA 133* 133* 133*  K 4.4 4.5 4.2  CL 104 104 105  CO2 17* 16* 15*  GLUCOSE 79 95 81  BUN 89* 97* 95*  CREATININE 4.15* 4.74* 4.81*  CALCIUM 9.2 9.7 9.2    Liver Function Tests:  Recent Labs Lab 12/14/14 12/14/14 1815 12/15/14 0758  AST 14 16 14   ALT 13 15 13   ALKPHOS 111 112 98  BILITOT 0.3 0.4 0.6  PROT 7.3 8.4* 7.0  ALBUMIN 3.5 3.4* 3.0*   No results for input(s): LIPASE, AMYLASE in the last 168 hours.  Recent Labs Lab 12/14/14 2212  AMMONIA 38*    CBC:  Recent Labs Lab 12/14/14 12/14/14 1815 12/14/14 2212 12/15/14 0758  WBC 10.4 9.1  --  9.5  NEUTROABS  --  6.2  --  6.5  HGB 7.6* 7.5* 7.1* 7.5*  HCT 22.8* 22.7* 21.6* 23.0*  MCV 93.1 91.5  --  90.6  PLT 356 339  --  299    Cardiac Enzymes:  Recent  Labs Lab 12/14/14 2212  CKTOTAL 175    Lipid Panel: No results for input(s): CHOL, TRIG, HDL, CHOLHDL, VLDL, LDLCALC in the last 168 hours.  CBG:  Recent Labs Lab 12/15/14 0911  GLUCAP 64    Microbiology: Results for orders placed or performed during the hospital encounter of 12/14/14  MRSA PCR Screening     Status: Abnormal   Collection Time: 12/15/14  1:28 AM  Result Value Ref Range Status   MRSA by PCR POSITIVE (A) NEGATIVE Final    Comment:        The GeneXpert MRSA Assay (FDA approved for NASAL specimens only), is one component of a comprehensive MRSA colonization surveillance program. It is not intended to diagnose MRSA infection nor to guide or monitor treatment for MRSA infections. RESULT CALLED TO, READ BACK BY AND VERIFIED WITH: USSERY,K RN 2202 12/15/14 MITCHELL,L     Coagulation Studies:  Recent Labs  12/14/14 2212 12/15/14 0758  LABPROT 20.3* 20.4*  INR 1.72* 1.73*    Imaging: Ct Abdomen Pelvis Wo Contrast  12/15/2014   CLINICAL DATA:  Evaluate for bleed. Anemia and low back pain. Long-term anticoagulant use.  EXAM: CT ABDOMEN AND PELVIS WITHOUT CONTRAST  TECHNIQUE: Multidetector CT imaging of the abdomen and pelvis was performed following the  standard protocol without IV contrast.  COMPARISON:  10/24/2013  FINDINGS: Slight fibrosis in the lung bases. Prominent density demonstrated in the subcarinal region may represent mass or lymphadenopathy or could represent enlarged pulmonary artery. Small amount of pericardial fluid or thickening. Calcified granulomas adjacent to the EG junction region.  The unenhanced appearance of the liver, spleen, gallbladder, pancreas, adrenal glands, kidneys, abdominal aorta, inferior vena cava, and retroperitoneal lymph nodes is unremarkable. Stomach and small bowel are decompressed. Prominent stool-filled rectosigmoid colon with gaseous distention of the right hand transverse colon. This likely suggests constipation with  pseudo-obstruction. No free air or free fluid in the abdomen. No abdominal or retroperitoneal hematomas demonstrated. Nonspecific vague infiltration in the visualize subcutaneous fat.  Pelvis: Bladder is decompressed. Uterus and ovaries are not enlarged. No free or loculated pelvic fluid collections. No pelvic mass or lymphadenopathy. No evidence of pelvic hematoma. Degenerative changes in the spine. Slight anterior subluxation of L4 on L5 is probably degenerative. No destructive bone lesions.  IMPRESSION: No evidence of abdominal, pelvic, or retroperitoneal hematoma. Prominent stool-filled rectosigmoid colon with gaseous distention of the remainder of the colon suggesting obstipation with pseudo-obstruction. Mass versus prominent vascularity in the subcarinal region.   Electronically Signed   By: Lucienne Capers M.D.   On: 12/15/2014 01:07   Dg Chest 2 View  12/14/2014   CLINICAL DATA:  Seizures  EXAM: CHEST  2 VIEW  COMPARISON:  None.  FINDINGS: There is no appreciable edema or consolidation. There is elevation of the left hemidiaphragm. Heart is mildly enlarged with pulmonary vascularity within normal limits. No pneumothorax. No adenopathy.  IMPRESSION: Elevation left hemidiaphragm. No edema or consolidation. Mild cardiac enlargement.   Electronically Signed   By: Lowella Grip M.D.   On: 12/14/2014 19:03   Ct Head Wo Contrast  12/14/2014   CLINICAL DATA:  Seizure  EXAM: CT HEAD WITHOUT CONTRAST  TECHNIQUE: Contiguous axial images were obtained from the base of the skull through the vertex without intravenous contrast.  COMPARISON:  None.  FINDINGS: No evidence of parenchymal hemorrhage or extra-axial fluid collection. No mass lesion, mass effect, or midline shift.  No CT evidence of acute infarction.  Subcortical white matter and periventricular small vessel ischemic changes. Intracranial atherosclerosis.  Cerebral volume is within normal limits.  No ventriculomegaly.  Mild mucosal thickening in the  left maxillary sinus. Mastoid air cells are clear.  No evidence of calvarial fracture.  IMPRESSION: No evidence of acute intracranial abnormality.  Small vessel ischemic changes with intracranial atherosclerosis.   Electronically Signed   By: Julian Hy M.D.   On: 12/14/2014 18:41   US Renal  12/15/2014   CLINICAL DATA:  Acute renal insufficiency, initial evaluation  EXAM: RENAL/URINARY TRACT ULTRASOUND COMPLETE  COMPARISON:  None.  FINDINGS: Right Kidney:  Length: 9.0 cm. Echogenicity within normal limits. No mass or hydronephrosis visualized.  Left Kidney:  Length: 9.9 cm. Echogenicity within normal limits. No mass or hydronephrosis visualized.  Bladder:  Not visualized, likely decompressed by a known Foley catheter  IMPRESSION: Study is moderately limited by patient body habitus but appears grossly normal.   Electronically Signed   By: Skipper Cliche M.D.   On: 12/15/2014 12:54       Assessment and plan per attending neurologist  Etta Quill PA-C [facility] [phone]  12/15/2014, 1:04 PM   Assessment/Plan:  59 yo F with abnormal movements precipitated by defacation. I suspect that this is an abnormal shiver reflex unmasked by her uremia. With bilateral involvement  and preserved consciousness, I do not think that seizure is at all likely. Given that keppra was started for this, I will stop this medication at this time. 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   1) D/C keppra 2) No further workup needed at this time for these movements. If they persist following treatment of uremia or if they become disabling, then this could be reconsidered.  3) Please call with any further questions or concerns.   Roland Rack, MD [facility] [phone]  If 7pm- 7am, please page neurology on call as listed in [place].

## 2014-12-15 NOTE — Progress Notes (Signed)
CRITICAL VALUE ALERT  Critical value received:  Positive blood cultures, aerobic bottle, Gram positive cocci in clusters  Date of notification:  12/15/2014  Time of notification:  1930  Critical value read back:Yes.    Nurse who received alert:  Dorene Grebe, RN  MD notified (1st page):  Lamar Blinks, NP  Time of first page:  2000  MD notified (2nd page):  Time of second page:  Responding MD:  Lamar Blinks  Time MD responded:  2003, orders received for vancomycin per pharmacy consult.

## 2014-12-15 NOTE — Progress Notes (Signed)
EEG Completed; Results Pending  

## 2014-12-15 NOTE — Progress Notes (Signed)
Nutrition Brief Note  Patient identified on the Malnutrition Screening Tool (MST) Report. Patient with 5% weight loss within 3 months, which is insignificant for the time frame.  Wt Readings from Last 15 Encounters:  12/14/14 327 lb 6.1 oz (148.5 kg)  12/13/14 332 lb (150.594 kg)  12/03/14 332 lb (150.594 kg)  10/27/14 344 lb (156.037 kg)  09/09/14 351 lb (159.213 kg)  07/23/14 344 lb (156.037 kg)  07/09/14 344 lb (156.037 kg)  06/11/14 344 lb (156.037 kg)  06/09/14 344 lb (156.037 kg)  05/06/14 345 lb (156.491 kg)  04/06/14 337 lb (152.862 kg)  01/08/14 329 lb (149.233 kg)  11/02/13 331 lb (150.141 kg)  10/23/13 338 lb (153.316 kg)  09/02/13 350 lb (158.759 kg)    Body mass index is 51.26 kg/(m^2). Patient meets criteria for class 3, extreme/morbid obesity based on current BMI.   Current diet order is clear liquids. Labs and medications reviewed.   No nutrition interventions warranted at this time. If nutrition issues arise, please consult RD.   Molli Barrows, RD, LDN, [place] Pager [phone] After Hours Pager [phone]

## 2014-12-15 NOTE — Procedures (Signed)
ELECTROENCEPHALOGRAM REPORT  Patient: Lauren Barber       Room #: 3S01 EEG No. ID: [id number] Age: 59 y.o.        Sex: female Referring Physician: RAI, R Report Date:  12/15/2014        Interpreting Physician: Anthony Sar  History: Lauren Barber is an 59 y.o. female history of hypertension, morbid obesity, diabetes mellitus with neuropathy, decubitus ulcer and stroke presenting with recurrent seizure like activity consisting of involuntary movements of lower extremity with spread to involve entire body.  Indications for study:  Rule out seizure disorder.  Technique: This is an 18 channel routine scalp EEG performed at the bedside with bipolar and monopolar montages arranged in accordance to the international 10/20 system of electrode placement.   Description: This EEG recording was performed during wakefulness and during light sleep. Background activity during wakefulness consisted of low amplitude diffuse mixed delta and theta activity symmetrically. Occasional runs of 8 Hz alpha rhythm were noted in history of head regions. Photic stimulation and hyperventilation were not performed. There was progressive slowing of activity during drowsiness and light sleep, with minimal spindle and K-complexes during states 2 of sleep. No epileptiform discharges were recorded during wakefulness nor during sleep.  Interpretation: This EEG is abnormal with mild localized nonspecific continuous slowing of cerebral activity. No evidence of an epileptic disorder was demonstrated. Lack of epileptiform activity during EEG recording does not rule out seizure disorder, however.   Rush Farmer M.D. [facility] [phone]

## 2014-12-15 NOTE — Consult Note (Signed)
[facility] Consult: 9:34 AM 12/15/2014  LOS: 1 day    Referring Provider: Dr Tana Coast Primary Care Physician:  Gildardo Cranker, DO Primary Gastroenterologist:  unassigned   Reason for Consultation:  FOBT + anemia.     HPI: Lauren Barber is a 59 y.o. female.  PMH: morbid obesity, IDDM type 2 with neuropathy, left side weakness post CVA.  Requires lift for bed to wheelchair transitions, non-ambulatory.  Hx Endometriosis.  Post menopausal bleeding. Anemia. Chronic constipation.   On Eliquis for DVT in 04/2014 which was scheduled to stop after 10/2014.    Admitted with multiple new onset seizure like activity at SNF where she [facility]. Hypotensive in ED with ? SIRS/Sepsis due to UTI.  CT head negative.  EEG in progress at present.  Labs reveal AKI, anemia.   Hgb is 7.1-7.5 on 4 assays since yesterday.  Was 9.7 in 06/2014. MCV 90.   FOBT is + (negative in 03/2012, 10/2013).  CT abdomen without contrast shows stool is rectosigmoid, gas in remaining proximal colon suggests obstipation/pseudo obstruction.  Mass vs prominent vascularity in subcarinal region.  She was transfused with one unit of blood.  No records of previous EGD, colonoscopy (called all local GI offices) though pt says she may have had upper endoscopy at [facility] hospital a # of years ago.   Background of chronic constipation, requires suppositories to have BMs.  Never sees blood or melena and previously FOBT negative.  Occasional heartburn, hiccups that respond to Tums.  No dysphagia.  In the last 5 to 7 days, she has had decreased appetite, decreased energy.  No n/v, no abdominal pain.  Stools watery at SNF on day of admission.  But the CT showed the obstipation, and she was given PR Dulcolax.  She is thirsty and c/o dry mouth      Past Medical History    Diagnosis Date  . Hypertension   . Obesities, morbid   . Vaginal bleeding   . Endometriosis   . Atopic conjunctivitis   . Diabetes mellitus without complication   . Cellulitis     legs  . Insomnia   . Cardiomegaly   . Gout     feet  . Anemia   . Stroke 2013  . CVA (cerebral vascular accident)   . Anxiety   . Headache(784.0)     otc meds prn  . Degenerative, intervertebral disc     back, legs  . History of blood transfusion [place] - unsure number of units transfused  . Neuromuscular disorder     diabetic neuropathy - feet  . Depressive disorder     depressive disorder    Past Surgical History  Procedure Laterality Date  . Cesarean section  1982, 1989    x 2  . Biopsy endometrial N/A 10 03 2013  . Dilation and curettage of uterus  2013  . Tonsillectomy    . Wisdom tooth extraction    . Hysteroscopy w/d&c N/A 06/28/2014    Procedure: DILATATION AND CURETTAGE /HYSTEROSCOPY;  Surgeon: Lavonia Drafts, MD;  Location: [facility];  Service: Gynecology;  Laterality: N/A;    Prior to Admission medications   Medication Sig Start Date End Date Taking? Authorizing Provider  amitriptyline (ELAVIL) 100 MG tablet Take 100 mg by mouth at bedtime.   Yes Historical Provider, MD  apixaban (ELIQUIS) 5 MG TABS tablet Take 5 mg by mouth 2 (two) times daily. For dvt   Yes Historical Provider, MD  FLUoxetine (PROZAC) 20 MG capsule Take 2 capsules (40 mg total) by mouth daily. 12/13/14  Yes Gerlene Fee, NP  furosemide (LASIX) 40 MG tablet Take 40 mg by mouth 2 (two) times daily.    Yes Historical Provider, MD  lisinopril (PRINIVIL,ZESTRIL) 5 MG tablet Take 5 mg by mouth daily. Hold for SBP <100   Yes Historical Provider, MD  allopurinol (ZYLOPRIM) 100 MG tablet Take 200 mg by mouth daily. 05/19/13   Tiffany L Reed, DO  diphenhydrAMINE (BENADRYL) 25 MG tablet Take 25 mg by mouth every 8 (eight) hours as needed for itching. For itching    Historical Provider, MD  EPINEPHrine  (EPIPEN) 0.3 mg/0.3 mL DEVI Inject 0.3 mg into the muscle once.    Historical Provider, MD  lactulose (CHRONULAC) 10 GM/15ML solution Take 10 g by mouth 2 (two) times daily. constipation    Historical Provider, MD  levETIRAcetam (KEPPRA) 250 MG tablet Take 1 tablet (250 mg total) by mouth 2 (two) times daily. 12/13/14 12/20/14  Gerlene Fee, NP  levETIRAcetam (KEPPRA) 500 MG tablet Take 1 tablet (500 mg total) by mouth 2 (two) times daily. 11/20/14   Gerlene Fee, NP  Melatonin 3 MG TABS Take 3 mg by mouth at bedtime.    Historical Provider, MD  Multiple Vitamins-Minerals (MULTIVITAMIN WITH MINERALS) tablet Take 1 tablet by mouth daily.    Historical Provider, MD  tiZANidine (ZANAFLEX) 4 MG tablet Take 4 mg by mouth every 8 (eight) hours as needed for muscle spasms.    Historical Provider, MD  traMADol (ULTRAM) 50 MG tablet Take 4 tablets (200 mg total) by mouth every 12 (twelve) hours. For pain 07/20/14   Blanchie Serve, MD  triamcinolone ointment (KENALOG) 0.1 % Apply 1 application topically daily as needed (to legs for dermatitis).    Historical Provider, MD    Scheduled Meds: . cefTRIAXone (ROCEPHIN)  IV  1 g Intravenous Daily  . pantoprazole (PROTONIX) IV  40 mg Intravenous Q12H  . sodium chloride  500 mL Intravenous Once  . sodium chloride  3 mL Intravenous Q12H   Infusions: . sodium chloride 100 mL/hr at 12/15/14 0000   PRN Meds: acetaminophen **OR** acetaminophen, ondansetron **OR** ondansetron (ZOFRAN) IV   Allergies as of 12/14/2014  . (No Known Allergies)    No family history on file.  History   Social History  . Marital Status: Single    Spouse Name: N/A    Number of Children: N/A  . Years of Education: N/A   Occupational History  . Not on file.   Social History Main Topics  . Smoking status: Former Smoker -- 0.50 packs/day for 43 years    Types: Cigarettes    Quit date: 06/11/2012  . Smokeless tobacco: Never Used  . Alcohol Use: No  . Drug Use: No  . Sexual  Activity: No   Other Topics Concern  . Not on file   Social History Narrative    REVIEW OF SYSTEMS: Constitutional:  Weigh drop of 5 # in last 11 days t  ENT:  No nose  bleeds Pulm:  No cough, no SOB CV:  No palpitations, no LE edema.  GU:  No hematuria, no frequency GI:  Per HPI Heme:  Only took po iron during pregnancy.  No unusual bleeding or bruising.  Transfusions:  Never transfused before now.    Neuro:  No headaches, no peripheral tingling or numbness.  Difficulty sleeping at night, day time sleepiness.  Derm:  No itching, no rash or sores.  Endocrine:  No sweats or chills.  No polyuria or dysuria Immunization:  Flu up to date.   Travel:  None beyond local counties in last few months.    PHYSICAL EXAM: Vital signs in last 24 hours: Filed Vitals:   12/15/14 0700  BP: 84/44  Pulse: 104  Temp: 98.1 F (36.7 C)  Resp: 17   Wt Readings from Last 3 Encounters:  12/14/14 327 lb 6.1 oz (148.5 kg)  12/13/14 332 lb (150.594 kg)  12/03/14 332 lb (150.594 kg)    General: morbidly obese, pickwickian appearing AAF.  Comfortable, drifts off to sleep frequently Head:  No assymetry or signs of trauma  Eyes:  No icterus or pallor Ears:  Not HOH  Nose:  No congestion or discharge Mouth:  Clear, dry MM.  Good dentitition Neck:  No mass, no  JVD Lungs:  Clear bil infront Heart: RRR.  No MRG.  S1/S2 audible Abdomen:  Obese, soft, no mass or HSM or hernia appreciated.  Not tender.  BS distant but active. .   Rectal: deferred   Musc/Skeltl: no joint swelling.  Contractures in left hand/fingers. Extremities:  No CCE  Neurologic:  Oriented x 3.  Somnolent but not difficult to re-arouse. Skin:  No rash or sores.  RN tells me she has mositure related skin breakdown in perineum/sacrum.  Tattoos:  Yes on arms Nodes:  No cervical adenopathy   Psych:  Cooperative, not anxious or agitated.   Intake/Output from previous day: 01/26 0701 - 01/27 0700 In: 385 [Blood:335; IV  Piggyback:50] Out: 525 [Urine:525] Intake/Output this shift:    LAB RESULTS:  Recent Labs  12/14/14 12/14/14 1815 12/14/14 2212 12/15/14 0758  WBC 10.4 9.1  --  9.5  HGB 7.6* 7.5* 7.1* 7.5*  HCT 22.8* 22.7* 21.6* 23.0*  PLT 356 339  --  299   BMET Lab Results  Component Value Date   NA 133* 12/15/2014   NA 133* 12/14/2014   NA 133* 12/14/2014   K 4.2 12/15/2014   K 4.5 12/14/2014   K 4.4 12/14/2014   CL 105 12/15/2014   CL 104 12/14/2014   CL 104 12/14/2014   CO2 15* 12/15/2014   CO2 16* 12/14/2014   CO2 17* 12/14/2014   GLUCOSE 81 12/15/2014   GLUCOSE 95 12/14/2014   GLUCOSE 79 12/14/2014   BUN 95* 12/15/2014   BUN 97* 12/14/2014   BUN 89* 12/14/2014   CREATININE 4.81* 12/15/2014   CREATININE 4.74* 12/14/2014   CREATININE 4.15* 12/14/2014   CALCIUM 9.2 12/15/2014   CALCIUM 9.7 12/14/2014   CALCIUM 9.2 12/14/2014   LFT  Recent Labs  12/14/14 12/14/14 1815 12/15/14 0758  PROT 7.3 8.4* 7.0  ALBUMIN 3.5 3.4* 3.0*  AST 14 16 14   ALT 13 15 13   ALKPHOS 111 112 98  BILITOT 0.3 0.4 0.6   PT/INR Lab Results  Component Value Date   INR 1.73* 12/15/2014   INR 1.72* 12/14/2014   INR 1.10 04/06/2012   Hepatitis Panel No results for input(s): HEPBSAG, HCVAB, HEPAIGM, HEPBIGM in  the last 72 hours. C-Diff No components found for: CDIFF Lipase  No results found for: LIPASE  Drugs of Abuse     Component Value Date/Time   LABOPIA NONE DETECTED 12/14/2014 2131   COCAINSCRNUR NONE DETECTED 12/14/2014 2131   LABBENZ NONE DETECTED 12/14/2014 2131   AMPHETMU NONE DETECTED 12/14/2014 2131   THCU NONE DETECTED 12/14/2014 2131   LABBARB NONE DETECTED 12/14/2014 2131     RADIOLOGY STUDIES: Ct Abdomen Pelvis Wo Contrast  12/15/2014   CLINICAL DATA:  Evaluate for bleed. Anemia and low back pain. Long-term anticoagulant use.  EXAM: CT ABDOMEN AND PELVIS WITHOUT CONTRAST  TECHNIQUE: Multidetector CT imaging of the abdomen and pelvis was performed following the  standard protocol without IV contrast.  COMPARISON:  10/24/2013  FINDINGS: Slight fibrosis in the lung bases. Prominent density demonstrated in the subcarinal region may represent mass or lymphadenopathy or could represent enlarged pulmonary artery. Small amount of pericardial fluid or thickening. Calcified granulomas adjacent to the EG junction region.  The unenhanced appearance of the liver, spleen, gallbladder, pancreas, adrenal glands, kidneys, abdominal aorta, inferior vena cava, and retroperitoneal lymph nodes is unremarkable. Stomach and small bowel are decompressed. Prominent stool-filled rectosigmoid colon with gaseous distention of the right hand transverse colon. This likely suggests constipation with pseudo-obstruction. No free air or free fluid in the abdomen. No abdominal or retroperitoneal hematomas demonstrated. Nonspecific vague infiltration in the visualize subcutaneous fat.  Pelvis: Bladder is decompressed. Uterus and ovaries are not enlarged. No free or loculated pelvic fluid collections. No pelvic mass or lymphadenopathy. No evidence of pelvic hematoma. Degenerative changes in the spine. Slight anterior subluxation of L4 on L5 is probably degenerative. No destructive bone lesions.  IMPRESSION: No evidence of abdominal, pelvic, or retroperitoneal hematoma. Prominent stool-filled rectosigmoid colon with gaseous distention of the remainder of the colon suggesting obstipation with pseudo-obstruction. Mass versus prominent vascularity in the subcarinal region.   Electronically Signed   By: Lucienne Capers M.D.   On: 12/15/2014 01:07   Dg Chest 2 View  12/14/2014   CLINICAL DATA:  Seizures  EXAM: CHEST  2 VIEW  COMPARISON:  None.  FINDINGS: There is no appreciable edema or consolidation. There is elevation of the left hemidiaphragm. Heart is mildly enlarged with pulmonary vascularity within normal limits. No pneumothorax. No adenopathy.  IMPRESSION: Elevation left hemidiaphragm. No edema or  consolidation. Mild cardiac enlargement.   Electronically Signed   By: Lowella Grip M.D.   On: 12/14/2014 19:03   Ct Head Wo Contrast  12/14/2014   CLINICAL DATA:  Seizure  EXAM: CT HEAD WITHOUT CONTRAST  TECHNIQUE: Contiguous axial images were obtained from the base of the skull through the vertex without intravenous contrast.  COMPARISON:  None.  FINDINGS: No evidence of parenchymal hemorrhage or extra-axial fluid collection. No mass lesion, mass effect, or midline shift.  No CT evidence of acute infarction.  Subcortical white matter and periventricular small vessel ischemic changes. Intracranial atherosclerosis.  Cerebral volume is within normal limits.  No ventriculomegaly.  Mild mucosal thickening in the left maxillary sinus. Mastoid air cells are clear.  No evidence of calvarial fracture.  IMPRESSION: No evidence of acute intracranial abnormality.  Small vessel ischemic changes with intracranial atherosclerosis.   Electronically Signed   By: Julian Hy M.D.   On: 12/14/2014 18:41    ENDOSCOPIC STUDIES: none  IMPRESSION:   *  FOBT+, normocytic anemia. S/p 1 unit PRBCsObstipation, possible pseudo-obstruction by CT scan.  Background of chronic constipation.    *  UTI, ? Sepsis/SIRS.  On Rocephin.   *  Morbid obesity, s/p CVA, bed/wheelchair bound, IDDM.      PLAN:     *  EGD and colonoscopy on 1/19.  Begin scheduled lactulose, enemas to clear significant stool burden.    Lauren Barber  12/15/2014, 9:34 AM Pager: [phone]   ________________________________________________________________________  Velora Heckler [facility] MD note:  I personally examined the patient, reviewed the data and agree with the assessment and plan described above.  Chronic anemia is multifactorial (chronic disease, CRI, FOBT + stool).  Will plan on colonoscopy +/- EGD to evaluate this as well as rule out structural cause of her chronic constipation.  Will be tough to prep given massive size, immobility,  chronic constipation so will plan for 2 day prep and procedures on Friday.  Will leave evaluation of ? Chest mass to primary service.   Lauren Loffler, MD [facility] Pager [phone]

## 2014-12-15 NOTE — Progress Notes (Signed)
Utilization Review Completed.Lauren Barber T1/27/2016  

## 2014-12-16 ENCOUNTER — Ambulatory Visit (HOSPITAL_COMMUNITY): Admission: RE | Admit: 2014-12-16 | Payer: Medicare Other | Source: Ambulatory Visit

## 2014-12-16 DIAGNOSIS — A419 Sepsis, unspecified organism: Secondary | ICD-10-CM | POA: Diagnosis not present

## 2014-12-16 LAB — COMPREHENSIVE METABOLIC PANEL
ALT: 14 U/L (ref 0–35)
AST: 14 U/L (ref 0–37)
Albumin: 2.8 g/dL — ABNORMAL LOW (ref 3.5–5.2)
Alkaline Phosphatase: 91 U/L (ref 39–117)
Anion gap: 9 (ref 5–15)
BILIRUBIN TOTAL: 0.4 mg/dL (ref 0.3–1.2)
BUN: 80 mg/dL — AB (ref 6–23)
CALCIUM: 9.2 mg/dL (ref 8.4–10.5)
CHLORIDE: 105 mmol/L (ref 96–112)
CO2: 20 mmol/L (ref 19–32)
Creatinine, Ser: 3.62 mg/dL — ABNORMAL HIGH (ref 0.50–1.10)
GFR, EST AFRICAN AMERICAN: 15 mL/min — AB (ref >90)
GFR, EST NON AFRICAN AMERICAN: 13 mL/min — AB (ref >90)
Glucose, Bld: 83 mg/dL (ref 70–99)
Potassium: 3.8 mmol/L (ref 3.5–5.1)
Sodium: 134 mmol/L — ABNORMAL LOW (ref 135–145)
Total Protein: 7 g/dL (ref 6.0–8.3)

## 2014-12-16 LAB — URINE MICROSCOPIC-ADD ON

## 2014-12-16 LAB — CBC
HCT: 26.5 % — ABNORMAL LOW (ref 36.0–46.0)
Hemoglobin: 8.7 g/dL — ABNORMAL LOW (ref 12.0–15.0)
MCH: 30.1 pg (ref 26.0–34.0)
MCHC: 32.8 g/dL (ref 30.0–36.0)
MCV: 91.7 fL (ref 78.0–100.0)
Platelets: 294 10*3/uL (ref 150–400)
RBC: 2.89 MIL/uL — ABNORMAL LOW (ref 3.87–5.11)
RDW: 15.4 % (ref 11.5–15.5)
WBC: 7 10*3/uL (ref 4.0–10.5)

## 2014-12-16 LAB — TYPE AND SCREEN
ABO/RH(D): O POS
Antibody Screen: NEGATIVE
Unit division: 0
Unit division: 0

## 2014-12-16 LAB — PHOSPHORUS: PHOSPHORUS: 3.8 mg/dL (ref 2.3–4.6)

## 2014-12-16 LAB — URINALYSIS, ROUTINE W REFLEX MICROSCOPIC
Bilirubin Urine: NEGATIVE
Glucose, UA: NEGATIVE mg/dL
Hgb urine dipstick: NEGATIVE
Ketones, ur: NEGATIVE mg/dL
Nitrite: NEGATIVE
PROTEIN: NEGATIVE mg/dL
Specific Gravity, Urine: 1.012 (ref 1.005–1.030)
UROBILINOGEN UA: 0.2 mg/dL (ref 0.0–1.0)
pH: 6 (ref 5.0–8.0)

## 2014-12-16 LAB — GLUCOSE, CAPILLARY: Glucose-Capillary: 112 mg/dL — ABNORMAL HIGH (ref 70–99)

## 2014-12-16 MED ORDER — BISACODYL 5 MG PO TBEC
5.0000 mg | DELAYED_RELEASE_TABLET | Freq: Four times a day (QID) | ORAL | Status: AC
  Administered 2014-12-16: 5 mg via ORAL
  Filled 2014-12-16 (×2): qty 1

## 2014-12-16 MED ORDER — CETYLPYRIDINIUM CHLORIDE 0.05 % MT LIQD
7.0000 mL | Freq: Two times a day (BID) | OROMUCOSAL | Status: DC
  Administered 2014-12-16 – 2014-12-18 (×3): 7 mL via OROMUCOSAL

## 2014-12-16 MED ORDER — SODIUM CHLORIDE 0.9 % IV SOLN
510.0000 mg | Freq: Once | INTRAVENOUS | Status: AC
  Administered 2014-12-16: 510 mg via INTRAVENOUS
  Filled 2014-12-16: qty 17

## 2014-12-16 MED ORDER — PEG-KCL-NACL-NASULF-NA ASC-C 100 G PO SOLR
1.0000 | Freq: Once | ORAL | Status: DC
  Filled 2014-12-16: qty 1

## 2014-12-16 MED ORDER — CHLORHEXIDINE GLUCONATE 0.12 % MT SOLN
15.0000 mL | Freq: Two times a day (BID) | OROMUCOSAL | Status: DC
  Administered 2014-12-16 – 2014-12-19 (×6): 15 mL via OROMUCOSAL
  Filled 2014-12-16 (×11): qty 15

## 2014-12-16 MED ORDER — PEG-KCL-NACL-NASULF-NA ASC-C 100 G PO SOLR
1.0000 | Freq: Once | ORAL | Status: AC
  Administered 2014-12-16: 200 g via ORAL
  Filled 2014-12-16: qty 1

## 2014-12-16 NOTE — Progress Notes (Signed)
Tap water enema administered, no complications. Will continue to monitor.

## 2014-12-16 NOTE — Progress Notes (Signed)
Subjective: Interval History: has no complaint  Except hungry.  Objective: Vital signs in last 24 hours: Temp:  [97.5 F (36.4 C)-98.5 F (36.9 C)] 97.9 F (36.6 C) (01/28 0805) Pulse Rate:  [86-96] 86 (01/28 0805) Resp:  [13-18] 16 (01/28 0805) BP: (99-123)/(49-71) 111/52 mmHg (01/28 0805) SpO2:  [97 %-100 %] 99 % (01/28 0805) Weight:  [153.4 kg (338 lb 3 oz)] 153.4 kg (338 lb 3 oz) (01/28 0445) Weight change: 47.712 kg (105 lb 3 oz)  Intake/Output from previous day: 01/27 0701 - 01/28 0700 In: 2546.7 [I.V.:1711.7; Blood:335; IV [id number]:782] Out: 2552 [Urine:2550; Stool:2] Intake/Output this shift: Total I/O In: 100 [I.V.:100] Out: 425 [Urine:425]  General appearance: alert, cooperative and morbidly obese Resp: diminished breath sounds bilaterally Cardio: S1, S2 normal and systolic murmur: holosystolic 2/6, blowing at apex GI: obese, pos bs, liver down 7 cm Extremities: edema 1+  Lab Results:  Recent Labs  12/15/14 2113 12/16/14 0428  WBC 8.9 7.0  HGB 8.4* 8.7*  HCT 25.7* 26.5*  PLT 298 294   BMET:  Recent Labs  12/15/14 0758 12/16/14 0428  NA 133* 134*  K 4.2 3.8  CL 105 105  CO2 15* 20  GLUCOSE 81 83  BUN 95* 80*  CREATININE 4.81* 3.62*  CALCIUM 9.2 9.2   No results for input(s): PTH in the last 72 hours. Iron Studies:  Recent Labs  12/15/14 1348  IRON 38*  TIBC 154*    Studies/Results: Ct Abdomen Pelvis Wo Contrast  12/15/2014   CLINICAL DATA:  Evaluate for bleed. Anemia and low back pain. Long-term anticoagulant use.  EXAM: CT ABDOMEN AND PELVIS WITHOUT CONTRAST  TECHNIQUE: Multidetector CT imaging of the abdomen and pelvis was performed following the standard protocol without IV contrast.  COMPARISON:  10/24/2013  FINDINGS: Slight fibrosis in the lung bases. Prominent density demonstrated in the subcarinal region may represent mass or lymphadenopathy or could represent enlarged pulmonary artery. Small amount of pericardial fluid or  thickening. Calcified granulomas adjacent to the EG junction region.  The unenhanced appearance of the liver, spleen, gallbladder, pancreas, adrenal glands, kidneys, abdominal aorta, inferior vena cava, and retroperitoneal lymph nodes is unremarkable. Stomach and small bowel are decompressed. Prominent stool-filled rectosigmoid colon with gaseous distention of the right hand transverse colon. This likely suggests constipation with pseudo-obstruction. No free air or free fluid in the abdomen. No abdominal or retroperitoneal hematomas demonstrated. Nonspecific vague infiltration in the visualize subcutaneous fat.  Pelvis: Bladder is decompressed. Uterus and ovaries are not enlarged. No free or loculated pelvic fluid collections. No pelvic mass or lymphadenopathy. No evidence of pelvic hematoma. Degenerative changes in the spine. Slight anterior subluxation of L4 on L5 is probably degenerative. No destructive bone lesions.  IMPRESSION: No evidence of abdominal, pelvic, or retroperitoneal hematoma. Prominent stool-filled rectosigmoid colon with gaseous distention of the remainder of the colon suggesting obstipation with pseudo-obstruction. Mass versus prominent vascularity in the subcarinal region.   Electronically Signed   By: Lucienne Capers M.D.   On: 12/15/2014 01:07   Dg Chest 2 View  12/14/2014   CLINICAL DATA:  Seizures  EXAM: CHEST  2 VIEW  COMPARISON:  None.  FINDINGS: There is no appreciable edema or consolidation. There is elevation of the left hemidiaphragm. Heart is mildly enlarged with pulmonary vascularity within normal limits. No pneumothorax. No adenopathy.  IMPRESSION: Elevation left hemidiaphragm. No edema or consolidation. Mild cardiac enlargement.   Electronically Signed   By: Lowella Grip M.D.   On: 12/14/2014 19:03  Ct Head Wo Contrast  12/14/2014   CLINICAL DATA:  Seizure  EXAM: CT HEAD WITHOUT CONTRAST  TECHNIQUE: Contiguous axial images were obtained from the base of the skull  through the vertex without intravenous contrast.  COMPARISON:  None.  FINDINGS: No evidence of parenchymal hemorrhage or extra-axial fluid collection. No mass lesion, mass effect, or midline shift.  No CT evidence of acute infarction.  Subcortical white matter and periventricular small vessel ischemic changes. Intracranial atherosclerosis.  Cerebral volume is within normal limits.  No ventriculomegaly.  Mild mucosal thickening in the left maxillary sinus. Mastoid air cells are clear.  No evidence of calvarial fracture.  IMPRESSION: No evidence of acute intracranial abnormality.  Small vessel ischemic changes with intracranial atherosclerosis.   Electronically Signed   By: Julian Hy M.D.   On: 12/14/2014 18:41   Ct Chest Wo Contrast  12/15/2014   CLINICAL DATA:  Concern for subcarinal mass on CT of the abdomen.  EXAM: CT CHEST WITHOUT CONTRAST  TECHNIQUE: Multidetector CT imaging of the chest was performed following the standard protocol without IV contrast.  COMPARISON:  CT abdomen 12/15/2014  FINDINGS: Mediastinum/Nodes: No axillary supraclavicular lymph adenopathy. No mediastinal lymphadenopathy. The mediastinal structures adjacent to the heart are not well evaluated without IV contrast. There is no clear mass in the subcarinal location. Findings likely represent the left atrium. No pericardial fluid. Esophagus is normal. There is bibasilar linear atelectasis.  Lungs/Pleura: There is linear atelectasis in the left lower lobe. No pleural nodularity. No pleural fluid or pulmonary edema.  Upper abdomen: Limited view of the liver, kidneys, pancreas are unremarkable. Normal adrenal glands.  Musculoskeletal: No acute osseous abnormality.  IMPRESSION: 1. While the mediastinal structures are difficult to evaluate without IV contrast, there does not appear to be a subcarinal mass and the findings on comparison CT likely represent the left atrium. 2. Left basilar atelectasis.   Electronically Signed   By: Suzy Bouchard M.D.   On: 12/15/2014 18:29   US Renal  12/15/2014   CLINICAL DATA:  Acute renal insufficiency, initial evaluation  EXAM: RENAL/URINARY TRACT ULTRASOUND COMPLETE  COMPARISON:  None.  FINDINGS: Right Kidney:  Length: 9.0 cm. Echogenicity within normal limits. No mass or hydronephrosis visualized.  Left Kidney:  Length: 9.9 cm. Echogenicity within normal limits. No mass or hydronephrosis visualized.  Bladder:  Not visualized, likely decompressed by a known Foley catheter  IMPRESSION: Study is moderately limited by patient body habitus but appears grossly normal.   Electronically Signed   By: Skipper Cliche M.D.   On: 12/15/2014 12:54    I have reviewed the patient's current medications.  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  Improving solute, acid/base.  Will cont iv Na bicarb.  Has G + in blood!!, may be cause of low bp.  Should not be on ACEI again. 2 HTN bp ok.,  ? Accuracy of bps 3 DM controlled 4 Morbid obesity 5 Constip 6 G+ sepsis ?? Decub as source 7 Decub 8 Hx DVT 9 GIB P AB, iv Na bicarb,     LOS: 2 days   Anela Bensman L 12/16/2014,8:45 AM

## 2014-12-16 NOTE — Progress Notes (Signed)
Pt foley removed per MD order. Pt tolerated procedure well. Will continue to monitor

## 2014-12-16 NOTE — Progress Notes (Signed)
Daily Rounding Note  12/16/2014, 12:19 PM  LOS: 2 days   SUBJECTIVE:       Has had about 2 to 3 stools in last 12 to 16 hours.  Refused the tap water enema last nightm but had one this AM: so far no results.    OBJECTIVE:         Vital signs in last 24 hours:    Temp:  [97.5 F (36.4 C)-98.3 F (36.8 C)] 98 F (36.7 C) (01/28 1200) Pulse Rate:  [86-94] 86 (01/28 0805) Resp:  [13-18] 16 (01/28 0805) BP: (99-123)/(52-71) 111/52 mmHg (01/28 0805) SpO2:  [97 %-100 %] 99 % (01/28 0805) Weight:  [338 lb 3 oz (153.4 kg)] 338 lb 3 oz (153.4 kg) (01/28 0445) Last BM Date: 12/15/14 Filed Weights   12/14/14 1657 12/14/14 2235 12/16/14 0445  Weight: 233 lb (105.688 kg) 327 lb 6.1 oz (148.5 kg) 338 lb 3 oz (153.4 kg)   General: pleasant, comfortable.  NAD   Heart: RRR Chest: clear bil Abdomen: obese, NT, ND  Extremities: obese Neuro/Psych:  Oriented x 3, relaxed.   Intake/Output from previous day: 01/27 0701 - 01/28 0700 In: 2546.7 [I.V.:1711.7; Blood:335; IV ALPFXTKWI:097] Out: 2552 [Urine:2550; Stool:2]  Intake/Output this shift: Total I/O In: 100 [I.V.:100] Out: 425 [Urine:425]  Lab Results:  Recent Labs  12/15/14 0758 12/15/14 2113 12/16/14 0428  WBC 9.5 8.9 7.0  HGB 7.5* 8.4* 8.7*  HCT 23.0* 25.7* 26.5*  PLT 299 298 294   BMET  Recent Labs  12/14/14 1815 12/15/14 0758 12/16/14 0428  NA 133* 133* 134*  K 4.5 4.2 3.8  CL 104 105 105  CO2 16* 15* 20  GLUCOSE 95 81 83  BUN 97* 95* 80*  CREATININE 4.74* 4.81* 3.62*  CALCIUM 9.7 9.2 9.2   LFT  Recent Labs  12/14/14 1815 12/15/14 0758 12/16/14 0428  PROT 8.4* 7.0 7.0  ALBUMIN 3.4* 3.0* 2.8*  AST 16 14 14   ALT 15 13 14   ALKPHOS 112 98 91  BILITOT 0.4 0.6 0.4   PT/INR  Recent Labs  12/14/14 2212 12/15/14 0758  LABPROT 20.3* 20.4*  INR 1.72* 1.73*   Hepatitis Panel No results for input(s): HEPBSAG, HCVAB, HEPAIGM, HEPBIGM in the  last 72 hours.  Studies/Results: Ct Abdomen Pelvis Wo Contrast  12/15/2014   CLINICAL DATA:  Evaluate for bleed. Anemia and low back pain. Long-term anticoagulant use.  EXAM: CT ABDOMEN AND PELVIS WITHOUT CONTRAST  TECHNIQUE: Multidetector CT imaging of the abdomen and pelvis was performed following the standard protocol without IV contrast.  COMPARISON:  10/24/2013  FINDINGS: Slight fibrosis in the lung bases. Prominent density demonstrated in the subcarinal region may represent mass or lymphadenopathy or could represent enlarged pulmonary artery. Small amount of pericardial fluid or thickening. Calcified granulomas adjacent to the EG junction region.  The unenhanced appearance of the liver, spleen, gallbladder, pancreas, adrenal glands, kidneys, abdominal aorta, inferior vena cava, and retroperitoneal lymph nodes is unremarkable. Stomach and small bowel are decompressed. Prominent stool-filled rectosigmoid colon with gaseous distention of the right hand transverse colon. This likely suggests constipation with pseudo-obstruction. No free air or free fluid in the abdomen. No abdominal or retroperitoneal hematomas demonstrated. Nonspecific vague infiltration in the visualize subcutaneous fat.  Pelvis: Bladder is decompressed. Uterus and ovaries are not enlarged. No free or loculated pelvic fluid collections. No pelvic mass or lymphadenopathy. No evidence of pelvic hematoma. Degenerative changes in the spine. Slight anterior  subluxation of L4 on L5 is probably degenerative. No destructive bone lesions.  IMPRESSION: No evidence of abdominal, pelvic, or retroperitoneal hematoma. Prominent stool-filled rectosigmoid colon with gaseous distention of the remainder of the colon suggesting obstipation with pseudo-obstruction. Mass versus prominent vascularity in the subcarinal region.   Electronically Signed   By: Lucienne Capers M.D.   On: 12/15/2014 01:07   Dg Chest 2 View  12/14/2014   CLINICAL DATA:  Seizures   EXAM: CHEST  2 VIEW  COMPARISON:  None.  FINDINGS: There is no appreciable edema or consolidation. There is elevation of the left hemidiaphragm. Heart is mildly enlarged with pulmonary vascularity within normal limits. No pneumothorax. No adenopathy.  IMPRESSION: Elevation left hemidiaphragm. No edema or consolidation. Mild cardiac enlargement.   Electronically Signed   By: Lowella Grip M.D.   On: 12/14/2014 19:03   Ct Head Wo Contrast  12/14/2014   CLINICAL DATA:  Seizure  EXAM: CT HEAD WITHOUT CONTRAST  TECHNIQUE: Contiguous axial images were obtained from the base of the skull through the vertex without intravenous contrast.  COMPARISON:  None.  FINDINGS: No evidence of parenchymal hemorrhage or extra-axial fluid collection. No mass lesion, mass effect, or midline shift.  No CT evidence of acute infarction.  Subcortical white matter and periventricular small vessel ischemic changes. Intracranial atherosclerosis.  Cerebral volume is within normal limits.  No ventriculomegaly.  Mild mucosal thickening in the left maxillary sinus. Mastoid air cells are clear.  No evidence of calvarial fracture.  IMPRESSION: No evidence of acute intracranial abnormality.  Small vessel ischemic changes with intracranial atherosclerosis.   Electronically Signed   By: Julian Hy M.D.   On: 12/14/2014 18:41   Ct Chest Wo Contrast  12/15/2014   CLINICAL DATA:  Concern for subcarinal mass on CT of the abdomen.  EXAM: CT CHEST WITHOUT CONTRAST  TECHNIQUE: Multidetector CT imaging of the chest was performed following the standard protocol without IV contrast.  COMPARISON:  CT abdomen 12/15/2014  FINDINGS: Mediastinum/Nodes: No axillary supraclavicular lymph adenopathy. No mediastinal lymphadenopathy. The mediastinal structures adjacent to the heart are not well evaluated without IV contrast. There is no clear mass in the subcarinal location. Findings likely represent the left atrium. No pericardial fluid. Esophagus is  normal. There is bibasilar linear atelectasis.  Lungs/Pleura: There is linear atelectasis in the left lower lobe. No pleural nodularity. No pleural fluid or pulmonary edema.  Upper abdomen: Limited view of the liver, kidneys, pancreas are unremarkable. Normal adrenal glands.  Musculoskeletal: No acute osseous abnormality.  IMPRESSION: 1. While the mediastinal structures are difficult to evaluate without IV contrast, there does not appear to be a subcarinal mass and the findings on comparison CT likely represent the left atrium. 2. Left basilar atelectasis.   Electronically Signed   By: Suzy Bouchard M.D.   On: 12/15/2014 18:29   US Renal  12/15/2014   CLINICAL DATA:  Acute renal insufficiency, initial evaluation  EXAM: RENAL/URINARY TRACT ULTRASOUND COMPLETE  COMPARISON:  None.  FINDINGS: Right Kidney:  Length: 9.0 cm. Echogenicity within normal limits. No mass or hydronephrosis visualized.  Left Kidney:  Length: 9.9 cm. Echogenicity within normal limits. No mass or hydronephrosis visualized.  Bladder:  Not visualized, likely decompressed by a known Foley catheter  IMPRESSION: Study is moderately limited by patient body habitus but appears grossly normal.   Electronically Signed   By: Skipper Cliche M.D.   On: 12/15/2014 12:54    ASSESMENT:   * FOBT+, normocytic anemia. S/p 1  unit PRBCs. S/p Feraheme infusion 1/28.   * Obstipation, possible pseudo-obstruction by CT scan. Background of chronic constipation.receiving Miralax, dulcolax po and tap water enemas.   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   * UTI, ? Sepsis/SIRS. On Rocephin. GPC clusters on 1 of 2 blood cultures.   * Morbid obesity, s/p CVA, bed/wheelchair bound, IDDM.     PLAN   * EGD/Colon on 1/29 at 10:30. needs a full movi prep starting now. May need additional full prep early tomorrow AM. Tap water enemas x 2 ordered for today.     Azucena Freed  12/16/2014, 12:19 PM Pager:  [phone]  ________________________________________________________________________  Velora Heckler GI MD note:  I personally examined the patient, reviewed the data and agree with the assessment and plan described above.   Owens Loffler, MD [facility] Pager [phone]

## 2014-12-16 NOTE — Progress Notes (Signed)
Pt refused enema this afternoon.

## 2014-12-16 NOTE — Progress Notes (Signed)
Bowel prep began and pt very hesitant to complete prep in time allotted. Education done on importance of medication and need for proceduer. Despite education pt continues to consume prep at a very delay rate. No bowel movement noted yet. Will continue to monitor.

## 2014-12-16 NOTE — Progress Notes (Signed)
Pt is agitated and yelling, "turn off my lights so I can sleep". Pt refusing enema at the moment. Will continue to monitor.

## 2014-12-16 NOTE — Progress Notes (Signed)
Patient ID: Lauren Barber  female  ONG:[id number]    DOB: [birth year]    DOA: 12/14/2014  PCP: Gildardo Cranker, DO  Brief history of present illness  Patient is a 59 year old female with hypertension, morbid obesity, diabetic neuropathy, diabetes mellitus, decub ulcer, CVA with left-sided chronic weakness, chronic anemia, ? Seizure history on Keppra presented with seizure-like episodes. Patient noted that she had 3 seizure-like episodes in the last 3 days and multiple episodes on the day of admission yesterday. Patient reported that the episodes started as tremor-like sensation in the leg and then spread to her body. First episode started when she was having a BM and then multiple episodes on the day of admission. She had an episode of incontinence once but no tongue biting. At the time of admission she was slightly confused. Patient was admitted for further workup. She was diagnosed with DVT in June 2015 and was scheduled to go off anticoagulation in December.  Assessment/Plan: Principal Problem:   SIRS (systemic inflammatory response syndrome) versus GPC sepsis contributing to UTI, decub ulcer - Placed on IV vancomycin, continue IV Rocephin. Unfortunately it appears that urine culture was not collected on admission. I have reordered it however due to antibiotic on board, it may be negative now.  - Continue to hold Lasix, lisinopril - Blood cultures 1/2 positive for GPC, follow sensitivities  Active Problems: Seizure/seizure-like activity - Neurology was consulted, patient was seen by Dr. Dr. Leonel Ramsay who discontinued Keppra and recommended no further workup, unlikely seizures.  - EEG was abnormal with mild localized nonspecific continuous slowing of cerebral activity, no evidence of epileptic disorder - CT head was negative for any intracranial abnormality  Acute renal failure: Baseline creatinine 1.3, admitted with creatinine of 4.1-> 4.7 likely worsened due to ATN versus UTI,  medications - Creatinine are improving 3.6, highly appreciate nephrology, Dr. Deterding's assistance - Continue to hold Lasix, lisinopril   Essential hypertension Currently stable however continue to hold Lasix, lisinopril    Acute on chronic Anemia - FOBT positive, on eliquis for anticoagulation  - CT abdomen and pelvis negative for any retroperitoneal hematoma.  - Stool occult test positive, received 2 units packed RBCs, hemoglobin stable 8.7  - GI consulted, recommended colonoscopy +/- EGD - No need for xarelto, patient has been on anti-coagulation beyond 3 months   Abnormal CT findings -  CT abdomen and pelvis showed incidentally prominent density in the subcarinal region representing mass or lymphadenopathy or enlarged pulmonary artery. Chest x-ray negative. - CT chest reports no subcarinal mass, finding on comparison CT likely represents the left atrium.   Sacral decub Wound care consult ordered  DVT Prophylaxis:SCDs   Code Status: full code   Family Communication: no family member at the bedside   Disposition:  Consultants Nephrology Gastroenterology Neurology  Procedures:  CT abd   CT chest  Renal ultrasound  Antibiotics:  None    Subjective:   Patient seen and examined, feeling better today more alert and awake and oriented, denies any pain, fevers or chills, nausea vomiting  Objective: Weight change: 47.712 kg (105 lb 3 oz)  Intake/Output Summary (Last 24 hours) at 12/16/14 1209 Last data filed at 12/16/14 4401  Gross per 24 hour  Intake 2296.66 ml  Output   2977 ml  Net -680.34 ml   Blood pressure 111/52, pulse 86, temperature 98 F (36.7 C), temperature source Oral, resp. rate 16, height 5\' 7"  (1.702 m), weight 153.4 kg (338 lb 3 oz), SpO2 99 %.  Physical  Exam: General: Alert and awake, oriented x3, not in any acute distress. HEENT: anicteric sclera, PERLA, EOMI CVS: S1-S2 clear, no murmur rubs or gallops Chest: clear to auscultation  bilaterally, no wheezing, rales or rhonchi Abdomen: morbidly obese,  soft nontender, nondistended, normal bowel sounds  Extremities: no cyanosis, clubbing or edema noted bilaterally Neuro:  chronic left-sided weakness Back: Stage I sacral decubitus, small area on back, right post thigh is not draining, another area on the left posterior thigh with some drainage   Results: Basic Metabolic Panel:  Recent Labs Lab 12/15/14 0758 12/16/14 0428  NA 133* 134*  K 4.2 3.8  CL 105 105  CO2 15* 20  GLUCOSE 81 83  BUN 95* 80*  CREATININE 4.81* 3.62*  CALCIUM 9.2 9.2  PHOS  --  3.8   Liver Function Tests:  Recent Labs Lab 12/15/14 0758 12/16/14 0428  AST 14 14  ALT 13 14  ALKPHOS 98 91  BILITOT 0.6 0.4  PROT 7.0 7.0  ALBUMIN 3.0* 2.8*   No results for input(s): LIPASE, AMYLASE in the last 168 hours.  Recent Labs Lab 12/14/14 2212  AMMONIA 38*   CBC:  Recent Labs Lab 12/15/14 0758 12/15/14 2113 12/16/14 0428  WBC 9.5 8.9 7.0  NEUTROABS 6.5  --   --   HGB 7.5* 8.4* 8.7*  HCT 23.0* 25.7* 26.5*  MCV 90.6 89.2 91.7  PLT 299 298 294   Cardiac Enzymes:  Recent Labs Lab 12/14/14 2212  CKTOTAL 175   BNP: Invalid input(s): POCBNP CBG:  Recent Labs Lab 12/15/14 0911 12/15/14 1623  GLUCAP 72 112*     Micro Results: Recent Results (from the past 240 hour(s))  Blood culture (routine x 2)     Status: None (Preliminary result)   Collection Time: 12/14/14  9:00 PM  Result Value Ref Range Status   Specimen Description BLOOD LEFT ARM  Final   Special Requests BOTTLES DRAWN AEROBIC AND ANAEROBIC 10CC  Final   Culture   Final           BLOOD CULTURE RECEIVED NO GROWTH TO DATE CULTURE WILL BE HELD FOR 5 DAYS BEFORE ISSUING A FINAL NEGATIVE REPORT Performed at [facility]    Report Status PENDING  Incomplete  Blood culture (routine x 2)     Status: None (Preliminary result)   Collection Time: 12/14/14  9:17 PM  Result Value Ref Range Status   Specimen  Description BLOOD LEFT HAND  Final   Special Requests BOTTLES DRAWN AEROBIC ONLY 6CC  Final   Culture   Final    GRAM POSITIVE COCCI IN CLUSTERS Note: Gram Stain Report Called to,Read Back By and Verified With: Carma Leaven RN [phone] Performed at [facility]    Report Status PENDING  Incomplete  MRSA PCR Screening     Status: Abnormal   Collection Time: 12/15/14  1:28 AM  Result Value Ref Range Status   MRSA by PCR POSITIVE (A) NEGATIVE Final    Comment:        The GeneXpert MRSA Assay (FDA approved for NASAL specimens only), is one component of a comprehensive MRSA colonization surveillance program. It is not intended to diagnose MRSA infection nor to guide or monitor treatment for MRSA infections. RESULT CALLED TO, READ BACK BY AND VERIFIED WITH: USSERY,K RN [id number] 12/15/14 MITCHELL,L     Studies/Results: Ct Abdomen Pelvis Wo Contrast  12/15/2014   CLINICAL DATA:  Evaluate for bleed. Anemia and low back pain. Long-term anticoagulant use.  EXAM: CT ABDOMEN AND PELVIS WITHOUT CONTRAST  TECHNIQUE: Multidetector CT imaging of the abdomen and pelvis was performed following the standard protocol without IV contrast.  COMPARISON:  10/24/2013  FINDINGS: Slight fibrosis in the lung bases. Prominent density demonstrated in the subcarinal region may represent mass or lymphadenopathy or could represent enlarged pulmonary artery. Small amount of pericardial fluid or thickening. Calcified granulomas adjacent to the EG junction region.  The unenhanced appearance of the liver, spleen, gallbladder, pancreas, adrenal glands, kidneys, abdominal aorta, inferior vena cava, and retroperitoneal lymph nodes is unremarkable. Stomach and small bowel are decompressed. Prominent stool-filled rectosigmoid colon with gaseous distention of the right hand transverse colon. This likely suggests constipation with pseudo-obstruction. No free air or free fluid in the abdomen. No abdominal or retroperitoneal  hematomas demonstrated. Nonspecific vague infiltration in the visualize subcutaneous fat.  Pelvis: Bladder is decompressed. Uterus and ovaries are not enlarged. No free or loculated pelvic fluid collections. No pelvic mass or lymphadenopathy. No evidence of pelvic hematoma. Degenerative changes in the spine. Slight anterior subluxation of L4 on L5 is probably degenerative. No destructive bone lesions.  IMPRESSION: No evidence of abdominal, pelvic, or retroperitoneal hematoma. Prominent stool-filled rectosigmoid colon with gaseous distention of the remainder of the colon suggesting obstipation with pseudo-obstruction. Mass versus prominent vascularity in the subcarinal region.   Electronically Signed   By: Lucienne Capers M.D.   On: 12/15/2014 01:07   Dg Chest 2 View  12/14/2014   CLINICAL DATA:  Seizures  EXAM: CHEST  2 VIEW  COMPARISON:  None.  FINDINGS: There is no appreciable edema or consolidation. There is elevation of the left hemidiaphragm. Heart is mildly enlarged with pulmonary vascularity within normal limits. No pneumothorax. No adenopathy.  IMPRESSION: Elevation left hemidiaphragm. No edema or consolidation. Mild cardiac enlargement.   Electronically Signed   By: Lowella Grip M.D.   On: 12/14/2014 19:03   Ct Head Wo Contrast  12/14/2014   CLINICAL DATA:  Seizure  EXAM: CT HEAD WITHOUT CONTRAST  TECHNIQUE: Contiguous axial images were obtained from the base of the skull through the vertex without intravenous contrast.  COMPARISON:  None.  FINDINGS: No evidence of parenchymal hemorrhage or extra-axial fluid collection. No mass lesion, mass effect, or midline shift.  No CT evidence of acute infarction.  Subcortical white matter and periventricular small vessel ischemic changes. Intracranial atherosclerosis.  Cerebral volume is within normal limits.  No ventriculomegaly.  Mild mucosal thickening in the left maxillary sinus. Mastoid air cells are clear.  No evidence of calvarial fracture.   IMPRESSION: No evidence of acute intracranial abnormality.  Small vessel ischemic changes with intracranial atherosclerosis.   Electronically Signed   By: Julian Hy M.D.   On: 12/14/2014 18:41   Ct Chest Wo Contrast  12/15/2014   CLINICAL DATA:  Concern for subcarinal mass on CT of the abdomen.  EXAM: CT CHEST WITHOUT CONTRAST  TECHNIQUE: Multidetector CT imaging of the chest was performed following the standard protocol without IV contrast.  COMPARISON:  CT abdomen 12/15/2014  FINDINGS: Mediastinum/Nodes: No axillary supraclavicular lymph adenopathy. No mediastinal lymphadenopathy. The mediastinal structures adjacent to the heart are not well evaluated without IV contrast. There is no clear mass in the subcarinal location. Findings likely represent the left atrium. No pericardial fluid. Esophagus is normal. There is bibasilar linear atelectasis.  Lungs/Pleura: There is linear atelectasis in the left lower lobe. No pleural nodularity. No pleural fluid or pulmonary edema.  Upper abdomen: Limited view of the liver, kidneys, pancreas  are unremarkable. Normal adrenal glands.  Musculoskeletal: No acute osseous abnormality.  IMPRESSION: 1. While the mediastinal structures are difficult to evaluate without IV contrast, there does not appear to be a subcarinal mass and the findings on comparison CT likely represent the left atrium. 2. Left basilar atelectasis.   Electronically Signed   By: Suzy Bouchard M.D.   On: 12/15/2014 18:29   US Renal  12/15/2014   CLINICAL DATA:  Acute renal insufficiency, initial evaluation  EXAM: RENAL/URINARY TRACT ULTRASOUND COMPLETE  COMPARISON:  None.  FINDINGS: Right Kidney:  Length: 9.0 cm. Echogenicity within normal limits. No mass or hydronephrosis visualized.  Left Kidney:  Length: 9.9 cm. Echogenicity within normal limits. No mass or hydronephrosis visualized.  Bladder:  Not visualized, likely decompressed by a known Foley catheter  IMPRESSION: Study is moderately  limited by patient body habitus but appears grossly normal.   Electronically Signed   By: Skipper Cliche M.D.   On: 12/15/2014 12:54    Medications: Scheduled Meds: . allopurinol  100 mg Oral Daily  . amitriptyline  100 mg Oral QHS  . antiseptic oral rinse  7 mL Mouth Rinse q12n4p  . cefTRIAXone (ROCEPHIN)  IV  1 g Intravenous Daily  . chlorhexidine  15 mL Mouth Rinse BID  . Chlorhexidine Gluconate Cloth  6 each Topical Q0600  . ferumoxytol  510 mg Intravenous Once  . FLUoxetine  40 mg Oral Daily  . lactulose  10 g Oral BID  . mupirocin ointment  1 application Nasal BID  . pantoprazole  40 mg Oral Q0600  . sodium chloride  500 mL Intravenous Once  . sodium chloride  3 mL Intravenous Q12H      LOS: 2 days   Swayze Pries M.D. [facility] 12/16/2014, 12:09 PM Pager: [phone]  If 7PM-7AM, please contact night-coverage [email or web address] Password [email or web address]

## 2014-12-16 NOTE — Consult Note (Signed)
WOC discussed buttock skin changes and inner thighs. After discussion and review of the chart it appears this patient has partial thickness skin damage from incontinence (MASD)moisture associated skin damage.  The bedside nursing staff have implemented the appropriate skin care for these areas with the use of zinc based barrier cream and foam dressings as needed.  Bedside nurses are aware to contact me should this area change or worsen.  Pt does have FC now to lessen exposure to the skin.    Re consult if needed, will not follow at this time. Thanks  Eisen Robenson [place], [place] [phone])

## 2014-12-17 ENCOUNTER — Encounter (HOSPITAL_COMMUNITY)
Admission: EM | Disposition: A | Discharge status: Skilled Nursing Facility | Payer: Self-pay | Source: Home / Self Care | Attending: Internal Medicine

## 2014-12-17 DIAGNOSIS — N179 Acute kidney failure, unspecified: Secondary | ICD-10-CM | POA: Insufficient documentation

## 2014-12-17 LAB — CBC
HEMATOCRIT: 26.8 % — AB (ref 36.0–46.0)
HEMOGLOBIN: 8.9 g/dL — AB (ref 12.0–15.0)
MCH: 29.9 pg (ref 26.0–34.0)
MCHC: 33.2 g/dL (ref 30.0–36.0)
MCV: 89.9 fL (ref 78.0–100.0)
Platelets: 309 10*3/uL (ref 150–400)
RBC: 2.98 MIL/uL — ABNORMAL LOW (ref 3.87–5.11)
RDW: 15.1 % (ref 11.5–15.5)
WBC: 7 10*3/uL (ref 4.0–10.5)

## 2014-12-17 LAB — RENAL FUNCTION PANEL
ANION GAP: 7 (ref 5–15)
Albumin: 2.9 g/dL — ABNORMAL LOW (ref 3.5–5.2)
BUN: 58 mg/dL — ABNORMAL HIGH (ref 6–23)
CHLORIDE: 105 mmol/L (ref 96–112)
CO2: 26 mmol/L (ref 19–32)
CREATININE: 2.51 mg/dL — AB (ref 0.50–1.10)
Calcium: 9.6 mg/dL (ref 8.4–10.5)
GFR calc Af Amer: 24 mL/min — ABNORMAL LOW (ref >90)
GFR calc non Af Amer: 20 mL/min — ABNORMAL LOW (ref >90)
Glucose, Bld: 95 mg/dL (ref 70–99)
POTASSIUM: 3.8 mmol/L (ref 3.5–5.1)
Phosphorus: 2.7 mg/dL (ref 2.3–4.6)
Sodium: 138 mmol/L (ref 135–145)

## 2014-12-17 LAB — URINE CULTURE
COLONY COUNT: NO GROWTH
Culture: NO GROWTH

## 2014-12-17 LAB — PARATHYROID HORMONE, INTACT (NO CA): PTH: 93 pg/mL — ABNORMAL HIGH (ref 15–65)

## 2014-12-17 SURGERY — EGD (ESOPHAGOGASTRODUODENOSCOPY)
Anesthesia: Monitor Anesthesia Care

## 2014-12-17 MED ORDER — DOCUSATE SODIUM 100 MG PO CAPS
100.0000 mg | ORAL_CAPSULE | Freq: Two times a day (BID) | ORAL | Status: DC
  Administered 2014-12-17 – 2014-12-19 (×5): 100 mg via ORAL
  Filled 2014-12-17 (×6): qty 1

## 2014-12-17 MED ORDER — SODIUM CHLORIDE 0.9 % IV SOLN
2000.0000 mg | Freq: Once | INTRAVENOUS | Status: AC
  Administered 2014-12-18: 2000 mg via INTRAVENOUS
  Filled 2014-12-17 (×2): qty 2000

## 2014-12-17 MED ORDER — POLYETHYLENE GLYCOL 3350 17 G PO PACK
17.0000 g | PACK | Freq: Two times a day (BID) | ORAL | Status: DC
  Filled 2014-12-17 (×4): qty 1

## 2014-12-17 MED ORDER — POLYETHYLENE GLYCOL 3350 17 G PO PACK: 17.0000 g | PACK | Freq: Every day | ORAL | Status: DC

## 2014-12-17 NOTE — Progress Notes (Signed)
Medicare Important Message given? YES  (If response is "NO", the following Medicare IM given date fields will be blank)  Date Medicare IM given: 12/17/14 Medicare IM given by:  Dahlia Client [employer]

## 2014-12-17 NOTE — Clinical Social Work Note (Signed)
CSW reviewed chart.  CSW has been working to identify the pt an alternate long-term care SNF. If CSW can not identify another SNF for the pt,once the pt is medically stable she can transition back to [facility]. CSW will continue to assist with discharge needs.   [place], MSW, [place]

## 2014-12-17 NOTE — Progress Notes (Signed)
[facility] Progress Note    Since last GI note: She could not, would not complete the prep last night.  Has had no overt bleeding  Objective: Vital signs in last 24 hours: Temp:  [96.8 F (36 C)-98 F (36.7 C)] 98 F (36.7 C) (01/29 0419) Pulse Rate:  [86-94] 92 (01/29 0419) Resp:  [11-20] 20 (01/29 0419) BP: (111-125)/(52-81) 124/67 mmHg (01/29 0419) SpO2:  [98 %-100 %] 100 % (01/29 0419) Last BM Date: 12/15/14 General: alert and oriented times 3 Heart: regular rate and rythm Abdomen: soft, non-tender, non-distended, normal bowel sounds   Lab Results:  Recent Labs  12/15/14 2113 12/16/14 0428 12/17/14 0306  WBC 8.9 7.0 7.0  HGB 8.4* 8.7* 8.9*  PLT 298 294 309  MCV 89.2 91.7 89.9    Recent Labs  12/15/14 0758 12/16/14 0428 12/17/14 0306  NA 133* 134* 138  K 4.2 3.8 3.8  CL 105 105 105  CO2 15* 20 26  GLUCOSE 81 83 95  BUN 95* 80* 58*  CREATININE 4.81* 3.62* 2.51*  CALCIUM 9.2 9.2 9.6    Recent Labs  12/14/14 1815 12/15/14 0758 12/16/14 0428 12/17/14 0306  PROT 8.4* 7.0 7.0  --   ALBUMIN 3.4* 3.0* 2.8* 2.9*  AST $Re'16 14 14  'Nka$ --   ALT $Re'15 13 14  'xOn$ --   ALKPHOS 112 98 91  --   BILITOT 0.4 0.6 0.4  --     Recent Labs  12/14/14 2212 12/15/14 0758  INR 1.72* 1.73*     Studies/Results: Ct Chest Wo Contrast  12/15/2014   CLINICAL DATA:  Concern for subcarinal mass on CT of the abdomen.  EXAM: CT CHEST WITHOUT CONTRAST  TECHNIQUE: Multidetector CT imaging of the chest was performed following the standard protocol without IV contrast.  COMPARISON:  CT abdomen 12/15/2014  FINDINGS: Mediastinum/Nodes: No axillary supraclavicular lymph adenopathy. No mediastinal lymphadenopathy. The mediastinal structures adjacent to the heart are not well evaluated without IV contrast. There is no clear mass in the subcarinal location. Findings likely represent the left atrium. No pericardial fluid. Esophagus is normal. There is bibasilar linear atelectasis.   Lungs/Pleura: There is linear atelectasis in the left lower lobe. No pleural nodularity. No pleural fluid or pulmonary edema.  Upper abdomen: Limited view of the liver, kidneys, pancreas are unremarkable. Normal adrenal glands.  Musculoskeletal: No acute osseous abnormality.  IMPRESSION: 1. While the mediastinal structures are difficult to evaluate without IV contrast, there does not appear to be a subcarinal mass and the findings on comparison CT likely represent the left atrium. 2. Left basilar atelectasis.   Electronically Signed   By: Suzy Bouchard M.D.   On: 12/15/2014 18:29   US Renal  12/15/2014   CLINICAL DATA:  Acute renal insufficiency, initial evaluation  EXAM: RENAL/URINARY TRACT ULTRASOUND COMPLETE  COMPARISON:  None.  FINDINGS: Right Kidney:  Length: 9.0 cm. Echogenicity within normal limits. No mass or hydronephrosis visualized.  Left Kidney:  Length: 9.9 cm. Echogenicity within normal limits. No mass or hydronephrosis visualized.  Bladder:  Not visualized, likely decompressed by a known Foley catheter  IMPRESSION: Study is moderately limited by patient body habitus but appears grossly normal.   Electronically Signed   By: Skipper Cliche M.D.   On: 12/15/2014 12:54     Medications: Scheduled Meds: . allopurinol  100 mg Oral Daily  . amitriptyline  100 mg Oral QHS  . antiseptic oral rinse  7 mL Mouth Rinse q12n4p  . cefTRIAXone (ROCEPHIN)  IV  1 g Intravenous Daily  . chlorhexidine  15 mL Mouth Rinse BID  . Chlorhexidine Gluconate Cloth  6 each Topical Q0600  . FLUoxetine  40 mg Oral Daily  . lactulose  10 g Oral BID  . mupirocin ointment  1 application Nasal BID  . pantoprazole  40 mg Oral Q0600  . peg 3350 powder  1 kit Oral Once  . sodium chloride  500 mL Intravenous Once  . sodium chloride  3 mL Intravenous Q12H   Continuous Infusions: . sodium chloride    .  sodium bicarbonate 150 mEq in sterile water 1000 mL infusion 50 mL/hr at 12/16/14 0956   PRN  Meds:.acetaminophen **OR** acetaminophen, diphenhydrAMINE, ondansetron **OR** ondansetron (ZOFRAN) IV, tiZANidine    Assessment/Plan: 59 y.o. female with multifactorial anemia  FOBT positive stool; I offered NG tube but she is not interested.  She understands we cannot proceed with colonoscopy unless she drinks the prep.  Since she is not overtly bleeding I do not think there is any urgency, emergency have the GI workup for her chronic anemia.  She has + blood cultures from UTI.  Perhaps it is best to simply allow her to recover from her acute illness (UTI, ? Seizures) and then come see me in my office in several weeks.  We will scheduled ROV, will ask that she have labs a few days prior.  Obviously if she has overt bleeding prior to then would probably need testing sooner.  Fortunately she will not be resuming her blood thinner for DVT since she completed the usual regimen.  For her chronic constipation, she should stay on lactulose $RemoveBefo'10mg'kKhMYkmaVlM$  twice daily.   Milus Banister, MD  12/17/2014, 7:58 AM [facility] Pager [phone]

## 2014-12-17 NOTE — Progress Notes (Signed)
Subjective: Interval History: has no complaint .  Objective: Vital signs in last 24 hours: Temp:  [96.8 F (36 C)-98.2 F (36.8 C)] 98.2 F (36.8 C) (01/29 0700) Pulse Rate:  [87-94] 92 (01/29 0419) Resp:  [11-20] 20 (01/29 0419) BP: (114-125)/(67-81) 124/67 mmHg (01/29 0419) SpO2:  [98 %-100 %] 100 % (01/29 0419) Weight change:   Intake/Output from previous day: 01/28 0701 - 01/29 0700 In: 2330 [P.O.:1680; I.V.:650] Out: 1025 [Urine:1025] Intake/Output this shift:    General appearance: alert, cooperative and morbidly obese Resp: diminished breath sounds bilaterally Cardio: S1, S2 normal GI: obese, pos bs, liver down 6 cm, soft Extremities: edema 1+  Lab Results:  Recent Labs  12/16/14 0428 12/17/14 0306  WBC 7.0 7.0  HGB 8.7* 8.9*  HCT 26.5* 26.8*  PLT 294 309   BMET:  Recent Labs  12/16/14 0428 12/17/14 0306  NA 134* 138  K 3.8 3.8  CL 105 105  CO2 20 26  GLUCOSE 83 95  BUN 80* 58*  CREATININE 3.62* 2.51*  CALCIUM 9.2 9.6   No results for input(s): PTH in the last 72 hours. Iron Studies:  Recent Labs  12/15/14 1348  IRON 38*  TIBC 154*    Studies/Results: Ct Chest Wo Contrast  12/15/2014   CLINICAL DATA:  Concern for subcarinal mass on CT of the abdomen.  EXAM: CT CHEST WITHOUT CONTRAST  TECHNIQUE: Multidetector CT imaging of the chest was performed following the standard protocol without IV contrast.  COMPARISON:  CT abdomen 12/15/2014  FINDINGS: Mediastinum/Nodes: No axillary supraclavicular lymph adenopathy. No mediastinal lymphadenopathy. The mediastinal structures adjacent to the heart are not well evaluated without IV contrast. There is no clear mass in the subcarinal location. Findings likely represent the left atrium. No pericardial fluid. Esophagus is normal. There is bibasilar linear atelectasis.  Lungs/Pleura: There is linear atelectasis in the left lower lobe. No pleural nodularity. No pleural fluid or pulmonary edema.  Upper abdomen:  Limited view of the liver, kidneys, pancreas are unremarkable. Normal adrenal glands.  Musculoskeletal: No acute osseous abnormality.  IMPRESSION: 1. While the mediastinal structures are difficult to evaluate without IV contrast, there does not appear to be a subcarinal mass and the findings on comparison CT likely represent the left atrium. 2. Left basilar atelectasis.   Electronically Signed   By: Suzy Bouchard M.D.   On: 12/15/2014 18:29   US Renal  12/15/2014   CLINICAL DATA:  Acute renal insufficiency, initial evaluation  EXAM: RENAL/URINARY TRACT ULTRASOUND COMPLETE  COMPARISON:  None.  FINDINGS: Right Kidney:  Length: 9.0 cm. Echogenicity within normal limits. No mass or hydronephrosis visualized.  Left Kidney:  Length: 9.9 cm. Echogenicity within normal limits. No mass or hydronephrosis visualized.  Bladder:  Not visualized, likely decompressed by a known Foley catheter  IMPRESSION: Study is moderately limited by patient body habitus but appears grossly normal.   Electronically Signed   By: Skipper Cliche M.D.   On: 12/15/2014 12:54    I have reviewed the patient's current medications. Prior to Admission:  Prescriptions prior to admission  Medication Sig Dispense Refill Last Dose  . acetaminophen (TYLENOL) 325 MG tablet Take 650 mg by mouth every 6 (six) hours as needed for mild pain.   unknown  . allopurinol (ZYLOPRIM) 100 MG tablet Take 200 mg by mouth daily.   12/15/2014 at Unknown time  . amitriptyline (ELAVIL) 100 MG tablet Take 100 mg by mouth at bedtime.   12/14/2014 at Unknown time  . apixaban (  ELIQUIS) 5 MG TABS tablet Take 5 mg by mouth 2 (two) times daily. For dvt   12/15/2014 at Unknown time  . diphenhydrAMINE (BENADRYL) 25 MG tablet Take 25 mg by mouth every 8 (eight) hours as needed for itching. For itching   unknown  . EPINEPHrine (EPIPEN) 0.3 mg/0.3 mL DEVI Inject 0.3 mg into the muscle once.   unknown  . FLUoxetine (PROZAC) 20 MG capsule Take 2 capsules (40 mg total) by  mouth daily. 120 capsule 11 12/15/2014 at Unknown time  . furosemide (LASIX) 40 MG tablet Take 40 mg by mouth 2 (two) times daily.    12/15/2014 at Unknown time  . lactulose (CHRONULAC) 10 GM/15ML solution Take 10 g by mouth 2 (two) times daily. constipation   12/15/2014 at Unknown time  . levETIRAcetam (KEPPRA) 250 MG tablet Take 1 tablet (250 mg total) by mouth 2 (two) times daily. 14 tablet 0 12/15/2014 at Unknown time  . levETIRAcetam (KEPPRA) 500 MG tablet Take 1 tablet (500 mg total) by mouth 2 (two) times daily. 120 tablet 11   . lisinopril (PRINIVIL,ZESTRIL) 5 MG tablet Take 5 mg by mouth daily. Hold for SBP <100   12/14/2014 at Unknown time  . Melatonin 3 MG TABS Take 3 mg by mouth at bedtime.   12/13/2014 at Unknown time  . methocarbamol (ROBAXIN) 500 MG tablet Take 500 mg by mouth every 8 (eight) hours as needed for muscle spasms.   12/15/2014 at Unknown time  . Multiple Vitamins-Minerals (MULTIVITAMIN WITH MINERALS) tablet Take 1 tablet by mouth daily.   12/15/2014 at Unknown time  . sodium chloride (OCEAN) 0.65 % SOLN nasal spray Place 1 spray into both nostrils as needed for congestion.   unknown  . tiZANidine (ZANAFLEX) 4 MG tablet Take 4 mg by mouth every 8 (eight) hours as needed for muscle spasms.   12/13/2014  . traMADol (ULTRAM) 50 MG tablet Take 4 tablets (200 mg total) by mouth every 12 (twelve) hours. For pain 240 tablet 5 12/15/2014 at Unknown time    Assessment/Plan: 1  AKI secondary to sepsis and GIB.  Improving. Vol, acid/base/K ok  .  Not clear where will plateau but is ok. 2 CKD small kidneys suspect Nephrosclerosis 3 Anemia given Fe. 4 Massive obesity 5 DM controlled 6 GIB per GI 7 Sepsis suspect decub 8 Hx DVT 9 SZ like activity suspect low bp 10CVA P stop ivf, cont AB , will s/o for now and see again at your request.    LOS: 3 days   Kennet Mccort L 12/17/2014,10:00 AM

## 2014-12-17 NOTE — Progress Notes (Signed)
Patient ID: Lauren Barber  female  PPI:[id number]    DOB: [birth year]    DOA: 12/14/2014  PCP: Gildardo Cranker, DO  Brief history of present illness  Patient is a 59 year old female with hypertension, morbid obesity, diabetic neuropathy, diabetes mellitus, decub ulcer, CVA with left-sided chronic weakness, chronic anemia, ? Seizure history on Keppra presented with seizure-like episodes. Patient noted that she had 3 seizure-like episodes in the last 3 days and multiple episodes on the day of admission yesterday. Patient reported that the episodes started as tremor-like sensation in the leg and then spread to her body. First episode started when she was having a BM and then multiple episodes on the day of admission. She had an episode of incontinence once but no tongue biting. At the time of admission she was slightly confused. Patient was admitted for further workup. She was diagnosed with DVT in June 2015 and was scheduled to go off anticoagulation in December.  Assessment/Plan: Principal Problem:   SIRS (systemic inflammatory response syndrome) versus GPC sepsis possibly from UTI, decub ulcer  - Placed on IV vancomycin, continue IV Rocephin. Urine culture is pending but was not collected on admission.  - Continue to hold Lasix, lisinopril - Blood cultures 1/2 positive for GPC, follow sensitivities, repeat blood cultures ordered  Active Problems: Seizure/seizure-like activity - Neurology was consulted, patient was seen by Dr. Dr. Leonel Ramsay who discontinued Keppra and recommended no further workup, unlikely seizures.  - EEG was abnormal with mild localized nonspecific continuous slowing of cerebral activity, no evidence of epileptic disorder - CT head was negative for any intracranial abnormality  Acute renal failure: Baseline creatinine 1.3, admitted with creatinine of 4.1-> 4.7 likely worsened due to ATN versus UTI, medications - Creatinine are improving 2.5 , highly appreciate nephrology,  Dr. Deterding's assistance - Continue to hold Lasix, lisinopril   Essential hypertension Currently stable however continue to hold Lasix, lisinopril    Acute on chronic Anemia - FOBT positive, on eliquis for anticoagulation  - CT abdomen and pelvis negative for any retroperitoneal hematoma.  - Stool occult test positive, received 2 units packed RBCs, hemoglobin stable 8.7  - GI consulted, recommended colonoscopy +/- EGD however patient has not been able to do the prep, for now outpatient follow-up and GI will consider procedures outpatient - No need for xarelto, patient has been on anti-coagulation beyond 3 months   Abnormal CT findings -  CT abdomen and pelvis showed incidentally prominent density in the subcarinal region representing mass or lymphadenopathy or enlarged pulmonary artery. Chest x-ray negative. - CT chest reports no subcarinal mass, finding on comparison CT likely represents the left atrium.   Sacral decub Wound care consult ordered  DVT Prophylaxis:SCDs   Code Status: full code   Family Communication: no family member at the bedside   Disposition: Transferred to telemetry  Consultants Nephrology Gastroenterology Neurology  Procedures:  CT abd   CT chest  Renal ultrasound  Antibiotics:  None    Subjective:   Patient seen and examined, no complaints, feeling better, no fevers or chills, BP stable. Overall improving but has not been able to do the prep for colonoscopy.   Objective: Weight change:   Intake/Output Summary (Last 24 hours) at 12/17/14 1254 Last data filed at 12/17/14 1100  Gross per 24 hour  Intake   1730 ml  Output      0 ml  Net   1730 ml   Blood pressure 114/77, pulse 91, temperature 98.2 F (36.8 C), temperature  source Oral, resp. rate 27, height $RemoveBe'5\' 7"'WzhTCeZmn$  (1.702 m), weight 153.4 kg (338 lb 3 oz), SpO2 100 %.  Physical Exam: General: Alert and awake, oriented x3, not in any acute distress. CVS: S1-S2 clear, no murmur rubs  or gallops Chest: clear to auscultation bilaterally, no wheezing, rales or rhonchi Abdomen: morbidly obese,  soft nontender, nondistended, normal bowel sounds  Extremities: no cyanosis, clubbing or edema noted bilaterally Neuro:  chronic left-sided weakness Back: Stage I sacral decubitus, small area on back, right post thigh is not draining, another area on the left posterior thigh with some drainage   Results: Basic Metabolic Panel:  Recent Labs Lab 12/16/14 0428 12/17/14 0306  NA 134* 138  K 3.8 3.8  CL 105 105  CO2 20 26  GLUCOSE 83 95  BUN 80* 58*  CREATININE 3.62* 2.51*  CALCIUM 9.2 9.6  PHOS 3.8 2.7   Liver Function Tests:  Recent Labs Lab 12/15/14 0758 12/16/14 0428 12/17/14 0306  AST 14 14  --   ALT 13 14  --   ALKPHOS 98 91  --   BILITOT 0.6 0.4  --   PROT 7.0 7.0  --   ALBUMIN 3.0* 2.8* 2.9*   No results for input(s): LIPASE, AMYLASE in the last 168 hours.  Recent Labs Lab 12/14/14 2212  AMMONIA 38*   CBC:  Recent Labs Lab 12/15/14 0758  12/16/14 0428 12/17/14 0306  WBC 9.5  < > 7.0 7.0  NEUTROABS 6.5  --   --   --   HGB 7.5*  < > 8.7* 8.9*  HCT 23.0*  < > 26.5* 26.8*  MCV 90.6  < > 91.7 89.9  PLT 299  < > 294 309  < > = values in this interval not displayed. Cardiac Enzymes:  Recent Labs Lab 12/14/14 2212  CKTOTAL 175   BNP: Invalid input(s): POCBNP CBG:  Recent Labs Lab 12/15/14 0911 12/15/14 1623  GLUCAP 72 112*     Micro Results: Recent Results (from the past 240 hour(s))  Blood culture (routine x 2)     Status: None (Preliminary result)   Collection Time: 12/14/14  9:00 PM  Result Value Ref Range Status   Specimen Description BLOOD LEFT ARM  Final   Special Requests BOTTLES DRAWN AEROBIC AND ANAEROBIC 10CC  Final   Culture   Final           BLOOD CULTURE RECEIVED NO GROWTH TO DATE CULTURE WILL BE HELD FOR 5 DAYS BEFORE ISSUING A FINAL NEGATIVE REPORT Performed at [facility]    Report Status PENDING   Incomplete  Blood culture (routine x 2)     Status: None (Preliminary result)   Collection Time: 12/14/14  9:17 PM  Result Value Ref Range Status   Specimen Description BLOOD LEFT HAND  Final   Special Requests BOTTLES DRAWN AEROBIC ONLY 6CC  Final   Culture   Final    GRAM POSITIVE COCCI IN CLUSTERS Note: Gram Stain Report Called to,Read Back By and Verified With: Carma Leaven RN [phone] Performed at [facility]    Report Status PENDING  Incomplete  MRSA PCR Screening     Status: Abnormal   Collection Time: 12/15/14  1:28 AM  Result Value Ref Range Status   MRSA by PCR POSITIVE (A) NEGATIVE Final    Comment:        The GeneXpert MRSA Assay (FDA approved for NASAL specimens only), is one component of a comprehensive MRSA colonization surveillance  program. It is not intended to diagnose MRSA infection nor to guide or monitor treatment for MRSA infections. RESULT CALLED TO, READ BACK BY AND VERIFIED WITH: USSERY,K RN [id number] 12/15/14 MITCHELL,L     Studies/Results: Ct Abdomen Pelvis Wo Contrast  12/15/2014   CLINICAL DATA:  Evaluate for bleed. Anemia and low back pain. Long-term anticoagulant use.  EXAM: CT ABDOMEN AND PELVIS WITHOUT CONTRAST  TECHNIQUE: Multidetector CT imaging of the abdomen and pelvis was performed following the standard protocol without IV contrast.  COMPARISON:  10/24/2013  FINDINGS: Slight fibrosis in the lung bases. Prominent density demonstrated in the subcarinal region may represent mass or lymphadenopathy or could represent enlarged pulmonary artery. Small amount of pericardial fluid or thickening. Calcified granulomas adjacent to the EG junction region.  The unenhanced appearance of the liver, spleen, gallbladder, pancreas, adrenal glands, kidneys, abdominal aorta, inferior vena cava, and retroperitoneal lymph nodes is unremarkable. Stomach and small bowel are decompressed. Prominent stool-filled rectosigmoid colon with gaseous distention of the right  hand transverse colon. This likely suggests constipation with pseudo-obstruction. No free air or free fluid in the abdomen. No abdominal or retroperitoneal hematomas demonstrated. Nonspecific vague infiltration in the visualize subcutaneous fat.  Pelvis: Bladder is decompressed. Uterus and ovaries are not enlarged. No free or loculated pelvic fluid collections. No pelvic mass or lymphadenopathy. No evidence of pelvic hematoma. Degenerative changes in the spine. Slight anterior subluxation of L4 on L5 is probably degenerative. No destructive bone lesions.  IMPRESSION: No evidence of abdominal, pelvic, or retroperitoneal hematoma. Prominent stool-filled rectosigmoid colon with gaseous distention of the remainder of the colon suggesting obstipation with pseudo-obstruction. Mass versus prominent vascularity in the subcarinal region.   Electronically Signed   By: Lucienne Capers M.D.   On: 12/15/2014 01:07   Dg Chest 2 View  12/14/2014   CLINICAL DATA:  Seizures  EXAM: CHEST  2 VIEW  COMPARISON:  None.  FINDINGS: There is no appreciable edema or consolidation. There is elevation of the left hemidiaphragm. Heart is mildly enlarged with pulmonary vascularity within normal limits. No pneumothorax. No adenopathy.  IMPRESSION: Elevation left hemidiaphragm. No edema or consolidation. Mild cardiac enlargement.   Electronically Signed   By: Lowella Grip M.D.   On: 12/14/2014 19:03   Ct Head Wo Contrast  12/14/2014   CLINICAL DATA:  Seizure  EXAM: CT HEAD WITHOUT CONTRAST  TECHNIQUE: Contiguous axial images were obtained from the base of the skull through the vertex without intravenous contrast.  COMPARISON:  None.  FINDINGS: No evidence of parenchymal hemorrhage or extra-axial fluid collection. No mass lesion, mass effect, or midline shift.  No CT evidence of acute infarction.  Subcortical white matter and periventricular small vessel ischemic changes. Intracranial atherosclerosis.  Cerebral volume is within normal  limits.  No ventriculomegaly.  Mild mucosal thickening in the left maxillary sinus. Mastoid air cells are clear.  No evidence of calvarial fracture.  IMPRESSION: No evidence of acute intracranial abnormality.  Small vessel ischemic changes with intracranial atherosclerosis.   Electronically Signed   By: Julian Hy M.D.   On: 12/14/2014 18:41   Ct Chest Wo Contrast  12/15/2014   CLINICAL DATA:  Concern for subcarinal mass on CT of the abdomen.  EXAM: CT CHEST WITHOUT CONTRAST  TECHNIQUE: Multidetector CT imaging of the chest was performed following the standard protocol without IV contrast.  COMPARISON:  CT abdomen 12/15/2014  FINDINGS: Mediastinum/Nodes: No axillary supraclavicular lymph adenopathy. No mediastinal lymphadenopathy. The mediastinal structures adjacent to the heart are not  well evaluated without IV contrast. There is no clear mass in the subcarinal location. Findings likely represent the left atrium. No pericardial fluid. Esophagus is normal. There is bibasilar linear atelectasis.  Lungs/Pleura: There is linear atelectasis in the left lower lobe. No pleural nodularity. No pleural fluid or pulmonary edema.  Upper abdomen: Limited view of the liver, kidneys, pancreas are unremarkable. Normal adrenal glands.  Musculoskeletal: No acute osseous abnormality.  IMPRESSION: 1. While the mediastinal structures are difficult to evaluate without IV contrast, there does not appear to be a subcarinal mass and the findings on comparison CT likely represent the left atrium. 2. Left basilar atelectasis.   Electronically Signed   By: Suzy Bouchard M.D.   On: 12/15/2014 18:29   US Renal  12/15/2014   CLINICAL DATA:  Acute renal insufficiency, initial evaluation  EXAM: RENAL/URINARY TRACT ULTRASOUND COMPLETE  COMPARISON:  None.  FINDINGS: Right Kidney:  Length: 9.0 cm. Echogenicity within normal limits. No mass or hydronephrosis visualized.  Left Kidney:  Length: 9.9 cm. Echogenicity within normal limits.  No mass or hydronephrosis visualized.  Bladder:  Not visualized, likely decompressed by a known Foley catheter  IMPRESSION: Study is moderately limited by patient body habitus but appears grossly normal.   Electronically Signed   By: Skipper Cliche M.D.   On: 12/15/2014 12:54    Medications: Scheduled Meds: . allopurinol  100 mg Oral Daily  . amitriptyline  100 mg Oral QHS  . antiseptic oral rinse  7 mL Mouth Rinse q12n4p  . cefTRIAXone (ROCEPHIN)  IV  1 g Intravenous Daily  . chlorhexidine  15 mL Mouth Rinse BID  . Chlorhexidine Gluconate Cloth  6 each Topical Q0600  . docusate sodium  100 mg Oral BID  . FLUoxetine  40 mg Oral Daily  . lactulose  10 g Oral BID  . mupirocin ointment  1 application Nasal BID  . pantoprazole  40 mg Oral Q0600  . peg 3350 powder  1 kit Oral Once  . polyethylene glycol  17 g Oral BID  . sodium chloride  500 mL Intravenous Once  . sodium chloride  3 mL Intravenous Q12H   Time spent 25 minutes   LOS: 3 days   Pegge Cumberledge M.D. [facility] 12/17/2014, 12:54 PM Pager: [phone]  If 7PM-7AM, please contact night-coverage [email or web address] Password [email or web address]

## 2014-12-17 NOTE — Progress Notes (Addendum)
ANTIBIOTIC CONSULT NOTE - INITIAL  Pharmacy Consult for vancomycin Indication: bacteremia  No Known Allergies  Patient Measurements: Height: 5\' 7"  (170.2 cm) Weight: (!) 338 lb 3 oz (153.4 kg) IBW/kg (Calculated) : 61.6   Vital Signs: Temp: 98.2 F (36.8 C) (01/29 0700) Temp Source: Oral (01/29 0700) BP: 114/77 mmHg (01/29 0855) Pulse Rate: 91 (01/29 0855) Intake/Output from previous day: 01/28 0701 - 01/29 0700 In: 2330 [P.O.:1680; I.V.:650] Out: 1025 [Urine:1025] Intake/Output from this shift: Total I/O In: 460 [P.O.:360; IV Piggyback:100] Out: -   Labs:  Recent Labs  12/15/14 0758 12/15/14 1501 12/15/14 2113 12/16/14 0428 12/17/14 0306  WBC 9.5  --  8.9 7.0 7.0  HGB 7.5*  --  8.4* 8.7* 8.9*  PLT 299  --  298 294 309  LABCREA  --  51.57  --   --   --   CREATININE 4.81*  --   --  3.62* 2.51*    Medical History: Past Medical History  Diagnosis Date  . Hypertension   . Obesities, morbid   . Vaginal bleeding   . Endometriosis   . Atopic conjunctivitis   . Diabetes mellitus without complication   . Cellulitis     legs  . Insomnia   . Cardiomegaly   . Gout     feet  . Anemia   . Stroke 2013  . CVA (cerebral vascular accident)   . Anxiety   . Headache(784.0)     otc meds prn  . Degenerative, intervertebral disc     back, legs  . History of blood transfusion [place] - unsure number of units transfused  . Neuromuscular disorder     diabetic neuropathy - feet  . Depressive disorder     depressive disorder    Medications:  Prescriptions prior to admission  Medication Sig Dispense Refill Last Dose  . acetaminophen (TYLENOL) 325 MG tablet Take 650 mg by mouth every 6 (six) hours as needed for mild pain.   unknown  . allopurinol (ZYLOPRIM) 100 MG tablet Take 200 mg by mouth daily.   12/15/2014 at Unknown time  . amitriptyline (ELAVIL) 100 MG tablet Take 100 mg by mouth at bedtime.   12/14/2014 at Unknown time  . apixaban (ELIQUIS) 5 MG  TABS tablet Take 5 mg by mouth 2 (two) times daily. For dvt   12/15/2014 at Unknown time  . diphenhydrAMINE (BENADRYL) 25 MG tablet Take 25 mg by mouth every 8 (eight) hours as needed for itching. For itching   unknown  . EPINEPHrine (EPIPEN) 0.3 mg/0.3 mL DEVI Inject 0.3 mg into the muscle once.   unknown  . FLUoxetine (PROZAC) 20 MG capsule Take 2 capsules (40 mg total) by mouth daily. 120 capsule 11 12/15/2014 at Unknown time  . furosemide (LASIX) 40 MG tablet Take 40 mg by mouth 2 (two) times daily.    12/15/2014 at Unknown time  . lactulose (CHRONULAC) 10 GM/15ML solution Take 10 g by mouth 2 (two) times daily. constipation   12/15/2014 at Unknown time  . levETIRAcetam (KEPPRA) 250 MG tablet Take 1 tablet (250 mg total) by mouth 2 (two) times daily. 14 tablet 0 12/15/2014 at Unknown time  . levETIRAcetam (KEPPRA) 500 MG tablet Take 1 tablet (500 mg total) by mouth 2 (two) times daily. 120 tablet 11   . lisinopril (PRINIVIL,ZESTRIL) 5 MG tablet Take 5 mg by mouth daily. Hold for SBP <100   12/14/2014 at Unknown time  . Melatonin 3 MG TABS  Take 3 mg by mouth at bedtime.   12/13/2014 at Unknown time  . methocarbamol (ROBAXIN) 500 MG tablet Take 500 mg by mouth every 8 (eight) hours as needed for muscle spasms.   12/15/2014 at Unknown time  . Multiple Vitamins-Minerals (MULTIVITAMIN WITH MINERALS) tablet Take 1 tablet by mouth daily.   12/15/2014 at Unknown time  . sodium chloride (OCEAN) 0.65 % SOLN nasal spray Place 1 spray into both nostrils as needed for congestion.   unknown  . tiZANidine (ZANAFLEX) 4 MG tablet Take 4 mg by mouth every 8 (eight) hours as needed for muscle spasms.   12/13/2014  . traMADol (ULTRAM) 50 MG tablet Take 4 tablets (200 mg total) by mouth every 12 (twelve) hours. For pain 240 tablet 5 12/15/2014 at Unknown time   Assessment: 59 yo F with + blood cultures for gram positive cocci in clusters on 12/14/14 BC.  Pharmacy consulted to dose vancomycin.  WBC WNL, afebrile. Pt with  significant AKI given Scr was normal in September, but improving since admission. SCr 4.8 > 2.5, pt making urine uop 0.7 yesterday.  Pt morbidly obese, wt 148.5 kg.  SIRS versus sepsis contributing to UTI.  Urine culture drawn after antibiotics were started. Spoke with micro re: blood cultures, 1 culture is final NGTD, the other has not speciated.   rocephin 1/27 >> Vancomycin 1/27 >>  1/26 BC: GPCocci in clusters in 1/2   Goal of Therapy:  Vancomycin trough level 15-20 mcg/ml  Plan:  -ceftriaxone 1 g IV q24h -vancomycin 2 g IV x1 -recommend VR in 48-72 hr, unless able to dc due to contamination  -f/u renal fxn, wbc, temp, culture results    Hughes Better, PharmD, BCPS Clinical Pharmacist Pager: [phone] 12/17/2014 1:15 PM

## 2014-12-17 NOTE — Progress Notes (Signed)
Patient  still has not finished the 1st of Moviprep states she can't drink all of it. Explained to her that they won't be able to perform scheduled procedure to find out if she's bleeding from her bowels. States she understands. MD made aware, states we need to cancel procedure.Will call Endo in a.m. To inform

## 2014-12-18 DIAGNOSIS — G8929 Other chronic pain: Secondary | ICD-10-CM

## 2014-12-18 LAB — CULTURE, BLOOD (ROUTINE X 2)

## 2014-12-18 LAB — BASIC METABOLIC PANEL
ANION GAP: 7 (ref 5–15)
BUN: 32 mg/dL — AB (ref 6–23)
CALCIUM: 9.7 mg/dL (ref 8.4–10.5)
CO2: 26 mmol/L (ref 19–32)
Chloride: 106 mmol/L (ref 96–112)
Creatinine, Ser: 1.7 mg/dL — ABNORMAL HIGH (ref 0.50–1.10)
GFR calc Af Amer: 38 mL/min — ABNORMAL LOW (ref >90)
GFR, EST NON AFRICAN AMERICAN: 33 mL/min — AB (ref >90)
GLUCOSE: 127 mg/dL — AB (ref 70–99)
Potassium: 3.8 mmol/L (ref 3.5–5.1)
Sodium: 139 mmol/L (ref 135–145)

## 2014-12-18 NOTE — Progress Notes (Signed)
Spoke with pt who confirms that she will be going back to [facility] at d/c.  She is not interested in pursuing placement at another facility.  CSW will continue to follow.

## 2014-12-18 NOTE — Progress Notes (Signed)
Patient ID: Lauren Barber  female  WLN:[id number]    DOB: [birth year]    DOA: 12/14/2014  PCP: Gildardo Cranker, DO  Brief history of present illness  Patient is a 59 year old female with hypertension, morbid obesity, diabetic neuropathy, diabetes mellitus, decub ulcer, CVA with left-sided chronic weakness, chronic anemia, ? Seizure history on Keppra presented with seizure-like episodes. Patient noted that she had 3 seizure-like episodes in the last 3 days and multiple episodes on the day of admission yesterday. Patient reported that the episodes started as tremor-like sensation in the leg and then spread to her body. First episode started when she was having a BM and then multiple episodes on the day of admission. She had an episode of incontinence once but no tongue biting. At the time of admission she was slightly confused. Patient was admitted for further workup. She was diagnosed with DVT in June 2015 and was scheduled to go off anticoagulation in December.  Assessment/Plan: Principal Problem:   SIRS (systemic inflammatory response syndrome) versus GPC sepsis possibly from UTI, decub ulcer  - Placed on IV vancomycin, continue IV Rocephin. Urine culture showed no growth but was not collected on admission.  - Continue to hold Lasix, lisinopril - Blood cultures 1/2 positive for GPC, sensitivities still pending, repeat blood cultures  NTD  Active Problems: Seizure/seizure-like activity - Neurology was consulted, patient was seen by Dr. Leonel Ramsay who discontinued Keppra and recommended no further workup, unlikely seizures.  - EEG was abnormal with mild localized nonspecific continuous slowing of cerebral activity, no evidence of epileptic disorder - CT head was negative for any intracranial abnormality  Acute renal failure: Baseline creatinine 1.3, admitted with creatinine of 4.1-> 4.7 likely worsened due to ATN versus UTI, medications - Creatinine improving, nephrology following - Continue  to hold Lasix, lisinopril   Essential hypertension Currently stable however continue to hold Lasix, lisinopril    Acute on chronic Anemia - FOBT positive, on eliquis for anticoagulation  - CT abdomen and pelvis negative for any retroperitoneal hematoma.  - Stool occult test positive, received 2 units packed RBCs, hemoglobin stable 8.7  - GI consulted, recommended colonoscopy +/- EGD however patient has not been able to do the prep, for now outpatient follow-up and GI will consider procedures outpatient - No need for xarelto, patient has been on anti-coagulation beyond 3 months   Abnormal CT findings -  CT abdomen and pelvis showed incidentally prominent density in the subcarinal region representing mass or lymphadenopathy or enlarged pulmonary artery. Chest x-ray negative. - CT chest reports no subcarinal mass, finding on comparison CT likely represents the left atrium.   Sacral decub Wound care consult done  DVT Prophylaxis:SCDs   Code Status: full code   Family Communication: no family member at the bedside   Disposition: Hopefully DC back to skilled nursing facility on Monday  Consultants Nephrology Gastroenterology Neurology  Procedures:  CT abd   CT chest  Renal ultrasound  Antibiotics:  None    Subjective:   Patient seen and examined, watching movie on her laptop, no complaints this morning   Objective: Weight change:   Intake/Output Summary (Last 24 hours) at 12/18/14 0937 Last data filed at 12/18/14 0843  Gross per 24 hour  Intake   1820 ml  Output      0 ml  Net   1820 ml   Blood pressure 128/69, pulse 80, temperature 97.6 F (36.4 C), temperature source Oral, resp. rate 18, height $RemoveBe'5\' 8"'YkpGFZGjj$  (1.727 m), weight 155.2 kg (  342 lb 2.5 oz), SpO2 100 %.  Physical Exam: General: Alert and awake, oriented x3, not in any acute distress. CVS: S1-S2 clear, no murmur rubs or gallops Chest: CTAB Abdomen: morbidly obese,  soft NT, ND, NBS Extremities: no  cyanosis, clubbing or edema noted bilaterally Neuro:  chronic left-sided weakness Back: Stage I sacral decubitus ulcers  Results: Basic Metabolic Panel:  Recent Labs Lab 12/16/14 0428 12/17/14 0306  NA 134* 138  K 3.8 3.8  CL 105 105  CO2 20 26  GLUCOSE 83 95  BUN 80* 58*  CREATININE 3.62* 2.51*  CALCIUM 9.2 9.6  PHOS 3.8 2.7   Liver Function Tests:  Recent Labs Lab 12/15/14 0758 12/16/14 0428 12/17/14 0306  AST 14 14  --   ALT 13 14  --   ALKPHOS 98 91  --   BILITOT 0.6 0.4  --   PROT 7.0 7.0  --   ALBUMIN 3.0* 2.8* 2.9*   No results for input(s): LIPASE, AMYLASE in the last 168 hours.  Recent Labs Lab 12/14/14 2212  AMMONIA 38*   CBC:  Recent Labs Lab 12/15/14 0758  12/16/14 0428 12/17/14 0306  WBC 9.5  < > 7.0 7.0  NEUTROABS 6.5  --   --   --   HGB 7.5*  < > 8.7* 8.9*  HCT 23.0*  < > 26.5* 26.8*  MCV 90.6  < > 91.7 89.9  PLT 299  < > 294 309  < > = values in this interval not displayed. Cardiac Enzymes:  Recent Labs Lab 12/14/14 2212  CKTOTAL 175   BNP: Invalid input(s): POCBNP CBG:  Recent Labs Lab 12/15/14 0911 12/15/14 1623  GLUCAP 72 112*     Micro Results: Recent Results (from the past 240 hour(s))  Blood culture (routine x 2)     Status: None (Preliminary result)   Collection Time: 12/14/14  9:00 PM  Result Value Ref Range Status   Specimen Description BLOOD LEFT ARM  Final   Special Requests BOTTLES DRAWN AEROBIC AND ANAEROBIC 10CC  Final   Culture   Final           BLOOD CULTURE RECEIVED NO GROWTH TO DATE CULTURE WILL BE HELD FOR 5 DAYS BEFORE ISSUING A FINAL NEGATIVE REPORT Performed at [facility]    Report Status PENDING  Incomplete  Blood culture (routine x 2)     Status: None (Preliminary result)   Collection Time: 12/14/14  9:17 PM  Result Value Ref Range Status   Specimen Description BLOOD LEFT HAND  Final   Special Requests BOTTLES DRAWN AEROBIC ONLY 6CC  Final   Culture   Final    GRAM POSITIVE  COCCI IN CLUSTERS Note: Gram Stain Report Called to,Read Back By and Verified With: Carma Leaven RN [phone] Performed at [facility]    Report Status PENDING  Incomplete  MRSA PCR Screening     Status: Abnormal   Collection Time: 12/15/14  1:28 AM  Result Value Ref Range Status   MRSA by PCR POSITIVE (A) NEGATIVE Final    Comment:        The GeneXpert MRSA Assay (FDA approved for NASAL specimens only), is one component of a comprehensive MRSA colonization surveillance program. It is not intended to diagnose MRSA infection nor to guide or monitor treatment for MRSA infections. RESULT CALLED TO, READ BACK BY AND VERIFIED WITH: USSERY,K RN [phone] 12/15/14 MITCHELL,L   Urine culture     Status: None  Collection Time: 12/16/14  2:49 PM  Result Value Ref Range Status   Specimen Description URINE, CATHETERIZED  Final   Special Requests NONE  Final   Colony Count NO GROWTH Performed at [facility]   Final   Culture NO GROWTH Performed at [facility]   Final   Report Status 12/17/2014 FINAL  Final    Studies/Results: Ct Abdomen Pelvis Wo Contrast  12/15/2014   CLINICAL DATA:  Evaluate for bleed. Anemia and low back pain. Long-term anticoagulant use.  EXAM: CT ABDOMEN AND PELVIS WITHOUT CONTRAST  TECHNIQUE: Multidetector CT imaging of the abdomen and pelvis was performed following the standard protocol without IV contrast.  COMPARISON:  10/24/2013  FINDINGS: Slight fibrosis in the lung bases. Prominent density demonstrated in the subcarinal region may represent mass or lymphadenopathy or could represent enlarged pulmonary artery. Small amount of pericardial fluid or thickening. Calcified granulomas adjacent to the EG junction region.  The unenhanced appearance of the liver, spleen, gallbladder, pancreas, adrenal glands, kidneys, abdominal aorta, inferior vena cava, and retroperitoneal lymph nodes is unremarkable. Stomach and small bowel are decompressed. Prominent  stool-filled rectosigmoid colon with gaseous distention of the right hand transverse colon. This likely suggests constipation with pseudo-obstruction. No free air or free fluid in the abdomen. No abdominal or retroperitoneal hematomas demonstrated. Nonspecific vague infiltration in the visualize subcutaneous fat.  Pelvis: Bladder is decompressed. Uterus and ovaries are not enlarged. No free or loculated pelvic fluid collections. No pelvic mass or lymphadenopathy. No evidence of pelvic hematoma. Degenerative changes in the spine. Slight anterior subluxation of L4 on L5 is probably degenerative. No destructive bone lesions.  IMPRESSION: No evidence of abdominal, pelvic, or retroperitoneal hematoma. Prominent stool-filled rectosigmoid colon with gaseous distention of the remainder of the colon suggesting obstipation with pseudo-obstruction. Mass versus prominent vascularity in the subcarinal region.   Electronically Signed   By: Burman Nieves M.D.   On: 12/15/2014 01:07   Dg Chest 2 View  12/14/2014   CLINICAL DATA:  Seizures  EXAM: CHEST  2 VIEW  COMPARISON:  None.  FINDINGS: There is no appreciable edema or consolidation. There is elevation of the left hemidiaphragm. Heart is mildly enlarged with pulmonary vascularity within normal limits. No pneumothorax. No adenopathy.  IMPRESSION: Elevation left hemidiaphragm. No edema or consolidation. Mild cardiac enlargement.   Electronically Signed   By: Bretta Bang M.D.   On: 12/14/2014 19:03   Ct Head Wo Contrast  12/14/2014   CLINICAL DATA:  Seizure  EXAM: CT HEAD WITHOUT CONTRAST  TECHNIQUE: Contiguous axial images were obtained from the base of the skull through the vertex without intravenous contrast.  COMPARISON:  None.  FINDINGS: No evidence of parenchymal hemorrhage or extra-axial fluid collection. No mass lesion, mass effect, or midline shift.  No CT evidence of acute infarction.  Subcortical white matter and periventricular small vessel ischemic  changes. Intracranial atherosclerosis.  Cerebral volume is within normal limits.  No ventriculomegaly.  Mild mucosal thickening in the left maxillary sinus. Mastoid air cells are clear.  No evidence of calvarial fracture.  IMPRESSION: No evidence of acute intracranial abnormality.  Small vessel ischemic changes with intracranial atherosclerosis.   Electronically Signed   By: Charline Bills M.D.   On: 12/14/2014 18:41   Ct Chest Wo Contrast  12/15/2014   CLINICAL DATA:  Concern for subcarinal mass on CT of the abdomen.  EXAM: CT CHEST WITHOUT CONTRAST  TECHNIQUE: Multidetector CT imaging of the chest was performed following the standard protocol  without IV contrast.  COMPARISON:  CT abdomen 12/15/2014  FINDINGS: Mediastinum/Nodes: No axillary supraclavicular lymph adenopathy. No mediastinal lymphadenopathy. The mediastinal structures adjacent to the heart are not well evaluated without IV contrast. There is no clear mass in the subcarinal location. Findings likely represent the left atrium. No pericardial fluid. Esophagus is normal. There is bibasilar linear atelectasis.  Lungs/Pleura: There is linear atelectasis in the left lower lobe. No pleural nodularity. No pleural fluid or pulmonary edema.  Upper abdomen: Limited view of the liver, kidneys, pancreas are unremarkable. Normal adrenal glands.  Musculoskeletal: No acute osseous abnormality.  IMPRESSION: 1. While the mediastinal structures are difficult to evaluate without IV contrast, there does not appear to be a subcarinal mass and the findings on comparison CT likely represent the left atrium. 2. Left basilar atelectasis.   Electronically Signed   By: Suzy Bouchard M.D.   On: 12/15/2014 18:29   US Renal  12/15/2014   CLINICAL DATA:  Acute renal insufficiency, initial evaluation  EXAM: RENAL/URINARY TRACT ULTRASOUND COMPLETE  COMPARISON:  None.  FINDINGS: Right Kidney:  Length: 9.0 cm. Echogenicity within normal limits. No mass or hydronephrosis  visualized.  Left Kidney:  Length: 9.9 cm. Echogenicity within normal limits. No mass or hydronephrosis visualized.  Bladder:  Not visualized, likely decompressed by a known Foley catheter  IMPRESSION: Study is moderately limited by patient body habitus but appears grossly normal.   Electronically Signed   By: Skipper Cliche M.D.   On: 12/15/2014 12:54    Medications: Scheduled Meds: . allopurinol  100 mg Oral Daily  . amitriptyline  100 mg Oral QHS  . antiseptic oral rinse  7 mL Mouth Rinse q12n4p  . cefTRIAXone (ROCEPHIN)  IV  1 g Intravenous Daily  . chlorhexidine  15 mL Mouth Rinse BID  . Chlorhexidine Gluconate Cloth  6 each Topical Q0600  . docusate sodium  100 mg Oral BID  . FLUoxetine  40 mg Oral Daily  . lactulose  10 g Oral BID  . mupirocin ointment  1 application Nasal BID  . pantoprazole  40 mg Oral Q0600  . peg 3350 powder  1 kit Oral Once  . sodium chloride  500 mL Intravenous Once  . sodium chloride  3 mL Intravenous Q12H  . vancomycin  2,000 mg Intravenous Once   Time spent 25 minutes   LOS: 4 days   RAI,RIPUDEEP M.D. [facility] 12/18/2014, 9:37 AM Pager: [phone]  If 7PM-7AM, please contact night-coverage [email or web address] Password [email or web address]

## 2014-12-19 LAB — BASIC METABOLIC PANEL
Anion gap: 4 — ABNORMAL LOW (ref 5–15)
BUN: 26 mg/dL — ABNORMAL HIGH (ref 6–23)
CO2: 27 mmol/L (ref 19–32)
Calcium: 9.5 mg/dL (ref 8.4–10.5)
Chloride: 108 mmol/L (ref 96–112)
Creatinine, Ser: 1.71 mg/dL — ABNORMAL HIGH (ref 0.50–1.10)
GFR calc Af Amer: 37 mL/min — ABNORMAL LOW (ref >90)
GFR calc non Af Amer: 32 mL/min — ABNORMAL LOW (ref >90)
GLUCOSE: 109 mg/dL — AB (ref 70–99)
Potassium: 4.3 mmol/L (ref 3.5–5.1)
SODIUM: 139 mmol/L (ref 135–145)

## 2014-12-19 MED ORDER — CEPHALEXIN 500 MG PO CAPS: 500.0000 mg | ORAL_CAPSULE | Freq: Two times a day (BID) | ORAL | Status: DC

## 2014-12-19 MED ORDER — DOCUSATE SODIUM 100 MG PO CAPS: 100.0000 mg | ORAL_CAPSULE | Freq: Two times a day (BID) | ORAL | Status: DC

## 2014-12-19 MED ORDER — PANTOPRAZOLE SODIUM 40 MG PO TBEC: 40.0000 mg | DELAYED_RELEASE_TABLET | Freq: Every day | ORAL | Status: DC

## 2014-12-19 NOTE — Discharge Summary (Signed)
Physician Discharge Summary  Patient ID: Lauren Barber MRN: [id number] DOB/AGE: 1956/08/11 59 y.o.  Admit date: 12/14/2014 Discharge date: 12/19/2014  Primary Care Physician:  Gildardo Cranker, DO  Discharge Diagnoses:    . SIRS (systemic inflammatory response syndrome) . Acute renal failure . Anemia . Chronic pain . Diabetes mellitus type 2, uncontrolled, without complications . Diabetic autonomic neuropathy associated with type 2 diabetes mellitus . Essential hypertension . history of DVT (deep venous thrombosis) . Constipation  Consults:   Gastroenterology, Dr. Owens Loffler Nephrology, Dr. Jimmy Footman Neurology, Dr. Leonel Ramsay   Recommendations for Outpatient Follow-up:  Patient needs good wound care for the stage I decubitus on the buttocks and posterior thighs, frequent turning, diaper changing every 2 hours or swelling  TESTS THAT NEED FOLLOW-UP Please check CBC, BMET at the facility in 3 days  Please note XARELTO has been discontinued, patient was on anticoagulation beyond 3 months  Please hold Lasix for another 3 days, repeat BMET. if creatinine function close to baseline normal or less than 1.3 , Lasix can be restarted at 20 mg daily PRN for shortness of breath or peripheral edema and titrate  Lisinopril is discontinued.  Please note neurology has discontinued Keppra. Patient can have outpatient neurology follow-up.   DIET: Carb modified diet    Allergies:  No Known Allergies   Discharge Medications:   Medication List    STOP taking these medications        acetaminophen 325 MG tablet  Commonly known as:  TYLENOL     apixaban 5 MG Tabs tablet  Commonly known as:  ELIQUIS     furosemide 40 MG tablet  Commonly known as:  LASIX     levETIRAcetam 250 MG tablet  Commonly known as:  KEPPRA     levETIRAcetam 500 MG tablet  Commonly known as:  KEPPRA     lisinopril 5 MG tablet  Commonly known as:  PRINIVIL,ZESTRIL      TAKE these medications         allopurinol 100 MG tablet  Commonly known as:  ZYLOPRIM  Take 200 mg by mouth daily.     amitriptyline 100 MG tablet  Commonly known as:  ELAVIL  Take 100 mg by mouth at bedtime.     cephALEXin 500 MG capsule  Commonly known as:  KEFLEX  Take 1 capsule (500 mg total) by mouth 2 (two) times daily. X 3 more days     diphenhydrAMINE 25 MG tablet  Commonly known as:  BENADRYL  Take 25 mg by mouth every 8 (eight) hours as needed for itching. For itching     docusate sodium 100 MG capsule  Commonly known as:  COLACE  Take 1 capsule (100 mg total) by mouth 2 (two) times daily.     EPIPEN 0.3 mg/0.3 mL Devi  Generic drug:  EPINEPHrine  Inject 0.3 mg into the muscle once.     FLUoxetine 20 MG capsule  Commonly known as:  PROZAC  Take 2 capsules (40 mg total) by mouth daily.     lactulose 10 GM/15ML solution  Commonly known as:  CHRONULAC  Take 10 g by mouth 2 (two) times daily. constipation     Melatonin 3 MG Tabs  Take 3 mg by mouth at bedtime.     methocarbamol 500 MG tablet  Commonly known as:  ROBAXIN  Take 500 mg by mouth every 8 (eight) hours as needed for muscle spasms.     multivitamin with minerals tablet  Take 1  tablet by mouth daily.     pantoprazole 40 MG tablet  Commonly known as:  PROTONIX  Take 1 tablet (40 mg total) by mouth daily.     sodium chloride 0.65 % Soln nasal spray  Commonly known as:  OCEAN  Place 1 spray into both nostrils as needed for congestion.     tiZANidine 4 MG tablet  Commonly known as:  ZANAFLEX  Take 4 mg by mouth every 8 (eight) hours as needed for muscle spasms.     traMADol 50 MG tablet  Commonly known as:  ULTRAM  Take 4 tablets (200 mg total) by mouth every 12 (twelve) hours. For pain         Brief H and P: For complete details please refer to admission H and P, but in brief Patient is a 59 year old female with hypertension, morbid obesity, diabetic neuropathy, diabetes mellitus, decub ulcer, CVA with left-sided  chronic weakness, chronic anemia, ? Seizure history on Keppra presented with seizure-like episodes. Patient noted that she had 3 seizure-like episodes in the last 3 days and multiple episodes on the day of admission yesterday. Patient reported that the episodes started as tremor-like sensation in the leg and then spread to her body. First episode started when she was having a BM and then multiple episodes on the day of admission. She had an episode of incontinence once but no tongue biting. At the time of admission she was slightly confused. Patient was admitted for further workup. She was diagnosed with DVT in June 2015 and was scheduled to go off anticoagulation in December.  Hospital Course:   SIRS (systemic inflammatory response syndrome) possibly from UTI, decub ulcer patient was placed on IV vancomycin and IV Rocephin. Unfortunately urine culture was not collected at admission. Blood cultures showed 1/2 positive for gram-positive cocci. Final cultures on 1/31 showed coagulase-negative staph, likely contaminant. Vancomycin was discontinued. Patient will complete Keflex for 3 more days to complete full course for UTI for 1 week.   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 Patient needs to have syncopal based barrier cream and for dressings.   Seizure/seizure-like activity - Neurology was consulted, patient was seen by Dr. Leonel Ramsay who discontinued Keppra and recommended no further workup, unlikely seizures. Per neurology, abnormal movements best treated by defecation likely abnormal shiver reflex unmasked by the uremia, does not appear to be seizure. 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 EEG was abnormal with mild localized nonspecific  continuous slowing of cerebral activity however no evidence of epileptic disorder. CT head was negative for any intracranial abnormality  Acute renal failure: Baseline creatinine 1.3, admitted with creatinine of 4.1-> 4.7 likely worsened due to ATN versus UTI, medications. The patient was placed on IV fluids. Nephrology was consulted. Renal ultrasound was limited however grossly normal with no hydronephrosis or obstruction. Please continue to hold Lasix, lisinopril has been discontinued. Please check BMET in 3 days.  Essential hypertension Currently stable however continue to hold Lasix, lisinopril  Acute on chronic Anemia - FOBT positive,  patient was on eliquis for anticoagulation for DVT, she was scheduled to stop anticoagulation in December. CT abdomen and pelvis was negative for any retroperitoneal hematoma. Stool occult test positive, received 2 units packed RBCs, hemoglobin stable 8.9. GI was  consulted and patient was seen by Dr. Ardis Hughs who recommendedcolonoscopy +/- EGD however patient was not able to do the prep. GI recommended , for now outpatient follow-up and procedures outpatient on evaluation. No need for xarelto, patient has been on anti-coagulation beyond 3 months   Abnormal CT findings - CT abdomen and pelvis showed incidentally prominent density in the subcarinal region representing mass or lymphadenopathy or enlarged pulmonary artery. Chest x-ray negative. - CT chest reports no subcarinal mass, finding on comparison CT likely represents the left atrium.    Day of Discharge BP 119/64 mmHg  Pulse 84  Temp(Src) 98.2 F (36.8 C) (Oral)  Resp 20  Ht 5\' 8"  (1.727 m)  Wt 137.893 kg (304 lb)  BMI 46.23 kg/m2  SpO2 100%  Physical Exam: General: Alert and awake oriented x3 not in any acute distress. CVS: S1-S2 clear no murmur rubs or gallops Chest: clear to auscultation bilaterally, no wheezing rales or rhonchi Abdomen:  morbidly obese soft nontender, nondistended,  normal bowel sounds Extremities: no cyanosis, clubbing or edema noted bilaterally Neuro; chronic left-sided weakness Stage I sacral decub ulcers   The results of significant diagnostics from this hospitalization (including imaging, microbiology, ancillary and laboratory) are listed below for reference.    LAB RESULTS: Basic Metabolic Panel:  Recent Labs Lab 12/17/14 0306 12/18/14 0934 12/19/14 0329  NA 138 139 139  K 3.8 3.8 4.3  CL 105 106 108  CO2 26 26 27   GLUCOSE 95 127* 109*  BUN 58* 32* 26*  CREATININE 2.51* 1.70* 1.71*  CALCIUM 9.6 9.7 9.5  PHOS 2.7  --   --    Liver Function Tests:  Recent Labs Lab 12/15/14 0758 12/16/14 0428 12/17/14 0306  AST 14 14  --   ALT 13 14  --   ALKPHOS 98 91  --   BILITOT 0.6 0.4  --   PROT 7.0 7.0  --   ALBUMIN 3.0* 2.8* 2.9*   No results for input(s): LIPASE, AMYLASE in the last 168 hours.  Recent Labs Lab 12/14/14 2212  AMMONIA 38*   CBC:  Recent Labs Lab 12/15/14 0758  12/16/14 0428 12/17/14 0306  WBC 9.5  < > 7.0 7.0  NEUTROABS 6.5  --   --   --   HGB 7.5*  < > 8.7* 8.9*  HCT 23.0*  < > 26.5* 26.8*  MCV 90.6  < > 91.7 89.9  PLT 299  < > 294 309  < > = values in this interval not displayed. Cardiac Enzymes:  Recent Labs Lab 12/14/14 2212  CKTOTAL 175   BNP: Invalid input(s): POCBNP CBG:  Recent Labs Lab 12/15/14 0911 12/15/14 1623  GLUCAP 72 112*    Significant Diagnostic Studies:  Ct Abdomen Pelvis Wo Contrast  12/15/2014   CLINICAL DATA:  Evaluate for bleed. Anemia and low back pain. Long-term anticoagulant use.  EXAM: CT ABDOMEN AND PELVIS WITHOUT CONTRAST  TECHNIQUE: Multidetector CT imaging of the abdomen and pelvis was performed following the standard protocol without IV contrast.  COMPARISON:  10/24/2013  FINDINGS: Slight fibrosis in the lung bases. Prominent density demonstrated in the subcarinal region may represent mass or lymphadenopathy or could represent enlarged pulmonary artery.  Small amount of pericardial fluid or thickening. Calcified granulomas adjacent to the EG junction region.  The unenhanced appearance of the liver, spleen, gallbladder, pancreas, adrenal glands, kidneys, abdominal aorta, inferior vena cava, and retroperitoneal lymph nodes is unremarkable. Stomach and small bowel are decompressed. Prominent stool-filled rectosigmoid colon with  gaseous distention of the right hand transverse colon. This likely suggests constipation with pseudo-obstruction. No free air or free fluid in the abdomen. No abdominal or retroperitoneal hematomas demonstrated. Nonspecific vague infiltration in the visualize subcutaneous fat.  Pelvis: Bladder is decompressed. Uterus and ovaries are not enlarged. No free or loculated pelvic fluid collections. No pelvic mass or lymphadenopathy. No evidence of pelvic hematoma. Degenerative changes in the spine. Slight anterior subluxation of L4 on L5 is probably degenerative. No destructive bone lesions.  IMPRESSION: No evidence of abdominal, pelvic, or retroperitoneal hematoma. Prominent stool-filled rectosigmoid colon with gaseous distention of the remainder of the colon suggesting obstipation with pseudo-obstruction. Mass versus prominent vascularity in the subcarinal region.   Electronically Signed   By: Lucienne Capers M.D.   On: 12/15/2014 01:07   Dg Chest 2 View  12/14/2014   CLINICAL DATA:  Seizures  EXAM: CHEST  2 VIEW  COMPARISON:  None.  FINDINGS: There is no appreciable edema or consolidation. There is elevation of the left hemidiaphragm. Heart is mildly enlarged with pulmonary vascularity within normal limits. No pneumothorax. No adenopathy.  IMPRESSION: Elevation left hemidiaphragm. No edema or consolidation. Mild cardiac enlargement.   Electronically Signed   By: Lowella Grip M.D.   On: 12/14/2014 19:03   Ct Head Wo Contrast  12/14/2014   CLINICAL DATA:  Seizure  EXAM: CT HEAD WITHOUT CONTRAST  TECHNIQUE: Contiguous axial images were  obtained from the base of the skull through the vertex without intravenous contrast.  COMPARISON:  None.  FINDINGS: No evidence of parenchymal hemorrhage or extra-axial fluid collection. No mass lesion, mass effect, or midline shift.  No CT evidence of acute infarction.  Subcortical white matter and periventricular small vessel ischemic changes. Intracranial atherosclerosis.  Cerebral volume is within normal limits.  No ventriculomegaly.  Mild mucosal thickening in the left maxillary sinus. Mastoid air cells are clear.  No evidence of calvarial fracture.  IMPRESSION: No evidence of acute intracranial abnormality.  Small vessel ischemic changes with intracranial atherosclerosis.   Electronically Signed   By: Julian Hy M.D.   On: 12/14/2014 18:41   Ct Chest Wo Contrast  12/15/2014   CLINICAL DATA:  Concern for subcarinal mass on CT of the abdomen.  EXAM: CT CHEST WITHOUT CONTRAST  TECHNIQUE: Multidetector CT imaging of the chest was performed following the standard protocol without IV contrast.  COMPARISON:  CT abdomen 12/15/2014  FINDINGS: Mediastinum/Nodes: No axillary supraclavicular lymph adenopathy. No mediastinal lymphadenopathy. The mediastinal structures adjacent to the heart are not well evaluated without IV contrast. There is no clear mass in the subcarinal location. Findings likely represent the left atrium. No pericardial fluid. Esophagus is normal. There is bibasilar linear atelectasis.  Lungs/Pleura: There is linear atelectasis in the left lower lobe. No pleural nodularity. No pleural fluid or pulmonary edema.  Upper abdomen: Limited view of the liver, kidneys, pancreas are unremarkable. Normal adrenal glands.  Musculoskeletal: No acute osseous abnormality.  IMPRESSION: 1. While the mediastinal structures are difficult to evaluate without IV contrast, there does not appear to be a subcarinal mass and the findings on comparison CT likely represent the left atrium. 2. Left basilar atelectasis.    Electronically Signed   By: Suzy Bouchard M.D.   On: 12/15/2014 18:29   US Renal  12/15/2014   CLINICAL DATA:  Acute renal insufficiency, initial evaluation  EXAM: RENAL/URINARY TRACT ULTRASOUND COMPLETE  COMPARISON:  None.  FINDINGS: Right Kidney:  Length: 9.0 cm. Echogenicity within normal limits. No mass or  hydronephrosis visualized.  Left Kidney:  Length: 9.9 cm. Echogenicity within normal limits. No mass or hydronephrosis visualized.  Bladder:  Not visualized, likely decompressed by a known Foley catheter  IMPRESSION: Study is moderately limited by patient body habitus but appears grossly normal.   Electronically Signed   By: Skipper Cliche M.D.   On: 12/15/2014 12:54    2D ECHO:   Disposition and Follow-up: Discharge Instructions    Diet - low sodium heart healthy    Complete by:  As directed      Increase activity slowly    Complete by:  As directed             [facility] Follow-up Information    Follow up with Gildardo Cranker, DO. Schedule an appointment as soon as possible for a visit in 2 weeks.   Specialty:  Internal Medicine   Why:  for hospital follow-up   Contact information:   [place] [id number] [phone]       Follow up with Milus Banister, MD. Schedule an appointment as soon as possible for a visit in 2 weeks.   Specialty:  Gastroenterology   Why:  for hospital follow-up   Contact information:   [address] [address] [phone]        Time spent on Discharge: 40 minutes   Signed:   Ashey Tramontana M.D. [facility] 12/19/2014, 10:25 AM Pager: [phone]

## 2014-12-19 NOTE — Progress Notes (Signed)
Pt being dc back to facility, [facility], report called, pt stable, leaving via ambulance

## 2014-12-20 ENCOUNTER — Non-Acute Institutional Stay (SKILLED_NURSING_FACILITY): Payer: Medicare Other | Admitting: Adult Health

## 2014-12-20 ENCOUNTER — Encounter: Payer: Self-pay | Admitting: Adult Health

## 2014-12-20 DIAGNOSIS — E1165 Type 2 diabetes mellitus with hyperglycemia: Secondary | ICD-10-CM

## 2014-12-20 DIAGNOSIS — IMO0001 Reserved for inherently not codable concepts without codable children: Secondary | ICD-10-CM

## 2014-12-20 DIAGNOSIS — M109 Gout, unspecified: Secondary | ICD-10-CM | POA: Diagnosis not present

## 2014-12-20 DIAGNOSIS — G8929 Other chronic pain: Secondary | ICD-10-CM

## 2014-12-20 DIAGNOSIS — K5901 Slow transit constipation: Secondary | ICD-10-CM

## 2014-12-20 DIAGNOSIS — I10 Essential (primary) hypertension: Secondary | ICD-10-CM | POA: Diagnosis not present

## 2014-12-20 NOTE — Progress Notes (Signed)
Patient ID: Lauren Barber, female   DOB: [birth year], 59 y.o.   MRN: [id number]  [name]     No Known Allergies     Chief Complaint  Patient presents with  . Hospitalization Follow-up    HPI:  She is a long term resident of this facility who has been hospitalized for acute renal failure with a creat of 4.74; new onset seizure like activity and sirs.  It was determined that she was not having seizures and her keppra was stopped. Her xarelto was stopped as she completed her therapy. Her lisinopril and lasix were stopped due to her renal function. She is happy to be home.    Past Medical History  Diagnosis Date  . Hypertension   . Obesities, morbid   . Vaginal bleeding   . Endometriosis   . Atopic conjunctivitis   . Diabetes mellitus without complication   . Cellulitis     legs  . Insomnia   . Cardiomegaly   . Gout     feet  . Anemia   . Stroke 2013  . CVA (cerebral vascular accident)   . Anxiety   . Headache(784.0)     otc meds prn  . Degenerative, intervertebral disc     back, legs  . History of blood transfusion [facility] - unsure number of units transfused  . Neuromuscular disorder     diabetic neuropathy - feet  . Depressive disorder     depressive disorder    Past Surgical History  Procedure Laterality Date  . Cesarean section  1982, 1989    x 2  . Biopsy endometrial N/A 10 03 2013  . Dilation and curettage of uterus  2013  . Tonsillectomy    . Wisdom tooth extraction    . Hysteroscopy w/d&c N/A 06/28/2014    Procedure: DILATATION AND CURETTAGE /HYSTEROSCOPY;  Surgeon: Lavonia Drafts, MD;  Location: [facility];  Service: Gynecology;  Laterality: N/A;    VITAL SIGNS BP 98/67 mmHg  Pulse 79  Ht 5\' 9"  (1.753 m)  Wt 332 lb (150.594 kg)  BMI 49.01 kg/m2  SpO2 98%   Outpatient Encounter Prescriptions as of 12/20/2014  Medication Sig  . allopurinol (ZYLOPRIM) 100 MG tablet Take 200 mg by mouth daily.  Marland Kitchen amitriptyline  (ELAVIL) 100 MG tablet Take 100 mg by mouth at bedtime.  . cephALEXin (KEFLEX) 500 MG capsule Take 1 capsule (500 mg total) by mouth 2 (two) times daily. X 3 more days  . diphenhydrAMINE (BENADRYL) 25 MG tablet Take 25 mg by mouth every 8 (eight) hours as needed for itching. For itching  . docusate sodium (COLACE) 100 MG capsule Take 1 capsule (100 mg total) by mouth 2 (two) times daily.  Marland Kitchen EPINEPHrine (EPIPEN) 0.3 mg/0.3 mL DEVI Inject 0.3 mg into the muscle once.  Marland Kitchen FLUoxetine (PROZAC) 20 MG capsule Take 2 capsules (40 mg total) by mouth daily.  Marland Kitchen lactulose (CHRONULAC) 10 GM/15ML solution Take 10 g by mouth 2 (two) times daily. constipation  . Melatonin 3 MG TABS Take 3 mg by mouth at bedtime.  . methocarbamol (ROBAXIN) 500 MG tablet Take 500 mg by mouth every 8 (eight) hours as needed for muscle spasms.  . Multiple Vitamins-Minerals (MULTIVITAMIN WITH MINERALS) tablet Take 1 tablet by mouth daily.  . pantoprazole (PROTONIX) 40 MG tablet Take 1 tablet (40 mg total) by mouth daily.  . sodium chloride (OCEAN) 0.65 % SOLN nasal spray Place 1 spray into both nostrils as  needed for congestion.  Marland Kitchen tiZANidine (ZANAFLEX) 4 MG tablet Take 4 mg by mouth every 8 (eight) hours as needed for muscle spasms.  . traMADol (ULTRAM) 50 MG tablet Take 4 tablets (200 mg total) by mouth every 12 (twelve) hours. For pain     SIGNIFICANT DIAGNOSTIC EXAMS  12-14-14 ct of head: No evidence of acute intracranial abnormality. Small vessel ischemic changes with intracranial atherosclerosis.  12-14-14: chest x-ray: Elevation left hemidiaphragm. No edema or consolidation. Mild cardiac enlargement.  12-15-14: ct of abdomen and pelvis: No evidence of abdominal, pelvic, or retroperitoneal hematoma. Prominent stool-filled rectosigmoid colon with gaseous distention of the remainder of the colon suggesting obstipation with pseudo-obstruction. Mass versus prominent vascularity in the subcarinal region.   12-15-14: renal  ultrasound: Study is moderately limited by patient body habitus but appears grossly normal.   12-15-14: ct of chest: 1. While the mediastinal structures are difficult to evaluate without IV contrast, there does not appear to be a subcarinal mass and the findings on comparison CT likely represent the left atrium. 2. Left basilar atelectasis.  12-15-14: EEG: This EEG is abnormal with mild localized nonspecific continuous slowing of cerebral activity. No evidence of an epileptic disorder was demonstrated. Lack of epileptiform activity during EEG recording does not rule out seizure disorder, however      LABS REVIEWED:   07-12-14: wbc 6.0; hgb 9.8; hct 29.9 ;mcv 93.4; lt 285;  07-30-14: glucose 146; bun 29; creat 1.3; k+4.5 ;na++ 141 09-10-14: hgb a1c 6.4 10-25-14: hgb a1c 6.5 10-29-14: chol 146; ldl 89; trig 127; hdl 32; urine micro-albumin <2.0 11-03-14: wbc 9.5; hgb 8.1; hct 25.3; mcv 96.;3 plt 268 12-14-14: wbc 1.4; hgb 7.6; hgb 22.8; mcv 93.1 plt 356; glucose 79; bun 89; creat 4.15; k+4.4 ;na++133; liver normal albumin 3.5; tsh 4.786; vit b12: 1064; folate 15.5 12-14-14: (ED)wbc 9.1; hgb 7.5; hct 22.2; mcv 91.5; plt 339; glucose 95; bun 97; creat 4.74; k+4.5 na++133; liver normal albumin 3.4; tsh 3.898; ammonia 38 12-15-14: wbc 9.8; hgb 7.5; hct 23.0; mcv 90.6; plt 299; glucose 81; bun 95; creat 4.81; k+4.2; na++133; liver normal albumin 3.0; uric acid 53; LDH 124; iron 38; tibc 154 PTH 93 12-16-14: urine culture no growth  12-17-14: wbc 7.0; hgb 8.9; hct 26.8; mcv 89.9; plt 309 12-19-14: glucose 109; bun 26; creat 1.71; k+4.3; na++139      ROS Constitutional: Negative for malaise/fatigue.  Respiratory: Negative for cough and shortness of breath.   Cardiovascular: Negative for chest pain, palpitations and leg swelling.  Gastrointestinal: Negative for heartburn and abdominal pain.  Musculoskeletal: Positive for myalgias. Negative for joint pain.       Is managed   Skin: Negative.     Psychiatric/Behavioral: Negative for depression. The patient is not nervous/anxious.     Physical Exam Constitutional: She is oriented to person, place, and time. No distress.  Morbidly obese   Neck: Neck supple. No JVD present.  Cardiovascular: Normal rate, regular rhythm and intact distal pulses.   Respiratory: Effort normal and breath sounds normal. No respiratory distress. She has no wheezes.  GI: Soft. Bowel sounds are normal. She exhibits no distension. There is no tenderness.  Musculoskeletal: She exhibits no edema.  Is able to move all extremities; with limitations due to obesity; has left hand contracture.   Neurological: She is alert and oriented to person, place, and time.  cn grossly intact  Skin: Skin is warm. She is not diaphoretic    ASSESSMENT/ PLAN:  1.  Gout: no  recent flares will continue allopurinol 200 mg daily will monitor  2. DVT: she has completed her xarelto therapy will monitor   3. Chronic pain: her pain is being managed with her current regimen will continue ultram 200 mg twice daily; elavil 100 mg nightly; zanaflex 4 mg every 8 hours as needed robaxin 500 mg every 8 hours as needed; will monitor   4. Gerd: will continue protonix 40 mg daily  5. Constipation: colace twice daily; lactulose 30 cc twice daily   6. Depression: she is emotionally stable; will continue prozac 40 mg daily and takes melatonin 3 mg nightly for sleep.   7. Diabetes: she is presently not taking medications since aug 2015; her last hgb a1c was 6.5; her lisinopril was stopped during this hospitalization   8. Hypertension: is presently stable is not taking medications at this time; will not make changes will monitor her status.   Will check cbc and bmp   Time spent with patient 50 minutes.    Ok Edwards NP [facility]  Contact [phone] Monday through Friday 8am- 5pm  After hours call [phone]

## 2014-12-21 ENCOUNTER — Telehealth: Payer: Self-pay

## 2014-12-21 DIAGNOSIS — D649 Anemia, unspecified: Secondary | ICD-10-CM

## 2014-12-21 LAB — CULTURE, BLOOD (ROUTINE X 2): Culture: NO GROWTH

## 2014-12-21 NOTE — Telephone Encounter (Signed)
-----   Message from Milus Banister, MD sent at 12/17/2014  8:06 AM EST ----- She needs rov with me in 6 weeks, cbc a few days prior.  Probably going home next week from [facility].

## 2014-12-21 NOTE — Telephone Encounter (Signed)
Letter mailed with appt and notified to have labs

## 2014-12-22 ENCOUNTER — Other Ambulatory Visit: Payer: Self-pay | Admitting: Adult Health

## 2014-12-22 LAB — BASIC METABOLIC PANEL
BUN: 21 mg/dL (ref 6–23)
CALCIUM: 9 mg/dL (ref 8.4–10.5)
CO2: 19 mEq/L (ref 19–32)
Chloride: 106 mEq/L (ref 96–112)
Creat: 1.53 mg/dL — ABNORMAL HIGH (ref 0.50–1.10)
Glucose, Bld: 102 mg/dL — ABNORMAL HIGH (ref 70–99)
Potassium: 4.7 mEq/L (ref 3.5–5.3)
Sodium: 138 mEq/L (ref 135–145)

## 2014-12-22 LAB — TSH: TSH: 4.567 u[IU]/mL — ABNORMAL HIGH (ref 0.350–4.500)

## 2014-12-23 LAB — CULTURE, BLOOD (ROUTINE X 2)
CULTURE: NO GROWTH
Culture: NO GROWTH

## 2014-12-24 ENCOUNTER — Encounter: Payer: Self-pay | Admitting: Internal Medicine

## 2014-12-24 ENCOUNTER — Non-Acute Institutional Stay (SKILLED_NURSING_FACILITY): Payer: Medicare Other | Admitting: Internal Medicine

## 2014-12-24 DIAGNOSIS — G8929 Other chronic pain: Secondary | ICD-10-CM

## 2014-12-24 DIAGNOSIS — F329 Major depressive disorder, single episode, unspecified: Secondary | ICD-10-CM

## 2014-12-24 DIAGNOSIS — I10 Essential (primary) hypertension: Secondary | ICD-10-CM

## 2014-12-24 DIAGNOSIS — N183 Chronic kidney disease, stage 3 unspecified: Secondary | ICD-10-CM | POA: Insufficient documentation

## 2014-12-24 DIAGNOSIS — F32A Depression, unspecified: Secondary | ICD-10-CM

## 2014-12-24 DIAGNOSIS — E1143 Type 2 diabetes mellitus with diabetic autonomic (poly)neuropathy: Secondary | ICD-10-CM

## 2014-12-24 DIAGNOSIS — N189 Chronic kidney disease, unspecified: Secondary | ICD-10-CM

## 2014-12-24 NOTE — Progress Notes (Signed)
Patient ID: Lauren Barber, female   DOB: 11/29/1955, 59 y.o.   MRN: [id number]    HISTORY AND PHYSICAL  Location:    [facility]   Place of Service:   SNF  Extended Emergency Contact Information Primary Emergency Contact: Hill,[name] Address: [address] [place], [place] [address] [place] Phone: [phone] Relation: Mother  Advanced Directive information  FULL CODE  Chief Complaint  Patient presents with  . Readmit To SNF    acute on chronic renal insufficiency, muscle cramps, MDD, morbid obesity    HPI:  59 yo female seen as a readmission into SNF for above. Feels better. No further muscle cramps. Her appetite is reduced but no nausea. She denies any CP, new SOB, palpitations, HA or dizziness. No seizures. CBG 96 today. No low BS reactions. Her son and daughter-in-law are present.   Past Medical History  Diagnosis Date  . Hypertension   . Obesities, morbid   . Vaginal bleeding   . Endometriosis   . Atopic conjunctivitis   . Diabetes mellitus without complication   . Cellulitis     legs  . Insomnia   . Cardiomegaly   . Gout     feet  . Anemia   . Stroke 2013  . CVA (cerebral vascular accident)   . Anxiety   . Headache(784.0)     otc meds prn  . Degenerative, intervertebral disc     back, legs  . History of blood transfusion [facility] - unsure number of units transfused  . Neuromuscular disorder     diabetic neuropathy - feet  . Depressive disorder     depressive disorder    Past Surgical History  Procedure Laterality Date  . Cesarean section  1982, 1989    x 2  . Biopsy endometrial N/A 10 03 2013  . Dilation and curettage of uterus  2013  . Tonsillectomy    . Wisdom tooth extraction    . Hysteroscopy w/d&c N/A 06/28/2014    Procedure: DILATATION AND CURETTAGE /HYSTEROSCOPY;  Surgeon: Lavonia Drafts, MD;  Location: [facility];  Service: Gynecology;  Laterality: N/A;    Patient Care  Team: Shea Evans, DO as PCP - General (Internal Medicine) Gerlene Fee, NP as Nurse Practitioner (Nurse Practitioner)  History   Social History  . Marital Status: Single    Spouse Name: N/A  . Number of Children: N/A  . Years of Education: N/A   Occupational History  . Not on file.   Social History Main Topics  . Smoking status: Former Smoker -- 0.50 packs/day for 43 years    Types: Cigarettes    Quit date: 06/11/2012  . Smokeless tobacco: Never Used  . Alcohol Use: No  . Drug Use: No  . Sexual Activity: No   Other Topics Concern  . Not on file   Social History Narrative     reports that she quit smoking about 2 years ago. Her smoking use included Cigarettes. She has a 21.5 pack-year smoking history. She has never used smokeless tobacco. She reports that she does not drink alcohol or use illicit drugs.  Family History  Problem Relation Age of Onset  . Hypertension Mother   . Hypertension Father    No family status information on file.    Immunization History  Administered Date(s) Administered  . Influenza Whole 09/09/2013  . Influenza-Unspecified 09/01/2014  . Pneumococcal  Polysaccharide-23 12/16/2014  . Td 07/22/2006   Past medical, surgical, family and social history reviewed  No Known Allergies  Medications: Patient's Medications  New Prescriptions   No medications on file  Previous Medications   ALLOPURINOL (ZYLOPRIM) 100 MG TABLET    Take 200 mg by mouth daily.   AMITRIPTYLINE (ELAVIL) 100 MG TABLET    Take 100 mg by mouth at bedtime.   CEPHALEXIN (KEFLEX) 500 MG CAPSULE    Take 1 capsule (500 mg total) by mouth 2 (two) times daily. X 3 more days   DIPHENHYDRAMINE (BENADRYL) 25 MG TABLET    Take 25 mg by mouth every 8 (eight) hours as needed for itching. For itching   DOCUSATE SODIUM (COLACE) 100 MG CAPSULE    Take 1 capsule (100 mg total) by mouth 2 (two) times daily.   EPINEPHRINE (EPIPEN) 0.3 MG/0.3 ML DEVI    Inject 0.3 mg into  the muscle once.   FLUOXETINE (PROZAC) 20 MG CAPSULE    Take 2 capsules (40 mg total) by mouth daily.   LACTULOSE (CHRONULAC) 10 GM/15ML SOLUTION    Take 10 g by mouth 2 (two) times daily. constipation   MELATONIN 3 MG TABS    Take 3 mg by mouth at bedtime.   METHOCARBAMOL (ROBAXIN) 500 MG TABLET    Take 500 mg by mouth every 8 (eight) hours as needed for muscle spasms.   MULTIPLE VITAMINS-MINERALS (MULTIVITAMIN WITH MINERALS) TABLET    Take 1 tablet by mouth daily.   PANTOPRAZOLE (PROTONIX) 40 MG TABLET    Take 1 tablet (40 mg total) by mouth daily.   SODIUM CHLORIDE (OCEAN) 0.65 % SOLN NASAL SPRAY    Place 1 spray into both nostrils as needed for congestion.   TIZANIDINE (ZANAFLEX) 4 MG TABLET    Take 4 mg by mouth every 8 (eight) hours as needed for muscle spasms.   TRAMADOL (ULTRAM) 50 MG TABLET    Take 4 tablets (200 mg total) by mouth every 12 (twelve) hours. For pain  Modified Medications   No medications on file  Discontinued Medications   No medications on file    Review of Systems  As above. All other systems reviewed are negative  Filed Vitals:   12/24/14 0918  BP: 121/88  Pulse: 87  Temp: 97.3 F (36.3 C)  Weight: 332 lb (150.594 kg)  SpO2: 98%   Body mass index is 49.01 kg/(m^2).  Physical Exam CONSTITUTIONAL: Looks well in NAD. Awake, alert and oriented x 3 HEENT: PERRLA. Oropharynx clear and without exudate. MMM NECK: Supple. Nontender. No palpable cervical or supraclavicular lymph nodes. No carotid bruit b/l.  CVS: Regular rate without murmur, gallop or rub. LUNGS: CTA b/l no wheezing, rales or rhonchi. ABDOMEN: Bowel sounds present x 4. Soft, nontender, nondistended. No palpable mass or bruit EXTREMITIES: Trace LE edema b/l. Distal pulses palpable. No calf tenderness PSYCH: anxious appearing   Labs reviewed: Orders Only on 12/22/2014  Component Date Value Ref Range Status  . Sodium 12/22/2014 138  135 - 145 mEq/L Final  . Potassium 12/22/2014 4.7  3.5 -  5.3 mEq/L Final  . Chloride 12/22/2014 106  96 - 112 mEq/L Final  . CO2 12/22/2014 19  19 - 32 mEq/L Final  . Glucose, Bld 12/22/2014 102* 70 - 99 mg/dL Final  . BUN 12/22/2014 21  6 - 23 mg/dL Final  . Creat 12/22/2014 1.53* 0.50 - 1.10 mg/dL Final  . Calcium 12/22/2014 9.0  8.4 - 10.5 mg/dL  Final  . TSH 12/22/2014 4.567* 0.350 - 4.500 uIU/mL Final  Admission on 12/14/2014, Discharged on 12/19/2014  No results displayed because visit has over 200 results.    Orders Only on 12/14/2014  Component Date Value Ref Range Status  . WBC 12/14/2014 10.4  4.0 - 10.5 K/uL Final  . RBC 12/14/2014 2.45* 3.87 - 5.11 MIL/uL Final  . Hemoglobin 12/14/2014 7.6* 12.0 - 15.0 g/dL Final  . HCT 12/14/2014 22.8* 36.0 - 46.0 % Final  . MCV 12/14/2014 93.1  78.0 - 100.0 fL Final  . MCH 12/14/2014 31.0  26.0 - 34.0 pg Final  . MCHC 12/14/2014 33.3  30.0 - 36.0 g/dL Final  . RDW 12/14/2014 16.3* 11.5 - 15.5 % Final  . Platelets 12/14/2014 356  150 - 400 K/uL Final  . MPV 12/14/2014 8.5* 8.6 - 12.4 fL Final   ** Please note change in reference range(s). **  . Vitamin B-12 12/14/2014 1064* 211 - 911 pg/mL Final  . Folate 12/14/2014 15.5   Final   Comment:   Reference Ranges         Deficient:       0.4 - 3.3 ng/mL         Indeterminate:   3.4 - 5.4 ng/mL         Normal:              > 5.4 ng/mL     . Sodium 12/14/2014 133* 135 - 145 mEq/L Final  . Potassium 12/14/2014 4.4  3.5 - 5.3 mEq/L Final  . Chloride 12/14/2014 104  96 - 112 mEq/L Final  . CO2 12/14/2014 17* 19 - 32 mEq/L Final  . Glucose, Bld 12/14/2014 79  70 - 99 mg/dL Final  . BUN 12/14/2014 89* 6 - 23 mg/dL Final  . Creat 12/14/2014 4.15* 0.50 - 1.10 mg/dL Final  . Total Bilirubin 12/14/2014 0.3  0.2 - 1.2 mg/dL Final  . Alkaline Phosphatase 12/14/2014 111  39 - 117 U/L Final  . AST 12/14/2014 14  0 - 37 U/L Final  . ALT 12/14/2014 13  0 - 35 U/L Final  . Total Protein 12/14/2014 7.3  6.0 - 8.3 g/dL Final  . Albumin 12/14/2014 3.5  3.5 -  5.2 g/dL Final  . Calcium 12/14/2014 9.2  8.4 - 10.5 mg/dL Final  . TSH 12/14/2014 4.786* 0.350 - 4.500 uIU/mL Final   CMP Latest Ref Rng 12/22/2014 12/19/2014 12/18/2014  Glucose 70 - 99 mg/dL 102(H) 109(H) 127(H)  BUN 6 - 23 mg/dL 21 26(H) 32(H)  Creatinine 0.50 - 1.10 mg/dL 1.53(H) 1.71(H) 1.70(H)  Sodium 135 - 145 mEq/L 138 139 139  Potassium 3.5 - 5.3 mEq/L 4.7 4.3 3.8  Chloride 96 - 112 mEq/L 106 108 106  CO2 19 - 32 mEq/L 19 27 26   Calcium 8.4 - 10.5 mg/dL 9.0 9.5 9.7  Total Protein 6.0 - 8.3 g/dL - - -  Total Bilirubin 0.3 - 1.2 mg/dL - - -  Alkaline Phos 39 - 117 U/L - - -  AST 0 - 37 U/L - - -  ALT 0 - 35 U/L - - -     Ct Abdomen Pelvis Wo Contrast  12/15/2014   CLINICAL DATA:  Evaluate for bleed. Anemia and low back pain. Long-term anticoagulant use.  EXAM: CT ABDOMEN AND PELVIS WITHOUT CONTRAST  TECHNIQUE: Multidetector CT imaging of the abdomen and pelvis was performed following the standard protocol without IV contrast.  COMPARISON:  10/24/2013  FINDINGS: Slight  fibrosis in the lung bases. Prominent density demonstrated in the subcarinal region may represent mass or lymphadenopathy or could represent enlarged pulmonary artery. Small amount of pericardial fluid or thickening. Calcified granulomas adjacent to the EG junction region.  The unenhanced appearance of the liver, spleen, gallbladder, pancreas, adrenal glands, kidneys, abdominal aorta, inferior vena cava, and retroperitoneal lymph nodes is unremarkable. Stomach and small bowel are decompressed. Prominent stool-filled rectosigmoid colon with gaseous distention of the right hand transverse colon. This likely suggests constipation with pseudo-obstruction. No free air or free fluid in the abdomen. No abdominal or retroperitoneal hematomas demonstrated. Nonspecific vague infiltration in the visualize subcutaneous fat.  Pelvis: Bladder is decompressed. Uterus and ovaries are not enlarged. No free or loculated pelvic fluid  collections. No pelvic mass or lymphadenopathy. No evidence of pelvic hematoma. Degenerative changes in the spine. Slight anterior subluxation of L4 on L5 is probably degenerative. No destructive bone lesions.  IMPRESSION: No evidence of abdominal, pelvic, or retroperitoneal hematoma. Prominent stool-filled rectosigmoid colon with gaseous distention of the remainder of the colon suggesting obstipation with pseudo-obstruction. Mass versus prominent vascularity in the subcarinal region.   Electronically Signed   By: Lucienne Capers M.D.   On: 12/15/2014 01:07   Dg Chest 2 View  12/14/2014   CLINICAL DATA:  Seizures  EXAM: CHEST  2 VIEW  COMPARISON:  None.  FINDINGS: There is no appreciable edema or consolidation. There is elevation of the left hemidiaphragm. Heart is mildly enlarged with pulmonary vascularity within normal limits. No pneumothorax. No adenopathy.  IMPRESSION: Elevation left hemidiaphragm. No edema or consolidation. Mild cardiac enlargement.   Electronically Signed   By: Lowella Grip M.D.   On: 12/14/2014 19:03   Ct Head Wo Contrast  12/14/2014   CLINICAL DATA:  Seizure  EXAM: CT HEAD WITHOUT CONTRAST  TECHNIQUE: Contiguous axial images were obtained from the base of the skull through the vertex without intravenous contrast.  COMPARISON:  None.  FINDINGS: No evidence of parenchymal hemorrhage or extra-axial fluid collection. No mass lesion, mass effect, or midline shift.  No CT evidence of acute infarction.  Subcortical white matter and periventricular small vessel ischemic changes. Intracranial atherosclerosis.  Cerebral volume is within normal limits.  No ventriculomegaly.  Mild mucosal thickening in the left maxillary sinus. Mastoid air cells are clear.  No evidence of calvarial fracture.  IMPRESSION: No evidence of acute intracranial abnormality.  Small vessel ischemic changes with intracranial atherosclerosis.   Electronically Signed   By: Julian Hy M.D.   On: 12/14/2014 18:41     Ct Chest Wo Contrast  12/15/2014   CLINICAL DATA:  Concern for subcarinal mass on CT of the abdomen.  EXAM: CT CHEST WITHOUT CONTRAST  TECHNIQUE: Multidetector CT imaging of the chest was performed following the standard protocol without IV contrast.  COMPARISON:  CT abdomen 12/15/2014  FINDINGS: Mediastinum/Nodes: No axillary supraclavicular lymph adenopathy. No mediastinal lymphadenopathy. The mediastinal structures adjacent to the heart are not well evaluated without IV contrast. There is no clear mass in the subcarinal location. Findings likely represent the left atrium. No pericardial fluid. Esophagus is normal. There is bibasilar linear atelectasis.  Lungs/Pleura: There is linear atelectasis in the left lower lobe. No pleural nodularity. No pleural fluid or pulmonary edema.  Upper abdomen: Limited view of the liver, kidneys, pancreas are unremarkable. Normal adrenal glands.  Musculoskeletal: No acute osseous abnormality.  IMPRESSION: 1. While the mediastinal structures are difficult to evaluate without IV contrast, there does not appear to be a  subcarinal mass and the findings on comparison CT likely represent the left atrium. 2. Left basilar atelectasis.   Electronically Signed   By: Suzy Bouchard M.D.   On: 12/15/2014 18:29   US Renal  12/15/2014   CLINICAL DATA:  Acute renal insufficiency, initial evaluation  EXAM: RENAL/URINARY TRACT ULTRASOUND COMPLETE  COMPARISON:  None.  FINDINGS: Right Kidney:  Length: 9.0 cm. Echogenicity within normal limits. No mass or hydronephrosis visualized.  Left Kidney:  Length: 9.9 cm. Echogenicity within normal limits. No mass or hydronephrosis visualized.  Bladder:  Not visualized, likely decompressed by a known Foley catheter  IMPRESSION: Study is moderately limited by patient body habitus but appears grossly normal.   Electronically Signed   By: Skipper Cliche M.D.   On: 12/15/2014 12:54   Hospital records reviewed  Assessment/Plan   ICD-9-CM ICD-10-CM    1. Chronic kidney disease (CKD), unspecified stage- improved creatinine 585.9 N18.9   2. Diabetic autonomic neuropathy associated with type 2 diabetes mellitus - stable 250.60 E11.43    337.1    3. Essential hypertension-controlled  401.9 I10   4. Depression- stable 311 F32.9   5. Chronic pain- controlled 338.29 G89.29   6. OBESITY, MORBID 278.01 E66.01    --pt is medically stable on current tx plan. Continue current medications as ordered. Continue PT/OT as tolerated. Follow up with GI Dr Ardis Hughs in 2 weeks. Will follow  Silvestre Mines S. Perlie Gold  [facility] [address] [place], [place] [address] [phone] Office (Wednesdays and Fridays 8 AM - 5 PM) [phone] Cell (Monday-Friday 8 AM - 5 PM)

## 2015-01-28 ENCOUNTER — Non-Acute Institutional Stay (SKILLED_NURSING_FACILITY): Payer: Medicare Other | Admitting: Adult Health

## 2015-01-28 DIAGNOSIS — G8929 Other chronic pain: Secondary | ICD-10-CM

## 2015-01-28 DIAGNOSIS — D649 Anemia, unspecified: Secondary | ICD-10-CM | POA: Diagnosis not present

## 2015-01-28 DIAGNOSIS — K5901 Slow transit constipation: Secondary | ICD-10-CM

## 2015-01-28 DIAGNOSIS — E1165 Type 2 diabetes mellitus with hyperglycemia: Secondary | ICD-10-CM | POA: Diagnosis not present

## 2015-01-28 DIAGNOSIS — M109 Gout, unspecified: Secondary | ICD-10-CM

## 2015-01-28 DIAGNOSIS — I10 Essential (primary) hypertension: Secondary | ICD-10-CM

## 2015-01-28 DIAGNOSIS — IMO0001 Reserved for inherently not codable concepts without codable children: Secondary | ICD-10-CM

## 2015-02-21 ENCOUNTER — Ambulatory Visit: Payer: Medicare Other | Admitting: Gastroenterology

## 2015-02-24 ENCOUNTER — Encounter: Payer: Self-pay | Admitting: Podiatry

## 2015-02-24 ENCOUNTER — Ambulatory Visit (INDEPENDENT_AMBULATORY_CARE_PROVIDER_SITE_OTHER): Payer: Medicare Other | Admitting: Podiatry

## 2015-02-24 VITALS — BP 127/72 | HR 89 | Resp 17

## 2015-02-24 DIAGNOSIS — L600 Ingrowing nail: Secondary | ICD-10-CM | POA: Diagnosis not present

## 2015-02-24 NOTE — Patient Instructions (Signed)

## 2015-02-24 NOTE — Progress Notes (Signed)
   Subjective:    Patient ID: Lauren Barber, female    DOB: February 28, 1956, 59 y.o.   MRN: [id number]  HPI Comments: Pt states she has an ingrown left 2nd toenail, but does not know how long it has been bleeding.     Review of Systems  All other systems reviewed and are negative.      Objective:   Physical Exam        Assessment & Plan:

## 2015-02-25 NOTE — Progress Notes (Signed)
Subjective:     Patient ID: Lauren Barber, female   DOB: October 02, 1956, 59 y.o.   MRN: [id number]  HPI patient presents with a painful second nail bed left that irritated and very hard for her to cut. States it's been sore and that she's tried to trim it and soak it without relief of symptoms   Review of Systems     Objective:   Physical Exam Neurovascular status intact muscle strength adequate with good digital perfusion. Patient's found to have a thickened incurvated second nail left that's painful when pressed and makes shoe gear difficult    Assessment:     Ingrown deformity of the second toenail left with pain    Plan:     Reviewed condition and recommended removal of nail bed and a permanent fashion. Explained risk of procedure and the fact will not be nail bed and she is aware of this and wants procedure. Today I infiltrated the left second toe 60 mg Xylocaine Marcaine mixture remove the nail bed exposed matrix and applied phenol for applications 30 seconds followed by alcohol lavage and sterile dressing. Gave instructions on soaks and reappoint

## 2015-03-11 ENCOUNTER — Non-Acute Institutional Stay (SKILLED_NURSING_FACILITY): Payer: Medicare Other | Admitting: Adult Health

## 2015-03-11 DIAGNOSIS — I82409 Acute embolism and thrombosis of unspecified deep veins of unspecified lower extremity: Secondary | ICD-10-CM

## 2015-03-11 DIAGNOSIS — F329 Major depressive disorder, single episode, unspecified: Secondary | ICD-10-CM

## 2015-03-11 DIAGNOSIS — I10 Essential (primary) hypertension: Secondary | ICD-10-CM

## 2015-03-11 DIAGNOSIS — E1165 Type 2 diabetes mellitus with hyperglycemia: Secondary | ICD-10-CM

## 2015-03-11 DIAGNOSIS — IMO0001 Reserved for inherently not codable concepts without codable children: Secondary | ICD-10-CM

## 2015-03-11 DIAGNOSIS — F32A Depression, unspecified: Secondary | ICD-10-CM

## 2015-03-11 DIAGNOSIS — K5901 Slow transit constipation: Secondary | ICD-10-CM

## 2015-03-11 DIAGNOSIS — M109 Gout, unspecified: Secondary | ICD-10-CM | POA: Diagnosis not present

## 2015-03-11 DIAGNOSIS — G8929 Other chronic pain: Secondary | ICD-10-CM

## 2015-03-25 ENCOUNTER — Non-Acute Institutional Stay (SKILLED_NURSING_FACILITY): Payer: Medicare Other | Admitting: Adult Health

## 2015-03-25 DIAGNOSIS — R609 Edema, unspecified: Secondary | ICD-10-CM | POA: Diagnosis not present

## 2015-04-16 NOTE — Progress Notes (Signed)
Patient ID: Lauren Barber, female   DOB: September 14, 1956, 59 y.o.   MRN: [id number]  Lauren Barber living [facility]     No Known Allergies     Chief Complaint  Patient presents with  . Medical Management of Chronic Issues    HPI:  She is a long term resident of this facility being seen for the management of her chronic illnesses. Overall her status is stable. She is not voicing any concerns or complaints. Her tsh is slightly elevated and will require medication. There are no nursing concerns today.    Past Medical History  Diagnosis Date  . Hypertension   . Obesities, morbid   . Vaginal bleeding   . Endometriosis   . Atopic conjunctivitis   . Diabetes mellitus without complication   . Cellulitis     legs  . Insomnia   . Cardiomegaly   . Gout     feet  . Anemia   . Stroke 2013  . CVA (cerebral vascular accident)   . Anxiety   . Headache(784.0)     otc meds prn  . Degenerative, intervertebral disc     back, legs  . History of blood transfusion [facility] - unsure number of units transfused  . Neuromuscular disorder     diabetic neuropathy - feet  . Depressive disorder     depressive disorder    Past Surgical History  Procedure Laterality Date  . Cesarean section  1982, 1989    x 2  . Biopsy endometrial N/A 10 03 2013  . Dilation and curettage of uterus  2013  . Tonsillectomy    . Wisdom tooth extraction    . Hysteroscopy w/d&c N/A 06/28/2014    Procedure: DILATATION AND CURETTAGE /HYSTEROSCOPY;  Surgeon: Lavonia Drafts, MD;  Location: [facility];  Service: Gynecology;  Laterality: N/A;    VITAL SIGNS BP 115/69 mmHg  Pulse 69  Ht 5\' 9"  (1.753 m)  Wt 334 lb (151.501 kg)  BMI 49.30 kg/m2   Outpatient Encounter Prescriptions as of 01/28/2015  Medication Sig  . allopurinol (ZYLOPRIM) 100 MG tablet Take 200 mg by mouth daily.  Marland Kitchen amitriptyline (ELAVIL) 100 MG tablet Take 100 mg by mouth at bedtime.  . diphenhydrAMINE (BENADRYL) 25 MG tablet Take 25 mg  by mouth every 8 (eight) hours as needed for itching. For itching  . docusate sodium (COLACE) 100 MG capsule Take 1 capsule (100 mg total) by mouth 2 (two) times daily.  Marland Kitchen FLUoxetine (PROZAC) 20 MG capsule Take 2 capsules (40 mg total) by mouth daily.  Marland Kitchen lactulose (CHRONULAC) 10 GM/15ML solution Take 10 g by mouth 2 (two) times daily. constipation  . Melatonin 3 MG TABS Take 3 mg by mouth at bedtime.  . methocarbamol (ROBAXIN) 500 MG tablet Take 500 mg by mouth every 8 (eight) hours as needed for muscle spasms.  . Multiple Vitamins-Minerals (MULTIVITAMIN WITH MINERALS) tablet Take 1 tablet by mouth daily.  . pantoprazole (PROTONIX) 40 MG tablet Take 1 tablet (40 mg total) by mouth daily.  . sodium chloride (OCEAN) 0.65 % SOLN nasal spray Place 1 spray into both nostrils as needed for congestion.  Marland Kitchen tiZANidine (ZANAFLEX) 4 MG tablet Take 4 mg by mouth every 8 (eight) hours as needed for muscle spasms.  . traMADol (ULTRAM) 50 MG tablet Take 4 tablets (200 mg total) by mouth every 12 (twelve) hours. For pain      SIGNIFICANT DIAGNOSTIC EXAMS  12-14-14 ct of head: No evidence of acute  intracranial abnormality. Small vessel ischemic changes with intracranial atherosclerosis.  12-14-14: chest x-ray: Elevation left hemidiaphragm. No edema or consolidation. Mild cardiac enlargement.  12-15-14: ct of abdomen and pelvis: No evidence of abdominal, pelvic, or retroperitoneal hematoma. Prominent stool-filled rectosigmoid colon with gaseous distention of the remainder of the colon suggesting obstipation with pseudo-obstruction. Mass versus prominent vascularity in the subcarinal region.   12-15-14: renal ultrasound: Study is moderately limited by patient body habitus but appears grossly normal.   12-15-14: ct of chest: 1. While the mediastinal structures are difficult to evaluate without IV contrast, there does not appear to be a subcarinal mass and the findings on comparison CT likely represent the left  atrium. 2. Left basilar atelectasis.  12-15-14: EEG: This EEG is abnormal with mild localized nonspecific continuous slowing of cerebral activity. No evidence of an epileptic disorder was demonstrated. Lack of epileptiform activity during EEG recording does not rule out seizure disorder, however      LABS REVIEWED:   07-12-14: wbc 6.0; hgb 9.8; hct 29.9 ;mcv 93.4; lt 285;  07-30-14: glucose 146; bun 29; creat 1.3; k+4.5 ;na++ 141 09-10-14: hgb a1c 6.4 10-25-14: hgb a1c 6.5 10-29-14: chol 146; ldl 89; trig 127; hdl 32; urine micro-albumin <2.0 11-03-14: wbc 9.5; hgb 8.1; hct 25.3; mcv 96.;3 plt 268 12-14-14: wbc 1.4; hgb 7.6; hgb 22.8; mcv 93.1 plt 356; glucose 79; bun 89; creat 4.15; k+4.4 ;na++133; liver normal albumin 3.5; tsh 4.786; vit b12: 1064; folate 15.5 12-14-14: (ED)wbc 9.1; hgb 7.5; hct 22.2; mcv 91.5; plt 339; glucose 95; bun 97; creat 4.74; k+4.5 na++133; liver normal albumin 3.4; tsh 3.898; ammonia 38 12-15-14: wbc 9.8; hgb 7.5; hct 23.0; mcv 90.6; plt 299; glucose 81; bun 95; creat 4.81; k+4.2; na++133; liver normal albumin 3.0; uric acid 53; LDH 124; iron 38; tibc 154 PTH 93 12-16-14: urine culture no growth  12-17-14: wbc 7.0; hgb 8.9; hct 26.8; mcv 89.9; plt 309 12-19-14: glucose 109; bun 26; creat 1.71; k+4.3; na++139 12-23-14: glucose 102; bun 21; creat 1.53; k+4.7; na++138; tsh 4.567 01-21-15: wbc 8.0; hgb 9.6; hct 30.3; mcv 95.9; plt 296      ROS Constitutional: Negative for malaise/fatigue.  Respiratory: Negative for cough and shortness of breath.   Cardiovascular: Negative for chest pain, palpitations and leg swelling.  Gastrointestinal: Negative for heartburn and abdominal pain.  Musculoskeletal: Positive for myalgias. Negative for joint pain.       Is managed   Skin: Negative.   Psychiatric/Behavioral: Negative for depression. The patient is not nervous/anxious.      Physical Exam Constitutional: She is oriented to person, place, and time. No distress.  Morbidly  obese   Neck: Neck supple. No JVD present.  Cardiovascular: Normal rate, regular rhythm and intact distal pulses.   Respiratory: Effort normal and breath sounds normal. No respiratory distress. She has no wheezes.  GI: Soft. Bowel sounds are normal. She exhibits no distension. There is no tenderness.  Musculoskeletal: She exhibits no edema.  Is able to move all extremities; with limitations due to obesity; has left hand contracture.   Neurological: She is alert and oriented to person, place, and time.   Skin: Skin is warm. She is not diaphoretic       ASSESSMENT/ PLAN:   1. Gout: no recent flares will continue allopurinol 200 mg daily will monitor  2. DVT: she has completed her xarelto therapy will monitor   3. Chronic pain: her pain is being managed with her current regimen will continue ultram 200  mg twice daily; elavil 100 mg nightly; zanaflex 4 mg every 8 hours as needed robaxin 500 mg every 8 hours as needed; will monitor   4. Gerd: will continue protonix 40 mg daily  5. Constipation: colace twice daily; lactulose 30 cc twice daily   6. Depression: she is emotionally stable; will continue prozac 40 mg daily and takes melatonin 3 mg nightly for sleep.   7. Diabetes: she is presently not taking medications since aug 2015; her last hgb a1c was 6.5; her lisinopril was stopped during this hospitalization   8. Hypertension: is presently stable is not taking medications at this time; will not make changes will monitor her status.   9. Hypothyroidism: will begin synthroid 25 mcg daily and will check tsh in 6 weeks. tsh is 4.567      Ok Edwards NP [facility]  Contact [phone] Monday through Friday 8am- 5pm  After hours call [phone]

## 2015-04-29 ENCOUNTER — Non-Acute Institutional Stay (SKILLED_NURSING_FACILITY): Payer: Medicare Other | Admitting: Adult Health

## 2015-04-29 DIAGNOSIS — G8929 Other chronic pain: Secondary | ICD-10-CM

## 2015-04-29 DIAGNOSIS — M109 Gout, unspecified: Secondary | ICD-10-CM

## 2015-04-29 DIAGNOSIS — E1165 Type 2 diabetes mellitus with hyperglycemia: Secondary | ICD-10-CM | POA: Diagnosis not present

## 2015-04-29 DIAGNOSIS — E039 Hypothyroidism, unspecified: Secondary | ICD-10-CM | POA: Diagnosis not present

## 2015-04-29 DIAGNOSIS — F32A Depression, unspecified: Secondary | ICD-10-CM

## 2015-04-29 DIAGNOSIS — F329 Major depressive disorder, single episode, unspecified: Secondary | ICD-10-CM | POA: Diagnosis not present

## 2015-04-29 DIAGNOSIS — L03115 Cellulitis of right lower limb: Secondary | ICD-10-CM

## 2015-04-29 DIAGNOSIS — I10 Essential (primary) hypertension: Secondary | ICD-10-CM | POA: Diagnosis not present

## 2015-04-29 DIAGNOSIS — IMO0001 Reserved for inherently not codable concepts without codable children: Secondary | ICD-10-CM

## 2015-05-02 ENCOUNTER — Other Ambulatory Visit: Payer: Self-pay | Admitting: Internal Medicine

## 2015-05-02 DIAGNOSIS — Z1231 Encounter for screening mammogram for malignant neoplasm of breast: Secondary | ICD-10-CM

## 2015-05-09 ENCOUNTER — Encounter: Payer: Self-pay | Admitting: Adult Health

## 2015-05-09 NOTE — Progress Notes (Signed)
Patient ID: Lauren Barber, female   DOB: March 05, 1956, 59 y.o.   MRN: [id number]  [name] living [facility]     No Known Allergies     Chief Complaint  Patient presents with  . Medical Management of Chronic Issues    HPI:  She is a long term resident of this facility being seen for the management of her chronic illnesses.  Overall her status is stable. She is complaining of bilateral itchy eyes with yellow drainage and sclera are slightly red. There are no nursing concerns at this time.   Past Medical History  Diagnosis Date  . Hypertension   . Obesities, morbid   . Vaginal bleeding   . Endometriosis   . Atopic conjunctivitis   . Diabetes mellitus without complication   . Cellulitis     legs  . Insomnia   . Cardiomegaly   . Gout     feet  . Anemia   . Stroke 2013  . CVA (cerebral vascular accident)   . Anxiety   . Headache(784.0)     otc meds prn  . Degenerative, intervertebral disc     back, legs  . History of blood transfusion [facility] - unsure number of units transfused  . Neuromuscular disorder     diabetic neuropathy - feet  . Depressive disorder     depressive disorder    Past Surgical History  Procedure Laterality Date  . Cesarean section  1982, 1989    x 2  . Biopsy endometrial N/A 10 03 2013  . Dilation and curettage of uterus  2013  . Tonsillectomy    . Wisdom tooth extraction    . Hysteroscopy w/d&c N/A 06/28/2014    Procedure: DILATATION AND CURETTAGE /HYSTEROSCOPY;  Surgeon: Lavonia Drafts, MD;  Location: [facility];  Service: Gynecology;  Laterality: N/A;    VITAL SIGNS BP 120/70 mmHg  Pulse 78  Ht 5\' 9"  (1.753 m)  Wt 343 lb (155.584 kg)  BMI 50.63 kg/m2   Outpatient Encounter Prescriptions as of 03/11/2015  Medication Sig  . allopurinol (ZYLOPRIM) 100 MG tablet Take 200 mg by mouth daily.  Marland Kitchen amitriptyline (ELAVIL) 100 MG tablet Take 100 mg by mouth at bedtime.  . diphenhydrAMINE (BENADRYL) 25 MG tablet Take 25 mg by  mouth every 8 (eight) hours as needed for itching. For itching  . docusate sodium (COLACE) 100 MG capsule Take 1 capsule (100 mg total) by mouth 2 (two) times daily.  Marland Kitchen EPINEPHrine (EPIPEN) 0.3 mg/0.3 mL DEVI Inject 0.3 mg into the muscle once.  Marland Kitchen FLUoxetine (PROZAC) 20 MG capsule Take 2 capsules (40 mg total) by mouth daily.  Marland Kitchen lactulose (CHRONULAC) 10 GM/15ML solution Take 10 g by mouth 2 (two) times daily. constipation  . levothyroxine (SYNTHROID, LEVOTHROID) 25 MCG tablet Take 25 mcg by mouth daily before breakfast.  . Melatonin 3 MG TABS Take 3 mg by mouth at bedtime.  . methocarbamol (ROBAXIN) 500 MG tablet Take 500 mg by mouth every 8 (eight) hours as needed for muscle spasms.  . Multiple Vitamins-Minerals (MULTIVITAMIN WITH MINERALS) tablet Take 1 tablet by mouth daily.  . pantoprazole (PROTONIX) 40 MG tablet Take 1 tablet (40 mg total) by mouth daily.  . sodium chloride (OCEAN) 0.65 % SOLN nasal spray Place 1 spray into both nostrils as needed for congestion.  Marland Kitchen tiZANidine (ZANAFLEX) 4 MG tablet Take 4 mg by mouth every 8 (eight) hours as needed for muscle spasms.  . traMADol (ULTRAM) 50 MG tablet  Take 4 tablets (200 mg total) by mouth every 12 (twelve) hours. For pain      SIGNIFICANT DIAGNOSTIC EXAMS   12-14-14 ct of head: No evidence of acute intracranial abnormality. Small vessel ischemic changes with intracranial atherosclerosis.  12-14-14: chest x-ray: Elevation left hemidiaphragm. No edema or consolidation. Mild cardiac enlargement.  12-15-14: ct of abdomen and pelvis: No evidence of abdominal, pelvic, or retroperitoneal hematoma. Prominent stool-filled rectosigmoid colon with gaseous distention of the remainder of the colon suggesting obstipation with pseudo-obstruction. Mass versus prominent vascularity in the subcarinal region.   12-15-14: renal ultrasound: Study is moderately limited by patient body habitus but appears grossly normal.   12-15-14: ct of chest: 1. While  the mediastinal structures are difficult to evaluate without IV contrast, there does not appear to be a subcarinal mass and the findings on comparison CT likely represent the left atrium. 2. Left basilar atelectasis.  12-15-14: EEG: This EEG is abnormal with mild localized nonspecific continuous slowing of cerebral activity. No evidence of an epileptic disorder was demonstrated. Lack of epileptiform activity during EEG recording does not rule out seizure disorder, however      LABS REVIEWED:   07-12-14: wbc 6.0; hgb 9.8; hct 29.9 ;mcv 93.4; lt 285;  07-30-14: glucose 146; bun 29; creat 1.3; k+4.5 ;na++ 141 09-10-14: hgb a1c 6.4 10-25-14: hgb a1c 6.5 10-29-14: chol 146; ldl 89; trig 127; hdl 32; urine micro-albumin <2.0 11-03-14: wbc 9.5; hgb 8.1; hct 25.3; mcv 96.;3 plt 268 12-14-14: wbc 1.4; hgb 7.6; hgb 22.8; mcv 93.1 plt 356; glucose 79; bun 89; creat 4.15; k+4.4 ;na++133; liver normal albumin 3.5; tsh 4.786; vit b12: 1064; folate 15.5 12-14-14: (ED)wbc 9.1; hgb 7.5; hct 22.2; mcv 91.5; plt 339; glucose 95; bun 97; creat 4.74; k+4.5 na++133; liver normal albumin 3.4; tsh 3.898; ammonia 38 12-15-14: wbc 9.8; hgb 7.5; hct 23.0; mcv 90.6; plt 299; glucose 81; bun 95; creat 4.81; k+4.2; na++133; liver normal albumin 3.0; uric acid 53; LDH 124; iron 38; tibc 154 PTH 93 12-16-14: urine culture no growth  12-17-14: wbc 7.0; hgb 8.9; hct 26.8; mcv 89.9; plt 309 12-19-14: glucose 109; bun 26; creat 1.71; k+4.3; na++139 12-23-14: glucose 102; bun 21; creat 1.53; k+4.7; na++138; tsh 4.567 01-21-15: wbc 8.0; hgb 9.6; hct 30.3; mcv 95.9; plt 296     ROS Constitutional: Negative for malaise/fatigue.  Respiratory: Negative for cough and shortness of breath.   Cardiovascular: Negative for chest pain, palpitations and leg swelling.  Gastrointestinal: Negative for heartburn and abdominal pain.  Musculoskeletal: negative for myalgias  Negative for joint pain.  Skin: Negative.   Psychiatric/Behavioral: Negative  for depression. The patient is not nervous/anxious.     Physical Exam Constitutional: She is oriented to person, place, and time. No distress.  Morbidly obese Bilateral eyes with slightly red sclera present and no drainage at this time.    Neck: Neck supple. No JVD present.  Cardiovascular: Normal rate, regular rhythm and intact distal pulses.   Respiratory: Effort normal and breath sounds normal. No respiratory distress. She has no wheezes.  GI: Soft. Bowel sounds are normal. She exhibits no distension. There is no tenderness.  Musculoskeletal: She exhibits no edema.  Is able to move all extremities; with limitations due to obesity; has left hand contracture.   Neurological: She is alert and oriented to person, place, and time.   Skin: Skin is warm. She is not diaphoretic      ASSESSMENT/ PLAN:  1. Gout: no recent flares will continue allopurinol  200 mg daily will monitor  2. DVT: she has completed her xarelto therapy; no further swelling present  will monitor   3. Chronic pain: her pain is being managed with her current regimen will continue ultram 200 mg twice daily; elavil 100 mg nightly; zanaflex 4 mg every 8 hours as needed robaxin 500 mg every 8 hours as needed; will monitor   4. Gerd: will continue protonix 40 mg daily  5. Constipation: colace twice daily; lactulose 30 cc twice daily   6. Depression: she is emotionally stable; will continue prozac 40 mg daily and takes melatonin 3 mg nightly for sleep.   7. Diabetes: she is presently not taking medications since aug 2015; her last hgb a1c was 6.5;   8. Hypertension: is presently stable is not taking medications at this time; will not make changes will monitor her status.   9. Hypothyroidism: will continue n synthroid 25 mcg daily is due for her follow up lab work . tsh is 4.567   For her allergic eyes will being pataday to both eyes daily    Ok Edwards NP [facility]  Contact [phone] Monday  through Friday 8am- 5pm  After hours call [phone]

## 2015-05-10 ENCOUNTER — Ambulatory Visit (HOSPITAL_COMMUNITY)
Admission: RE | Admit: 2015-05-10 | Discharge: 2015-05-10 | Disposition: A | Discharge status: Home / Self Care | Payer: Medicare Other | Source: Ambulatory Visit | Attending: Internal Medicine | Admitting: Internal Medicine

## 2015-05-10 DIAGNOSIS — Z1231 Encounter for screening mammogram for malignant neoplasm of breast: Secondary | ICD-10-CM

## 2015-05-18 NOTE — Progress Notes (Signed)
Patient ID: Lauren Barber, female   DOB: 1956/09/05, 59 y.o.   MRN: [id number]  [name] living [place]     No Known Allergies     Chief Complaint  Patient presents with  . Acute Visit    edema    HPI: Staff reports that she is having increased edema present in her lower extremities with the right worse than the left. She states that the edema in her right leg feels uncomfortable. She denies any shortness of breath or chest pain.    Past Medical History  Diagnosis Date  . Hypertension   . Obesities, morbid   . Vaginal bleeding   . Endometriosis   . Atopic conjunctivitis   . Diabetes mellitus without complication   . Cellulitis     legs  . Insomnia   . Cardiomegaly   . Gout     feet  . Anemia   . Stroke 2013  . CVA (cerebral vascular accident)   . Anxiety   . Headache(784.0)     otc meds prn  . Degenerative, intervertebral disc     back, legs  . History of blood transfusion [facility] - unsure number of units transfused  . Neuromuscular disorder     diabetic neuropathy - feet  . Depressive disorder     depressive disorder    Past Surgical History  Procedure Laterality Date  . Cesarean section  1982, 1989    x 2  . Biopsy endometrial N/A 10 03 2013  . Dilation and curettage of uterus  2013  . Tonsillectomy    . Wisdom tooth extraction    . Hysteroscopy w/d&c N/A 06/28/2014    Procedure: DILATATION AND CURETTAGE /HYSTEROSCOPY;  Surgeon: Lavonia Drafts, MD;  Location: [facility];  Service: Gynecology;  Laterality: N/A;    VITAL SIGNS BP 133/69 mmHg  Pulse 70  Ht 5\' 9"  (1.753 m)  Wt 343 lb (155.584 kg)  BMI 50.63 kg/m2   Outpatient Encounter Prescriptions as of 03/25/2015  Medication Sig  . allopurinol (ZYLOPRIM) 100 MG tablet Take 200 mg by mouth daily.  Marland Kitchen amitriptyline (ELAVIL) 100 MG tablet Take 100 mg by mouth at bedtime.  . diphenhydrAMINE (BENADRYL) 25 MG tablet Take 25 mg by mouth every 8 (eight) hours as needed for itching. For  itching  . docusate sodium (COLACE) 100 MG capsule Take 1 capsule (100 mg total) by mouth 2 (two) times daily.  Marland Kitchen EPINEPHrine (EPIPEN) 0.3 mg/0.3 mL DEVI Inject 0.3 mg into the muscle once.  Marland Kitchen FLUoxetine (PROZAC) 20 MG capsule Take 2 capsules (40 mg total) by mouth daily.  Marland Kitchen lactulose (CHRONULAC) 10 GM/15ML solution Take 10 g by mouth 2 (two) times daily. constipation  . levothyroxine (SYNTHROID, LEVOTHROID) 25 MCG tablet Take 25 mcg by mouth daily before breakfast.  . Melatonin 3 MG TABS Take 3 mg by mouth at bedtime.  . methocarbamol (ROBAXIN) 500 MG tablet Take 500 mg by mouth every 8 (eight) hours as needed for muscle spasms.  . Multiple Vitamins-Minerals (MULTIVITAMIN WITH MINERALS) tablet Take 1 tablet by mouth daily.  . pantoprazole (PROTONIX) 40 MG tablet Take 1 tablet (40 mg total) by mouth daily.  . sodium chloride (OCEAN) 0.65 % SOLN nasal spray Place 1 spray into both nostrils as needed for congestion.  Marland Kitchen tiZANidine (ZANAFLEX) 4 MG tablet Take 4 mg by mouth every 8 (eight) hours as needed for muscle spasms.  . traMADol (ULTRAM) 50 MG tablet Take 4 tablets (200 mg total)  by mouth every 12 (twelve) hours. For pain      SIGNIFICANT DIAGNOSTIC EXAMS   12-14-14 ct of head: No evidence of acute intracranial abnormality. Small vessel ischemic changes with intracranial atherosclerosis.  12-14-14: chest x-ray: Elevation left hemidiaphragm. No edema or consolidation. Mild cardiac enlargement.  12-15-14: ct of abdomen and pelvis: No evidence of abdominal, pelvic, or retroperitoneal hematoma. Prominent stool-filled rectosigmoid colon with gaseous distention of the remainder of the colon suggesting obstipation with pseudo-obstruction. Mass versus prominent vascularity in the subcarinal region.   12-15-14: renal ultrasound: Study is moderately limited by patient body habitus but appears grossly normal.   12-15-14: ct of chest: 1. While the mediastinal structures are difficult to evaluate  without IV contrast, there does not appear to be a subcarinal mass and the findings on comparison CT likely represent the left atrium. 2. Left basilar atelectasis.  12-15-14: EEG: This EEG is abnormal with mild localized nonspecific continuous slowing of cerebral activity. No evidence of an epileptic disorder was demonstrated. Lack of epileptiform activity during EEG recording does not rule out seizure disorder, however      LABS REVIEWED:   07-12-14: wbc 6.0; hgb 9.8; hct 29.9 ;mcv 93.4; lt 285;  07-30-14: glucose 146; bun 29; creat 1.3; k+4.5 ;na++ 141 09-10-14: hgb a1c 6.4 10-25-14: hgb a1c 6.5 10-29-14: chol 146; ldl 89; trig 127; hdl 32; urine micro-albumin <2.0 11-03-14: wbc 9.5; hgb 8.1; hct 25.3; mcv 96.;3 plt 268 12-14-14: wbc 1.4; hgb 7.6; hgb 22.8; mcv 93.1 plt 356; glucose 79; bun 89; creat 4.15; k+4.4 ;na++133; liver normal albumin 3.5; tsh 4.786; vit b12: 1064; folate 15.5 12-14-14: (ED)wbc 9.1; hgb 7.5; hct 22.2; mcv 91.5; plt 339; glucose 95; bun 97; creat 4.74; k+4.5 na++133; liver normal albumin 3.4; tsh 3.898; ammonia 38 12-15-14: wbc 9.8; hgb 7.5; hct 23.0; mcv 90.6; plt 299; glucose 81; bun 95; creat 4.81; k+4.2; na++133; liver normal albumin 3.0; uric acid 53; LDH 124; iron 38; tibc 154 PTH 93 12-16-14: urine culture no growth  12-17-14: wbc 7.0; hgb 8.9; hct 26.8; mcv 89.9; plt 309 12-19-14: glucose 109; bun 26; creat 1.71; k+4.3; na++139 12-23-14: glucose 102; bun 21; creat 1.53; k+4.7; na++138; tsh 4.567 01-21-15: wbc 8.0; hgb 9.6; hct 30.3; mcv 95.9; plt 296       ROS Constitutional: Negative for malaise/fatigue.  Respiratory: Negative for cough and shortness of breath.   Cardiovascular: Negative for chest pain, palpitations has edema .  Gastrointestinal: Negative for heartburn and abdominal pain.  Musculoskeletal: negative for myalgias  Negative for joint pain.  Skin: Negative.   Psychiatric/Behavioral: Negative for depression. The patient is not nervous/anxious.       Physical Exam Constitutional: She is oriented to person, place, and time. No distress.  Morbidly obese    Neck: Neck supple. No JVD present.  Cardiovascular: Normal rate, regular rhythm and intact distal pulses.   Respiratory: Effort normal and breath sounds normal. No respiratory distress. She has no wheezes.  GI: Soft. Bowel sounds are normal. She exhibits no distension. There is no tenderness.  Musculoskeletal: She exhibits edema present right worse than left  Is able to move all extremities; with limitations due to obesity; has left hand contracture.   Neurological: She is alert and oriented to person, place, and time.   Skin: Skin is warm. She is not diaphoretic    ASSESSMENT/ PLAN:  1. Edema: will begin lasix 20 mg daily; will check bmp in one week.   Ok Edwards NP [facility]  Contact [phone] Monday through Friday 8am- 5pm  After hours call [phone]

## 2015-05-30 ENCOUNTER — Non-Acute Institutional Stay (SKILLED_NURSING_FACILITY): Payer: Medicare Other | Admitting: Adult Health

## 2015-05-30 ENCOUNTER — Other Ambulatory Visit: Payer: Self-pay | Admitting: Internal Medicine

## 2015-05-30 DIAGNOSIS — IMO0001 Reserved for inherently not codable concepts without codable children: Secondary | ICD-10-CM

## 2015-05-30 DIAGNOSIS — M109 Gout, unspecified: Secondary | ICD-10-CM

## 2015-05-30 DIAGNOSIS — E1165 Type 2 diabetes mellitus with hyperglycemia: Secondary | ICD-10-CM

## 2015-05-30 DIAGNOSIS — L03115 Cellulitis of right lower limb: Secondary | ICD-10-CM | POA: Diagnosis not present

## 2015-05-30 DIAGNOSIS — Z792 Long term (current) use of antibiotics: Secondary | ICD-10-CM

## 2015-05-30 DIAGNOSIS — E1143 Type 2 diabetes mellitus with diabetic autonomic (poly)neuropathy: Secondary | ICD-10-CM | POA: Diagnosis not present

## 2015-05-30 DIAGNOSIS — E039 Hypothyroidism, unspecified: Secondary | ICD-10-CM

## 2015-05-30 DIAGNOSIS — I10 Essential (primary) hypertension: Secondary | ICD-10-CM | POA: Diagnosis not present

## 2015-05-31 ENCOUNTER — Ambulatory Visit (HOSPITAL_COMMUNITY)
Admission: RE | Admit: 2015-05-31 | Discharge: 2015-05-31 | Disposition: A | Discharge status: Home / Self Care | Payer: Medicare Other | Source: Ambulatory Visit | Attending: Internal Medicine | Admitting: Internal Medicine

## 2015-05-31 ENCOUNTER — Other Ambulatory Visit: Payer: Self-pay | Admitting: Internal Medicine

## 2015-05-31 DIAGNOSIS — L03314 Cellulitis of groin: Secondary | ICD-10-CM

## 2015-05-31 DIAGNOSIS — E1140 Type 2 diabetes mellitus with diabetic neuropathy, unspecified: Secondary | ICD-10-CM | POA: Diagnosis present

## 2015-05-31 DIAGNOSIS — F419 Anxiety disorder, unspecified: Secondary | ICD-10-CM | POA: Diagnosis present

## 2015-05-31 DIAGNOSIS — F329 Major depressive disorder, single episode, unspecified: Secondary | ICD-10-CM | POA: Diagnosis present

## 2015-05-31 DIAGNOSIS — M109 Gout, unspecified: Secondary | ICD-10-CM | POA: Diagnosis present

## 2015-05-31 DIAGNOSIS — D649 Anemia, unspecified: Secondary | ICD-10-CM | POA: Diagnosis present

## 2015-05-31 DIAGNOSIS — E871 Hypo-osmolality and hyponatremia: Secondary | ICD-10-CM | POA: Diagnosis present

## 2015-05-31 DIAGNOSIS — G4700 Insomnia, unspecified: Secondary | ICD-10-CM | POA: Diagnosis present

## 2015-05-31 DIAGNOSIS — E1122 Type 2 diabetes mellitus with diabetic chronic kidney disease: Secondary | ICD-10-CM | POA: Diagnosis present

## 2015-05-31 DIAGNOSIS — A419 Sepsis, unspecified organism: Principal | ICD-10-CM | POA: Diagnosis present

## 2015-05-31 DIAGNOSIS — E1165 Type 2 diabetes mellitus with hyperglycemia: Secondary | ICD-10-CM | POA: Diagnosis present

## 2015-05-31 DIAGNOSIS — Z7401 Bed confinement status: Secondary | ICD-10-CM

## 2015-05-31 DIAGNOSIS — R509 Fever, unspecified: Secondary | ICD-10-CM | POA: Diagnosis not present

## 2015-05-31 DIAGNOSIS — Z86718 Personal history of other venous thrombosis and embolism: Secondary | ICD-10-CM

## 2015-05-31 DIAGNOSIS — I69354 Hemiplegia and hemiparesis following cerebral infarction affecting left non-dominant side: Secondary | ICD-10-CM

## 2015-05-31 DIAGNOSIS — Z6841 Body Mass Index (BMI) 40.0 and over, adult: Secondary | ICD-10-CM

## 2015-05-31 DIAGNOSIS — L03115 Cellulitis of right lower limb: Secondary | ICD-10-CM | POA: Diagnosis present

## 2015-05-31 DIAGNOSIS — Z792 Long term (current) use of antibiotics: Secondary | ICD-10-CM

## 2015-05-31 DIAGNOSIS — I129 Hypertensive chronic kidney disease with stage 1 through stage 4 chronic kidney disease, or unspecified chronic kidney disease: Secondary | ICD-10-CM | POA: Diagnosis present

## 2015-05-31 DIAGNOSIS — N809 Endometriosis, unspecified: Secondary | ICD-10-CM | POA: Diagnosis present

## 2015-05-31 DIAGNOSIS — K5900 Constipation, unspecified: Secondary | ICD-10-CM | POA: Diagnosis present

## 2015-05-31 DIAGNOSIS — I517 Cardiomegaly: Secondary | ICD-10-CM | POA: Diagnosis present

## 2015-05-31 DIAGNOSIS — Z87891 Personal history of nicotine dependence: Secondary | ICD-10-CM

## 2015-05-31 DIAGNOSIS — N183 Chronic kidney disease, stage 3 (moderate): Secondary | ICD-10-CM | POA: Diagnosis present

## 2015-05-31 MED ORDER — LIDOCAINE HCL 1 % IJ SOLN
INTRAMUSCULAR | Status: AC
  Filled 2015-05-31: qty 20

## 2015-05-31 MED ORDER — HEPARIN SOD (PORK) LOCK FLUSH 100 UNIT/ML IV SOLN
INTRAVENOUS | Status: AC
  Filled 2015-05-31: qty 5

## 2015-05-31 NOTE — Procedures (Signed)
Interventional Radiology Procedure Note  Procedure: Placement SL PowerPICC (40cm) vis right brachial vein. Tip in distal SVC.   Complications: None  Estimated Blood Loss: 0  Recommendations: - Routine line care  Signed,  Criselda Peaches, MD

## 2015-06-01 ENCOUNTER — Inpatient Hospital Stay (HOSPITAL_COMMUNITY)
Admission: EM | Admit: 2015-06-01 | Discharge: 2015-06-03 | DRG: 872 | Disposition: A | Discharge status: Skilled Nursing Facility | Payer: Medicare Other | Attending: Internal Medicine | Admitting: Internal Medicine

## 2015-06-01 ENCOUNTER — Encounter (HOSPITAL_COMMUNITY): Payer: Self-pay | Admitting: Emergency Medicine

## 2015-06-01 ENCOUNTER — Emergency Department (HOSPITAL_COMMUNITY): Payer: Medicare Other

## 2015-06-01 DIAGNOSIS — D649 Anemia, unspecified: Secondary | ICD-10-CM | POA: Diagnosis present

## 2015-06-01 DIAGNOSIS — A419 Sepsis, unspecified organism: Secondary | ICD-10-CM | POA: Diagnosis present

## 2015-06-01 DIAGNOSIS — E1165 Type 2 diabetes mellitus with hyperglycemia: Secondary | ICD-10-CM

## 2015-06-01 DIAGNOSIS — L0390 Cellulitis, unspecified: Secondary | ICD-10-CM | POA: Diagnosis present

## 2015-06-01 DIAGNOSIS — N183 Chronic kidney disease, stage 3 unspecified: Secondary | ICD-10-CM | POA: Diagnosis present

## 2015-06-01 DIAGNOSIS — E871 Hypo-osmolality and hyponatremia: Secondary | ICD-10-CM | POA: Diagnosis present

## 2015-06-01 DIAGNOSIS — IMO0001 Reserved for inherently not codable concepts without codable children: Secondary | ICD-10-CM | POA: Diagnosis present

## 2015-06-01 DIAGNOSIS — M7989 Other specified soft tissue disorders: Secondary | ICD-10-CM

## 2015-06-01 DIAGNOSIS — I10 Essential (primary) hypertension: Secondary | ICD-10-CM | POA: Diagnosis present

## 2015-06-01 DIAGNOSIS — L03119 Cellulitis of unspecified part of limb: Secondary | ICD-10-CM

## 2015-06-01 LAB — CBC WITH DIFFERENTIAL/PLATELET
Basophils Absolute: 0 10*3/uL (ref 0.0–0.1)
Basophils Relative: 0 % (ref 0–1)
Eosinophils Absolute: 0.2 10*3/uL (ref 0.0–0.7)
Eosinophils Relative: 2 % (ref 0–5)
HCT: 30 % — ABNORMAL LOW (ref 36.0–46.0)
Hemoglobin: 9.7 g/dL — ABNORMAL LOW (ref 12.0–15.0)
LYMPHS ABS: 0.9 10*3/uL (ref 0.7–4.0)
Lymphocytes Relative: 14 % (ref 12–46)
MCH: 30.5 pg (ref 26.0–34.0)
MCHC: 32.3 g/dL (ref 30.0–36.0)
MCV: 94.3 fL (ref 78.0–100.0)
MONO ABS: 0.2 10*3/uL (ref 0.1–1.0)
Monocytes Relative: 3 % (ref 3–12)
Neutro Abs: 5.3 10*3/uL (ref 1.7–7.7)
Neutrophils Relative %: 81 % — ABNORMAL HIGH (ref 43–77)
PLATELETS: 203 10*3/uL (ref 150–400)
RBC: 3.18 MIL/uL — ABNORMAL LOW (ref 3.87–5.11)
RDW: 14.2 % (ref 11.5–15.5)
WBC: 6.5 10*3/uL (ref 4.0–10.5)

## 2015-06-01 LAB — COMPREHENSIVE METABOLIC PANEL
ALT: 19 U/L (ref 14–54)
AST: 22 U/L (ref 15–41)
Albumin: 3 g/dL — ABNORMAL LOW (ref 3.5–5.0)
Alkaline Phosphatase: 97 U/L (ref 38–126)
Anion gap: 7 (ref 5–15)
BUN: 15 mg/dL (ref 6–20)
CHLORIDE: 99 mmol/L — AB (ref 101–111)
CO2: 25 mmol/L (ref 22–32)
CREATININE: 1.62 mg/dL — AB (ref 0.44–1.00)
Calcium: 8.8 mg/dL — ABNORMAL LOW (ref 8.9–10.3)
GFR calc Af Amer: 40 mL/min — ABNORMAL LOW (ref >60)
GFR calc non Af Amer: 34 mL/min — ABNORMAL LOW (ref >60)
GLUCOSE: 102 mg/dL — AB (ref 65–99)
POTASSIUM: 4.6 mmol/L (ref 3.5–5.1)
Sodium: 131 mmol/L — ABNORMAL LOW (ref 135–145)
Total Bilirubin: 0.5 mg/dL (ref 0.3–1.2)
Total Protein: 7.8 g/dL (ref 6.5–8.1)

## 2015-06-01 LAB — CBG MONITORING, ED: Glucose-Capillary: 97 mg/dL (ref 65–99)

## 2015-06-01 LAB — I-STAT CG4 LACTIC ACID, ED: LACTIC ACID, VENOUS: 0.92 mmol/L (ref 0.5–2.0)

## 2015-06-01 MED ORDER — VANCOMYCIN HCL IN DEXTROSE 1-5 GM/200ML-% IV SOLN: 1000.0000 mg | Freq: Once | INTRAVENOUS | Status: DC

## 2015-06-01 MED ORDER — VANCOMYCIN HCL 10 G IV SOLR
1250.0000 mg | Freq: Once | INTRAVENOUS | Status: AC
Start: 2015-06-01 — End: 2015-06-02
  Administered 2015-06-01: 1250 mg via INTRAVENOUS
  Filled 2015-06-01: qty 1250

## 2015-06-01 MED ORDER — SODIUM CHLORIDE 0.9 % IV BOLUS (SEPSIS)
1000.0000 mL | INTRAVENOUS | Status: AC
  Administered 2015-06-01: 1000 mL via INTRAVENOUS

## 2015-06-01 MED ORDER — VANCOMYCIN HCL 10 G IV SOLR
2250.0000 mg | Freq: Once | INTRAVENOUS | Status: DC
  Filled 2015-06-01: qty 2250

## 2015-06-01 MED ORDER — PIPERACILLIN-TAZOBACTAM 3.375 G IVPB
3.3750 g | Freq: Three times a day (TID) | INTRAVENOUS | Status: DC
  Administered 2015-06-02 – 2015-06-03 (×4): 3.375 g via INTRAVENOUS
  Filled 2015-06-01 (×7): qty 50

## 2015-06-01 MED ORDER — PIPERACILLIN-TAZOBACTAM 3.375 G IVPB 30 MIN
3.3750 g | Freq: Once | INTRAVENOUS | Status: AC
  Administered 2015-06-01: 3.375 g via INTRAVENOUS
  Filled 2015-06-01: qty 50

## 2015-06-01 MED ORDER — ACETAMINOPHEN 325 MG PO TABS
650.0000 mg | ORAL_TABLET | Freq: Once | ORAL | Status: AC | PRN
Start: 2015-06-01 — End: 2015-06-01
  Administered 2015-06-01: 650 mg via ORAL
  Filled 2015-06-01: qty 2

## 2015-06-01 NOTE — ED Provider Notes (Signed)
CSN: [id number]     Arrival date & time 06/01/15  2057 History   First MD Initiated Contact with Patient 06/01/15 2118     Chief Complaint  Patient presents with  . Fever  . Hyperventilating  . Tachycardia     (Consider location/radiation/quality/duration/timing/severity/associated sxs/prior Treatment) HPI Comments: Patient is a 59 year old female past medical history significant for hypertension, DM, cardiomegaly, history of CVA with residual left-sided weakness, headaches, neuropathy presenting to the emergency department from [facility] for hypotension, tachypnea, fever and worsening right ankle swelling and redness. Patient states today she woke up and felt very fatigued, noticed that she had more swelling and redness in her right foot. She recently had a PICC line in place to receive IV antibiotics for bilateral leg cellulitis. No aggravating or relieving factors notified. No medications for hyperpyrexia prior to arrival. Denies any CP, SOB, nausea, vomiting, diarrhea, rash.   Patient is a 59 y.o. female presenting with fever.  Fever Associated symptoms: no chest pain, no cough, no diarrhea and no vomiting     Past Medical History  Diagnosis Date  . Hypertension   . Obesities, morbid   . Vaginal bleeding   . Endometriosis   . Atopic conjunctivitis   . Diabetes mellitus without complication   . Cellulitis     legs  . Insomnia   . Cardiomegaly   . Gout     feet  . Anemia   . Stroke 2013  . CVA (cerebral vascular accident)   . Anxiety   . Headache(784.0)     otc meds prn  . Degenerative, intervertebral disc     back, legs  . History of blood transfusion [facility] - unsure number of units transfused  . Neuromuscular disorder     diabetic neuropathy - feet  . Depressive disorder     depressive disorder   Past Surgical History  Procedure Laterality Date  . Cesarean section  1982, 1989    x 2  . Biopsy endometrial N/A 10 03 2013  . Dilation and  curettage of uterus  2013  . Tonsillectomy    . Wisdom tooth extraction    . Hysteroscopy w/d&c N/A 06/28/2014    Procedure: DILATATION AND CURETTAGE /HYSTEROSCOPY;  Surgeon: Lavonia Drafts, MD;  Location: [facility];  Service: Gynecology;  Laterality: N/A;   Family History  Problem Relation Age of Onset  . Hypertension Mother   . Hypertension Father    History  Substance Use Topics  . Smoking status: Former Smoker -- 0.50 packs/day for 43 years    Types: Cigarettes    Quit date: 06/11/2012  . Smokeless tobacco: Never Used  . Alcohol Use: No   OB History    Gravida Para Term Preterm AB TAB SAB Ectopic Multiple Living   2 2 2  0 0 0 0 0 0 2     Review of Systems  Constitutional: Positive for fever.  Respiratory: Negative for cough and shortness of breath.   Cardiovascular: Negative for chest pain.  Gastrointestinal: Negative for vomiting, abdominal pain and diarrhea.  Musculoskeletal:       + right foot swelling  Skin: Positive for color change.  Neurological: Negative for syncope, weakness and numbness.  All other systems reviewed and are negative.     Allergies  Review of patient's allergies indicates no known allergies.  Home Medications   Prior to Admission medications   Medication Sig Start Date End Date Taking? Authorizing Provider  acetaminophen (TYLENOL)  325 MG tablet Take 650 mg by mouth every 6 (six) hours as needed for moderate pain, fever or headache.    Yes Historical Provider, MD  allopurinol (ZYLOPRIM) 100 MG tablet Take 200 mg by mouth daily. 05/19/13  Yes Tiffany L Reed, DO  amitriptyline (ELAVIL) 100 MG tablet Take 100 mg by mouth at bedtime.   Yes Historical Provider, MD  diphenhydrAMINE (BENADRYL) 25 MG tablet Take 25 mg by mouth every 8 (eight) hours as needed for itching. For itching   Yes Historical Provider, MD  docusate sodium (COLACE) 100 MG capsule Take 1 capsule (100 mg total) by mouth 2 (two) times daily. Patient taking differently: Take  100 mg by mouth daily.  12/19/14  Yes Ripudeep Krystal Eaton, MD  FLUoxetine (PROZAC) 20 MG capsule Take 2 capsules (40 mg total) by mouth daily. 12/13/14  Yes Gerlene Fee, NP  furosemide (LASIX) 20 MG tablet Take 20 mg by mouth daily.   Yes Historical Provider, MD  gabapentin (NEURONTIN) 300 MG capsule Take 300 mg by mouth at bedtime.   Yes Historical Provider, MD  lactulose (CHRONULAC) 10 GM/15ML solution Take 10 g by mouth daily. constipation   Yes Historical Provider, MD  levothyroxine (SYNTHROID, LEVOTHROID) 25 MCG tablet Take 25 mcg by mouth daily before breakfast.   Yes Historical Provider, MD  Melatonin 3 MG TABS Take 3 mg by mouth at bedtime.   Yes Historical Provider, MD  methocarbamol (ROBAXIN) 500 MG tablet Take 500 mg by mouth every 8 (eight) hours as needed for muscle spasms.   Yes Historical Provider, MD  Multiple Vitamins-Minerals (MULTIVITAMIN WITH MINERALS) tablet Take 1 tablet by mouth daily.   Yes Historical Provider, MD  pantoprazole (PROTONIX) 40 MG tablet Take 1 tablet (40 mg total) by mouth daily. 12/19/14  Yes Ripudeep K Rai, MD  PATADAY 0.2 % SOLN Place 1 drop into both eyes as needed. For allergies 03/11/15  Yes Historical Provider, MD  piperacillin-tazobactam (ZOSYN) 3.375 (3-0.375) G injection Inject 3.375 g into the muscle every 8 (eight) hours.   Yes Historical Provider, MD  polyethylene glycol (MIRALAX / GLYCOLAX) packet Take 17 g by mouth daily.   Yes Historical Provider, MD  saccharomyces boulardii (FLORASTOR) 250 MG capsule Take 250 mg by mouth 2 (two) times daily.   Yes Historical Provider, MD  sodium chloride (OCEAN) 0.65 % SOLN nasal spray Place 1 spray into both nostrils as needed for congestion.   Yes Historical Provider, MD  tiZANidine (ZANAFLEX) 4 MG tablet Take 4 mg by mouth every 8 (eight) hours as needed for muscle spasms.   Yes Historical Provider, MD  traMADol (ULTRAM) 50 MG tablet Take 4 tablets (200 mg total) by mouth every 12 (twelve) hours. For pain 07/20/14   Yes Mahima Bubba Camp, MD  vancomycin (VANCOCIN) 1 GM/200ML SOLN Inject 2,000 mg into the vein every evening. Started 05/31/15, for 21 days ending 06/24/15   Yes Historical Provider, MD  EPINEPHrine (EPIPEN) 0.3 mg/0.3 mL DEVI Inject 0.3 mg into the muscle once.    Historical Provider, MD  levofloxacin (LEVAQUIN) 500 MG tablet Take 500 mg by mouth every evening.    Historical Provider, MD   BP 121/68 mmHg  Pulse 97  Temp(Src) 98.6 F (37 C) (Oral)  Resp 18  Ht 5\' 7"  (1.702 m)  Wt 369 lb (167.377 kg)  BMI 57.78 kg/m2  SpO2 100% Physical Exam  Constitutional: She is oriented to person, place, and time. She appears well-developed and well-nourished. No distress.  Exam  limited by body habitus.   HENT:  Head: Normocephalic and atraumatic.  Right Ear: External ear normal.  Left Ear: External ear normal.  Nose: Nose normal.  Mouth/Throat: Oropharynx is clear and moist.  Eyes: Conjunctivae are normal.  Neck: Normal range of motion. Neck supple.  No nuchal rigidity.   Cardiovascular: Regular rhythm and normal heart sounds.  Tachycardia present.   Pulmonary/Chest: Effort normal and breath sounds normal. No respiratory distress.  Abdominal: Soft. There is no tenderness.  Neurological: She is alert and oriented to person, place, and time.  Skin: Skin is warm and dry. She is not diaphoretic. There is erythema (right and left foot).  R foot swelling noted.   Psychiatric: She has a normal mood and affect.  Nursing note and vitals reviewed.   ED Course  Procedures (including critical care time) Medications  sodium chloride 0.9 % bolus 1,000 mL (1,000 mLs Intravenous New Bag/Given 06/01/15 2241)    And  sodium chloride 0.9 % bolus 1,000 mL (1,000 mLs Intravenous New Bag/Given 06/01/15 2353)  piperacillin-tazobactam (ZOSYN) IVPB 3.375 g (3.375 g Intravenous Given 06/02/15 1433)  vancomycin (VANCOCIN) 1,750 mg in sodium chloride 0.9 % 500 mL IVPB (not administered)  insulin aspart (novoLOG) injection  0-5 Units (0 Units Subcutaneous Not Given 06/02/15 0156)  insulin aspart (novoLOG) injection 0-15 Units (0 Units Subcutaneous Not Given 06/02/15 1835)  gabapentin (NEURONTIN) capsule 300 mg (300 mg Oral Given 06/02/15 0254)  polyethylene glycol (MIRALAX / GLYCOLAX) packet 17 g (17 g Oral Given 06/02/15 0943)  saccharomyces boulardii (FLORASTOR) capsule 250 mg (250 mg Oral Given 06/02/15 0943)  sodium chloride (OCEAN) 0.65 % nasal spray 1 spray (not administered)  levothyroxine (SYNTHROID, LEVOTHROID) tablet 25 mcg (25 mcg Oral Given 06/02/15 0813)  docusate sodium (COLACE) capsule 100 mg (100 mg Oral Given 06/02/15 0943)  methocarbamol (ROBAXIN) tablet 500 mg (not administered)  pantoprazole (PROTONIX) EC tablet 40 mg (40 mg Oral Given 06/02/15 0943)  FLUoxetine (PROZAC) capsule 40 mg (40 mg Oral Given 06/02/15 0943)  tiZANidine (ZANAFLEX) tablet 4 mg (not administered)  amitriptyline (ELAVIL) tablet 100 mg (100 mg Oral Given 06/02/15 0254)  traMADol (ULTRAM) tablet 200 mg (200 mg Oral Given 06/02/15 0944)  allopurinol (ZYLOPRIM) tablet 200 mg (200 mg Oral Given 06/02/15 0943)  lactulose (CHRONULAC) 10 GM/15ML solution 10 g (10 g Oral Not Given 06/02/15 0943)  sodium chloride 0.9 % injection 3 mL (3 mLs Intravenous Not Given 06/02/15 0957)  acetaminophen (TYLENOL) tablet 650 mg (not administered)    Or  acetaminophen (TYLENOL) suppository 650 mg (not administered)  HYDROcodone-acetaminophen (NORCO/VICODIN) 5-325 MG per tablet 1-2 tablet (not administered)  ondansetron (ZOFRAN) tablet 4 mg (not administered)    Or  ondansetron (ZOFRAN) injection 4 mg (not administered)  enoxaparin (LOVENOX) injection 80 mg (80 mg Subcutaneous Given 06/02/15 0943)  0.9 %  sodium chloride infusion ( Intravenous Stopped 06/02/15 1833)  mupirocin ointment (BACTROBAN) 2 % 1 application (1 application Nasal Given 06/02/15 0944)  Chlorhexidine Gluconate Cloth 2 % PADS 6 each (6 each Topical Given 06/02/15 0523)  magnesium oxide  (MAG-OX) tablet 400 mg (400 mg Oral Given 06/02/15 1433)  acetaminophen (TYLENOL) tablet 650 mg (650 mg Oral Given 06/01/15 2309)  piperacillin-tazobactam (ZOSYN) IVPB 3.375 g (0 g Intravenous Stopped 06/01/15 2311)  vancomycin (VANCOCIN) 1,250 mg in sodium chloride 0.9 % 250 mL IVPB (1,250 mg Intravenous New Bag/Given 06/01/15 2311)  vancomycin (VANCOCIN) IVPB 1000 mg/200 mL premix (1,000 mg Intravenous Given 06/02/15 0523)    Labs  Review Labs Reviewed  MRSA PCR SCREENING - Abnormal; Notable for the following:    MRSA by PCR POSITIVE (*)    All other components within normal limits  COMPREHENSIVE METABOLIC PANEL - Abnormal; Notable for the following:    Sodium 131 (*)    Chloride 99 (*)    Glucose, Bld 102 (*)    Creatinine, Ser 1.62 (*)    Calcium 8.8 (*)    Albumin 3.0 (*)    GFR calc non Af Amer 34 (*)    GFR calc Af Amer 40 (*)    All other components within normal limits  CBC WITH DIFFERENTIAL/PLATELET - Abnormal; Notable for the following:    RBC 3.18 (*)    Hemoglobin 9.7 (*)    HCT 30.0 (*)    Neutrophils Relative % 81 (*)    All other components within normal limits  MAGNESIUM - Abnormal; Notable for the following:    Magnesium 1.6 (*)    All other components within normal limits  COMPREHENSIVE METABOLIC PANEL - Abnormal; Notable for the following:    Sodium 132 (*)    Glucose, Bld 130 (*)    Creatinine, Ser 1.62 (*)    Calcium 8.5 (*)    Albumin 2.8 (*)    GFR calc non Af Amer 34 (*)    GFR calc Af Amer 40 (*)    All other components within normal limits  CBC - Abnormal; Notable for the following:    RBC 3.21 (*)    Hemoglobin 9.8 (*)    HCT 30.4 (*)    All other components within normal limits  GLUCOSE, CAPILLARY - Abnormal; Notable for the following:    Glucose-Capillary 133 (*)    All other components within normal limits  GLUCOSE, CAPILLARY - Abnormal; Notable for the following:    Glucose-Capillary 127 (*)    All other components within normal limits   GLUCOSE, CAPILLARY - Abnormal; Notable for the following:    Glucose-Capillary 101 (*)    All other components within normal limits  CULTURE, BLOOD (ROUTINE X 2)  CULTURE, BLOOD (ROUTINE X 2)  URINE CULTURE  PHOSPHORUS  TSH  OSMOLALITY, URINE  URINALYSIS, ROUTINE W REFLEX MICROSCOPIC (NOT AT [facility])  HEMOGLOBIN A1C  SODIUM, URINE, RANDOM  CREATININE, URINE, RANDOM  CBC  BASIC METABOLIC PANEL  I-STAT CG4 LACTIC ACID, ED  CBG MONITORING, ED  I-STAT CG4 LACTIC ACID, ED    Imaging Review Dg Chest 2 View  06/01/2015   CLINICAL DATA:  Possible sepsis and cellulitis of the right leg.  EXAM: CHEST  2 VIEW  COMPARISON:  CT chest 12/15/2014  FINDINGS: Right-sided PICC line with the tip projecting over the SVC. There is no focal parenchymal opacity. There is no pleural effusion or pneumothorax. The heart and mediastinal contours are unremarkable.  The osseous structures are unremarkable.  IMPRESSION: No active cardiopulmonary disease.  Right-sided PICC line with the tip projecting over the SVC.   Electronically Signed   By: Kathreen Devoid   On: 06/01/2015 22:22     EKG Interpretation None      CRITICAL CARE Performed by: Baron Sane L   Total critical care time: 40 minutes  Critical care time was exclusive of separately billable procedures and treating other patients.  Critical care was necessary to treat or prevent imminent or life-threatening deterioration.  Critical care was time spent personally by me on the following activities: development of treatment plan with patient and/or surrogate as well as  nursing, discussions with consultants, evaluation of patient's response to treatment, examination of patient, obtaining history from patient or surrogate, ordering and performing treatments and interventions, ordering and review of laboratory studies, ordering and review of radiographic studies, pulse oximetry and re-evaluation of patient's condition.  MDM   Final diagnoses:   Cellulitis of lower extremity, unspecified laterality    Filed Vitals:   06/02/15 1546  BP: 121/68  Pulse: 97  Temp: 98.6 F (37 C)  Resp: 18   I have reviewed nursing notes, vital signs, and all appropriate lab and imaging results if ordered as above.    Presenting to the ED for fever, tachycardia, and episode of hypotension at nursing home. Fever and tachycardia improved with antipyretic treatment and IVF. Zoysn will be added to at home Vancomycin. Bilateral ankle erythema and warmth with worsening swelling of R ankle. ROM intact Neurovascularly intact. Normal sensation. No evidence of compartment syndrome. Patient with failed outpatient cellulitis treatment will admit for further treatment and management. Patient d/w with Dr. Roderic Palau, agrees with plan.      Baron Sane, PA-C 06/02/15 2049  Milton Ferguson, MD 06/06/15 1355

## 2015-06-01 NOTE — ED Notes (Signed)
Pt presents from [facility] by [facility] for hypotension, tachypnea, fever; pt was given 325mg  tylenol via [facility] at 19:25; pt reports bilat leg/foot pain and hx of cellulitis and CVA with resulting L sided weakness; pt CAOx4 at this time;

## 2015-06-01 NOTE — ED Notes (Signed)
Tori, RN called to access PICC and initiate IV fluids and ABX therapy

## 2015-06-01 NOTE — ED Notes (Signed)
Pt transported to xray 

## 2015-06-01 NOTE — Progress Notes (Addendum)
ANTIBIOTIC CONSULT NOTE - INITIAL  Pharmacy Consult for zosyn/vancomycin Indication: cellulitis  No Known Allergies  Patient Measurements: Height: 5\' 7"  (170.2 cm) Weight: (!) 336 lb (152.409 kg) IBW/kg (Calculated) : 61.6   Vital Signs: Temp: 101.7 F (38.7 C) (07/13 2111) Temp Source: Oral (07/13 2111) BP: 136/81 mmHg (07/13 2115) Pulse Rate: 112 (07/13 2115) Intake/Output from previous day:   Intake/Output from this shift:    Labs: No results for input(s): WBC, HGB, PLT, LABCREA, CREATININE in the last 72 hours. CrCl cannot be calculated (Patient has no serum creatinine result on file.). No results for input(s): VANCOTROUGH, VANCOPEAK, VANCORANDOM, GENTTROUGH, GENTPEAK, GENTRANDOM, TOBRATROUGH, TOBRAPEAK, TOBRARND, AMIKACINPEAK, AMIKACINTROU, AMIKACIN in the last 72 hours.   Microbiology: No results found for this or any previous visit (from the past 720 hour(s)).  Medical History: Past Medical History  Diagnosis Date  . Hypertension   . Obesities, morbid   . Vaginal bleeding   . Endometriosis   . Atopic conjunctivitis   . Diabetes mellitus without complication   . Cellulitis     legs  . Insomnia   . Cardiomegaly   . Gout     feet  . Anemia   . Stroke 2013  . CVA (cerebral vascular accident)   . Anxiety   . Headache(784.0)     otc meds prn  . Degenerative, intervertebral disc     back, legs  . History of blood transfusion [facility] - unsure number of units transfused  . Neuromuscular disorder     diabetic neuropathy - feet  . Depressive disorder     depressive disorder     Assessment: 59 yo female from [facility] living with fever to begin zosyn/vancomycin for cellulitis. WBC= 6.5, tmax= 101.7, and last SCr was 1.52 in 12/2014. Zosyn 3.375gm and vancomycin 1000mg  x1 have been ordered in the ED.   7/13 zosyn 7/13 vancomycin  7/13 blood x2 7/13 urine  Goal of Therapy:  Vancomycin trough level 10-15 mcg/ml  Plan:  -Zosyn 3.375gm IV  q8h -Vancomycin 2250mg  IV loading dose  -Will follow renal function for maintenance dosing  Hildred Laser, Pharm D 06/01/2015 9:59 PM   Addendum:  SCr 1.62, est normalized CrCl 45 ml/min  Plan: Continue Zosyn 3.375gm IV q8h - each dose over 4 hours Vancomycin 1750mg  IV q24h Will f/u renal function, micro data, and pt's clinical condition  Sherlon Handing, PharmD, BCPS Clinical pharmacist, pager [phone] 06/02/2015 12:18 AM

## 2015-06-02 ENCOUNTER — Inpatient Hospital Stay (HOSPITAL_COMMUNITY): Payer: Medicare Other

## 2015-06-02 ENCOUNTER — Encounter (HOSPITAL_COMMUNITY): Payer: Self-pay

## 2015-06-02 DIAGNOSIS — I69354 Hemiplegia and hemiparesis following cerebral infarction affecting left non-dominant side: Secondary | ICD-10-CM | POA: Diagnosis not present

## 2015-06-02 DIAGNOSIS — K5900 Constipation, unspecified: Secondary | ICD-10-CM | POA: Diagnosis present

## 2015-06-02 DIAGNOSIS — N809 Endometriosis, unspecified: Secondary | ICD-10-CM | POA: Diagnosis present

## 2015-06-02 DIAGNOSIS — E1165 Type 2 diabetes mellitus with hyperglycemia: Secondary | ICD-10-CM | POA: Diagnosis not present

## 2015-06-02 DIAGNOSIS — Z87891 Personal history of nicotine dependence: Secondary | ICD-10-CM | POA: Diagnosis not present

## 2015-06-02 DIAGNOSIS — I129 Hypertensive chronic kidney disease with stage 1 through stage 4 chronic kidney disease, or unspecified chronic kidney disease: Secondary | ICD-10-CM | POA: Diagnosis present

## 2015-06-02 DIAGNOSIS — Z7401 Bed confinement status: Secondary | ICD-10-CM | POA: Diagnosis not present

## 2015-06-02 DIAGNOSIS — M7989 Other specified soft tissue disorders: Secondary | ICD-10-CM

## 2015-06-02 DIAGNOSIS — L03115 Cellulitis of right lower limb: Secondary | ICD-10-CM | POA: Diagnosis not present

## 2015-06-02 DIAGNOSIS — A419 Sepsis, unspecified organism: Secondary | ICD-10-CM | POA: Diagnosis present

## 2015-06-02 DIAGNOSIS — N189 Chronic kidney disease, unspecified: Secondary | ICD-10-CM

## 2015-06-02 DIAGNOSIS — Z6841 Body Mass Index (BMI) 40.0 and over, adult: Secondary | ICD-10-CM | POA: Diagnosis not present

## 2015-06-02 DIAGNOSIS — I517 Cardiomegaly: Secondary | ICD-10-CM | POA: Diagnosis present

## 2015-06-02 DIAGNOSIS — F329 Major depressive disorder, single episode, unspecified: Secondary | ICD-10-CM | POA: Diagnosis present

## 2015-06-02 DIAGNOSIS — E871 Hypo-osmolality and hyponatremia: Secondary | ICD-10-CM | POA: Diagnosis present

## 2015-06-02 DIAGNOSIS — F419 Anxiety disorder, unspecified: Secondary | ICD-10-CM | POA: Diagnosis present

## 2015-06-02 DIAGNOSIS — N183 Chronic kidney disease, stage 3 (moderate): Secondary | ICD-10-CM | POA: Diagnosis present

## 2015-06-02 DIAGNOSIS — I10 Essential (primary) hypertension: Secondary | ICD-10-CM

## 2015-06-02 DIAGNOSIS — G4700 Insomnia, unspecified: Secondary | ICD-10-CM | POA: Diagnosis present

## 2015-06-02 DIAGNOSIS — Z86718 Personal history of other venous thrombosis and embolism: Secondary | ICD-10-CM | POA: Diagnosis not present

## 2015-06-02 DIAGNOSIS — L03116 Cellulitis of left lower limb: Secondary | ICD-10-CM

## 2015-06-02 DIAGNOSIS — D649 Anemia, unspecified: Secondary | ICD-10-CM | POA: Diagnosis present

## 2015-06-02 DIAGNOSIS — M109 Gout, unspecified: Secondary | ICD-10-CM | POA: Diagnosis present

## 2015-06-02 DIAGNOSIS — E1122 Type 2 diabetes mellitus with diabetic chronic kidney disease: Secondary | ICD-10-CM | POA: Diagnosis present

## 2015-06-02 DIAGNOSIS — R509 Fever, unspecified: Secondary | ICD-10-CM | POA: Diagnosis present

## 2015-06-02 DIAGNOSIS — E1140 Type 2 diabetes mellitus with diabetic neuropathy, unspecified: Secondary | ICD-10-CM | POA: Diagnosis present

## 2015-06-02 LAB — COMPREHENSIVE METABOLIC PANEL
ALBUMIN: 2.8 g/dL — AB (ref 3.5–5.0)
ALK PHOS: 99 U/L (ref 38–126)
ALT: 19 U/L (ref 14–54)
ANION GAP: 8 (ref 5–15)
AST: 23 U/L (ref 15–41)
BUN: 15 mg/dL (ref 6–20)
CO2: 23 mmol/L (ref 22–32)
Calcium: 8.5 mg/dL — ABNORMAL LOW (ref 8.9–10.3)
Chloride: 101 mmol/L (ref 101–111)
Creatinine, Ser: 1.62 mg/dL — ABNORMAL HIGH (ref 0.44–1.00)
GFR calc Af Amer: 40 mL/min — ABNORMAL LOW (ref >60)
GFR calc non Af Amer: 34 mL/min — ABNORMAL LOW (ref >60)
Glucose, Bld: 130 mg/dL — ABNORMAL HIGH (ref 65–99)
POTASSIUM: 4.1 mmol/L (ref 3.5–5.1)
SODIUM: 132 mmol/L — AB (ref 135–145)
Total Bilirubin: 0.5 mg/dL (ref 0.3–1.2)
Total Protein: 7.1 g/dL (ref 6.5–8.1)

## 2015-06-02 LAB — CBC
HCT: 30.4 % — ABNORMAL LOW (ref 36.0–46.0)
HEMOGLOBIN: 9.8 g/dL — AB (ref 12.0–15.0)
MCH: 30.5 pg (ref 26.0–34.0)
MCHC: 32.2 g/dL (ref 30.0–36.0)
MCV: 94.7 fL (ref 78.0–100.0)
Platelets: 189 10*3/uL (ref 150–400)
RBC: 3.21 MIL/uL — ABNORMAL LOW (ref 3.87–5.11)
RDW: 14.3 % (ref 11.5–15.5)
WBC: 5.3 10*3/uL (ref 4.0–10.5)

## 2015-06-02 LAB — GLUCOSE, CAPILLARY
Glucose-Capillary: 133 mg/dL — ABNORMAL HIGH (ref 65–99)
Glucose-Capillary: 127 mg/dL — ABNORMAL HIGH (ref 65–99)
Glucose-Capillary: 101 mg/dL — ABNORMAL HIGH (ref 65–99)

## 2015-06-02 LAB — OSMOLALITY, URINE: Osmolality, Ur: 473 mOsm/kg (ref 390–1090)

## 2015-06-02 LAB — I-STAT CG4 LACTIC ACID, ED: Lactic Acid, Venous: 0.87 mmol/L (ref 0.5–2.0)

## 2015-06-02 LAB — MAGNESIUM: Magnesium: 1.6 mg/dL — ABNORMAL LOW (ref 1.7–2.4)

## 2015-06-02 LAB — MRSA PCR SCREENING: MRSA BY PCR: POSITIVE — AB

## 2015-06-02 LAB — TSH: TSH: 2.979 u[IU]/mL (ref 0.350–4.500)

## 2015-06-02 LAB — PHOSPHORUS: PHOSPHORUS: 3.2 mg/dL (ref 2.5–4.6)

## 2015-06-02 MED ORDER — TRAMADOL HCL 50 MG PO TABS
200.0000 mg | ORAL_TABLET | Freq: Two times a day (BID) | ORAL | Status: DC
  Administered 2015-06-02 – 2015-06-03 (×4): 200 mg via ORAL
  Filled 2015-06-02 (×5): qty 4

## 2015-06-02 MED ORDER — DOCUSATE SODIUM 100 MG PO CAPS
100.0000 mg | ORAL_CAPSULE | Freq: Every day | ORAL | Status: DC
  Administered 2015-06-02 – 2015-06-03 (×2): 100 mg via ORAL
  Filled 2015-06-02 (×2): qty 1

## 2015-06-02 MED ORDER — SACCHAROMYCES BOULARDII 250 MG PO CAPS
250.0000 mg | ORAL_CAPSULE | Freq: Two times a day (BID) | ORAL | Status: DC
  Administered 2015-06-02 – 2015-06-03 (×4): 250 mg via ORAL
  Filled 2015-06-02 (×5): qty 1

## 2015-06-02 MED ORDER — FLUOXETINE HCL 20 MG PO CAPS
40.0000 mg | ORAL_CAPSULE | Freq: Every day | ORAL | Status: DC
  Administered 2015-06-02 – 2015-06-03 (×2): 40 mg via ORAL
  Filled 2015-06-02 (×2): qty 2

## 2015-06-02 MED ORDER — PANTOPRAZOLE SODIUM 40 MG PO TBEC
40.0000 mg | DELAYED_RELEASE_TABLET | Freq: Every day | ORAL | Status: DC
  Administered 2015-06-02 – 2015-06-03 (×2): 40 mg via ORAL
  Filled 2015-06-02 (×2): qty 1

## 2015-06-02 MED ORDER — ONDANSETRON HCL 4 MG PO TABS: 4.0000 mg | ORAL_TABLET | Freq: Four times a day (QID) | ORAL | Status: DC | PRN

## 2015-06-02 MED ORDER — FUROSEMIDE 20 MG PO TABS: 20.0000 mg | ORAL_TABLET | Freq: Every day | ORAL | Status: DC

## 2015-06-02 MED ORDER — ACETAMINOPHEN 325 MG PO TABS: 650.0000 mg | ORAL_TABLET | Freq: Four times a day (QID) | ORAL | Status: DC | PRN

## 2015-06-02 MED ORDER — MAGNESIUM OXIDE 400 (241.3 MG) MG PO TABS
400.0000 mg | ORAL_TABLET | Freq: Every day | ORAL | Status: DC
  Administered 2015-06-02 – 2015-06-03 (×2): 400 mg via ORAL
  Filled 2015-06-02 (×2): qty 1

## 2015-06-02 MED ORDER — AMITRIPTYLINE HCL 50 MG PO TABS
100.0000 mg | ORAL_TABLET | Freq: Every day | ORAL | Status: DC
  Administered 2015-06-02 (×2): 100 mg via ORAL
  Filled 2015-06-02 (×2): qty 2

## 2015-06-02 MED ORDER — GABAPENTIN 300 MG PO CAPS
300.0000 mg | ORAL_CAPSULE | Freq: Every day | ORAL | Status: DC
  Administered 2015-06-02 (×2): 300 mg via ORAL
  Filled 2015-06-02 (×2): qty 1

## 2015-06-02 MED ORDER — ALLOPURINOL 100 MG PO TABS
200.0000 mg | ORAL_TABLET | Freq: Every day | ORAL | Status: DC
  Administered 2015-06-02 – 2015-06-03 (×2): 200 mg via ORAL
  Filled 2015-06-02 (×2): qty 2

## 2015-06-02 MED ORDER — HYDROCODONE-ACETAMINOPHEN 5-325 MG PO TABS: 1.0000 | ORAL_TABLET | ORAL | Status: DC | PRN

## 2015-06-02 MED ORDER — METHOCARBAMOL 500 MG PO TABS: 500.0000 mg | ORAL_TABLET | Freq: Three times a day (TID) | ORAL | Status: DC | PRN

## 2015-06-02 MED ORDER — ACETAMINOPHEN 650 MG RE SUPP: 650.0000 mg | Freq: Four times a day (QID) | RECTAL | Status: DC | PRN

## 2015-06-02 MED ORDER — SODIUM CHLORIDE 0.9 % IJ SOLN
3.0000 mL | Freq: Two times a day (BID) | INTRAMUSCULAR | Status: DC
  Administered 2015-06-02: 3 mL via INTRAVENOUS

## 2015-06-02 MED ORDER — ONDANSETRON HCL 4 MG/2ML IJ SOLN: 4.0000 mg | Freq: Four times a day (QID) | INTRAMUSCULAR | Status: DC | PRN

## 2015-06-02 MED ORDER — TIZANIDINE HCL 2 MG PO TABS: 4.0000 mg | ORAL_TABLET | Freq: Three times a day (TID) | ORAL | Status: DC | PRN

## 2015-06-02 MED ORDER — LACTULOSE 10 GM/15ML PO SOLN
10.0000 g | Freq: Every day | ORAL | Status: DC
  Filled 2015-06-02 (×2): qty 15

## 2015-06-02 MED ORDER — CHLORHEXIDINE GLUCONATE CLOTH 2 % EX PADS
6.0000 | MEDICATED_PAD | Freq: Every day | CUTANEOUS | Status: DC
  Administered 2015-06-02 – 2015-06-03 (×2): 6 via TOPICAL

## 2015-06-02 MED ORDER — MUPIROCIN 2 % EX OINT
1.0000 "application " | TOPICAL_OINTMENT | Freq: Two times a day (BID) | CUTANEOUS | Status: DC
  Administered 2015-06-02 – 2015-06-03 (×3): 1 via NASAL
  Filled 2015-06-02: qty 22

## 2015-06-02 MED ORDER — INSULIN ASPART 100 UNIT/ML ~~LOC~~ SOLN
0.0000 [IU] | Freq: Three times a day (TID) | SUBCUTANEOUS | Status: DC
  Administered 2015-06-02 (×2): 2 [IU] via SUBCUTANEOUS

## 2015-06-02 MED ORDER — LEVOTHYROXINE SODIUM 25 MCG PO TABS
25.0000 ug | ORAL_TABLET | Freq: Every day | ORAL | Status: DC
  Administered 2015-06-02 – 2015-06-03 (×2): 25 ug via ORAL
  Filled 2015-06-02 (×2): qty 1

## 2015-06-02 MED ORDER — VANCOMYCIN HCL IN DEXTROSE 1-5 GM/200ML-% IV SOLN
1000.0000 mg | Freq: Once | INTRAVENOUS | Status: AC
Start: 2015-06-02 — End: 2015-06-02
  Administered 2015-06-02: 1000 mg via INTRAVENOUS
  Filled 2015-06-02: qty 200

## 2015-06-02 MED ORDER — ENOXAPARIN SODIUM 80 MG/0.8ML ~~LOC~~ SOLN
80.0000 mg | Freq: Every day | SUBCUTANEOUS | Status: DC
  Administered 2015-06-02 – 2015-06-03 (×2): 80 mg via SUBCUTANEOUS
  Filled 2015-06-02 (×2): qty 0.8

## 2015-06-02 MED ORDER — SODIUM CHLORIDE 0.9 % IV SOLN
INTRAVENOUS | Status: AC
  Administered 2015-06-02: 03:00:00 via INTRAVENOUS

## 2015-06-02 MED ORDER — SALINE SPRAY 0.65 % NA SOLN: 1.0000 | NASAL | Status: DC | PRN

## 2015-06-02 MED ORDER — VANCOMYCIN HCL 10 G IV SOLR
1750.0000 mg | INTRAVENOUS | Status: DC
  Administered 2015-06-02: 1750 mg via INTRAVENOUS
  Filled 2015-06-02 (×2): qty 1750

## 2015-06-02 MED ORDER — INSULIN ASPART 100 UNIT/ML ~~LOC~~ SOLN: 0.0000 [IU] | Freq: Every day | SUBCUTANEOUS | Status: DC

## 2015-06-02 MED ORDER — POLYETHYLENE GLYCOL 3350 17 G PO PACK
17.0000 g | PACK | Freq: Every day | ORAL | Status: DC
  Administered 2015-06-02 – 2015-06-03 (×2): 17 g via ORAL
  Filled 2015-06-02 (×2): qty 1

## 2015-06-02 NOTE — Progress Notes (Signed)
*  Preliminary Results* Bilateral lower extremity venous duplex completed. Study was technically difficult and limited due to patient body habitus, patient position, and poor patient compliance. Unable to visualize any veins of the lower extremity due to technical limitations, therefore this exam is inconclusive.  06/02/2015  Maudry Mayhew, RVT, RDCS, RDMS

## 2015-06-02 NOTE — H&P (Signed)
PCP:  Gildardo Cranker, DO    Referring provider [place]   Chief Complaint:  Feet swelling and redness  HPI: Lauren Barber is a 59 y.o. female   has a past medical history of Hypertension; Obesities, morbid; Vaginal bleeding; Endometriosis; Atopic conjunctivitis; Diabetes mellitus without complication; Cellulitis; Insomnia; Cardiomegaly; Gout; Anemia; Stroke (2013); CVA (cerebral vascular accident); Anxiety; Headache(784.0); Degenerative, intervertebral disc; History of blood transfusion (1989); Neuromuscular disorder; and Depressive disorder.   Presented with cellulitis for the past few days. Patient lives at [facility] Per notes on 29th patient was evaluated by facility physician who was noted low extremity edema right worse than left. Patient was started on Lasix 20 mg a day.  At that time patient did not endorse any shortness of breath or chest pain. She reports having a remote history of DVT.  Patient continued to have worsening swelling and redness was diagnosed with cellulitis and undergone PICC line placement on July 12. She was started on IV zosyn and Vanc through a PICC 1 days ago but patietn continued to have fever was noted to be hypotensive on although unclear how old her blood pressure go and pain in her lower extremities. Patient was sent to  ER. in ER fever up to 101.7 by bacillus, noted to be 6.5 heart rate up to 112 respirations up to 20 Patient was restarted on her IV antibody except she only got 24 hours states that she's feeling much better.  Patient reports she is bed ridden since her stroke years ago.  Patient reports history diabetes states her blood sugars currently improved She has history of long-standing anemia with baseline hemoglobin of 8 currently up to 9.7   Hospitalist was called for admission for Cellulitis  Review of Systems:    Pertinent positives include:  Fevers, chills, leg swelling and pain  Constitutional:  No weight loss, night sweats,  fatigue, weight loss  HEENT:  No headaches, Difficulty swallowing,Tooth/dental problems,Sore throat,  No sneezing, itching, ear ache, nasal congestion, post nasal drip,  Cardio-vascular:  No chest pain, Orthopnea, PND, anasarca, dizziness, palpitations.no Bilateral lower extremity swelling  GI:  No heartburn, indigestion, abdominal pain, nausea, vomiting, diarrhea, change in bowel habits, loss of appetite, melena, blood in stool, hematemesis Resp:  no shortness of breath at rest. No dyspnea on exertion, No excess mucus, no productive cough, No non-productive cough, No coughing up of blood.No change in color of mucus.No wheezing. Skin:  no rash or lesions. No jaundice GU:  no dysuria, change in color of urine, no urgency or frequency. No straining to urinate.  No flank pain.  Musculoskeletal:  No joint pain or no joint swelling. No decreased range of motion. No back pain.  Psych:  No change in mood or affect. No depression or anxiety. No memory loss.  Neuro: no localizing neurological complaints, no tingling, no weakness, no double vision, no gait abnormality, no slurred speech, no confusion  Otherwise ROS are negative except for above, 10 systems were reviewed  Past Medical History: Past Medical History  Diagnosis Date  . Hypertension   . Obesities, morbid   . Vaginal bleeding   . Endometriosis   . Atopic conjunctivitis   . Diabetes mellitus without complication   . Cellulitis     legs  . Insomnia   . Cardiomegaly   . Gout     feet  . Anemia   . Stroke 2013  . CVA (cerebral vascular accident)   . Anxiety   . Headache(784.0)  otc meds prn  . Degenerative, intervertebral disc     back, legs  . History of blood transfusion [place] - unsure number of units transfused  . Neuromuscular disorder     diabetic neuropathy - feet  . Depressive disorder     depressive disorder   Past Surgical History  Procedure Laterality Date  . Cesarean section  1982, 1989     x 2  . Biopsy endometrial N/A 10 03 2013  . Dilation and curettage of uterus  2013  . Tonsillectomy    . Wisdom tooth extraction    . Hysteroscopy w/d&c N/A 06/28/2014    Procedure: DILATATION AND CURETTAGE /HYSTEROSCOPY;  Surgeon: Lavonia Drafts, MD;  Location: [facility];  Service: Gynecology;  Laterality: N/A;     Medications: Prior to Admission medications   Medication Sig Start Date End Date Taking? Authorizing Provider  acetaminophen (TYLENOL) 325 MG tablet Take 650 mg by mouth every 6 (six) hours as needed for moderate pain, fever or headache.    Yes Historical Provider, MD  allopurinol (ZYLOPRIM) 100 MG tablet Take 200 mg by mouth daily. 05/19/13  Yes Tiffany L Reed, DO  amitriptyline (ELAVIL) 100 MG tablet Take 100 mg by mouth at bedtime.   Yes Historical Provider, MD  diphenhydrAMINE (BENADRYL) 25 MG tablet Take 25 mg by mouth every 8 (eight) hours as needed for itching. For itching   Yes Historical Provider, MD  docusate sodium (COLACE) 100 MG capsule Take 1 capsule (100 mg total) by mouth 2 (two) times daily. Patient taking differently: Take 100 mg by mouth daily.  12/19/14  Yes Ripudeep Krystal Eaton, MD  FLUoxetine (PROZAC) 20 MG capsule Take 2 capsules (40 mg total) by mouth daily. 12/13/14  Yes Gerlene Fee, NP  furosemide (LASIX) 20 MG tablet Take 20 mg by mouth daily.   Yes Historical Provider, MD  gabapentin (NEURONTIN) 300 MG capsule Take 300 mg by mouth at bedtime.   Yes Historical Provider, MD  lactulose (CHRONULAC) 10 GM/15ML solution Take 10 g by mouth daily. constipation   Yes Historical Provider, MD  levothyroxine (SYNTHROID, LEVOTHROID) 25 MCG tablet Take 25 mcg by mouth daily before breakfast.   Yes Historical Provider, MD  Melatonin 3 MG TABS Take 3 mg by mouth at bedtime.   Yes Historical Provider, MD  methocarbamol (ROBAXIN) 500 MG tablet Take 500 mg by mouth every 8 (eight) hours as needed for muscle spasms.   Yes Historical Provider, MD  Multiple  Vitamins-Minerals (MULTIVITAMIN WITH MINERALS) tablet Take 1 tablet by mouth daily.   Yes Historical Provider, MD  pantoprazole (PROTONIX) 40 MG tablet Take 1 tablet (40 mg total) by mouth daily. 12/19/14  Yes Ripudeep K Rai, MD  PATADAY 0.2 % SOLN Place 1 drop into both eyes as needed. For allergies 03/11/15  Yes Historical Provider, MD  piperacillin-tazobactam (ZOSYN) 3.375 (3-0.375) G injection Inject 3.375 g into the muscle every 8 (eight) hours.   Yes Historical Provider, MD  polyethylene glycol (MIRALAX / GLYCOLAX) packet Take 17 g by mouth daily.   Yes Historical Provider, MD  saccharomyces boulardii (FLORASTOR) 250 MG capsule Take 250 mg by mouth 2 (two) times daily.   Yes Historical Provider, MD  sodium chloride (OCEAN) 0.65 % SOLN nasal spray Place 1 spray into both nostrils as needed for congestion.   Yes Historical Provider, MD  tiZANidine (ZANAFLEX) 4 MG tablet Take 4 mg by mouth every 8 (eight) hours as needed for muscle spasms.  Yes Historical Provider, MD  traMADol (ULTRAM) 50 MG tablet Take 4 tablets (200 mg total) by mouth every 12 (twelve) hours. For pain 07/20/14  Yes Mahima Bubba Camp, MD  vancomycin (VANCOCIN) 1 GM/200ML SOLN Inject 2,000 mg into the vein every evening. Started 05/31/15, for 21 days ending 06/24/15   Yes Historical Provider, MD  EPINEPHrine (EPIPEN) 0.3 mg/0.3 mL DEVI Inject 0.3 mg into the muscle once.    Historical Provider, MD  levofloxacin (LEVAQUIN) 500 MG tablet Take 500 mg by mouth every evening.    Historical Provider, MD    Allergies:  No Known Allergies  Social History:  Ambulatory  bed bound   From facility [facility]   reports that she quit smoking about 2 years ago. Her smoking use included Cigarettes. She has a 21.5 pack-year smoking history. She has never used smokeless tobacco. She reports that she does not drink alcohol or use illicit drugs.    Family History: family history includes Hypertension in her father and mother.    Physical  Exam: Patient Vitals for the past 24 hrs:  BP Temp Temp src Pulse Resp SpO2 Height Weight  06/01/15 2309 - 100.3 F (37.9 C) Oral - - - - -  06/01/15 2230 122/62 mmHg - - 112 20 97 % - -  06/01/15 2115 136/81 mmHg - - 112 20 97 % - -  06/01/15 2111 124/82 mmHg 101.7 F (38.7 C) Oral 112 17 97 % 5\' 7"  (1.702 m) (!) 152.409 kg (336 lb)  06/01/15 2106 - - - - - 95 % - -    1. General:  in No Acute distress 2. Psychological: Alert and   Oriented 3. Head/ENT:   Moist  Mucous Membranes                          Head Non traumatic, neck supple                            Poor Dentition 4. SKIN: normal  Skin turgor,  Skin clean Dry and intact no rash 5. Heart: Regular rate and rhythm no Murmur, Rub or gallop 6. Lungs: Clear to auscultation bilaterally, no wheezes or crackles   7. Abdomen: Soft, non-tender, Non distended obese 8. Lower extremities: no clubbing, cyanosis,  Mild   edema right foot appears to be moderately erythematous, prior history of reported redness of the thighs completely resolved 9. Neurologically Grossly intact, moving all 4 extremities equally 10. MSK: Normal range of motion  body mass index is 52.61 kg/(m^2).   Labs on Admission:   Results for orders placed or performed during the hospital encounter of 06/01/15 (from the past 24 hour(s))  Comprehensive metabolic panel     Status: Abnormal   Collection Time: 06/01/15  9:14 PM  Result Value Ref Range   Sodium 131 (L) 135 - 145 mmol/L   Potassium 4.6 3.5 - 5.1 mmol/L   Chloride 99 (L) 101 - 111 mmol/L   CO2 25 22 - 32 mmol/L   Glucose, Bld 102 (H) 65 - 99 mg/dL   BUN 15 6 - 20 mg/dL   Creatinine, Ser 1.62 (H) 0.44 - 1.00 mg/dL   Calcium 8.8 (L) 8.9 - 10.3 mg/dL   Total Protein 7.8 6.5 - 8.1 g/dL   Albumin 3.0 (L) 3.5 - 5.0 g/dL   AST 22 15 - 41 U/L   ALT 19 14 - 54  U/L   Alkaline Phosphatase 97 38 - 126 U/L   Total Bilirubin 0.5 0.3 - 1.2 mg/dL   GFR calc non Af Amer 34 (L) >60 mL/min   GFR calc Af Amer 40  (L) >60 mL/min   Anion gap 7 5 - 15  CBC with Differential     Status: Abnormal   Collection Time: 06/01/15  9:14 PM  Result Value Ref Range   WBC 6.5 4.0 - 10.5 K/uL   RBC 3.18 (L) 3.87 - 5.11 MIL/uL   Hemoglobin 9.7 (L) 12.0 - 15.0 g/dL   HCT 30.0 (L) 36.0 - 46.0 %   MCV 94.3 78.0 - 100.0 fL   MCH 30.5 26.0 - 34.0 pg   MCHC 32.3 30.0 - 36.0 g/dL   RDW 14.2 11.5 - 15.5 %   Platelets 203 150 - 400 K/uL   Neutrophils Relative % 81 (H) 43 - 77 %   Neutro Abs 5.3 1.7 - 7.7 K/uL   Lymphocytes Relative 14 12 - 46 %   Lymphs Abs 0.9 0.7 - 4.0 K/uL   Monocytes Relative 3 3 - 12 %   Monocytes Absolute 0.2 0.1 - 1.0 K/uL   Eosinophils Relative 2 0 - 5 %   Eosinophils Absolute 0.2 0.0 - 0.7 K/uL   Basophils Relative 0 0 - 1 %   Basophils Absolute 0.0 0.0 - 0.1 K/uL  POC CBG, ED     Status: None   Collection Time: 06/01/15  9:19 PM  Result Value Ref Range   Glucose-Capillary 97 65 - 99 mg/dL  I-Stat CG4 Lactic Acid, ED  (not at [facility])     Status: None   Collection Time: 06/01/15  9:31 PM  Result Value Ref Range   Lactic Acid, Venous 0.92 0.5 - 2.0 mmol/L    UA ordered  Lab Results  Component Value Date   HGBA1C 6.4* 07/12/2014    Estimated Creatinine Clearance: 59.2 mL/min (by C-G formula based on Cr of 1.62).  BNP (last 3 results) No results for input(s): PROBNP in the last 8760 hours.  Other results:  I have pearsonaly reviewed this: ECG REPORT Not obtained   [facility] Weights   06/01/15 2111  Weight: 152.409 kg (336 lb)     Cultures:    Component Value Date/Time   SDES BLOOD RIGHT HAND 12/17/2014 1540   SPECREQUEST  12/17/2014 1540    BOTTLES DRAWN AEROBIC AND ANAEROBIC 10CC AER,5CC ANA   CULT  12/17/2014 1540    NO GROWTH 5 DAYS Performed at [place] 12/23/2014 FINAL 12/17/2014 1540     Radiological Exams on Admission: Dg Chest 2 View  06/01/2015   CLINICAL DATA:  Possible sepsis and cellulitis of the right leg.  EXAM: CHEST  2  VIEW  COMPARISON:  CT chest 12/15/2014  FINDINGS: Right-sided PICC line with the tip projecting over the SVC. There is no focal parenchymal opacity. There is no pleural effusion or pneumothorax. The heart and mediastinal contours are unremarkable.  The osseous structures are unremarkable.  IMPRESSION: No active cardiopulmonary disease.  Right-sided PICC line with the tip projecting over the SVC.   Electronically Signed   By: Kathreen Devoid   On: 06/01/2015 22:22   Ir Fluoro Guide Cv Line Right  05/31/2015   CLINICAL DATA:  59 year old female with cellulitis in need of venous access for outpatient IV antibiotic therapy  EXAM: IR RIGHT FLOURO GUIDE CV LINE; IR ULTRASOUND GUIDANCE VASC ACCESS RIGHT  FLUOROSCOPY TIME:  6 seconds  5.5 mGy  TECHNIQUE: The right arm was prepped with chlorhexidine, draped in the usual sterile fashion using maximum barrier technique (cap and mask, sterile gown, sterile gloves, large sterile sheet, hand hygiene and cutaneous antiseptic). Local anesthesia was attained by infiltration with 1% lidocaine.  Ultrasound demonstrated patency of the right brachia vein, and this was documented with an image. Under real-time ultrasound guidance, this vein was accessed with a 21 gauge micropuncture needle and image documentation was performed. The needle was exchanged over a guidewire for a peel-away sheath through which a 40 cm 5 [place] single lumen power injectable PICC was advanced, and positioned with its tip at the lower SVC/right atrial junction. Fluoroscopy during the procedure and fluoro spot radiograph confirms appropriate catheter position. The catheter was flushed, secured to the skin with Prolene sutures, and covered with a sterile dressing.  COMPLICATIONS: None.  The patient tolerated the procedure well.  IMPRESSION: Successful placement of a right arm PowerPICC with sonographic and fluoroscopic guidance. The catheter is ready for use.   Electronically Signed   By: Jacqulynn Cadet M.D.    On: 05/31/2015 19:02   Ir US Guide Vasc Access Right  05/31/2015   CLINICAL DATA:  59 year old female with cellulitis in need of venous access for outpatient IV antibiotic therapy  EXAM: IR RIGHT FLOURO GUIDE CV LINE; IR ULTRASOUND GUIDANCE VASC ACCESS RIGHT  FLUOROSCOPY TIME:  6 seconds  5.5 mGy  TECHNIQUE: The right arm was prepped with chlorhexidine, draped in the usual sterile fashion using maximum barrier technique (cap and mask, sterile gown, sterile gloves, large sterile sheet, hand hygiene and cutaneous antiseptic). Local anesthesia was attained by infiltration with 1% lidocaine.  Ultrasound demonstrated patency of the right brachia vein, and this was documented with an image. Under real-time ultrasound guidance, this vein was accessed with a 21 gauge micropuncture needle and image documentation was performed. The needle was exchanged over a guidewire for a peel-away sheath through which a 40 cm 5 [place] single lumen power injectable PICC was advanced, and positioned with its tip at the lower SVC/right atrial junction. Fluoroscopy during the procedure and fluoro spot radiograph confirms appropriate catheter position. The catheter was flushed, secured to the skin with Prolene sutures, and covered with a sterile dressing.  COMPLICATIONS: None.  The patient tolerated the procedure well.  IMPRESSION: Successful placement of a right arm PowerPICC with sonographic and fluoroscopic guidance. The catheter is ready for use.   Electronically Signed   By: Jacqulynn Cadet M.D.   On: 05/31/2015 19:02    Chart has been reviewed  Family not  at  Bedside    Assessment/Plan  59 year old female with history of stroke currently bedridden, history of diabetes, chronic edema disease and hypertension presents with bilateral legs swelling and redness foot to be secondary to cellulitis somewhat improving on IV into biotics but continued to be febrile admitted with  sepsis due to cellulitis  Present on Admission:    . Sepsis - admit for IV fluid rehydration, blood cultures ordered, Ambien 2 by Zosyn and vancomycin ordered lactic acid 0.87  . Essential hypertension - hold Lasix given somewhat soft blood pressure at facility  . Diabetes mellitus type 2, uncontrolled, without complications -  sliding scale  . Cellulitis -continue broad-spectrum into biotics as patient apparently has started to improve   . Chronic kidney disease (CKD) creatinine baseline 1.6  . Anemia - somewhat improved from baseline continue to monitor  . history of DVT (  deep venous thrombosis) - given edema and prior history will obtain bilateral lower extremity Dopplers  . Hyponatremia  - mild Obtain urine electrolytes given recent hypertension hold Lasix, give gentle fluids, repeat    Prophylaxis:   Lovenox   CODE STATUS:  FULL CODE  as per patient    Disposition:                           Back to current facility when stable                         Other plan as per orders.  I have spent a total of 55 min on this admission  Lauren Barber 06/02/2015, 12:36 AM  [facility]  Pager [phone]   after 2 AM please page floor coverage PA If 7AM-7PM, please contact the day team taking care of the patient  [email or web address]  Password [email or web address]

## 2015-06-02 NOTE — Care Management Note (Signed)
Case Management Note  Patient Details  Name: Lauren Barber MRN: [id number] Date of Birth: Jan 10, 1956  Subjective/Objective:      cellulitis              Action/Plan: SNF  Expected Discharge Date:  06/05/15               Expected Discharge Plan:  [place]  In-House Referral:  Clinical Social Work  Discharge planning Services  CM Consult      Status of Service:  Completed, signed off  Medicare Important Message Given:    Date Medicare IM Given:    Medicare IM give by:    Date Additional Medicare IM Given:    Additional Medicare Important Message give by:     If discussed at [place] of Stay Meetings, dates discussed:    Additional Comments: Chart reviewed. CSW following for return back to SNF.  Erenest Rasher, RN 06/02/2015, 10:32 AM

## 2015-06-02 NOTE — Progress Notes (Signed)
[facility] PROGRESS NOTE  Lauren Barber TLX:[id number] DOB: 1956/11/12 DOA: 06/01/2015  PCP: Gildardo Cranker, DO  Brief HPI: 59 year old African-American female who lives in a nursing facility with a medical history of hypertension, obesity, endometriosis, diabetes, history of stroke, presented with complaints of swelling and redness of her right leg. This was noticed a few days ago at her nursing facility. Plan was to initiate IV antibiotics. A PICC line was placed yesterday and she was supposed to be started on vancomycin and Zosyn. But then she developed fevers and apparently had low blood pressures as well and so she was sent over to the emergency department. She was subsequently hospitalized for further management.  Past medical history:  Past Medical History  Diagnosis Date  . Hypertension   . Obesities, morbid   . Vaginal bleeding   . Endometriosis   . Atopic conjunctivitis   . Diabetes mellitus without complication   . Cellulitis     legs  . Insomnia   . Cardiomegaly   . Gout     feet  . Anemia   . Stroke 2013  . CVA (cerebral vascular accident)   . Anxiety   . Headache(784.0)     otc meds prn  . Degenerative, intervertebral disc     back, legs  . History of blood transfusion [place] - unsure number of units transfused  . Neuromuscular disorder     diabetic neuropathy - feet  . Depressive disorder     depressive disorder    Consultants: None  Procedures:  Lower extremity Doppler study is pending  Antibiotics: Intravenous, vancomycin and and Zosyn 7/13  Subjective: Patient feels better this morning. The right leg is not as painful. Denies any nausea or vomiting.  Objective: Vital Signs  Filed Vitals:   06/02/15 0045 06/02/15 0100 06/02/15 0200 06/02/15 0430  BP: 130/76 124/80 121/88 114/66  Pulse: 97 101 100 99  Temp:   99.1 F (37.3 C) 98.6 F (37 C)  TempSrc:   Oral Oral  Resp: 16 20 20 18   Height:   5\' 7"  (1.702 m)     Weight:   167.377 kg (369 lb)   SpO2: 93% 100% 100% 100%    Intake/Output Summary (Last 24 hours) at 06/02/15 0837 Last data filed at 06/02/15 0640  Gross per 24 hour  Intake  560.5 ml  Output      0 ml  Net  560.5 ml   Filed Weights   06/01/15 2111 06/02/15 0200  Weight: 152.409 kg (336 lb) 167.377 kg (369 lb)    General appearance: alert, cooperative, appears stated age, no distress and morbidly obese Resp: clear to auscultation bilaterally Cardio: regular rate and rhythm, S1, S2 normal, no murmur, click, rub or gallop GI: soft, non-tender; bowel sounds normal; no masses,  no organomegaly Extremities: Some warmth appreciated in the right lower extremity. Nontender. No masses appreciated. Good distal pulses. Neurologic: Left-sided hemiparesis from previous stroke.  Lab Results:  Basic Metabolic Panel:  Recent Labs Lab 06/01/15 2114 06/02/15 0337  NA 131* 132*  K 4.6 4.1  CL 99* 101  CO2 25 23  GLUCOSE 102* 130*  BUN 15 15  CREATININE 1.62* 1.62*  CALCIUM 8.8* 8.5*  MG  --  1.6*  PHOS  --  3.2   Liver Function Tests:  Recent Labs Lab 06/01/15 2114 06/02/15 0337  AST 22 23  ALT 19 19  ALKPHOS 97 99  BILITOT 0.5 0.5  PROT 7.8  7.1  ALBUMIN 3.0* 2.8*   CBC:  Recent Labs Lab 06/01/15 2114 06/02/15 0337  WBC 6.5 5.3  NEUTROABS 5.3  --   HGB 9.7* 9.8*  HCT 30.0* 30.4*  MCV 94.3 94.7  PLT 203 189   CBG:  Recent Labs Lab 06/01/15 2119 06/02/15 0733  GLUCAP 97 133*    Recent Results (from the past 240 hour(s))  MRSA PCR Screening     Status: Abnormal   Collection Time: 06/02/15  2:39 AM  Result Value Ref Range Status   MRSA by PCR POSITIVE (A) NEGATIVE Final    Comment:        The GeneXpert MRSA Assay (FDA approved for NASAL specimens only), is one component of a comprehensive MRSA colonization surveillance program. It is not intended to diagnose MRSA infection nor to guide or monitor treatment for MRSA infections. RESULT CALLED TO,  READ BACK BY AND VERIFIED WITH: CARSON,J RN [id number] AT 0503 SKEEN,P       Studies/Results: Dg Chest 2 View  06/01/2015   CLINICAL DATA:  Possible sepsis and cellulitis of the right leg.  EXAM: CHEST  2 VIEW  COMPARISON:  CT chest 12/15/2014  FINDINGS: Right-sided PICC line with the tip projecting over the SVC. There is no focal parenchymal opacity. There is no pleural effusion or pneumothorax. The heart and mediastinal contours are unremarkable.  The osseous structures are unremarkable.  IMPRESSION: No active cardiopulmonary disease.  Right-sided PICC line with the tip projecting over the SVC.   Electronically Signed   By: Kathreen Devoid   On: 06/01/2015 22:22   Ir Fluoro Guide Cv Line Right  05/31/2015   CLINICAL DATA:  58 year old female with cellulitis in need of venous access for outpatient IV antibiotic therapy  EXAM: IR RIGHT FLOURO GUIDE CV LINE; IR ULTRASOUND GUIDANCE VASC ACCESS RIGHT  FLUOROSCOPY TIME:  6 seconds  5.5 mGy  TECHNIQUE: The right arm was prepped with chlorhexidine, draped in the usual sterile fashion using maximum barrier technique (cap and mask, sterile gown, sterile gloves, large sterile sheet, hand hygiene and cutaneous antiseptic). Local anesthesia was attained by infiltration with 1% lidocaine.  Ultrasound demonstrated patency of the right brachia vein, and this was documented with an image. Under real-time ultrasound guidance, this vein was accessed with a 21 gauge micropuncture needle and image documentation was performed. The needle was exchanged over a guidewire for a peel-away sheath through which a 40 cm 5 [place] single lumen power injectable PICC was advanced, and positioned with its tip at the lower SVC/right atrial junction. Fluoroscopy during the procedure and fluoro spot radiograph confirms appropriate catheter position. The catheter was flushed, secured to the skin with Prolene sutures, and covered with a sterile dressing.  COMPLICATIONS: None.  The patient tolerated  the procedure well.  IMPRESSION: Successful placement of a right arm PowerPICC with sonographic and fluoroscopic guidance. The catheter is ready for use.   Electronically Signed   By: Jacqulynn Cadet M.D.   On: 05/31/2015 19:02   Ir US Guide Vasc Access Right  05/31/2015   CLINICAL DATA:  59 year old female with cellulitis in need of venous access for outpatient IV antibiotic therapy  EXAM: IR RIGHT FLOURO GUIDE CV LINE; IR ULTRASOUND GUIDANCE VASC ACCESS RIGHT  FLUOROSCOPY TIME:  6 seconds  5.5 mGy  TECHNIQUE: The right arm was prepped with chlorhexidine, draped in the usual sterile fashion using maximum barrier technique (cap and mask, sterile gown, sterile gloves, large sterile sheet, hand hygiene and cutaneous antiseptic).  Local anesthesia was attained by infiltration with 1% lidocaine.  Ultrasound demonstrated patency of the right brachia vein, and this was documented with an image. Under real-time ultrasound guidance, this vein was accessed with a 21 gauge micropuncture needle and image documentation was performed. The needle was exchanged over a guidewire for a peel-away sheath through which a 40 cm 5 [place] single lumen power injectable PICC was advanced, and positioned with its tip at the lower SVC/right atrial junction. Fluoroscopy during the procedure and fluoro spot radiograph confirms appropriate catheter position. The catheter was flushed, secured to the skin with Prolene sutures, and covered with a sterile dressing.  COMPLICATIONS: None.  The patient tolerated the procedure well.  IMPRESSION: Successful placement of a right arm PowerPICC with sonographic and fluoroscopic guidance. The catheter is ready for use.   Electronically Signed   By: Jacqulynn Cadet M.D.   On: 05/31/2015 19:02    Medications:  Scheduled: . allopurinol  200 mg Oral Daily  . amitriptyline  100 mg Oral QHS  . Chlorhexidine Gluconate Cloth  6 each Topical Q0600  . docusate sodium  100 mg Oral Daily  . enoxaparin  (LOVENOX) injection  80 mg Subcutaneous Daily  . FLUoxetine  40 mg Oral Daily  . gabapentin  300 mg Oral QHS  . insulin aspart  0-15 Units Subcutaneous TID WC  . insulin aspart  0-5 Units Subcutaneous QHS  . lactulose  10 g Oral Daily  . levothyroxine  25 mcg Oral QAC breakfast  . magnesium oxide  400 mg Oral Daily  . mupirocin ointment  1 application Nasal BID  . pantoprazole  40 mg Oral Daily  . piperacillin-tazobactam (ZOSYN)  IV  3.375 g Intravenous Q8H  . polyethylene glycol  17 g Oral Daily  . saccharomyces boulardii  250 mg Oral BID  . sodium chloride  3 mL Intravenous Q12H  . traMADol  200 mg Oral Q12H  . vancomycin  1,750 mg Intravenous Q24H   Continuous:  LGX:[id number] **OR** acetaminophen, HYDROcodone-acetaminophen, methocarbamol, ondansetron **OR** ondansetron (ZOFRAN) IV, sodium chloride, tiZANidine  Assessment/Plan:  Active Problems:   Essential hypertension   Anemia   Diabetes mellitus type 2, uncontrolled, without complications   DVT (deep venous thrombosis)   Chronic kidney disease (CKD)   Cellulitis   Sepsis   Hyponatremia    Right lower extremity cellulitis with sepsis Patient is improving. Lactic acid levels are normal. Continue with intravenous vancomycin and Zosyn. Blood cultures negative so far. She is symptomatically improved. Lower extremity Doppler study is pending. This was ordered due to her history of DVTs.  History of essential hypertension Holding her Lasix. Blood pressure stable.  History of diabetes mellitus type 2 Unable to determine what her home medication regimen has been. Currently on sliding scale coverage.  History of chronic kidney disease stage III Stable. Monitor urine output.  Normocytic anemia Hemoglobin stable. Continue to monitor. No overt bleeding.  Mild hyponatremia Continue to monitor. Holding her diuretics.   DVT Prophylaxis: Lovenox    Code Status: Full code  Family Communication: Discussed with the patient   Disposition Plan: Will return to skilled nursing facility when stable for discharge. She will be discharged on IV antibiotics. This can be transitioned to oral antibiotics by the physicians at the skilled nursing facility next week.     LOS: 0 days   [facility] Pager [phone] 06/02/2015, 8:37 AM  If 7PM-7AM, please contact night-coverage at [email or web address], password [facility]

## 2015-06-03 LAB — BASIC METABOLIC PANEL
Anion gap: 9 (ref 5–15)
BUN: 13 mg/dL (ref 6–20)
CALCIUM: 8.7 mg/dL — AB (ref 8.9–10.3)
CO2: 24 mmol/L (ref 22–32)
CREATININE: 1.55 mg/dL — AB (ref 0.44–1.00)
Chloride: 101 mmol/L (ref 101–111)
GFR, EST AFRICAN AMERICAN: 42 mL/min — AB (ref >60)
GFR, EST NON AFRICAN AMERICAN: 36 mL/min — AB (ref >60)
GLUCOSE: 130 mg/dL — AB (ref 65–99)
Potassium: 4.3 mmol/L (ref 3.5–5.1)
Sodium: 134 mmol/L — ABNORMAL LOW (ref 135–145)

## 2015-06-03 LAB — CBC
HEMATOCRIT: 28.8 % — AB (ref 36.0–46.0)
Hemoglobin: 9.5 g/dL — ABNORMAL LOW (ref 12.0–15.0)
MCH: 31.7 pg (ref 26.0–34.0)
MCHC: 33 g/dL (ref 30.0–36.0)
MCV: 96 fL (ref 78.0–100.0)
Platelets: 192 10*3/uL (ref 150–400)
RBC: 3 MIL/uL — AB (ref 3.87–5.11)
RDW: 14.4 % (ref 11.5–15.5)
WBC: 5 10*3/uL (ref 4.0–10.5)

## 2015-06-03 LAB — HEMOGLOBIN A1C
Hgb A1c MFr Bld: 6.3 % — ABNORMAL HIGH (ref 4.8–5.6)
Mean Plasma Glucose: 134 mg/dL

## 2015-06-03 LAB — GLUCOSE, CAPILLARY
GLUCOSE-CAPILLARY: 115 mg/dL — AB (ref 65–99)
Glucose-Capillary: 116 mg/dL — ABNORMAL HIGH (ref 65–99)
Glucose-Capillary: 119 mg/dL — ABNORMAL HIGH (ref 65–99)

## 2015-06-03 MED ORDER — VANCOMYCIN HCL IN DEXTROSE 1-5 GM/200ML-% IV SOLN: 2000.0000 mg | Freq: Every evening | INTRAVENOUS | Status: DC

## 2015-06-03 MED ORDER — HEPARIN SOD (PORK) LOCK FLUSH 100 UNIT/ML IV SOLN
250.0000 [IU] | INTRAVENOUS | Status: AC | PRN
  Administered 2015-06-03: 250 [IU]

## 2015-06-03 MED ORDER — MAGNESIUM OXIDE 400 (241.3 MG) MG PO TABS: 400.0000 mg | ORAL_TABLET | Freq: Every day | ORAL | Status: DC

## 2015-06-03 MED ORDER — PIPERACILLIN SOD-TAZOBACTAM SO 3.375 (3-0.375) G IV SOLR: 3.3750 g | Freq: Three times a day (TID) | INTRAVENOUS | Status: DC

## 2015-06-03 MED ORDER — TRAMADOL HCL 50 MG PO TABS: 200.0000 mg | ORAL_TABLET | Freq: Two times a day (BID) | ORAL | Status: DC

## 2015-06-03 MED ORDER — SODIUM CHLORIDE 0.9 % IJ SOLN
10.0000 mL | INTRAMUSCULAR | Status: DC | PRN
  Administered 2015-06-03 (×2): 10 mL
  Filled 2015-06-03 (×2): qty 40

## 2015-06-03 NOTE — Clinical Social Work Note (Signed)
Clinical Social Work Assessment  Patient Details  Name: Lauren Barber MRN: [id number] Date of Birth: 07/21/1956  Date of referral:  06/02/15               Reason for consult:  Facility Placement                Permission sought to share information with:  Family Supports Permission granted to share information::  Yes, Verbal Permission Granted (Patient will notify her mother.)  Name::        Agency::  SNF Placement  Relationship::     Contact Information:     Housing/Transportation Living arrangements for the past 2 months:  [place] of Information:  Patient Patient Interpreter Needed:  None Criminal Activity/Legal Involvement Pertinent to Current Situation/Hospitalization:  No - Comment as needed Significant Relationships:  Parents Lives with:  Facility Resident Do you feel safe going back to the place where you live?  Yes Need for family participation in patient care:  No (Coment) (Patient is alert and oriented and able to make her own decisions)  Care giving concerns: Patient is a SNF as a long term care resident, she is accepting that she has to stay there.   Social Worker assessment / plan: Patient is a 59 year old female who is a resident at [facility].  Patient stated she does not enjoy staying there, but understands she has to because of her medical conditions.  Patient stated she is on a wait list to find disabled housing options, but has not found one yet.  Patient is alert and oriented x4 and was able to express her feelings and frustrations with the health care system and the lack of resources in the community for people who are disabled.  Patient states she feels out of place at SNF because so many people are older than her, and have worse conditions then she does.  Patient states she hopes she will eventually be able to leave, but understands that currently she needs to be at [facility].  Patient was appreciative of CSW conversation with her  and is ready to discharge from hospital as soon as she can.  Employment status:  Disabled (Comment on whether or not currently receiving Disability) Insurance information:  Medicare, Medicaid In [place] PT Recommendations:  Not assessed at this time Information / Referral to community resources:     Patient/Family's Response to care:  Patient is in agreement to returning to SNF  Patient/Family's Understanding of and Emotional Response to Diagnosis, Current Treatment, and Prognosis:  Patient is frustrated at the system and lack of resources available from the community for people who are disabled.  She is hopeful the system will change some day.  Patient is aware that she needs to go to SNF to help with her diagnosis.  Emotional Assessment Appearance:  Appears stated age Attitude/Demeanor/Rapport:    Affect (typically observed):  Accepting, Appropriate, Pleasant Orientation:  Oriented to Self, Oriented to Place, Oriented to  Time, Oriented to Situation Alcohol / Substance use:  Not Applicable Psych involvement (Current and /or in the community):  No (Comment)  Discharge Needs  Concerns to be addressed:  No discharge needs identified Readmission within the last 30 days:  No Current discharge risk:  None Barriers to Discharge:  No Barriers Identified   Ross Ludwig, LCSWA 06/03/2015, 11:50 AM

## 2015-06-03 NOTE — Discharge Instructions (Signed)

## 2015-06-03 NOTE — Discharge Summary (Signed)
[facility]  Physician Discharge Summary   Patient ID: Lauren Barber MRN: [id number] DOB/AGE: 1956/10/27 59 y.o.  Admit date: 06/01/2015 Discharge date: 06/03/2015  PCP: Gildardo Cranker, DO  DISCHARGE DIAGNOSES:  Active Problems:   Essential hypertension   Anemia   Diabetes mellitus type 2, uncontrolled, without complications   Chronic kidney disease (CKD)   Cellulitis   Sepsis   Hyponatremia   RECOMMENDATIONS FOR OUTPATIENT FOLLOW UP: 1. Duration of vancomycin and Zosyn to be determined by SNF providers 2. There was a mention of diabetes as being one of her chronic medical conditions, but patient isn't not noted to be on any diabetic medications. Please address. 3. Blood cultures are pending   DISCHARGE CONDITION: fair  Diet recommendation: As before  [facility] Weights   06/01/15 2111 06/02/15 0200  Weight: 152.409 kg (336 lb) 167.377 kg (369 lb)    INITIAL HISTORY: 59 year old African-American female who lives in a nursing facility with a medical history of hypertension, obesity, endometriosis, questionable diabetes, history of stroke, presented with complaints of swelling and redness of her right leg. This was noticed a few days ago at her nursing facility. Plan was to initiate IV antibiotics. A PICC line was placed yesterday and she was supposed to be started on vancomycin and Zosyn. But then she developed fevers and apparently had low blood pressures as well and so she was sent over to the emergency department. She was subsequently hospitalized for further management.  Procedures:  Lower extremity Doppler study was inconclusive.  HOSPITAL COURSE:   Right lower extremity cellulitis with sepsis Patient was sent over from SNF due to fever, chills, and borderline low blood pressures. Patient was started on IV antibiotics with vancomycin and Zosyn as was the plan at the SNF. Lactic acid levels were normal. Blood cultures are negative so far. Patient feels much  better. Her pain and swelling of the right leg has improved. Lower extremity Doppler study was ordered due to history of DVT, but it was an inconclusive study. Considering her clinical improvement. This appears to be more of a cellulitis. We can hold off on further testing for now. Duration of antibiotics to be determined by the SNF providers.   History of essential hypertension Due to history of fall borderline low blood pressures at the skilled nursing facility, her oral agents were held. These can be resumed. Blood pressures have been stable.   Questionable History of diabetes mellitus type 2 No mention of diabetic medications on her medication list. CBGs have not been that elevated. Hemoglobin A1c 6.3. SNF to address further.   History of chronic kidney disease stage III Stable.   Normocytic anemia Hemoglobin stable.   Mild hyponatremia Improved this morning. Can be followed at the skilled nursing facility.  Overall, patient is feeling much better. Very keen on going back to her facility. She can be discharged today.   PERTINENT LABS:  The results of significant diagnostics from this hospitalization (including imaging, microbiology, ancillary and laboratory) are listed below for reference.    Microbiology: Recent Results (from the past 240 hour(s))  Culture, blood (routine x 2)     Status: None (Preliminary result)   Collection Time: 06/01/15  9:14 PM  Result Value Ref Range Status   Specimen Description BLOOD PICC LINE  Final   Special Requests BOTTLES DRAWN AEROBIC AND ANAEROBIC 8CC  Final   Culture NO GROWTH < 24 HOURS  Final   Report Status PENDING  Incomplete  Culture, blood (routine x 2)  Status: None (Preliminary result)   Collection Time: 06/01/15  9:21 PM  Result Value Ref Range Status   Specimen Description BLOOD LEFT HAND  Final   Special Requests BOTTLES DRAWN AEROBIC AND ANAEROBIC 5CC  Final   Culture NO GROWTH < 24 HOURS  Final   Report Status PENDING   Incomplete  MRSA PCR Screening     Status: Abnormal   Collection Time: 06/02/15  2:39 AM  Result Value Ref Range Status   MRSA by PCR POSITIVE (A) NEGATIVE Final    Comment:        The GeneXpert MRSA Assay (FDA approved for NASAL specimens only), is one component of a comprehensive MRSA colonization surveillance program. It is not intended to diagnose MRSA infection nor to guide or monitor treatment for MRSA infections. RESULT CALLED TO, READ BACK BY AND VERIFIED WITH: CARSON,J RN [id number] AT 0503 SKEEN,P      Labs: Basic Metabolic Panel:  Recent Labs Lab 06/01/15 2114 06/02/15 0337 06/03/15 0542  NA 131* 132* 134*  K 4.6 4.1 4.3  CL 99* 101 101  CO2 25 23 24   GLUCOSE 102* 130* 130*  BUN 15 15 13   CREATININE 1.62* 1.62* 1.55*  CALCIUM 8.8* 8.5* 8.7*  MG  --  1.6*  --   PHOS  --  3.2  --    Liver Function Tests:  Recent Labs Lab 06/01/15 2114 06/02/15 0337  AST 22 23  ALT 19 19  ALKPHOS 97 99  BILITOT 0.5 0.5  PROT 7.8 7.1  ALBUMIN 3.0* 2.8*   CBC:  Recent Labs Lab 06/01/15 2114 06/02/15 0337 06/03/15 0542  WBC 6.5 5.3 5.0  NEUTROABS 5.3  --   --   HGB 9.7* 9.8* 9.5*  HCT 30.0* 30.4* 28.8*  MCV 94.3 94.7 96.0  PLT 203 189 192   CBG:  Recent Labs Lab 06/02/15 0733 06/02/15 1142 06/02/15 1701 06/02/15 2144 06/03/15 0813  GLUCAP 133* 127* 101* 116* 115*     IMAGING STUDIES Dg Chest 2 View  06/01/2015   CLINICAL DATA:  Possible sepsis and cellulitis of the right leg.  EXAM: CHEST  2 VIEW  COMPARISON:  CT chest 12/15/2014  FINDINGS: Right-sided PICC line with the tip projecting over the SVC. There is no focal parenchymal opacity. There is no pleural effusion or pneumothorax. The heart and mediastinal contours are unremarkable.  The osseous structures are unremarkable.  IMPRESSION: No active cardiopulmonary disease.  Right-sided PICC line with the tip projecting over the SVC.   Electronically Signed   By: Kathreen Devoid   On: 06/01/2015 22:22    Ir Fluoro Guide Cv Line Right  05/31/2015   CLINICAL DATA:  59 year old female with cellulitis in need of venous access for outpatient IV antibiotic therapy  EXAM: IR RIGHT FLOURO GUIDE CV LINE; IR ULTRASOUND GUIDANCE VASC ACCESS RIGHT  FLUOROSCOPY TIME:  6 seconds  5.5 mGy  TECHNIQUE: The right arm was prepped with chlorhexidine, draped in the usual sterile fashion using maximum barrier technique (cap and mask, sterile gown, sterile gloves, large sterile sheet, hand hygiene and cutaneous antiseptic). Local anesthesia was attained by infiltration with 1% lidocaine.  Ultrasound demonstrated patency of the right brachia vein, and this was documented with an image. Under real-time ultrasound guidance, this vein was accessed with a 21 gauge micropuncture needle and image documentation was performed. The needle was exchanged over a guidewire for a peel-away sheath through which a 40 cm 5 [place] single lumen power injectable PICC was  advanced, and positioned with its tip at the lower SVC/right atrial junction. Fluoroscopy during the procedure and fluoro spot radiograph confirms appropriate catheter position. The catheter was flushed, secured to the skin with Prolene sutures, and covered with a sterile dressing.  COMPLICATIONS: None.  The patient tolerated the procedure well.  IMPRESSION: Successful placement of a right arm PowerPICC with sonographic and fluoroscopic guidance. The catheter is ready for use.   Electronically Signed   By: Jacqulynn Cadet M.D.   On: 05/31/2015 19:02   Ir US Guide Vasc Access Right  05/31/2015   CLINICAL DATA:  59 year old female with cellulitis in need of venous access for outpatient IV antibiotic therapy  EXAM: IR RIGHT FLOURO GUIDE CV LINE; IR ULTRASOUND GUIDANCE VASC ACCESS RIGHT  FLUOROSCOPY TIME:  6 seconds  5.5 mGy  TECHNIQUE: The right arm was prepped with chlorhexidine, draped in the usual sterile fashion using maximum barrier technique (cap and mask, sterile gown, sterile  gloves, large sterile sheet, hand hygiene and cutaneous antiseptic). Local anesthesia was attained by infiltration with 1% lidocaine.  Ultrasound demonstrated patency of the right brachia vein, and this was documented with an image. Under real-time ultrasound guidance, this vein was accessed with a 21 gauge micropuncture needle and image documentation was performed. The needle was exchanged over a guidewire for a peel-away sheath through which a 40 cm 5 [place] single lumen power injectable PICC was advanced, and positioned with its tip at the lower SVC/right atrial junction. Fluoroscopy during the procedure and fluoro spot radiograph confirms appropriate catheter position. The catheter was flushed, secured to the skin with Prolene sutures, and covered with a sterile dressing.  COMPLICATIONS: None.  The patient tolerated the procedure well.  IMPRESSION: Successful placement of a right arm PowerPICC with sonographic and fluoroscopic guidance. The catheter is ready for use.   Electronically Signed   By: Jacqulynn Cadet M.D.   On: 05/31/2015 19:02    DISCHARGE EXAMINATION: Filed Vitals:   06/02/15 0430 06/02/15 1546 06/02/15 2143 06/03/15 0450  BP: 114/66 121/68 138/80 138/77  Pulse: 99 97 98 101  Temp: 98.6 F (37 C) 98.6 F (37 C) 99.1 F (37.3 C) 98.9 F (37.2 C)  TempSrc: Oral Oral Oral Oral  Resp: 18 18 18 18   Height:      Weight:      SpO2: 100% 100% 99% 100%   General appearance: alert, cooperative, appears stated age and no distress Resp: clear to auscultation bilaterally Cardio: regular rate and rhythm, S1, S2 normal, no murmur, click, rub or gallop GI: soft, non-tender; bowel sounds normal; no masses,  no organomegaly Extremities: Improved erythema and swelling of the right leg. Some warmth is still present.  DISPOSITION: SNF  Discharge Instructions    Call MD for:  extreme fatigue    Complete by:  As directed      Call MD for:  persistant dizziness or light-headedness     Complete by:  As directed      Call MD for:  persistant nausea and vomiting    Complete by:  As directed      Call MD for:  severe uncontrolled pain    Complete by:  As directed      Call MD for:  temperature >100.4    Complete by:  As directed      Discharge instructions    Complete by:  As directed   Duration of IV antibiotics per SNF providers.  You were cared for by a hospitalist during  your hospital stay. If you have any questions about your discharge medications or the care you received while you were in the hospital after you are discharged, you can call the unit and asked to speak with the hospitalist on call if the hospitalist that took care of you is not available. Once you are discharged, your primary care physician will handle any further medical issues. Please note that NO REFILLS for any discharge medications will be authorized once you are discharged, as it is imperative that you return to your primary care physician (or establish a relationship with a primary care physician if you do not have one) for your aftercare needs so that they can reassess your need for medications and monitor your lab values. If you do not have a primary care physician, you can call [phone] for a physician referral.     Increase activity slowly    Complete by:  As directed            ALLERGIES: No Known Allergies   Current Discharge Medication List    START taking these medications   Details  magnesium oxide (MAG-OX) 400 (241.3 MG) MG tablet Take 1 tablet (400 mg total) by mouth daily. For 14 days.      CONTINUE these medications which have CHANGED   Details  piperacillin-tazobactam (ZOSYN) 3.375 (3-0.375) G injection Inject 3,375 mg (3.375 g total) into the muscle every 8 (eight) hours. Per pharmacy protocols. Duration per SNF providers. Qty: 1 each    traMADol (ULTRAM) 50 MG tablet Take 4 tablets (200 mg total) by mouth every 12 (twelve) hours. For pain Qty: 30 tablet, Refills: 0      vancomycin (VANCOCIN) 1 GM/200ML SOLN Inject 400 mLs (2,000 mg total) into the vein every evening. Per pharmacy protocols. Duration per SNF providers depending on clinical progress. Qty: 4000 mL      CONTINUE these medications which have NOT CHANGED   Details  acetaminophen (TYLENOL) 325 MG tablet Take 650 mg by mouth every 6 (six) hours as needed for moderate pain, fever or headache.     allopurinol (ZYLOPRIM) 100 MG tablet Take 200 mg by mouth daily.    amitriptyline (ELAVIL) 100 MG tablet Take 100 mg by mouth at bedtime.    diphenhydrAMINE (BENADRYL) 25 MG tablet Take 25 mg by mouth every 8 (eight) hours as needed for itching. For itching    docusate sodium (COLACE) 100 MG capsule Take 1 capsule (100 mg total) by mouth 2 (two) times daily. Qty: 10 capsule, Refills: 0    FLUoxetine (PROZAC) 20 MG capsule Take 2 capsules (40 mg total) by mouth daily. Qty: 120 capsule, Refills: 11   Associated Diagnoses: Depression    furosemide (LASIX) 20 MG tablet Take 20 mg by mouth daily.    gabapentin (NEURONTIN) 300 MG capsule Take 300 mg by mouth at bedtime.    lactulose (CHRONULAC) 10 GM/15ML solution Take 10 g by mouth daily. constipation    levothyroxine (SYNTHROID, LEVOTHROID) 25 MCG tablet Take 25 mcg by mouth daily before breakfast.    Melatonin 3 MG TABS Take 3 mg by mouth at bedtime.    methocarbamol (ROBAXIN) 500 MG tablet Take 500 mg by mouth every 8 (eight) hours as needed for muscle spasms.    Multiple Vitamins-Minerals (MULTIVITAMIN WITH MINERALS) tablet Take 1 tablet by mouth daily.    pantoprazole (PROTONIX) 40 MG tablet Take 1 tablet (40 mg total) by mouth daily.    PATADAY 0.2 % SOLN Place 1 drop  into both eyes as needed. For allergies    polyethylene glycol (MIRALAX / GLYCOLAX) packet Take 17 g by mouth daily.    saccharomyces boulardii (FLORASTOR) 250 MG capsule Take 250 mg by mouth 2 (two) times daily.    sodium chloride (OCEAN) 0.65 % SOLN nasal spray Place 1  spray into both nostrils as needed for congestion.    tiZANidine (ZANAFLEX) 4 MG tablet Take 4 mg by mouth every 8 (eight) hours as needed for muscle spasms.    EPINEPHrine (EPIPEN) 0.3 mg/0.3 mL DEVI Inject 0.3 mg into the muscle once.      STOP taking these medications     levofloxacin (LEVAQUIN) 500 MG tablet        Follow-up Information    Follow up with Gildardo Cranker, DO In 1 week.   Specialty:  Internal Medicine   Why:  post hospitalization follow up   Contact information:   [place] [address] [phone]       TOTAL DISCHARGE TIME: 55 minutes  [facility] Pager [phone]  06/03/2015, 10:54 AM

## 2015-06-03 NOTE — Clinical Social Work Note (Signed)
Patient to be d/c'ed today to [facility].  Patient and family agreeable to plans will transport via ems RN to call report.  Patient said she will let her mom know.  Evette Cristal, MSW, [place]

## 2015-06-03 NOTE — Progress Notes (Signed)
Pt discharged back to [facility] by transport.  Report called and given to [facility], RN.  Pt has SL PICC to RT UA that remained intact.  Pt had no questions at the time of discharge.

## 2015-06-04 LAB — URINE CULTURE: Culture: 3000

## 2015-06-06 ENCOUNTER — Encounter: Payer: Self-pay | Admitting: Adult Health

## 2015-06-06 ENCOUNTER — Non-Acute Institutional Stay (SKILLED_NURSING_FACILITY): Payer: Medicare Other | Admitting: Adult Health

## 2015-06-06 DIAGNOSIS — M109 Gout, unspecified: Secondary | ICD-10-CM

## 2015-06-06 DIAGNOSIS — G8929 Other chronic pain: Secondary | ICD-10-CM | POA: Diagnosis not present

## 2015-06-06 DIAGNOSIS — E1165 Type 2 diabetes mellitus with hyperglycemia: Secondary | ICD-10-CM | POA: Diagnosis not present

## 2015-06-06 DIAGNOSIS — K5901 Slow transit constipation: Secondary | ICD-10-CM | POA: Diagnosis not present

## 2015-06-06 DIAGNOSIS — I10 Essential (primary) hypertension: Secondary | ICD-10-CM

## 2015-06-06 DIAGNOSIS — E1143 Type 2 diabetes mellitus with diabetic autonomic (poly)neuropathy: Secondary | ICD-10-CM

## 2015-06-06 DIAGNOSIS — E039 Hypothyroidism, unspecified: Secondary | ICD-10-CM | POA: Diagnosis not present

## 2015-06-06 DIAGNOSIS — L03115 Cellulitis of right lower limb: Secondary | ICD-10-CM

## 2015-06-06 DIAGNOSIS — IMO0001 Reserved for inherently not codable concepts without codable children: Secondary | ICD-10-CM

## 2015-06-06 LAB — CULTURE, BLOOD (ROUTINE X 2)
Culture: NO GROWTH
Culture: NO GROWTH

## 2015-06-06 NOTE — Progress Notes (Signed)
Patient ID: Lauren Barber, female   DOB: January 13, 1956, 59 y.o.   MRN: [id number]  Lauren Barber living [place]      No Known Allergies     Chief Complaint  Patient presents with  . Hospitalization Follow-up    HPI:  She is a long term resident of this facility who was hospitalized for   Past Medical History  Diagnosis Date  . Hypertension   . Obesities, morbid   . Vaginal bleeding   . Endometriosis   . Atopic conjunctivitis   . Diabetes mellitus without complication   . Cellulitis     legs  . Insomnia   . Cardiomegaly   . Gout     feet  . Anemia   . Stroke 2013  . CVA (cerebral vascular accident)   . Anxiety   . Headache(784.0)     otc meds prn  . Degenerative, intervertebral disc     back, legs  . History of blood transfusion [place] - unsure number of units transfused  . Neuromuscular disorder     diabetic neuropathy - feet  . Depressive disorder     depressive disorder    Past Surgical History  Procedure Laterality Date  . Cesarean section  1982, 1989    x 2  . Biopsy endometrial N/A 10 03 2013  . Dilation and curettage of uterus  2013  . Tonsillectomy    . Wisdom tooth extraction    . Hysteroscopy w/d&c N/A 06/28/2014    Procedure: DILATATION AND CURETTAGE /HYSTEROSCOPY;  Surgeon: Lavonia Drafts, MD;  Location: [facility];  Service: Gynecology;  Laterality: N/A;    VITAL SIGNS BP 157/80 mmHg  Pulse 86  Ht 5\' 9"  (1.753 m)  Wt 362 lb (164.202 kg)  BMI 53.43 kg/m2  SpO2 96%  Patient's Medications  New Prescriptions   No medications on file  Previous Medications   ACETAMINOPHEN (TYLENOL) 325 MG TABLET    Take 650 mg by mouth every 6 (six) hours as needed for moderate pain, fever or headache.    ALLOPURINOL (ZYLOPRIM) 100 MG TABLET    Take 200 mg by mouth daily.   AMITRIPTYLINE (ELAVIL) 100 MG TABLET    Take 100 mg by mouth at bedtime.   DIPHENHYDRAMINE (BENADRYL) 25 MG TABLET    Take 25 mg by mouth every 8 (eight) hours as needed  for itching. For itching   DOCUSATE SODIUM (COLACE) 100 MG CAPSULE    Take 1 capsule (100 mg total) by mouth 2 (two) times daily.   EPINEPHRINE (EPIPEN) 0.3 MG/0.3 ML DEVI    Inject 0.3 mg into the muscle once.   FLUOXETINE (PROZAC) 20 MG CAPSULE    Take 2 capsules (40 mg total) by mouth daily.   FUROSEMIDE (LASIX) 20 MG TABLET    Take 20 mg by mouth daily.   GABAPENTIN (NEURONTIN) 300 MG CAPSULE    Take 300 mg by mouth at bedtime.   LACTULOSE (CHRONULAC) 10 GM/15ML SOLUTION    Take 10 g by mouth daily. constipation   LEVOTHYROXINE (SYNTHROID, LEVOTHROID) 25 MCG TABLET    Take 25 mcg by mouth daily before breakfast.   MAGNESIUM OXIDE (MAG-OX) 400 (241.3 MG) MG TABLET    Take 1 tablet (400 mg total) by mouth daily. For 14 days.   MELATONIN 3 MG TABS    Take 3 mg by mouth at bedtime.   METHOCARBAMOL (ROBAXIN) 500 MG TABLET    Take 500 mg by mouth every 8 (eight)  hours as needed for muscle spasms.   MULTIPLE VITAMINS-MINERALS (MULTIVITAMIN WITH MINERALS) TABLET    Take 1 tablet by mouth daily.   PANTOPRAZOLE (PROTONIX) 40 MG TABLET    Take 1 tablet (40 mg total) by mouth daily.   PATADAY 0.2 % SOLN    Place 1 drop into both eyes as needed. For allergies   PIPERACILLIN-TAZOBACTAM (ZOSYN) 3.375 (3-0.375) G INJECTION    Inject 3,375 mg (3.375 g total) into the muscle every 8 (eight) hours. Per pharmacy protocols. Duration per SNF providers.   POLYETHYLENE GLYCOL (MIRALAX / GLYCOLAX) PACKET    Take 17 g by mouth daily.   SACCHAROMYCES BOULARDII (FLORASTOR) 250 MG CAPSULE    Take 250 mg by mouth 2 (two) times daily.   SODIUM CHLORIDE (OCEAN) 0.65 % SOLN NASAL SPRAY    Place 1 spray into both nostrils as needed for congestion.   TIZANIDINE (ZANAFLEX) 4 MG TABLET    Take 4 mg by mouth every 8 (eight) hours as needed for muscle spasms.   TRAMADOL (ULTRAM) 50 MG TABLET    Take 4 tablets (200 mg total) by mouth every 12 (twelve) hours. For pain   VANCOMYCIN (VANCOCIN) 1 GM/200ML SOLN    Inject 400 mLs (2,000  mg total) into the vein every evening. Per pharmacy protocols. Duration per SNF providers depending on clinical progress.  Modified Medications   No medications on file  Discontinued Medications   No medications on file     SIGNIFICANT DIAGNOSTIC EXAMS  12-14-14 ct of head: No evidence of acute intracranial abnormality. Small vessel ischemic changes with intracranial atherosclerosis.  12-14-14: chest x-ray: Elevation left hemidiaphragm. No edema or consolidation. Mild cardiac enlargement.  12-15-14: ct of abdomen and pelvis: No evidence of abdominal, pelvic, or retroperitoneal hematoma. Prominent stool-filled rectosigmoid colon with gaseous distention of the remainder of the colon suggesting obstipation with pseudo-obstruction. Mass versus prominent vascularity in the subcarinal region.   12-15-14: renal ultrasound: Study is moderately limited by patient body habitus but appears grossly normal.   12-15-14: ct of chest: 1. While the mediastinal structures are difficult to evaluate without IV contrast, there does not appear to be a subcarinal mass and the findings on comparison CT likely represent the left atrium. 2. Left basilar atelectasis.  12-15-14: EEG: This EEG is abnormal with mild localized nonspecific continuous slowing of cerebral activity. No evidence of an epileptic disorder was demonstrated. Lack of epileptiform activity during EEG recording does not rule out seizure disorder, however  04-29-15: right lower extremity doppler: neg for dvt  06-01-15: No active cardiopulmonary disease.Right-sided PICC line with the tip projecting over the SVC       LABS REVIEWED:   07-12-14: wbc 6.0; hgb 9.8; hct 29.9 ;mcv 93.4; lt 285;  07-30-14: glucose 146; bun 29; creat 1.3; k+4.5 ;na++ 141 09-10-14: hgb a1c 6.4 10-25-14: hgb a1c 6.5 10-29-14: chol 146; ldl 89; trig 127; hdl 32; urine micro-albumin <2.0 11-03-14: wbc 9.5; hgb 8.1; hct 25.3; mcv 96.;3 plt 268 12-14-14: wbc 1.4; hgb 7.6; hgb  22.8; mcv 93.1 plt 356; glucose 79; bun 89; creat 4.15; k+4.4 ;na++133; liver normal albumin 3.5; tsh 4.786; vit b12: 1064; folate 15.5 12-14-14: (ED)wbc 9.1; hgb 7.5; hct 22.2; mcv 91.5; plt 339; glucose 95; bun 97; creat 4.74; k+4.5 na++133; liver normal albumin 3.4; tsh 3.898; ammonia 38 12-15-14: wbc 9.8; hgb 7.5; hct 23.0; mcv 90.6; plt 299; glucose 81; bun 95; creat 4.81; k+4.2; na++133; liver normal albumin 3.0; uric acid 53; LDH 124;  iron 38; tibc 154 PTH 93 12-16-14: urine culture no growth  12-17-14: wbc 7.0; hgb 8.9; hct 26.8; mcv 89.9; plt 309 12-19-14: glucose 109; bun 26; creat 1.71; k+4.3; na++139 12-23-14: glucose 102; bun 21; creat 1.53; k+4.7; na++138; tsh 4.567 01-21-15: wbc 8.0; hgb 9.6; hct 30.3; mcv 95.9; plt 296  03-11-15: tsh 3.818 04-01-15: glucose 98; bun 22; creat 1.70; k+4.8; na++138  05-02-15: hgb a1c 6.2 06-01-15: wbc 6.5 ;hgb 9.7 hct 30.0; mcv 94.3; plt 203; glucose 102; bun 15; creat 1.62; k+ 4.6 na++131; liver normal albumin 3.0; blood culture: no growth 06-02-15: hgb a1c 6.3; tsh 2.979; mad 1.6; phos 3.2; urine culture: no growth 06-03-15: wbc 5.0 hgb 9.5; hct 28.8; mcv 96.0; plt 192; glucose 130; bun 13; creat 1.55; k+4.3; na++134      Review of Systems Constitutional: Negative for appetite change and fatigue.  HENT: Negative for congestion.   Respiratory: Negative for cough, chest tightness and shortness of breath.   Cardiovascular: Negative for chest pain and palpitations.       Has less right lower extremity swelling present   Gastrointestinal: Negative for nausea, abdominal pain, diarrhea and constipation.  Musculoskeletal:       Has less right lower extremity pain   Skin: Negative for pallor.  Neurological: Negative for dizziness.  Psychiatric/Behavioral: The patient is not nervous/anxious.     Physical Exam Constitutional: She is oriented to person, place, and time. No distress.  Morbidly obese   Eyes: Conjunctivae are normal.  Neck: Neck supple. No JVD  present. No thyromegaly present.  Cardiovascular: Normal rate, regular rhythm and intact distal pulses.   Respiratory: Effort normal and breath sounds normal. No respiratory distress. She has no wheezes.  GI: Soft. Bowel sounds are normal. She exhibits no distension. There is no tenderness.  Musculoskeletal: She exhibits no edema.  Able to move all extremities  Has limited range of motion due to obesity   Lymphadenopathy:    She has no cervical adenopathy.  Neurological: She is alert and oriented to person, place, and time.  Skin: Skin is warm and dry. She is not diaphoretic.  Right leg with less redness; pain; swelling present.   Psychiatric: She has a normal mood and affect.     ASSESSMENT/ PLAN:  1. Gout: no recent flares will continue allopurinol 200 mg daily will monitor  2. DVT: she has completed her xarelto therapy; no further swelling present  will monitor   3. Chronic pain: her pain is being managed with her current regimen will continue ultram 200 mg twice daily; elavil 100 mg nightly; zanaflex 4 mg every 8 hours as needed robaxin 500 mg every 8 hours as needed;    4. Gerd: will continue protonix 40 mg daily  5. Constipation: colace twice daily; lactulose 10 gm daily miralax daily   6. Depression: she is emotionally stable; will continue prozac 40 mg daily and takes melatonin 3 mg nightly for sleep.   7. Diabetes: she is presently not taking medications since aug 2015; her last hgb a1c was 6.5; will check cbg twice daily   8. Hypertension:  Will begin lisinopril 2.5 mg daily; will have nursing check blood pressure twice daily and will check bmp in 2 weeks   9. Hypothyroidism: will continue  synthroid 25 mcg daily   tsh is 2.797  10. Right leg cellulitis: will complete 3 weeks of vancomycin therapy with pharmacy to dose and 3 weeks of zosyn 3.375gm every 8 hours. Will continue to monitor her  status.   11. Hypomagnesemia: will complete 14 days of mag ox 400 mg daily and  will recheck mag level.    Time spent with patient  50  minutes >50% time spent counseling; reviewing medical record; tests; labs; and developing future plan of care    Malak Duchesneau NP [facility]  Contact [phone] Monday through Friday 8am- 5pm  After hours call [phone]

## 2015-06-06 NOTE — Progress Notes (Signed)
Patient ID: Lauren Barber, female   DOB: 1955-12-26, 59 y.o.   MRN: [id number]  [name] living [place]      No Known Allergies     Chief Complaint  Patient presents with  . Annual Exam    HPI:  She is a long term resident of this facility being seen for her annual exam. Overall her status has remained stable.  She was hospitalized one time this past year. She will need a guaiac of her stools and will need a mammogram. There are no nursing concerns today.    Past Medical History  Diagnosis Date  . Hypertension   . Obesities, morbid   . Vaginal bleeding   . Endometriosis   . Atopic conjunctivitis   . Diabetes mellitus without complication   . Cellulitis     legs  . Insomnia   . Cardiomegaly   . Gout     feet  . Anemia   . Stroke 2013  . CVA (cerebral vascular accident)   . Anxiety   . Headache(784.0)     otc meds prn  . Degenerative, intervertebral disc     back, legs  . History of blood transfusion [place] - unsure number of units transfused  . Neuromuscular disorder     diabetic neuropathy - feet  . Depressive disorder     depressive disorder    Past Surgical History  Procedure Laterality Date  . Cesarean section  1982, 1989    x 2  . Biopsy endometrial N/A 10 03 2013  . Dilation and curettage of uterus  2013  . Tonsillectomy    . Wisdom tooth extraction    . Hysteroscopy w/d&c N/A 06/28/2014    Procedure: DILATATION AND CURETTAGE /HYSTEROSCOPY;  Surgeon: Lavonia Drafts, MD;  Location: [facility];  Service: Gynecology;  Laterality: N/A;    VITAL SIGNS BP 132/84 mmHg  Pulse 96  Ht 5\' 9"  (1.753 m)  Wt 346 lb (156.945 kg)  BMI 51.07 kg/m2  Patient's Medications  New Prescriptions   No medications on file  Previous Medications  . allopurinol (ZYLOPRIM) 100 MG tablet Take 200 mg by mouth daily.  Marland Kitchen amitriptyline (ELAVIL) 100 MG tablet Take 100 mg by mouth at bedtime.  . diphenhydrAMINE (BENADRYL) 25 MG tablet Take 25 mg by mouth  every 8 (eight) hours as needed for itching. For itching  . docusate sodium (COLACE) 100 MG capsule Take 1 capsule (100 mg total) by mouth 2 (two) times daily.  Marland Kitchen EPINEPHrine (EPIPEN) 0.3 mg/0.3 mL DEVI Inject 0.3 mg into the muscle once.  Marland Kitchen FLUoxetine (PROZAC) 20 MG capsule Take 2 capsules (40 mg total) by mouth daily.  Marland Kitchen lactulose (CHRONULAC) 10 GM/15ML solution Take 10 g by mouth 2 (two) times daily. constipation  . levothyroxine (SYNTHROID, LEVOTHROID) 25 MCG tablet Take 25 mcg by mouth daily before breakfast.  . Melatonin 3 MG TABS Take 3 mg by mouth at bedtime.  . methocarbamol (ROBAXIN) 500 MG tablet Take 500 mg by mouth every 8 (eight) hours as needed for muscle spasms.  . Multiple Vitamins-Minerals (MULTIVITAMIN WITH MINERALS) tablet Take 1 tablet by mouth daily.  . pantoprazole (PROTONIX) 40 MG tablet Take 1 tablet (40 mg total) by mouth daily.  . sodium chloride (OCEAN) 0.65 % SOLN nasal spray Place 1 spray into both nostrils as needed for congestion.  Marland Kitchen tiZANidine (ZANAFLEX) 4 MG tablet Take 4 mg by mouth every 8 (eight) hours as needed for muscle spasms.  Marland Kitchen  traMADol (ULTRAM) 50 MG tablet Take 4 tablets (200 mg total) by mouth every 12 (twelve) hours. For pain     Modified Medications   No medications on file  Discontinued Medications   No medications on file     SIGNIFICANT DIAGNOSTIC EXAMS   12-14-14 ct of head: No evidence of acute intracranial abnormality. Small vessel ischemic changes with intracranial atherosclerosis.  12-14-14: chest x-ray: Elevation left hemidiaphragm. No edema or consolidation. Mild cardiac enlargement.  12-15-14: ct of abdomen and pelvis: No evidence of abdominal, pelvic, or retroperitoneal hematoma. Prominent stool-filled rectosigmoid colon with gaseous distention of the remainder of the colon suggesting obstipation with pseudo-obstruction. Mass versus prominent vascularity in the subcarinal region.   12-15-14: renal ultrasound: Study is  moderately limited by patient body habitus but appears grossly normal.   12-15-14: ct of chest: 1. While the mediastinal structures are difficult to evaluate without IV contrast, there does not appear to be a subcarinal mass and the findings on comparison CT likely represent the left atrium. 2. Left basilar atelectasis.  12-15-14: EEG: This EEG is abnormal with mild localized nonspecific continuous slowing of cerebral activity. No evidence of an epileptic disorder was demonstrated. Lack of epileptiform activity during EEG recording does not rule out seizure disorder, however      LABS REVIEWED:   07-12-14: wbc 6.0; hgb 9.8; hct 29.9 ;mcv 93.4; lt 285;  07-30-14: glucose 146; bun 29; creat 1.3; k+4.5 ;na++ 141 09-10-14: hgb a1c 6.4 10-25-14: hgb a1c 6.5 10-29-14: chol 146; ldl 89; trig 127; hdl 32; urine micro-albumin <2.0 11-03-14: wbc 9.5; hgb 8.1; hct 25.3; mcv 96.;3 plt 268 12-14-14: wbc 1.4; hgb 7.6; hgb 22.8; mcv 93.1 plt 356; glucose 79; bun 89; creat 4.15; k+4.4 ;na++133; liver normal albumin 3.5; tsh 4.786; vit b12: 1064; folate 15.5 12-14-14: (ED)wbc 9.1; hgb 7.5; hct 22.2; mcv 91.5; plt 339; glucose 95; bun 97; creat 4.74; k+4.5 na++133; liver normal albumin 3.4; tsh 3.898; ammonia 38 12-15-14: wbc 9.8; hgb 7.5; hct 23.0; mcv 90.6; plt 299; glucose 81; bun 95; creat 4.81; k+4.2; na++133; liver normal albumin 3.0; uric acid 53; LDH 124; iron 38; tibc 154 PTH 93 12-16-14: urine culture no growth  12-17-14: wbc 7.0; hgb 8.9; hct 26.8; mcv 89.9; plt 309 12-19-14: glucose 109; bun 26; creat 1.71; k+4.3; na++139 12-23-14: glucose 102; bun 21; creat 1.53; k+4.7; na++138; tsh 4.567 01-21-15: wbc 8.0; hgb 9.6; hct 30.3; mcv 95.9; plt 296  03-11-15: tsh 3.818 04-01-15: glucose 98; bun 22; creat 1.70; k+4.8; na++138     Review of Systems  Constitutional: Negative for appetite change and fatigue.  HENT: Negative for congestion.   Respiratory: Negative for cough, chest tightness and shortness of  breath.   Cardiovascular: Negative for chest pain and palpitations.       Has right lower extremity swelling present   Gastrointestinal: Negative for nausea, abdominal pain, diarrhea and constipation.  Musculoskeletal:       Has right lower extremity pain   Skin: Negative for pallor.  Neurological: Negative for dizziness.  Psychiatric/Behavioral: The patient is not nervous/anxious.       Physical Exam  Constitutional: She is oriented to person, place, and time. No distress.  Morbidly obese   Eyes: Conjunctivae are normal.  Neck: Neck supple. No JVD present. No thyromegaly present.  Cardiovascular: Normal rate, regular rhythm and intact distal pulses.   Respiratory: Effort normal and breath sounds normal. No respiratory distress. She has no wheezes.  GI: Soft. Bowel sounds are normal.  She exhibits no distension. There is no tenderness.  Musculoskeletal: She exhibits no edema.  Able to move all extremities  Has limited range of motion due to obesity   Lymphadenopathy:    She has no cervical adenopathy.  Neurological: She is alert and oriented to person, place, and time.  Skin: Skin is warm and dry. She is not diaphoretic.  Right leg with swelling present is painful to touch Right thigh firm; red; warm; inflamed.   Psychiatric: She has a normal mood and affect.       ASSESSMENT/ PLAN:  1. Gout: no recent flares will continue allopurinol 200 mg daily will monitor  2. DVT: she has completed her xarelto therapy; no further swelling present  will monitor   3. Chronic pain: her pain is being managed with her current regimen will continue ultram 200 mg twice daily; elavil 100 mg nightly; zanaflex 4 mg every 8 hours as needed robaxin 500 mg every 8 hours as needed; will monitor   4. Gerd: will continue protonix 40 mg daily  5. Constipation: colace twice daily; lactulose 30 cc twice daily   6. Depression: she is emotionally stable; will continue prozac 40 mg daily and takes  melatonin 3 mg nightly for sleep.   7. Diabetes: she is presently not taking medications since aug 2015; her last hgb a1c was 6.5; will check cbg twice daily   8. Hypertension: is presently stable is not taking medications at this time; will not make changes will monitor her status.   9. Hypothyroidism: will continue  synthroid 25 mcg daily   tsh is 3.818  10. Right leg cellulitis: will being levaquin 500 mg daily for 3 weeks with florastor for 2 months. Will triamcinolone cream to abdominal folds until resolved. Will check right lower extremity venous doppler  Will setup mammogram; will guaiac stools X3; will check hgb a1c      Ok Edwards NP [facility]  Contact [phone] Monday through Friday 8am- 5pm  After hours call [phone]

## 2015-06-06 NOTE — Progress Notes (Signed)
Patient ID: Lauren Barber, female   DOB: 04/24/1956, 59 y.o.   MRN: [id number]  Lauren Barber living [place]      No Known Allergies     Chief Complaint  Patient presents with  . Medical Management of Chronic Issues    HPI:    Past Medical History  Diagnosis Date  . Hypertension   . Obesities, morbid   . Vaginal bleeding   . Endometriosis   . Atopic conjunctivitis   . Diabetes mellitus without complication   . Cellulitis     legs  . Insomnia   . Cardiomegaly   . Gout     feet  . Anemia   . Stroke 2013  . CVA (cerebral vascular accident)   . Anxiety   . Headache(784.0)     otc meds prn  . Degenerative, intervertebral disc     back, legs  . History of blood transfusion [place] - unsure number of units transfused  . Neuromuscular disorder     diabetic neuropathy - feet  . Depressive disorder     depressive disorder    Past Surgical History  Procedure Laterality Date  . Cesarean section  1982, 1989    x 2  . Biopsy endometrial N/A 10 03 2013  . Dilation and curettage of uterus  2013  . Tonsillectomy    . Wisdom tooth extraction    . Hysteroscopy w/d&c N/A 06/28/2014    Procedure: DILATATION AND CURETTAGE /HYSTEROSCOPY;  Surgeon: Lavonia Drafts, MD;  Location: [facility];  Service: Gynecology;  Laterality: N/A;    VITAL SIGNS BP 122/60 mmHg  Pulse 90  Ht 5\' 9"  (1.753 m)  Wt 346 lb (156.945 kg)  BMI 51.07 kg/m2  Patient's Medications  New Prescriptions   No medications on file  Previous Medications   . allopurinol (ZYLOPRIM) 100 MG tablet Take 200 mg by mouth daily.  Marland Kitchen amitriptyline (ELAVIL) 100 MG tablet Take 100 mg by mouth at bedtime.  . diphenhydrAMINE (BENADRYL) 25 MG tablet Take 25 mg by mouth every 8 (eight) hours as needed for itching. For itching  . docusate sodium (COLACE) 100 MG capsule Take 1 capsule (100 mg total) by mouth 2 (two) times daily.  Marland Kitchen EPINEPHrine (EPIPEN) 0.3 mg/0.3 mL DEVI Inject 0.3 mg into the muscle once.    Marland Kitchen FLUoxetine (PROZAC) 20 MG capsule Take 2 capsules (40 mg total) by mouth daily.  Marland Kitchen lactulose (CHRONULAC) 10 GM/15ML solution Take 10 g by mouth 2 (two) times daily. constipation  . levothyroxine (SYNTHROID, LEVOTHROID) 25 MCG tablet Take 25 mcg by mouth daily before breakfast.  . Melatonin 3 MG TABS Take 3 mg by mouth at bedtime.  . methocarbamol (ROBAXIN) 500 MG tablet Take 500 mg by mouth every 8 (eight) hours as needed for muscle spasms.  . Multiple Vitamins-Minerals (MULTIVITAMIN WITH MINERALS) tablet Take 1 tablet by mouth daily.  . pantoprazole (PROTONIX) 40 MG tablet Take 1 tablet (40 mg total) by mouth daily.  . sodium chloride (OCEAN) 0.65 % SOLN nasal spray Place 1 spray into both nostrils as needed for congestion.  Marland Kitchen tiZANidine (ZANAFLEX) 4 MG tablet Take 4 mg by mouth every 8 (eight) hours as needed for muscle spasms.  . traMADol (ULTRAM) 50 MG tablet Take 4 tablets (200 mg total) by mouth every 12 (twelve) hours. For pain        Modified Medications   No medications on file  Discontinued Medications   No medications on file  SIGNIFICANT DIAGNOSTIC EXAMS  12-14-14 ct of head: No evidence of acute intracranial abnormality. Small vessel ischemic changes with intracranial atherosclerosis.  12-14-14: chest x-ray: Elevation left hemidiaphragm. No edema or consolidation. Mild cardiac enlargement.  12-15-14: ct of abdomen and pelvis: No evidence of abdominal, pelvic, or retroperitoneal hematoma. Prominent stool-filled rectosigmoid colon with gaseous distention of the remainder of the colon suggesting obstipation with pseudo-obstruction. Mass versus prominent vascularity in the subcarinal region.   12-15-14: renal ultrasound: Study is moderately limited by patient body habitus but appears grossly normal.   12-15-14: ct of chest: 1. While the mediastinal structures are difficult to evaluate without IV contrast, there does not appear to be a subcarinal mass and the findings on  comparison CT likely represent the left atrium. 2. Left basilar atelectasis.  12-15-14: EEG: This EEG is abnormal with mild localized nonspecific continuous slowing of cerebral activity. No evidence of an epileptic disorder was demonstrated. Lack of epileptiform activity during EEG recording does not rule out seizure disorder, however  04-29-15: right lower extremity doppler: neg for dvt      LABS REVIEWED:   07-12-14: wbc 6.0; hgb 9.8; hct 29.9 ;mcv 93.4; lt 285;  07-30-14: glucose 146; bun 29; creat 1.3; k+4.5 ;na++ 141 09-10-14: hgb a1c 6.4 10-25-14: hgb a1c 6.5 10-29-14: chol 146; ldl 89; trig 127; hdl 32; urine micro-albumin <2.0 11-03-14: wbc 9.5; hgb 8.1; hct 25.3; mcv 96.;3 plt 268 12-14-14: wbc 1.4; hgb 7.6; hgb 22.8; mcv 93.1 plt 356; glucose 79; bun 89; creat 4.15; k+4.4 ;na++133; liver normal albumin 3.5; tsh 4.786; vit b12: 1064; folate 15.5 12-14-14: (ED)wbc 9.1; hgb 7.5; hct 22.2; mcv 91.5; plt 339; glucose 95; bun 97; creat 4.74; k+4.5 na++133; liver normal albumin 3.4; tsh 3.898; ammonia 38 12-15-14: wbc 9.8; hgb 7.5; hct 23.0; mcv 90.6; plt 299; glucose 81; bun 95; creat 4.81; k+4.2; na++133; liver normal albumin 3.0; uric acid 53; LDH 124; iron 38; tibc 154 PTH 93 12-16-14: urine culture no growth  12-17-14: wbc 7.0; hgb 8.9; hct 26.8; mcv 89.9; plt 309 12-19-14: glucose 109; bun 26; creat 1.71; k+4.3; na++139 12-23-14: glucose 102; bun 21; creat 1.53; k+4.7; na++138; tsh 4.567 01-21-15: wbc 8.0; hgb 9.6; hct 30.3; mcv 95.9; plt 296  03-11-15: tsh 3.818 04-01-15: glucose 98; bun 22; creat 1.70; k+4.8; na++138  05-02-15: hgb a1c 6.2       Review of Systems Constitutional: Negative for appetite change and fatigue.  HENT: Negative for congestion.   Respiratory: Negative for cough, chest tightness and shortness of breath.   Cardiovascular: Negative for chest pain and palpitations.       Has right lower extremity swelling present   Gastrointestinal: Negative for nausea, abdominal  pain, diarrhea and constipation.  Musculoskeletal:       Has right lower extremity pain   Skin: Negative for pallor.  Neurological: Negative for dizziness.  Psychiatric/Behavioral: The patient is not nervous/anxious.      Physical Exam Constitutional: She is oriented to person, place, and time. No distress.  Morbidly obese   Eyes: Conjunctivae are normal.  Neck: Neck supple. No JVD present. No thyromegaly present.  Cardiovascular: Normal rate, regular rhythm and intact distal pulses.   Respiratory: Effort normal and breath sounds normal. No respiratory distress. She has no wheezes.  GI: Soft. Bowel sounds are normal. She exhibits no distension. There is no tenderness.  Musculoskeletal: She exhibits no edema.  Able to move all extremities  Has limited range of motion due to obesity   Lymphadenopathy:  She has no cervical adenopathy.  Neurological: She is alert and oriented to person, place, and time.  Skin: Skin is warm and dry. She is not diaphoretic.  Right leg with swelling present is painful to touch Right thigh firm; red; warm; inflamed.   Psychiatric: She has a normal mood and affect.       ASSESSMENT/ PLAN:  1. Gout: no recent flares will continue allopurinol 200 mg daily will monitor  2. DVT: she has completed her xarelto therapy; no further swelling present  will monitor   3. Chronic pain: her pain is being managed with her current regimen will continue ultram 200 mg twice daily; elavil 100 mg nightly; zanaflex 4 mg every 8 hours as needed robaxin 500 mg every 8 hours as needed;   Will begin neurontin 300 mg nightly for peripheral neuropathy   4. Gerd: will continue protonix 40 mg daily  5. Constipation: colace twice daily; lactulose 30 cc twice daily   6. Depression: she is emotionally stable; will continue prozac 40 mg daily and takes melatonin 3 mg nightly for sleep.   7. Diabetes: she is presently not taking medications since aug 2015; her last hgb a1c was  6.5; will check cbg twice daily   8. Hypertension: is presently stable is not taking medications at this time; will not make changes will monitor her status.   9. Hypothyroidism: will continue  synthroid 25 mcg daily   tsh is 3.818  10. Right leg cellulitis: took cipro in June for her cellulitis; will insert picc line; will begin vancomycin 2000 mg iv daily with pharmacy to dose for 3 weeks; will being zoysn 3.375 gm every 8 hours for 3 weeks. Will monitor her status.      Ok Edwards NP [facility]  Contact [phone] Monday through Friday 8am- 5pm  After hours call [phone]

## 2015-06-07 ENCOUNTER — Non-Acute Institutional Stay (SKILLED_NURSING_FACILITY): Payer: Medicare Other | Admitting: Internal Medicine

## 2015-06-07 ENCOUNTER — Encounter: Payer: Self-pay | Admitting: Internal Medicine

## 2015-06-07 DIAGNOSIS — N189 Chronic kidney disease, unspecified: Secondary | ICD-10-CM

## 2015-06-07 DIAGNOSIS — E1143 Type 2 diabetes mellitus with diabetic autonomic (poly)neuropathy: Secondary | ICD-10-CM

## 2015-06-07 DIAGNOSIS — L03115 Cellulitis of right lower limb: Secondary | ICD-10-CM | POA: Diagnosis not present

## 2015-06-07 DIAGNOSIS — L270 Generalized skin eruption due to drugs and medicaments taken internally: Secondary | ICD-10-CM

## 2015-06-07 DIAGNOSIS — G8929 Other chronic pain: Secondary | ICD-10-CM

## 2015-06-07 DIAGNOSIS — I10 Essential (primary) hypertension: Secondary | ICD-10-CM | POA: Diagnosis not present

## 2015-06-07 DIAGNOSIS — F329 Major depressive disorder, single episode, unspecified: Secondary | ICD-10-CM | POA: Diagnosis not present

## 2015-06-07 DIAGNOSIS — F32A Depression, unspecified: Secondary | ICD-10-CM

## 2015-06-07 NOTE — Progress Notes (Signed)
Patient ID: Lauren Barber, female   DOB: 11-09-1956, 59 y.o.   MRN: [id number]    HISTORY AND PHYSICAL   DATE: 06/07/15  Location:  [facility]    Place of Service: SNF (31)   Extended Emergency Contact Information Primary Emergency Contact: Hill,[name] Address: [address] [place], [place] [address] [place] Phone: [phone] Relation: Mother  Advanced Directive information  FULL CODE  Chief Complaint  Patient presents with  . Readmit To SNF    HPI:  59 yo female long term resident  seen today as a readmission into SNF following hospital stay for RLE cellulitis and fever. She has hx DM II, depression, CKD and HTN. She was dx with sepsis and RLE cellulitis and tx with IV zosyn and vanco. LE doppler study was inconclusive. Her pain today has improved. She has developed an intermittently pruritic red rash that is spreading. It feels warm to touch. No blisters. Denies CP/SOB/palpitations. No HA or dizziness. No f/c. RLE feels much better  She has diet controlled DM. CBGs stable. Reading 153 on yesterday. She has neuropathy and takes gabapentin and amitriptyline  BP borderline controlled on furosemide and lisinopril  Mood stable on prozac  Thyroid stable on levothyroxine  She has chronic pain and is stable on tramadol, tylenol prn, prn zanaflex and robaxin  GERD sx's stable on protonix. She takes colace, lactulose and miralax for constipation  No gout attacks on allopurinol  Past Medical History  Diagnosis Date  . Hypertension   . Obesities, morbid   . Vaginal bleeding   . Endometriosis   . Atopic conjunctivitis   . Diabetes mellitus without complication   . Cellulitis     legs  . Insomnia   . Cardiomegaly   . Gout     feet  . Anemia   . Stroke 2013  . CVA (cerebral vascular accident)   . Anxiety   . Headache(784.0)     otc meds prn  . Degenerative, intervertebral disc     back, legs  . History of  blood transfusion [place] - unsure number of units transfused  . Neuromuscular disorder     diabetic neuropathy - feet  . Depressive disorder     depressive disorder    Past Surgical History  Procedure Laterality Date  . Cesarean section  1982, 1989    x 2  . Biopsy endometrial N/A 10 03 2013  . Dilation and curettage of uterus  2013  . Tonsillectomy    . Wisdom tooth extraction    . Hysteroscopy w/d&c N/A 06/28/2014    Procedure: DILATATION AND CURETTAGE /HYSTEROSCOPY;  Surgeon: Lavonia Drafts, MD;  Location: [facility];  Service: Gynecology;  Laterality: N/A;    Patient Care Team: Gildardo Cranker, DO as PCP - General (Internal Medicine) Gerlene Fee, NP as Nurse Practitioner (Nurse Practitioner)  History   Social History  . Marital Status: Single    Spouse Name: N/A  . Number of Children: N/A  . Years of Education: N/A   Occupational History  . Not on file.   Social History Main Topics  . Smoking status: Former Smoker -- 0.50 packs/day for 43 years    Types: Cigarettes    Quit date: 06/11/2012  . Smokeless tobacco: Never Used  . Alcohol Use: No  . Drug Use: No  . Sexual Activity: No   Other Topics Concern  .  Not on file   Social History Narrative     reports that she quit smoking about 2 years ago. Her smoking use included Cigarettes. She has a 21.5 pack-year smoking history. She has never used smokeless tobacco. She reports that she does not drink alcohol or use illicit drugs.  Family History  Problem Relation Age of Onset  . Hypertension Mother   . Hypertension Father    No family status information on file.    Immunization History  Administered Date(s) Administered  . Influenza Whole 09/09/2013  . Influenza-Unspecified 09/01/2014  . Pneumococcal Polysaccharide-23 12/16/2014  . Td 07/22/2006    No Known Allergies  Medications: Patient's Medications  New Prescriptions   No medications on file  Previous Medications    ACETAMINOPHEN (TYLENOL) 325 MG TABLET    Take 650 mg by mouth every 6 (six) hours as needed for moderate pain, fever or headache.    ALLOPURINOL (ZYLOPRIM) 100 MG TABLET    Take 200 mg by mouth daily.   AMITRIPTYLINE (ELAVIL) 100 MG TABLET    Take 100 mg by mouth at bedtime.   DIPHENHYDRAMINE (BENADRYL) 25 MG TABLET    Take 25 mg by mouth every 8 (eight) hours as needed for itching. For itching   DOCUSATE SODIUM (COLACE) 100 MG CAPSULE    Take 1 capsule (100 mg total) by mouth 2 (two) times daily.   EPINEPHRINE (EPIPEN) 0.3 MG/0.3 ML DEVI    Inject 0.3 mg into the muscle once.   FLUOXETINE (PROZAC) 20 MG CAPSULE    Take 2 capsules (40 mg total) by mouth daily.   FUROSEMIDE (LASIX) 20 MG TABLET    Take 20 mg by mouth daily.   GABAPENTIN (NEURONTIN) 300 MG CAPSULE    Take 300 mg by mouth at bedtime.   LACTULOSE (CHRONULAC) 10 GM/15ML SOLUTION    Take 10 g by mouth daily. constipation   LEVOTHYROXINE (SYNTHROID, LEVOTHROID) 25 MCG TABLET    Take 25 mcg by mouth daily before breakfast.   MAGNESIUM OXIDE (MAG-OX) 400 (241.3 MG) MG TABLET    Take 1 tablet (400 mg total) by mouth daily. For 14 days.   MELATONIN 3 MG TABS    Take 3 mg by mouth at bedtime.   METHOCARBAMOL (ROBAXIN) 500 MG TABLET    Take 500 mg by mouth every 8 (eight) hours as needed for muscle spasms.   MULTIPLE VITAMINS-MINERALS (MULTIVITAMIN WITH MINERALS) TABLET    Take 1 tablet by mouth daily.   PANTOPRAZOLE (PROTONIX) 40 MG TABLET    Take 1 tablet (40 mg total) by mouth daily.   PATADAY 0.2 % SOLN    Place 1 drop into both eyes as needed. For allergies   PIPERACILLIN-TAZOBACTAM (ZOSYN) 3.375 (3-0.375) G INJECTION    Inject 3,375 mg (3.375 g total) into the muscle every 8 (eight) hours. Per pharmacy protocols. Duration per SNF providers.   POLYETHYLENE GLYCOL (MIRALAX / GLYCOLAX) PACKET    Take 17 g by mouth daily.   SACCHAROMYCES BOULARDII (FLORASTOR) 250 MG CAPSULE    Take 250 mg by mouth 2 (two) times daily.   SODIUM CHLORIDE  (OCEAN) 0.65 % SOLN NASAL SPRAY    Place 1 spray into both nostrils as needed for congestion.   TIZANIDINE (ZANAFLEX) 4 MG TABLET    Take 4 mg by mouth every 8 (eight) hours as needed for muscle spasms.   TRAMADOL (ULTRAM) 50 MG TABLET    Take 4 tablets (200 mg total) by mouth every 12 (twelve)  hours. For pain   VANCOMYCIN (VANCOCIN) 1 GM/200ML SOLN    Inject 400 mLs (2,000 mg total) into the vein every evening. Per pharmacy protocols. Duration per SNF providers depending on clinical progress.  Modified Medications   No medications on file  Discontinued Medications   No medications on file    Review of Systems  Unable to perform ROS: Psychiatric disorder    Filed Vitals:   06/07/15 1301  BP: 150/90  Pulse: 99  Temp: 99.5 F (37.5 C)  Weight: 362 lb (164.202 kg)  SpO2: 96%   Body mass index is 53.43 kg/(m^2).  Physical Exam  Constitutional: She appears well-developed and well-nourished.  Lying in bed. Morbidly obese  HENT:  Mouth/Throat: Oropharynx is clear and moist. No oropharyngeal exudate.  Eyes: Pupils are equal, round, and reactive to light. No scleral icterus.  Neck: Neck supple. Carotid bruit is not present. No tracheal deviation present. No thyromegaly present.  Cardiovascular: Regular rhythm, normal heart sounds and intact distal pulses.  Tachycardia present.  Exam reveals no gallop and no friction rub.   No murmur heard. R>L LE edema. no calf TTP.  Right arm PICC line intact and signs of secondary infection at insertion site. TED stockings intact b/l.  Pulmonary/Chest: Effort normal and breath sounds normal. No stridor. No respiratory distress. She has no wheezes. She has no rales.  Abdominal: Soft. Bowel sounds are normal. She exhibits no distension and no mass. There is no hepatomegaly. There is no tenderness. There is no rebound and no guarding.  obese  Musculoskeletal: She exhibits edema and tenderness.  Lymphadenopathy:    She has no cervical adenopathy.    Neurological: She is alert.  Skin: Skin is warm, dry and intact. Rash (generalized blancheable red rash with L>R hand swelling and increased warmth to touch. NT. no vesicles.) noted.  Psychiatric: Her speech is normal and behavior is normal. Thought content normal. Her mood appears anxious.  Flat affect     Labs reviewed: Admission on 06/01/2015, Discharged on 06/03/2015  Component Date Value Ref Range Status  . Sodium 06/01/2015 131* 135 - 145 mmol/L Final  . Potassium 06/01/2015 4.6  3.5 - 5.1 mmol/L Final  . Chloride 06/01/2015 99* 101 - 111 mmol/L Final  . CO2 06/01/2015 25  22 - 32 mmol/L Final  . Glucose, Bld 06/01/2015 102* 65 - 99 mg/dL Final  . BUN 06/01/2015 15  6 - 20 mg/dL Final  . Creatinine, Ser 06/01/2015 1.62* 0.44 - 1.00 mg/dL Final  . Calcium 06/01/2015 8.8* 8.9 - 10.3 mg/dL Final  . Total Protein 06/01/2015 7.8  6.5 - 8.1 g/dL Final  . Albumin 06/01/2015 3.0* 3.5 - 5.0 g/dL Final  . AST 06/01/2015 22  15 - 41 U/L Final  . ALT 06/01/2015 19  14 - 54 U/L Final  . Alkaline Phosphatase 06/01/2015 97  38 - 126 U/L Final  . Total Bilirubin 06/01/2015 0.5  0.3 - 1.2 mg/dL Final  . GFR calc non Af Amer 06/01/2015 34* >60 mL/min Final  . GFR calc Af Amer 06/01/2015 40* >60 mL/min Final   Comment: (NOTE) The eGFR has been calculated using the CKD EPI equation. This calculation has not been validated in all clinical situations. eGFR's persistently <60 mL/min signify possible Chronic Kidney Disease.   . Anion gap 06/01/2015 7  5 - 15 Final  . Specimen Description 06/01/2015 BLOOD PICC LINE   Final  . Special Requests 06/01/2015 BOTTLES DRAWN AEROBIC AND ANAEROBIC 8CC   Final  .  Culture 06/01/2015 NO GROWTH 5 DAYS   Final  . Report Status 06/01/2015 06/06/2015 FINAL   Final  . Specimen Description 06/01/2015 BLOOD LEFT HAND   Final  . Special Requests 06/01/2015 BOTTLES DRAWN AEROBIC AND ANAEROBIC 5CC   Final  . Culture 06/01/2015 NO GROWTH 5 DAYS   Final  . Report  Status 06/01/2015 06/06/2015 FINAL   Final  . Specimen Description 06/02/2015 URINE, CLEAN CATCH   Final  . Special Requests 06/02/2015 NONE   Final  . Culture 06/02/2015 3,000 COLONIES/mL INSIGNIFICANT GROWTH   Final  . Report Status 06/02/2015 06/04/2015 FINAL   Final  . Lactic Acid, Venous 06/01/2015 0.92  0.5 - 2.0 mmol/L Final  . WBC 06/01/2015 6.5  4.0 - 10.5 K/uL Final  . RBC 06/01/2015 3.18* 3.87 - 5.11 MIL/uL Final  . Hemoglobin 06/01/2015 9.7* 12.0 - 15.0 g/dL Final  . HCT 06/01/2015 30.0* 36.0 - 46.0 % Final  . MCV 06/01/2015 94.3  78.0 - 100.0 fL Final  . MCH 06/01/2015 30.5  26.0 - 34.0 pg Final  . MCHC 06/01/2015 32.3  30.0 - 36.0 g/dL Final  . RDW 06/01/2015 14.2  11.5 - 15.5 % Final  . Platelets 06/01/2015 203  150 - 400 K/uL Final  . Neutrophils Relative % 06/01/2015 81* 43 - 77 % Final  . Neutro Abs 06/01/2015 5.3  1.7 - 7.7 K/uL Final  . Lymphocytes Relative 06/01/2015 14  12 - 46 % Final  . Lymphs Abs 06/01/2015 0.9  0.7 - 4.0 K/uL Final  . Monocytes Relative 06/01/2015 3  3 - 12 % Final  . Monocytes Absolute 06/01/2015 0.2  0.1 - 1.0 K/uL Final  . Eosinophils Relative 06/01/2015 2  0 - 5 % Final  . Eosinophils Absolute 06/01/2015 0.2  0.0 - 0.7 K/uL Final  . Basophils Relative 06/01/2015 0  0 - 1 % Final  . Basophils Absolute 06/01/2015 0.0  0.0 - 0.1 K/uL Final  . Glucose-Capillary 06/01/2015 97  65 - 99 mg/dL Final  . Lactic Acid, Venous 06/02/2015 0.87  0.5 - 2.0 mmol/L Final  . Hgb A1c MFr Bld 06/02/2015 6.3* 4.8 - 5.6 % Final   Comment: (NOTE)         Pre-diabetes: 5.7 - 6.4         Diabetes: >6.4         Glycemic control for adults with diabetes: <7.0   . Mean Plasma Glucose 06/02/2015 134   Final   Comment: (NOTE) Performed At: [facility] [place], [place] [id number] Lindon Romp MD [id number]   . Magnesium 06/02/2015 1.6* 1.7 - 2.4 mg/dL Final  . Phosphorus 06/02/2015 3.2  2.5 - 4.6 mg/dL Final  . TSH 06/02/2015  2.979  0.350 - 4.500 uIU/mL Final  . Sodium 06/02/2015 132* 135 - 145 mmol/L Final  . Potassium 06/02/2015 4.1  3.5 - 5.1 mmol/L Final  . Chloride 06/02/2015 101  101 - 111 mmol/L Final  . CO2 06/02/2015 23  22 - 32 mmol/L Final  . Glucose, Bld 06/02/2015 130* 65 - 99 mg/dL Final  . BUN 06/02/2015 15  6 - 20 mg/dL Final  . Creatinine, Ser 06/02/2015 1.62* 0.44 - 1.00 mg/dL Final  . Calcium 06/02/2015 8.5* 8.9 - 10.3 mg/dL Final  . Total Protein 06/02/2015 7.1  6.5 - 8.1 g/dL Final  . Albumin 06/02/2015 2.8* 3.5 - 5.0 g/dL Final  . AST 06/02/2015 23  15 - 41 U/L Final  .  ALT 06/02/2015 19  14 - 54 U/L Final  . Alkaline Phosphatase 06/02/2015 99  38 - 126 U/L Final  . Total Bilirubin 06/02/2015 0.5  0.3 - 1.2 mg/dL Final  . GFR calc non Af Amer 06/02/2015 34* >60 mL/min Final  . GFR calc Af Amer 06/02/2015 40* >60 mL/min Final   Comment: (NOTE) The eGFR has been calculated using the CKD EPI equation. This calculation has not been validated in all clinical situations. eGFR's persistently <60 mL/min signify possible Chronic Kidney Disease.   . Anion gap 06/02/2015 8  5 - 15 Final  . WBC 06/02/2015 5.3  4.0 - 10.5 K/uL Final  . RBC 06/02/2015 3.21* 3.87 - 5.11 MIL/uL Final  . Hemoglobin 06/02/2015 9.8* 12.0 - 15.0 g/dL Final  . HCT 06/02/2015 30.4* 36.0 - 46.0 % Final  . MCV 06/02/2015 94.7  78.0 - 100.0 fL Final  . MCH 06/02/2015 30.5  26.0 - 34.0 pg Final  . MCHC 06/02/2015 32.2  30.0 - 36.0 g/dL Final  . RDW 06/02/2015 14.3  11.5 - 15.5 % Final  . Platelets 06/02/2015 189  150 - 400 K/uL Final  . Osmolality, Ur 06/02/2015 473  390 - 1090 mOsm/kg Final   Performed at [facility]  . MRSA by PCR 06/02/2015 POSITIVE* NEGATIVE Final   Comment:        The GeneXpert MRSA Assay (FDA approved for NASAL specimens only), is one component of a comprehensive MRSA colonization surveillance program. It is not intended to diagnose MRSA infection nor to guide or monitor treatment  for MRSA infections. RESULT CALLED TO, READ BACK BY AND VERIFIED WITH: CARSON,J RN [phone] AT 0503 SKEEN,P   . Glucose-Capillary 06/02/2015 133* 65 - 99 mg/dL Final  . Glucose-Capillary 06/02/2015 127* 65 - 99 mg/dL Final  . WBC 06/03/2015 5.0  4.0 - 10.5 K/uL Final  . RBC 06/03/2015 3.00* 3.87 - 5.11 MIL/uL Final  . Hemoglobin 06/03/2015 9.5* 12.0 - 15.0 g/dL Final  . HCT 06/03/2015 28.8* 36.0 - 46.0 % Final  . MCV 06/03/2015 96.0  78.0 - 100.0 fL Final  . MCH 06/03/2015 31.7  26.0 - 34.0 pg Final  . MCHC 06/03/2015 33.0  30.0 - 36.0 g/dL Final  . RDW 06/03/2015 14.4  11.5 - 15.5 % Final  . Platelets 06/03/2015 192  150 - 400 K/uL Final  . Sodium 06/03/2015 134* 135 - 145 mmol/L Final  . Potassium 06/03/2015 4.3  3.5 - 5.1 mmol/L Final  . Chloride 06/03/2015 101  101 - 111 mmol/L Final  . CO2 06/03/2015 24  22 - 32 mmol/L Final  . Glucose, Bld 06/03/2015 130* 65 - 99 mg/dL Final  . BUN 06/03/2015 13  6 - 20 mg/dL Final  . Creatinine, Ser 06/03/2015 1.55* 0.44 - 1.00 mg/dL Final  . Calcium 06/03/2015 8.7* 8.9 - 10.3 mg/dL Final  . GFR calc non Af Amer 06/03/2015 36* >60 mL/min Final  . GFR calc Af Amer 06/03/2015 42* >60 mL/min Final   Comment: (NOTE) The eGFR has been calculated using the CKD EPI equation. This calculation has not been validated in all clinical situations. eGFR's persistently <60 mL/min signify possible Chronic Kidney Disease.   . Anion gap 06/03/2015 9  5 - 15 Final  . Glucose-Capillary 06/02/2015 101* 65 - 99 mg/dL Final  . Glucose-Capillary 06/02/2015 116* 65 - 99 mg/dL Final  . Comment 1 06/02/2015 Notify RN   Final  . Glucose-Capillary 06/03/2015 115* 65 - 99 mg/dL Final  .  Glucose-Capillary 06/03/2015 119* 65 - 99 mg/dL Final    Dg Chest 2 View  06/01/2015   CLINICAL DATA:  Possible sepsis and cellulitis of the right leg.  EXAM: CHEST  2 VIEW  COMPARISON:  CT chest 12/15/2014  FINDINGS: Right-sided PICC line with the tip projecting over the SVC. There  is no focal parenchymal opacity. There is no pleural effusion or pneumothorax. The heart and mediastinal contours are unremarkable.  The osseous structures are unremarkable.  IMPRESSION: No active cardiopulmonary disease.  Right-sided PICC line with the tip projecting over the SVC.   Electronically Signed   By: Kathreen Devoid   On: 06/01/2015 22:22   Ir Fluoro Guide Cv Line Right  05/31/2015   CLINICAL DATA:  59 year old female with cellulitis in need of venous access for outpatient IV antibiotic therapy  EXAM: IR RIGHT FLOURO GUIDE CV LINE; IR ULTRASOUND GUIDANCE VASC ACCESS RIGHT  FLUOROSCOPY TIME:  6 seconds  5.5 mGy  TECHNIQUE: The right arm was prepped with chlorhexidine, draped in the usual sterile fashion using maximum barrier technique (cap and mask, sterile gown, sterile gloves, large sterile sheet, hand hygiene and cutaneous antiseptic). Local anesthesia was attained by infiltration with 1% lidocaine.  Ultrasound demonstrated patency of the right brachia vein, and this was documented with an image. Under real-time ultrasound guidance, this vein was accessed with a 21 gauge micropuncture needle and image documentation was performed. The needle was exchanged over a guidewire for a peel-away sheath through which a 40 cm 5 [place] single lumen power injectable PICC was advanced, and positioned with its tip at the lower SVC/right atrial junction. Fluoroscopy during the procedure and fluoro spot radiograph confirms appropriate catheter position. The catheter was flushed, secured to the skin with Prolene sutures, and covered with a sterile dressing.  COMPLICATIONS: None.  The patient tolerated the procedure well.  IMPRESSION: Successful placement of a right arm PowerPICC with sonographic and fluoroscopic guidance. The catheter is ready for use.   Electronically Signed   By: Jacqulynn Cadet M.D.   On: 05/31/2015 19:02   Ir US Guide Vasc Access Right  05/31/2015   CLINICAL DATA:  59 year old female with  cellulitis in need of venous access for outpatient IV antibiotic therapy  EXAM: IR RIGHT FLOURO GUIDE CV LINE; IR ULTRASOUND GUIDANCE VASC ACCESS RIGHT  FLUOROSCOPY TIME:  6 seconds  5.5 mGy  TECHNIQUE: The right arm was prepped with chlorhexidine, draped in the usual sterile fashion using maximum barrier technique (cap and mask, sterile gown, sterile gloves, large sterile sheet, hand hygiene and cutaneous antiseptic). Local anesthesia was attained by infiltration with 1% lidocaine.  Ultrasound demonstrated patency of the right brachia vein, and this was documented with an image. Under real-time ultrasound guidance, this vein was accessed with a 21 gauge micropuncture needle and image documentation was performed. The needle was exchanged over a guidewire for a peel-away sheath through which a 40 cm 5 [place] single lumen power injectable PICC was advanced, and positioned with its tip at the lower SVC/right atrial junction. Fluoroscopy during the procedure and fluoro spot radiograph confirms appropriate catheter position. The catheter was flushed, secured to the skin with Prolene sutures, and covered with a sterile dressing.  COMPLICATIONS: None.  The patient tolerated the procedure well.  IMPRESSION: Successful placement of a right arm PowerPICC with sonographic and fluoroscopic guidance. The catheter is ready for use.   Electronically Signed   By: Jacqulynn Cadet M.D.   On: 05/31/2015 19:02  Assessment/Plan   ICD-9-CM ICD-10-CM   1. Drug-induced skin rash probably related to Vanco 693.0 L27.0   2. Cellulitis of right lower extremity - resolved 682.6 L03.115   3. Diabetic autonomic neuropathy associated with type 2 diabetes mellitus - stable 250.60 E11.43    337.1    4. Essential hypertension - borderline controlled 401.9 I10   5. Depression - stable 311 F32.9   6. Chronic pain - stable 338.29 G89.29   7. Chronic kidney disease (CKD), unspecified stage - stable 585.9 N18.9     --d/c IV Vanco and  zosyn. She completed 7 days of tx. Will maintain PICC line for now and re-eval to determine if can d/c in 1 week if she had no exac of cellulitis  --benadryl 50 mg po x 1  --zantac 138m po x 1  --zyrtec 136mpo daily x 2 weeks  --check CBC w diff and CMP on 06/10/15  --cont other meds as ordered  --PT/OT/ST as indicated  --will follow  Mylo Driskill S. CaPerlie GoldPiTowson Surgical [facility] LLCnd [facility] [address] [id number] (Monday-Friday 8 AM - 5 PM) ([phone] 5 PM and follow prompts

## 2015-06-13 ENCOUNTER — Other Ambulatory Visit: Payer: Self-pay | Admitting: Internal Medicine

## 2015-06-13 DIAGNOSIS — N644 Mastodynia: Secondary | ICD-10-CM

## 2015-06-16 ENCOUNTER — Ambulatory Visit
Admission: RE | Admit: 2015-06-16 | Discharge: 2015-06-16 | Disposition: A | Discharge status: Home / Self Care | Payer: Medicare Other | Source: Ambulatory Visit | Attending: Internal Medicine | Admitting: Internal Medicine

## 2015-06-16 ENCOUNTER — Other Ambulatory Visit: Payer: Self-pay | Admitting: Internal Medicine

## 2015-06-16 DIAGNOSIS — N644 Mastodynia: Secondary | ICD-10-CM

## 2015-07-22 IMAGING — CT CT ABD-PELV W/O CM
2 of 4 series · 10 of 46 positions shown, 11 images · non-contrast
Comparison: 10/24/2013

CLINICAL DATA: Evaluate for bleed. Anemia and low back pain.
Long-term anticoagulant use.

EXAM:
CT ABDOMEN AND PELVIS WITHOUT CONTRAST
TECHNIQUE: Multidetector CT imaging of the abdomen and pelvis was performed
following the standard protocol without IV contrast.

[Series 201: routine, idose (2) · axial · 0.98mm/px · z∈[+190,+615]mm · 7 of 103 slices shown, 8 images]
[im 9/103  soft-tissue]
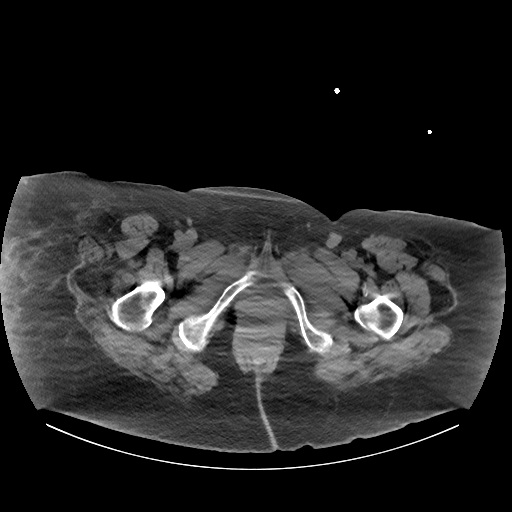
[im 9/103  bone]
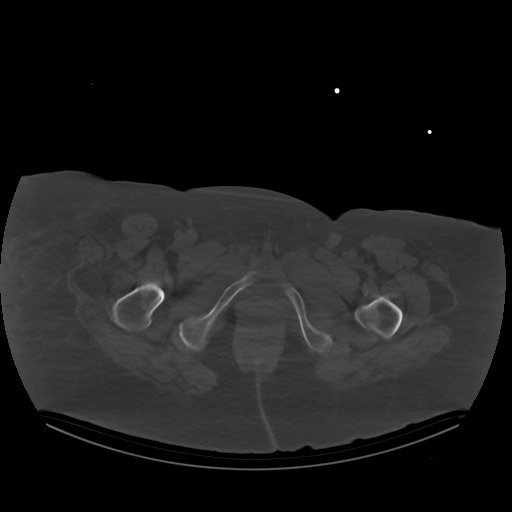
[im 22/103  soft-tissue]
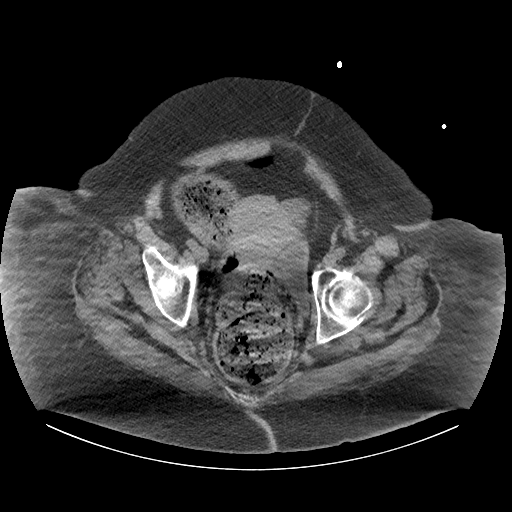
[im 39/103  soft-tissue]
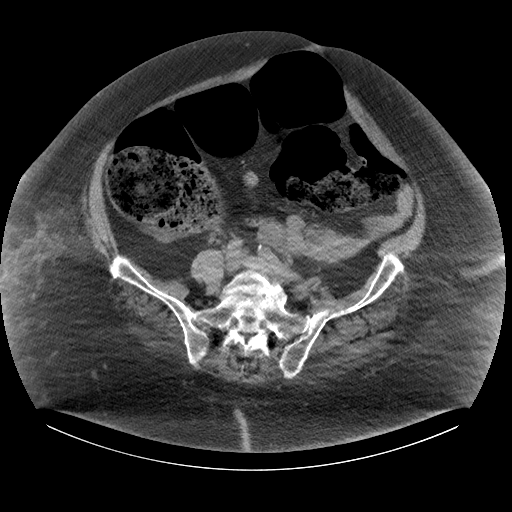
[im 52/103  soft-tissue]
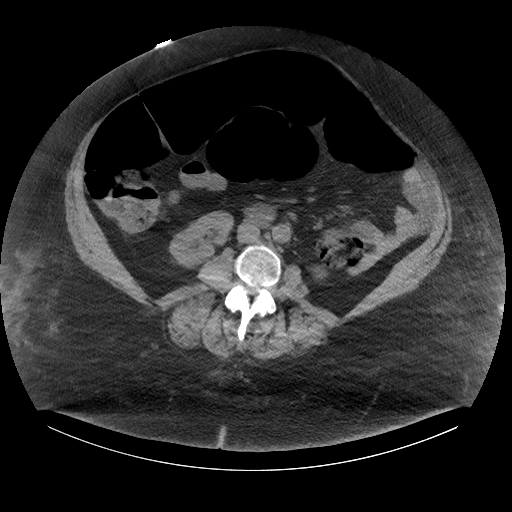
[im 64/103  soft-tissue]
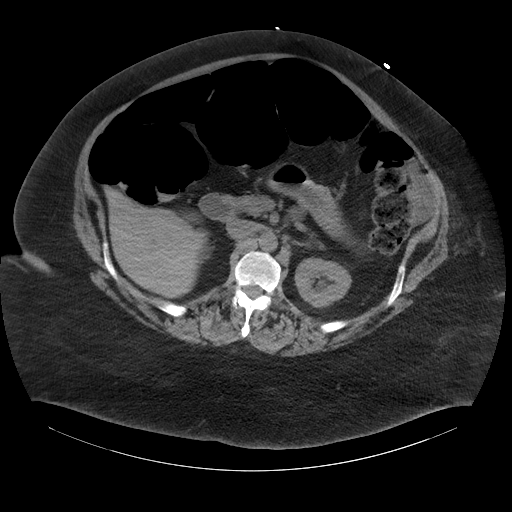
[im 81/103  soft-tissue]
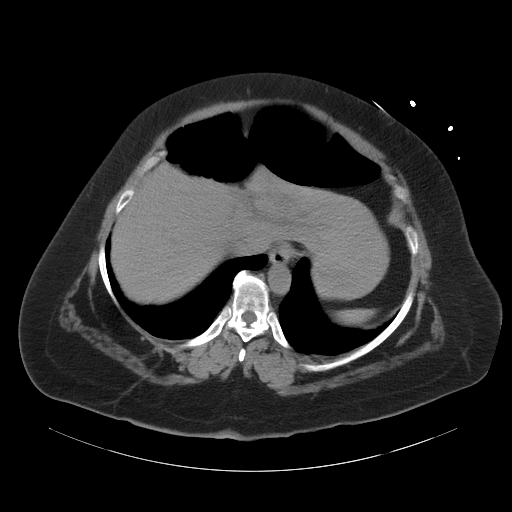
[im 94/103  soft-tissue]
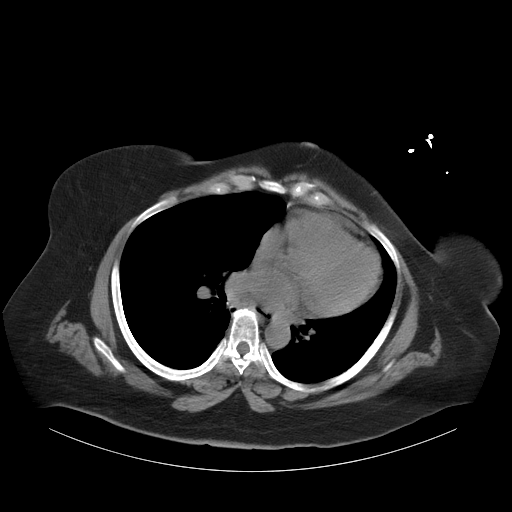

[Series 202: coronals, idose (2) · coronal · 0.50mm/px · 3 of 190 slices shown]
[im 64/190  soft-tissue]
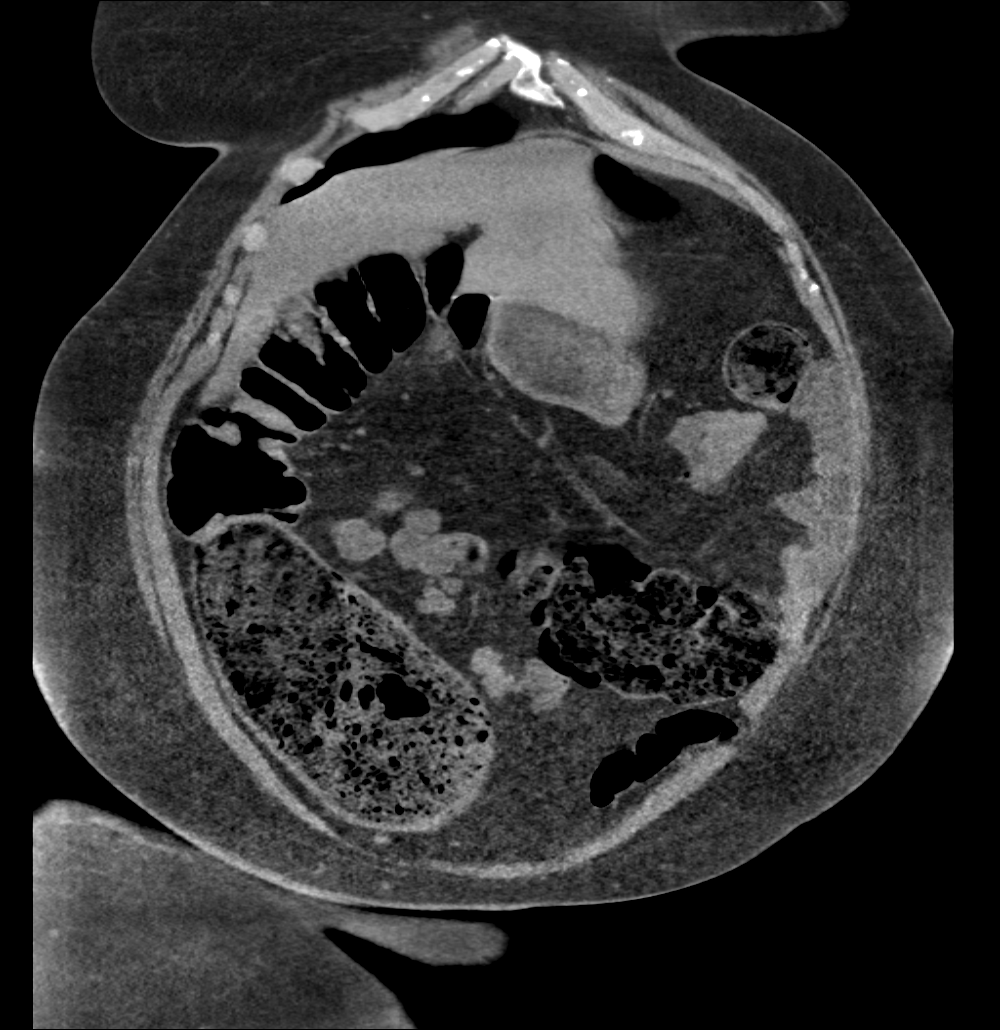
[im 85/190  soft-tissue]
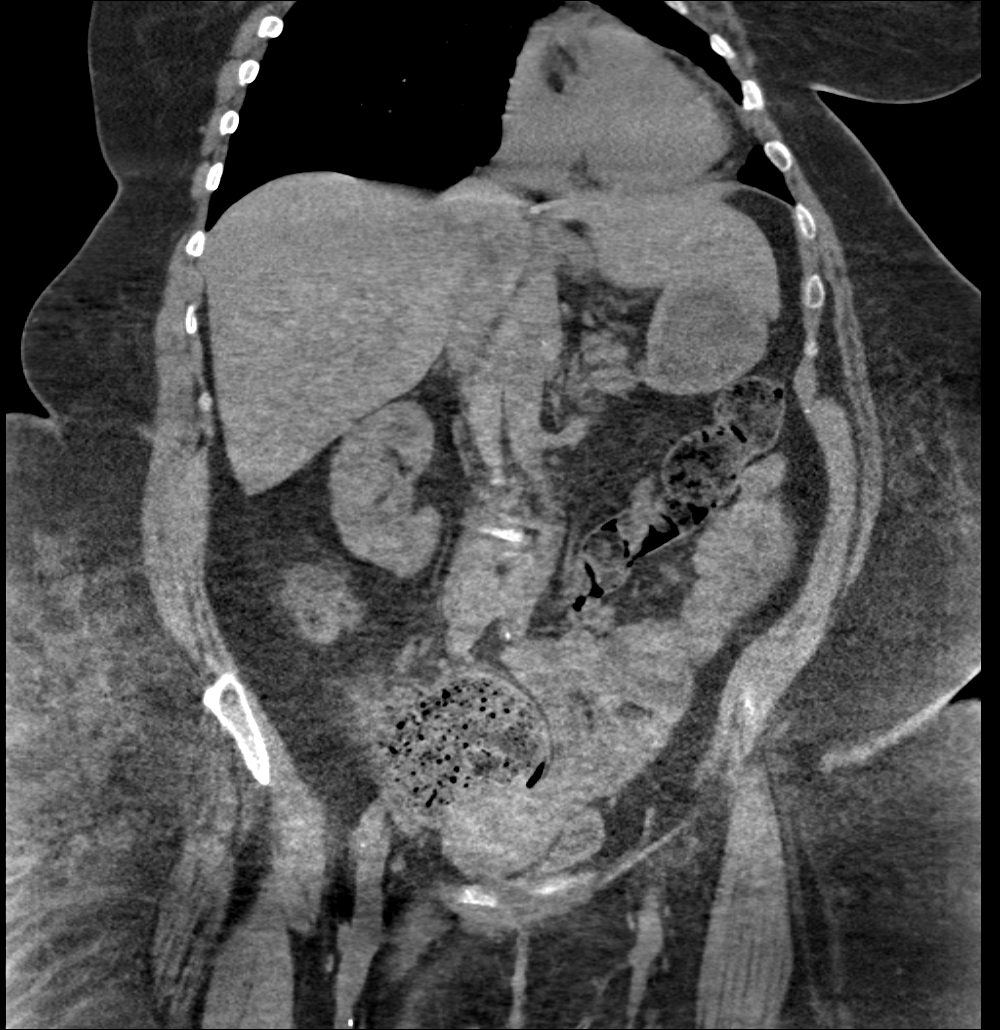
[im 106/190  soft-tissue]
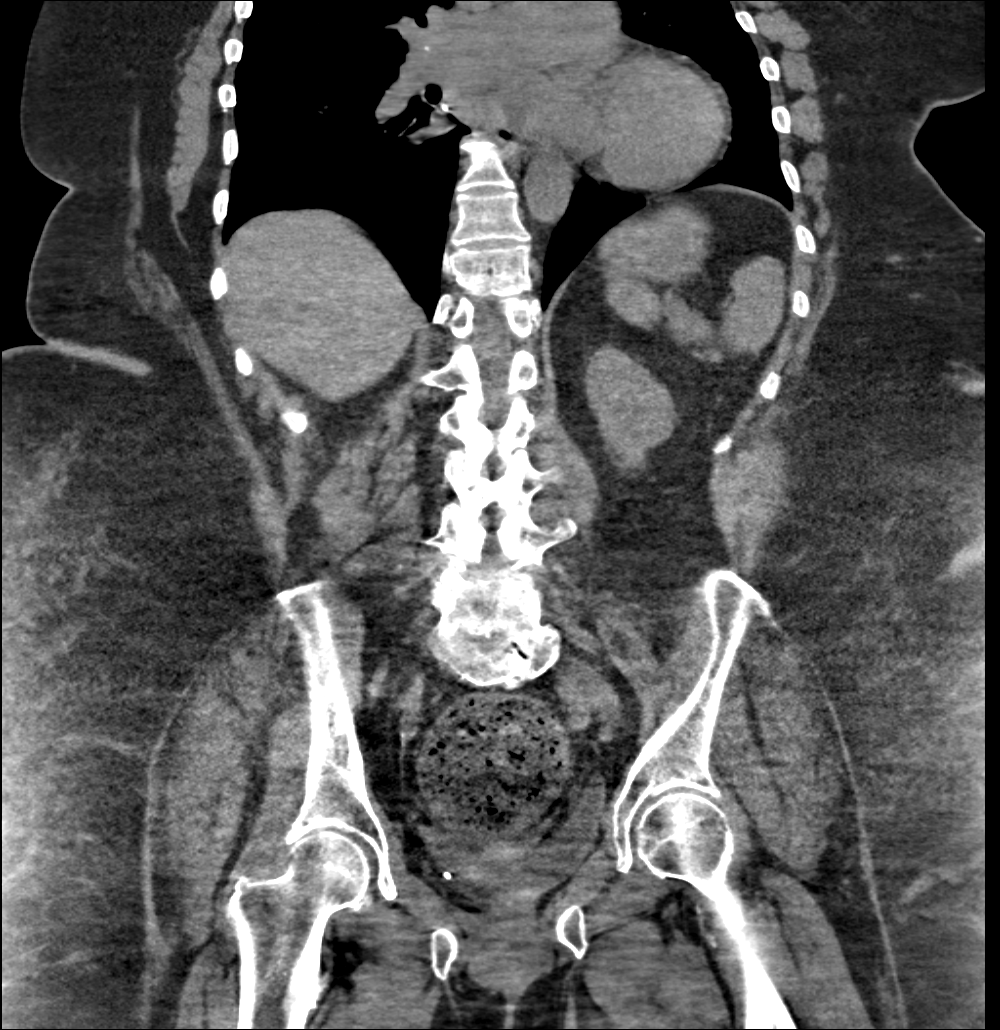

[10 of 46 positions shown; findings below may reference images not displayed]

FINDINGS: Slight fibrosis in the lung bases. Prominent density demonstrated in
the subcarinal region may represent mass or lymphadenopathy or could
represent enlarged pulmonary artery. Small amount of pericardial
fluid or thickening. Calcified granulomas adjacent to the EG
junction region.

The unenhanced appearance of the liver, spleen, gallbladder,
pancreas, adrenal glands, kidneys, abdominal aorta, inferior vena
cava, and retroperitoneal lymph nodes is unremarkable. Stomach and
small bowel are decompressed. Prominent stool-filled rectosigmoid
colon with gaseous distention of the right hand transverse colon.
This likely suggests constipation with pseudo-obstruction. No free
air or free fluid in the abdomen. No abdominal or retroperitoneal
hematomas demonstrated. Nonspecific vague infiltration in the
visualize subcutaneous fat.

Pelvis: Bladder is decompressed. Uterus and ovaries are not
enlarged. No free or loculated pelvic fluid collections. No pelvic
mass or lymphadenopathy. No evidence of pelvic hematoma.
Degenerative changes in the spine. Slight anterior subluxation of L4
on L5 is probably degenerative. No destructive bone lesions.
IMPRESSION: No evidence of abdominal, pelvic, or retroperitoneal hematoma.
Prominent stool-filled rectosigmoid colon with gaseous distention of
the remainder of the colon suggesting obstipation with
pseudo-obstruction. Mass versus prominent vascularity in the
subcarinal region.

## 2015-07-22 IMAGING — US US RENAL
1 series · 14 of 25 positions shown · non-contrast
Comparison: None.

CLINICAL DATA: Acute renal insufficiency, initial evaluation

EXAM:
RENAL/URINARY TRACT ULTRASOUND COMPLETE

[Series 1: us renal · 0.28mm/px · 14 of 29 slices shown]
[im 1/29]
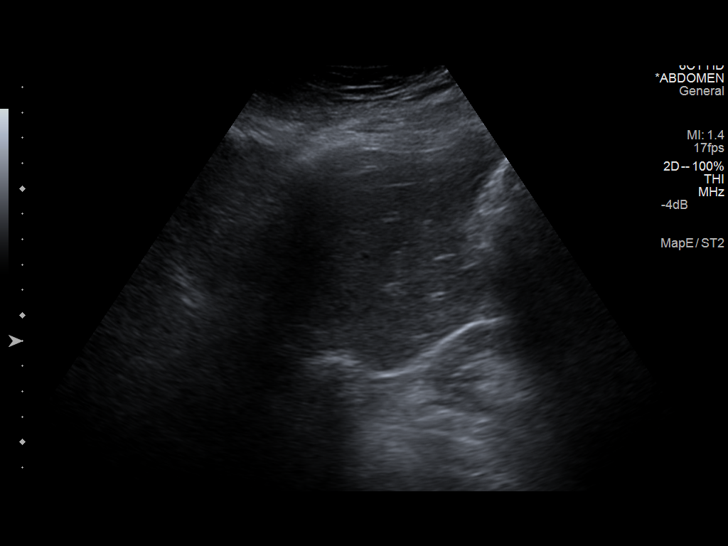
[im 3/29]
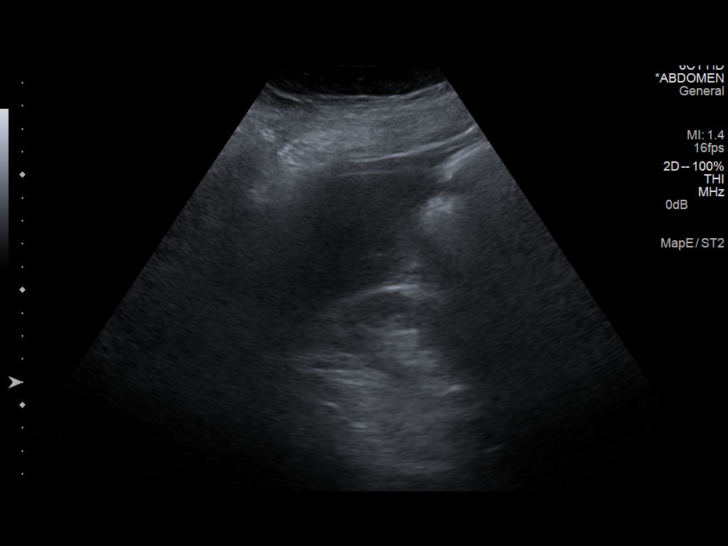
[im 5/29]
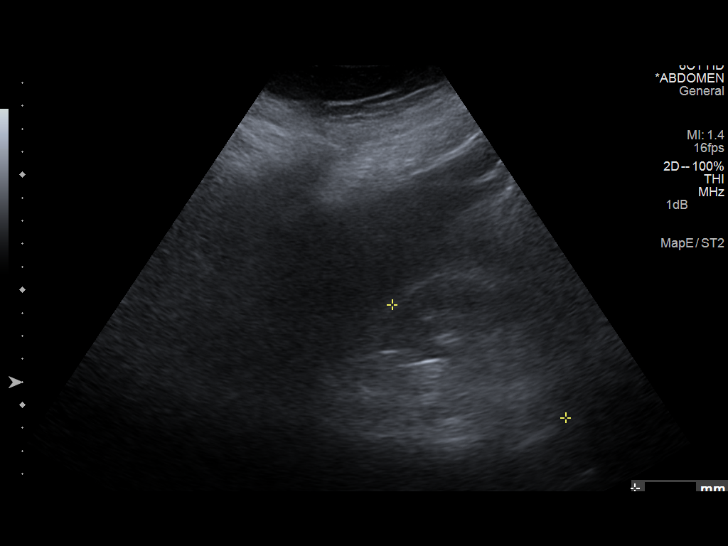
[im 8/29]
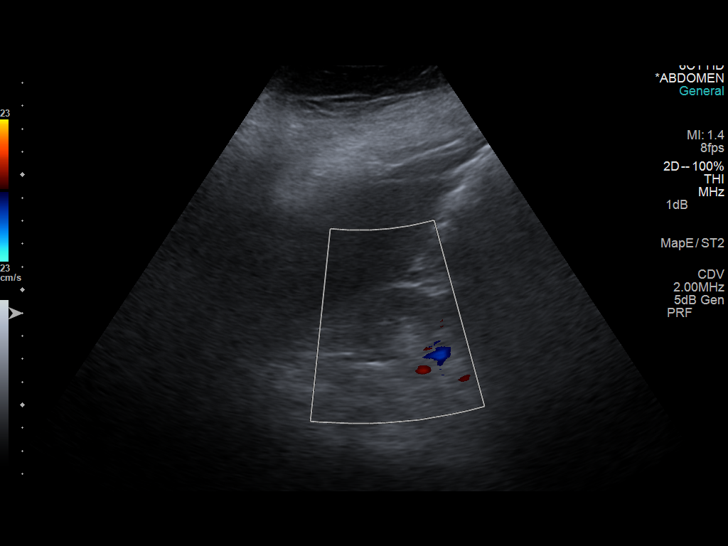
[im 10/29]
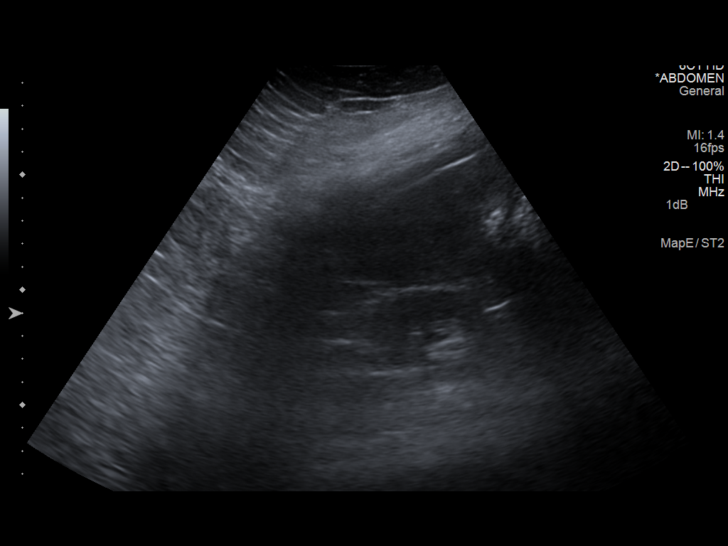
[im 11/29]
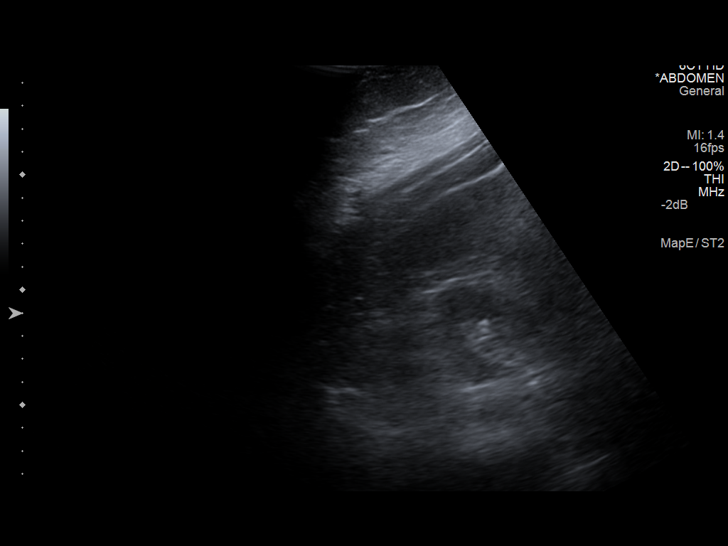
[im 13/29]
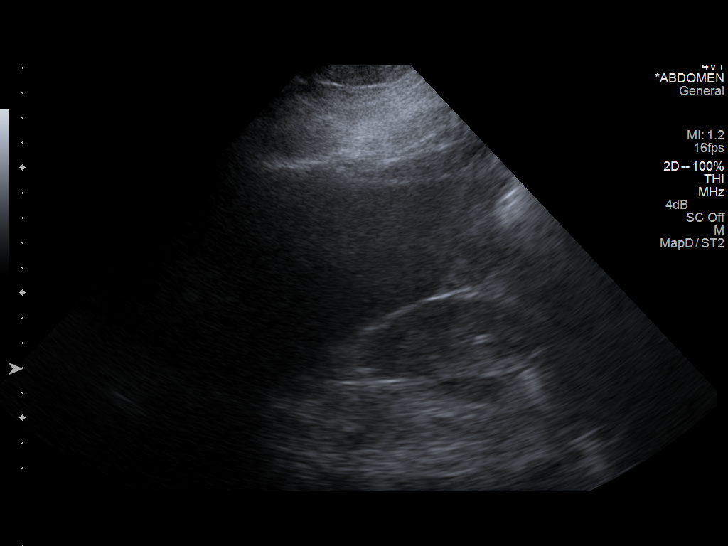
[im 16/29]
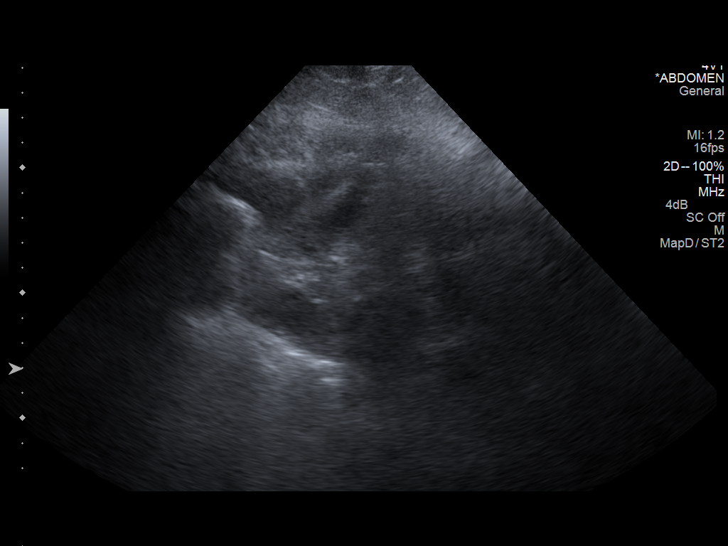
[im 18/29]
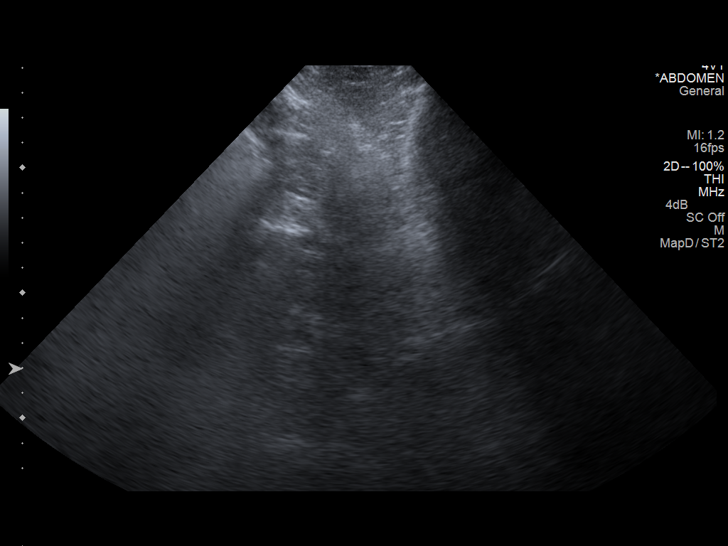
[im 19/29]
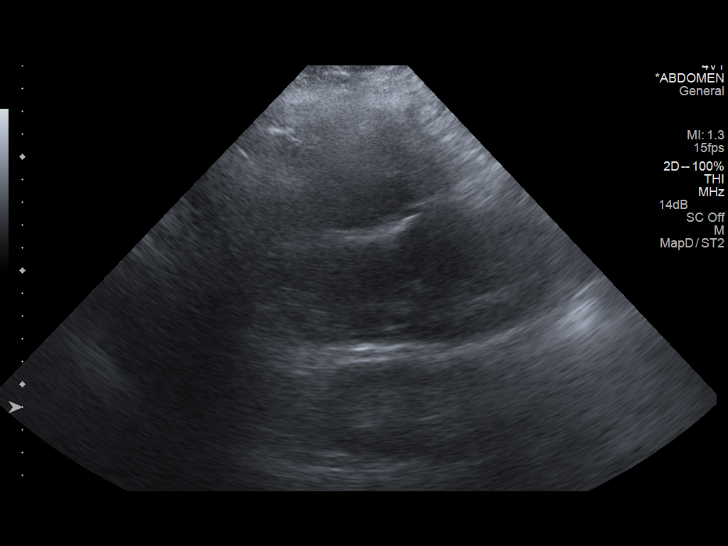
[im 22/29]
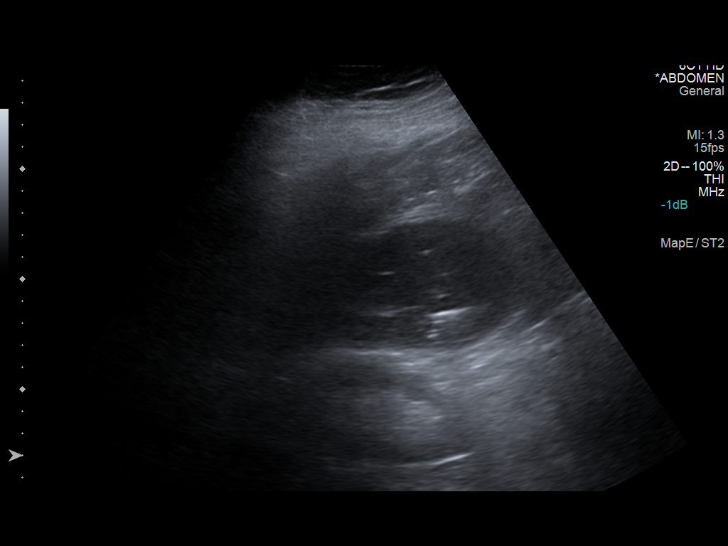
[im 24/29]
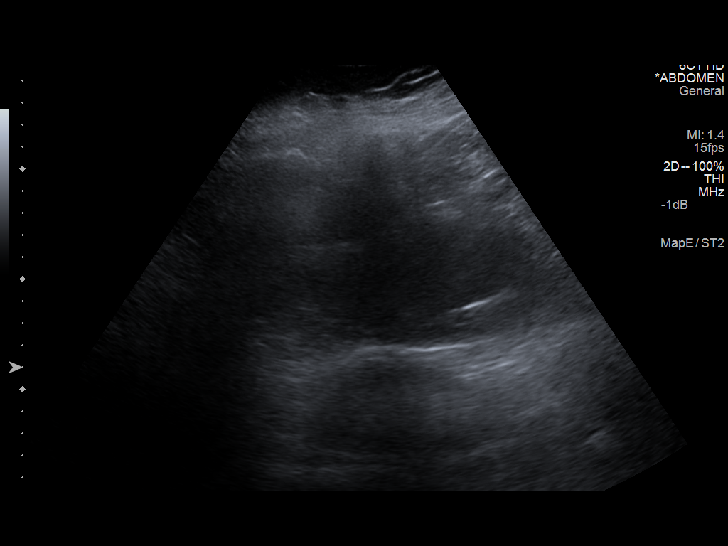
[im 26/29]
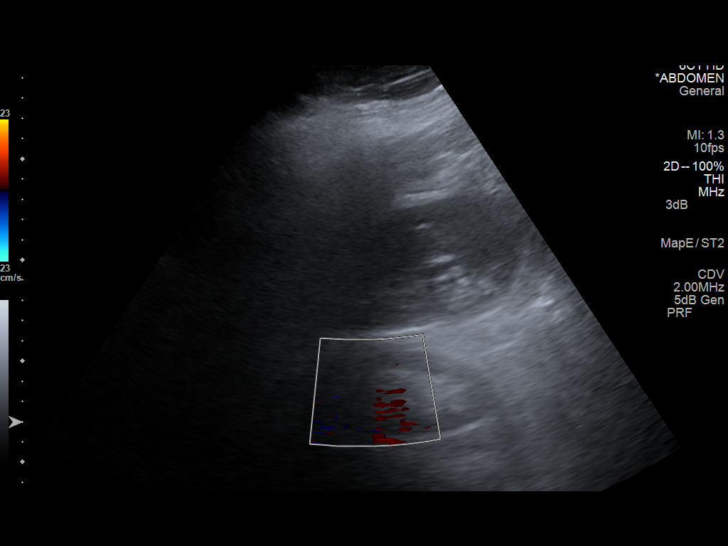
[im 29/29]
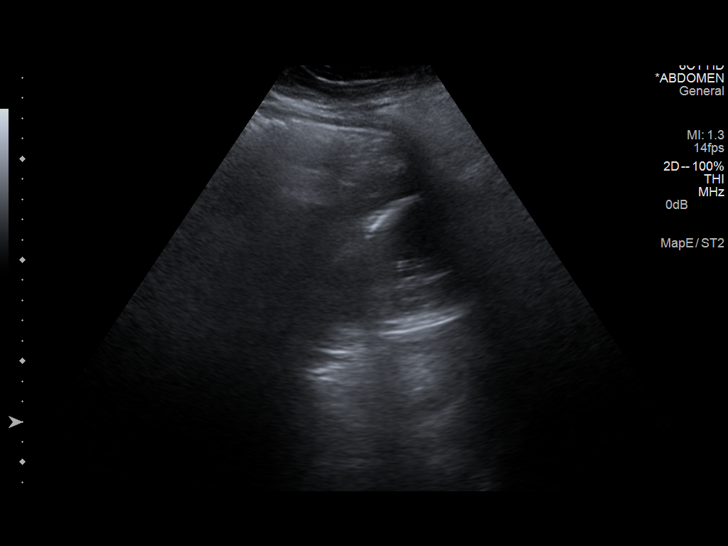

[14 of 25 positions shown; findings below may reference images not displayed]

FINDINGS: Right Kidney:

Length: 9.0 cm. Echogenicity within normal limits. No mass or
hydronephrosis visualized.

Left Kidney:

Length: 9.9 cm. Echogenicity within normal limits. No mass or
hydronephrosis visualized.

Bladder:

Not visualized, likely decompressed by a known Foley catheter
IMPRESSION: Study is moderately limited by patient body habitus but appears
grossly normal.

## 2015-07-29 ENCOUNTER — Non-Acute Institutional Stay (SKILLED_NURSING_FACILITY): Payer: Medicare Other | Admitting: Adult Health

## 2015-07-29 DIAGNOSIS — K5901 Slow transit constipation: Secondary | ICD-10-CM

## 2015-07-29 DIAGNOSIS — E039 Hypothyroidism, unspecified: Secondary | ICD-10-CM

## 2015-07-29 DIAGNOSIS — E1143 Type 2 diabetes mellitus with diabetic autonomic (poly)neuropathy: Secondary | ICD-10-CM

## 2015-07-29 DIAGNOSIS — I10 Essential (primary) hypertension: Secondary | ICD-10-CM

## 2015-07-29 DIAGNOSIS — E1165 Type 2 diabetes mellitus with hyperglycemia: Secondary | ICD-10-CM

## 2015-07-29 DIAGNOSIS — IMO0001 Reserved for inherently not codable concepts without codable children: Secondary | ICD-10-CM

## 2015-09-02 ENCOUNTER — Non-Acute Institutional Stay (SKILLED_NURSING_FACILITY): Payer: Medicare Other | Admitting: Adult Health

## 2015-09-02 DIAGNOSIS — I10 Essential (primary) hypertension: Secondary | ICD-10-CM

## 2015-09-02 DIAGNOSIS — K5901 Slow transit constipation: Secondary | ICD-10-CM | POA: Diagnosis not present

## 2015-09-02 DIAGNOSIS — F32A Depression, unspecified: Secondary | ICD-10-CM

## 2015-09-02 DIAGNOSIS — E038 Other specified hypothyroidism: Secondary | ICD-10-CM | POA: Diagnosis not present

## 2015-09-02 DIAGNOSIS — E034 Atrophy of thyroid (acquired): Secondary | ICD-10-CM | POA: Diagnosis not present

## 2015-09-02 DIAGNOSIS — G8929 Other chronic pain: Secondary | ICD-10-CM

## 2015-09-02 DIAGNOSIS — E1165 Type 2 diabetes mellitus with hyperglycemia: Secondary | ICD-10-CM | POA: Diagnosis not present

## 2015-09-02 DIAGNOSIS — F329 Major depressive disorder, single episode, unspecified: Secondary | ICD-10-CM | POA: Diagnosis not present

## 2015-09-02 DIAGNOSIS — M109 Gout, unspecified: Secondary | ICD-10-CM

## 2015-09-02 DIAGNOSIS — E1143 Type 2 diabetes mellitus with diabetic autonomic (poly)neuropathy: Secondary | ICD-10-CM | POA: Diagnosis not present

## 2015-09-02 DIAGNOSIS — IMO0001 Reserved for inherently not codable concepts without codable children: Secondary | ICD-10-CM

## 2015-09-12 ENCOUNTER — Ambulatory Visit: Payer: Medicare Other | Admitting: Podiatry

## 2015-09-20 ENCOUNTER — Encounter: Payer: Self-pay | Admitting: Adult Health

## 2015-09-20 NOTE — Progress Notes (Signed)
Patient ID: Lauren Barber, female   DOB: [birth year], 59 y.o.   MRN: [id number]    Facility: [facility]      No Known Allergies  Chief Complaint  Patient presents with  . Medical Management of Chronic Issues    HPI:  She is a long term resident of this facility being seen for the management of her chronic illnesses. Overall her status remains stable. She states that she is feeling good; but has a rash on her left breast; would like to see dermatology.    Past Medical History  Diagnosis Date  . Hypertension   . Obesities, morbid ([place])   . Vaginal bleeding   . Endometriosis   . Atopic conjunctivitis   . Diabetes mellitus without complication ([place])   . Cellulitis     legs  . Insomnia   . Cardiomegaly   . Gout     feet  . Anemia   . Stroke [facility]) 2013  . CVA (cerebral vascular accident) ([place])   . Anxiety   . Headache(784.0)     otc meds prn  . Degenerative, intervertebral disc     back, legs  . History of blood transfusion [place] - unsure number of units transfused  . Neuromuscular disorder ([place])     diabetic neuropathy - feet  . Depressive disorder     depressive disorder    Past Surgical History  Procedure Laterality Date  . Cesarean section  1982, 1989    x 2  . Biopsy endometrial N/A 10 03 2013  . Dilation and curettage of uterus  2013  . Tonsillectomy    . Wisdom tooth extraction    . Hysteroscopy w/d&c N/A 06/28/2014    Procedure: DILATATION AND CURETTAGE /HYSTEROSCOPY;  Surgeon: Lavonia Drafts, MD;  Location: [facility];  Service: Gynecology;  Laterality: N/A;    VITAL SIGNS BP 130/78 mmHg  Pulse 88  Ht 5\' 9"  (1.753 m)  Wt 338 lb (153.316 kg)  BMI 49.89 kg/m2  Patient's Medications  New Prescriptions   No medications on file  Previous Medications   ACETAMINOPHEN (TYLENOL) 325 MG TABLET    Take 650 mg by mouth every 6 (six) hours as needed for moderate pain, fever or headache.    ALLOPURINOL (ZYLOPRIM) 100 MG  TABLET    Take 200 mg by mouth daily.   AMITRIPTYLINE (ELAVIL) 100 MG TABLET    Take 100 mg by mouth at bedtime.   DIPHENHYDRAMINE (BENADRYL) 25 MG TABLET    Take 25 mg by mouth every 8 (eight) hours as needed for itching. For itching   DOCUSATE SODIUM (COLACE) 100 MG CAPSULE    Take 1 capsule (100 mg total) by mouth 2 (two) times daily.   EPINEPHRINE (EPIPEN) 0.3 MG/0.3 ML DEVI    Inject 0.3 mg into the muscle once.   FLUOXETINE (PROZAC) 20 MG CAPSULE    Take 2 capsules (40 mg total) by mouth daily.   FUROSEMIDE (LASIX) 20 MG TABLET    Take 20 mg by mouth daily.   GABAPENTIN (NEURONTIN) 300 MG CAPSULE    Take 300 mg by mouth at bedtime.   LACTULOSE (CHRONULAC) 10 GM/15ML SOLUTION    Take 10 g by mouth daily. constipation   LEVOTHYROXINE (SYNTHROID, LEVOTHROID) 25 MCG TABLET    Take 25 mcg by mouth daily before breakfast.   LISINOPRIL (PRINIVIL,ZESTRIL) 2.5 MG TABLET    Take 2.5 mg by mouth daily.   MELATONIN 3 MG TABS  Take 3 mg by mouth at bedtime.   METHOCARBAMOL (ROBAXIN) 500 MG TABLET    Take 500 mg by mouth every 8 (eight) hours as needed for muscle spasms.   PANTOPRAZOLE (PROTONIX) 40 MG TABLET    Take 1 tablet (40 mg total) by mouth daily.   POLYETHYLENE GLYCOL (MIRALAX / GLYCOLAX) PACKET    Take 17 g by mouth daily.   SODIUM CHLORIDE (OCEAN) 0.65 % SOLN NASAL SPRAY    Place 1 spray into both nostrils as needed for congestion.   TIZANIDINE (ZANAFLEX) 4 MG TABLET    Take 4 mg by mouth every 8 (eight) hours as needed for muscle spasms.   TRAMADOL (ULTRAM) 50 MG TABLET    Take 4 tablets (200 mg total) by mouth every 12 (twelve) hours. For pain  Modified Medications   No medications on file  Discontinued Medications     SIGNIFICANT DIAGNOSTIC EXAMS   12-14-14 ct of head: No evidence of acute intracranial abnormality. Small vessel ischemic changes with intracranial atherosclerosis.  12-14-14: chest x-ray: Elevation left hemidiaphragm. No edema or consolidation. Mild cardiac  enlargement.  12-15-14: ct of abdomen and pelvis: No evidence of abdominal, pelvic, or retroperitoneal hematoma. Prominent stool-filled rectosigmoid colon with gaseous distention of the remainder of the colon suggesting obstipation with pseudo-obstruction. Mass versus prominent vascularity in the subcarinal region.   12-15-14: renal ultrasound: Study is moderately limited by patient body habitus but appears grossly normal.   12-15-14: ct of chest: 1. While the mediastinal structures are difficult to evaluate without IV contrast, there does not appear to be a subcarinal mass and the findings on comparison CT likely represent the left atrium. 2. Left basilar atelectasis.  12-15-14: EEG: This EEG is abnormal with mild localized nonspecific continuous slowing of cerebral activity. No evidence of an epileptic disorder was demonstrated. Lack of epileptiform activity during EEG recording does not rule out seizure disorder, however  04-29-15: right lower extremity doppler: neg for dvt  06-01-15: No active cardiopulmonary disease.Right-sided PICC line with the tip projecting over the SVC     LABS REVIEWED:   09-10-14: hgb a1c 6.4 10-25-14: hgb a1c 6.5 10-29-14: chol 146; ldl 89; trig 127; hdl 32; urine micro-albumin <2.0 11-03-14: wbc 9.5; hgb 8.1; hct 25.3; mcv 96.;3 plt 268 12-14-14: wbc 1.4; hgb 7.6; hgb 22.8; mcv 93.1 plt 356; glucose 79; bun 89; creat 4.15; k+4.4 ;na++133; liver normal albumin 3.5; tsh 4.786; vit b12: 1064; folate 15.5 12-14-14: (ED)wbc 9.1; hgb 7.5; hct 22.2; mcv 91.5; plt 339; glucose 95; bun 97; creat 4.74; k+4.5 na++133; liver normal albumin 3.4; tsh 3.898; ammonia 38 12-15-14: wbc 9.8; hgb 7.5; hct 23.0; mcv 90.6; plt 299; glucose 81; bun 95; creat 4.81; k+4.2; na++133; liver normal albumin 3.0; uric acid 53; LDH 124; iron 38; tibc 154 PTH 93 12-16-14: urine culture no growth  12-17-14: wbc 7.0; hgb 8.9; hct 26.8; mcv 89.9; plt 309 12-19-14: glucose 109; bun 26; creat 1.71; k+4.3;  na++139 12-23-14: glucose 102; bun 21; creat 1.53; k+4.7; na++138; tsh 4.567 01-21-15: wbc 8.0; hgb 9.6; hct 30.3; mcv 95.9; plt 296  03-11-15: tsh 3.818 04-01-15: glucose 98; bun 22; creat 1.70; k+4.8; na++138  05-02-15: hgb a1c 6.2 06-01-15: wbc 6.5 ;hgb 9.7 hct 30.0; mcv 94.3; plt 203; glucose 102; bun 15; creat 1.62; k+ 4.6 na++131; liver normal albumin 3.0; blood culture: no growth 06-02-15: hgb a1c 6.3; tsh 2.979; mag 1.6; phos 3.2; urine culture: no growth 06-03-15: wbc 5.0 hgb 9.5; hct 28.8; mcv 96.0;  plt 192; glucose 130; bun 13; creat 1.55; k+4.3; na++134 06-10-15: wbc 5.6 hgb 8.1; hct 24.7; mcv 91.5; plt 181; glucose 108; bun 17; creat 1.50; k+ 4.0; na++139  06-20-15: glucose 21; bun 21; creat 1.95; k+ 4.8; na++135; mag 1.9     Review of Systems Constitutional: Negative for appetite change and fatigue.  HENT: Negative for congestion.   Respiratory: Negative for cough, chest tightness and shortness of breath.   Cardiovascular: Negative for chest pain and palpitations. No edema  Gastrointestinal: Negative for nausea, abdominal pain, diarrhea and constipation.  Musculoskeletal: no complaints of pain  Skin: Negative for pallor.  Neurological: Negative for dizziness.  Psychiatric/Behavioral: The patient is not nervous/anxious.     Physical Exam Constitutional: She is oriented to person, place, and time. No distress.  Morbidly obese   Eyes: Conjunctivae are normal.  Neck: Neck supple. No JVD present. No thyromegaly present.  Cardiovascular: Normal rate, regular rhythm and intact distal pulses.   Respiratory: Effort normal and breath sounds normal. No respiratory distress. She has no wheezes.  GI: Soft. Bowel sounds are normal. She exhibits no distension. There is no tenderness.  Musculoskeletal: She exhibits no edema.  Able to move all extremities  Has limited range of motion due to obesity   Lymphadenopathy:    She has no cervical adenopathy.  Neurological: She is alert and oriented  to person, place, and time.  Skin: Skin is warm and dry. She is not diaphoretic.  Psychiatric: She has a normal mood and affect.      ASSESSMENT/ PLAN:  1. Gout: no recent flares will continue allopurinol 200 mg daily will monitor  2. DVT: she has completed her xarelto therapy; no further swelling present  will monitor   3. Chronic pain: her pain is being managed with her current regimen will continue ultram 200 mg twice daily; elavil 100 mg nightly; zanaflex 4 mg every 8 hours as needed robaxin 500 mg every 8 hours as needed;    4. Gerd: will continue protonix 40 mg daily  5. Constipation: colace twice daily; lactulose 10 gm daily miralax daily   6. Depression: she is emotionally stable; will continue prozac 40 mg daily and takes melatonin 3 mg nightly for sleep.   7. Diabetes: she is presently not taking medications since aug 2015; her last hgb a1c was 6.2; will check cbg twice daily   8. Hypertension:  Will begin lisinopril 2.5 mg daily; will have nursing check blood pressure twice daily and will check bmp in 2 weeks   9. Hypothyroidism: will continue  synthroid 25 mcg daily   tsh is 2.797     Ok Edwards NP [facility]  Contact [phone] Monday through Friday 8am- 5pm  After hours call [phone]

## 2015-09-26 ENCOUNTER — Encounter: Payer: Self-pay | Admitting: Podiatry

## 2015-09-26 ENCOUNTER — Ambulatory Visit (INDEPENDENT_AMBULATORY_CARE_PROVIDER_SITE_OTHER): Payer: Medicare Other | Admitting: Podiatry

## 2015-09-26 VITALS — BP 106/62 | HR 96 | Resp 16

## 2015-09-26 DIAGNOSIS — L600 Ingrowing nail: Secondary | ICD-10-CM

## 2015-09-26 DIAGNOSIS — E119 Type 2 diabetes mellitus without complications: Secondary | ICD-10-CM

## 2015-09-26 DIAGNOSIS — M79674 Pain in right toe(s): Secondary | ICD-10-CM

## 2015-09-26 NOTE — Patient Instructions (Signed)

## 2015-09-27 ENCOUNTER — Telehealth: Payer: Self-pay | Admitting: *Deleted

## 2015-09-27 NOTE — Progress Notes (Signed)
Subjective:     Patient ID: Lauren Barber, female   DOB: 04-Feb-1956, 59 y.o.   MRN: [id number]  HPI patient presents stating I got a painful right great toenail that makes it hard for me to wear shoe and I've tried to trim it and soak it without relief   Review of Systems  All other systems reviewed and are negative.      Objective:   Physical Exam  Constitutional: She is oriented to person, place, and time.  Cardiovascular: Intact distal pulses.   Musculoskeletal: Normal range of motion.  Neurological: She is oriented to person, place, and time.  Skin: Skin is warm.  Nursing note and vitals reviewed.  neurovascular status intact muscle strength adequate range of motion within normal limits with patient noted to have incurvated medial border right hallux that's painful when pressed with distal redness but no current drainage noted. Patient's found to be well oriented 3 and has good digital perfusion     Assessment:     Ingrown toenail deformity right hallux medial border that's painful when pressed    Plan:     H&P condition reviewed. I've recommended correction and she wants this done and I explained procedure and risk and patient wants surgery. Today I infiltrated the right hallux 60 mg Xylocaine Marcaine mixture remove the medial border exposed matrix and applied phenol 3 applications 30 seconds followed by alcohol lavage and sterile dressing. Gave instructions on soaks and reappoint

## 2015-09-27 NOTE — Telephone Encounter (Signed)
Left message for patient at [phone] (Cell #) to check to see how they were doing from their ingrown toenail procedure that was performed on Monday, September 26, 2015. Waiting for a response.

## 2015-10-03 ENCOUNTER — Non-Acute Institutional Stay (SKILLED_NURSING_FACILITY): Payer: Medicare Other | Admitting: Adult Health

## 2015-10-03 DIAGNOSIS — E034 Atrophy of thyroid (acquired): Secondary | ICD-10-CM | POA: Diagnosis not present

## 2015-10-03 DIAGNOSIS — K5901 Slow transit constipation: Secondary | ICD-10-CM

## 2015-10-03 DIAGNOSIS — M109 Gout, unspecified: Secondary | ICD-10-CM

## 2015-10-03 DIAGNOSIS — I82499 Acute embolism and thrombosis of other specified deep vein of unspecified lower extremity: Secondary | ICD-10-CM | POA: Diagnosis not present

## 2015-10-03 DIAGNOSIS — I10 Essential (primary) hypertension: Secondary | ICD-10-CM

## 2015-10-03 DIAGNOSIS — E038 Other specified hypothyroidism: Secondary | ICD-10-CM

## 2015-10-03 DIAGNOSIS — IMO0001 Reserved for inherently not codable concepts without codable children: Secondary | ICD-10-CM

## 2015-10-03 DIAGNOSIS — E1165 Type 2 diabetes mellitus with hyperglycemia: Secondary | ICD-10-CM | POA: Diagnosis not present

## 2015-10-03 DIAGNOSIS — R609 Edema, unspecified: Secondary | ICD-10-CM

## 2015-10-03 DIAGNOSIS — E1143 Type 2 diabetes mellitus with diabetic autonomic (poly)neuropathy: Secondary | ICD-10-CM | POA: Diagnosis not present

## 2015-10-03 DIAGNOSIS — D649 Anemia, unspecified: Secondary | ICD-10-CM | POA: Diagnosis not present

## 2015-10-04 ENCOUNTER — Other Ambulatory Visit: Payer: Self-pay

## 2015-10-04 MED ORDER — OXYCODONE HCL 5 MG PO CAPS: 5.0000 mg | ORAL_CAPSULE | ORAL | Status: DC | PRN

## 2015-10-06 ENCOUNTER — Other Ambulatory Visit: Payer: Self-pay | Admitting: *Deleted

## 2015-10-06 MED ORDER — OXYCODONE HCL 5 MG PO CAPS: ORAL_CAPSULE | ORAL | Status: DC

## 2015-10-06 NOTE — Telephone Encounter (Signed)
[facility] 

## 2015-10-18 ENCOUNTER — Non-Acute Institutional Stay (SKILLED_NURSING_FACILITY): Payer: Medicare Other | Admitting: Adult Health

## 2015-10-18 DIAGNOSIS — E038 Other specified hypothyroidism: Secondary | ICD-10-CM | POA: Diagnosis not present

## 2015-10-18 DIAGNOSIS — E781 Pure hyperglyceridemia: Secondary | ICD-10-CM | POA: Diagnosis not present

## 2015-10-18 DIAGNOSIS — E034 Atrophy of thyroid (acquired): Secondary | ICD-10-CM

## 2015-11-07 ENCOUNTER — Encounter: Payer: Self-pay | Admitting: Adult Health

## 2015-11-07 NOTE — Progress Notes (Signed)
Patient ID: Lauren Barber, female   DOB: 11-Feb-1956, 59 y.o.   MRN: [id number]    Facility:  [facility]      No Known Allergies  Chief Complaint  Patient presents with  . Medical Management of Chronic Issues    HPI:  She is a long term resident of this facility being seen for the management of her chronic illnesses. She has an ingrown right great toe nail. There is no drainage present; the toe is slightly inflamed; she does have pain present. She will need to be seen by podiatry.     Past Medical History  Diagnosis Date  . Hypertension   . Obesities, morbid ([place])   . Vaginal bleeding   . Endometriosis   . Atopic conjunctivitis   . Diabetes mellitus without complication ([place])   . Cellulitis     legs  . Insomnia   . Cardiomegaly   . Gout     feet  . Anemia   . Stroke [facility]) 2013  . CVA (cerebral vascular accident) ([place])   . Anxiety   . Headache(784.0)     otc meds prn  . Degenerative, intervertebral disc     back, legs  . History of blood transfusion [place] - unsure number of units transfused  . Neuromuscular disorder ([place])     diabetic neuropathy - feet  . Depressive disorder     depressive disorder    Past Surgical History  Procedure Laterality Date  . Cesarean section  1982, 1989    x 2  . Biopsy endometrial N/A 10 03 2013  . Dilation and curettage of uterus  2013  . Tonsillectomy    . Wisdom tooth extraction    . Hysteroscopy w/d&c N/A 06/28/2014    Procedure: DILATATION AND CURETTAGE /HYSTEROSCOPY;  Surgeon: Lavonia Drafts, MD;  Location: [facility];  Service: Gynecology;  Laterality: N/A;    VITAL SIGNS BP 143/82 mmHg  Pulse 87  Ht 5\' 9"  (1.753 m)  Wt 343 lb (155.584 kg)  BMI 50.63 kg/m2  Patient's Medications  New Prescriptions   No medications on file  Previous Medications   ACETAMINOPHEN (TYLENOL) 325 MG TABLET    Take 650 mg by mouth every 6 (six) hours as needed for moderate pain, fever or headache.    ALLOPURINOL (ZYLOPRIM)  100 MG TABLET    Take 200 mg by mouth daily.   AMITRIPTYLINE (ELAVIL) 100 MG TABLET    Take 100 mg by mouth at bedtime.   DIPHENHYDRAMINE (BENADRYL) 25 MG TABLET    Take 25 mg by mouth every 8 (eight) hours as needed for itching. For itching   DOCUSATE SODIUM (COLACE) 100 MG CAPSULE    Take 1 capsule (100 mg total) by mouth 2 (two) times daily.   EPINEPHRINE (EPIPEN) 0.3 MG/0.3 ML DEVI    Inject 0.3 mg into the muscle once.   FLUOXETINE (PROZAC) 20 MG CAPSULE    Take 2 capsules (40 mg total) by mouth daily.   FUROSEMIDE (LASIX) 20 MG TABLET    Take 20 mg by mouth daily.   GABAPENTIN (NEURONTIN) 300 MG CAPSULE    Take 300 mg by mouth at bedtime.   LACTULOSE (CHRONULAC) 10 GM/15ML SOLUTION    Take 10 g by mouth daily. constipation   LEVOTHYROXINE (SYNTHROID, LEVOTHROID) 25 MCG TABLET    Take 25 mcg by mouth daily before breakfast.   LISINOPRIL (PRINIVIL,ZESTRIL) 2.5 MG TABLET    Take 2.5 mg by mouth daily.  MELATONIN 3 MG TABS    Take 3 mg by mouth at bedtime.   METHOCARBAMOL (ROBAXIN) 500 MG TABLET    Take 500 mg by mouth every 8 (eight) hours as needed for muscle spasms.   OXYCODONE (OXY-IR) 5 MG CAPSULE    Take one tablet by mouth every 4 hours as needed for pain   PANTOPRAZOLE (PROTONIX) 40 MG TABLET    Take 1 tablet (40 mg total) by mouth daily.   POLYETHYLENE GLYCOL (MIRALAX / GLYCOLAX) PACKET    Take 17 g by mouth daily.   SODIUM CHLORIDE (OCEAN) 0.65 % SOLN NASAL SPRAY    Place 1 spray into both nostrils as needed for congestion.   TIZANIDINE (ZANAFLEX) 4 MG TABLET    Take 4 mg by mouth every 8 (eight) hours as needed for muscle spasms.   TRAMADOL (ULTRAM) 50 MG TABLET    Take 4 tablets (200 mg total) by mouth every 12 (twelve) hours. For pain  Modified Medications   No medications on file  Discontinued Medications   No medications on file     SIGNIFICANT DIAGNOSTIC EXAMS   12-14-14 ct of head: No evidence of acute intracranial abnormality. Small vessel ischemic changes with  intracranial atherosclerosis.  12-14-14: chest x-ray: Elevation left hemidiaphragm. No edema or consolidation. Mild cardiac enlargement.  12-15-14: ct of abdomen and pelvis: No evidence of abdominal, pelvic, or retroperitoneal hematoma. Prominent stool-filled rectosigmoid colon with gaseous distention of the remainder of the colon suggesting obstipation with pseudo-obstruction. Mass versus prominent vascularity in the subcarinal region.   12-15-14: renal ultrasound: Study is moderately limited by patient body habitus but appears grossly normal.   12-15-14: ct of chest: 1. While the mediastinal structures are difficult to evaluate without IV contrast, there does not appear to be a subcarinal mass and the findings on comparison CT likely represent the left atrium. 2. Left basilar atelectasis.  12-15-14: EEG: This EEG is abnormal with mild localized nonspecific continuous slowing of cerebral activity. No evidence of an epileptic disorder was demonstrated. Lack of epileptiform activity during EEG recording does not rule out seizure disorder, however  04-29-15: right lower extremity doppler: neg for dvt  06-01-15: No active cardiopulmonary disease.Right-sided PICC line with the tip projecting over the SVC     LABS REVIEWED:   09-10-14: hgb a1c 6.4 10-25-14: hgb a1c 6.5 10-29-14: chol 146; ldl 89; trig 127; hdl 32; urine micro-albumin <2.0 11-03-14: wbc 9.5; hgb 8.1; hct 25.3; mcv 96.;3 plt 268 12-14-14: wbc 1.4; hgb 7.6; hgb 22.8; mcv 93.1 plt 356; glucose 79; bun 89; creat 4.15; k+4.4 ;na++133; liver normal albumin 3.5; tsh 4.786; vit b12: 1064; folate 15.5 12-14-14: (ED)wbc 9.1; hgb 7.5; hct 22.2; mcv 91.5; plt 339; glucose 95; bun 97; creat 4.74; k+4.5 na++133; liver normal albumin 3.4; tsh 3.898; ammonia 38 12-15-14: wbc 9.8; hgb 7.5; hct 23.0; mcv 90.6; plt 299; glucose 81; bun 95; creat 4.81; k+4.2; na++133; liver normal albumin 3.0; uric acid 53; LDH 124; iron 38; tibc 154 PTH 93 12-16-14: urine  culture no growth  12-17-14: wbc 7.0; hgb 8.9; hct 26.8; mcv 89.9; plt 309 12-19-14: glucose 109; bun 26; creat 1.71; k+4.3; na++139 12-23-14: glucose 102; bun 21; creat 1.53; k+4.7; na++138; tsh 4.567 01-21-15: wbc 8.0; hgb 9.6; hct 30.3; mcv 95.9; plt 296  03-11-15: tsh 3.818 04-01-15: glucose 98; bun 22; creat 1.70; k+4.8; na++138  05-02-15: hgb a1c 6.2 06-01-15: wbc 6.5 ;hgb 9.7 hct 30.0; mcv 94.3; plt 203; glucose 102; bun 15; creat  1.62; k+ 4.6 na++131; liver normal albumin 3.0; blood culture: no growth 06-02-15: hgb a1c 6.3; tsh 2.979; mag 1.6; phos 3.2; urine culture: no growth 06-03-15: wbc 5.0 hgb 9.5; hct 28.8; mcv 96.0; plt 192; glucose 130; bun 13; creat 1.55; k+4.3; na++134 06-10-15: wbc 5.6 hgb 8.1; hct 24.7; mcv 91.5; plt 181; glucose 108; bun 17; creat 1.50; k+ 4.0; na++139  06-20-15: glucose 21; bun 21; creat 1.95; k+ 4.8; na++135; mag 1.9     Review of Systems Constitutional: Negative for appetite change and fatigue.  HENT: Negative for congestion.   Respiratory: Negative for cough, chest tightness and shortness of breath.   Cardiovascular: Negative for chest pain and palpitations. No edema  Gastrointestinal: Negative for nausea, abdominal pain, diarrhea and constipation.  Musculoskeletal: no complaints of pain  Skin: Negative for pallor.  Neurological: Negative for dizziness.  Psychiatric/Behavioral: The patient is not nervous/anxious.     Physical Exam Constitutional: She is oriented to person, place, and time. No distress.  Morbidly obese   Eyes: Conjunctivae are normal.  Neck: Neck supple. No JVD present. No thyromegaly present.  Cardiovascular: Normal rate, regular rhythm and intact distal pulses.   Respiratory: Effort normal and breath sounds normal. No respiratory distress. She has no wheezes.  GI: Soft. Bowel sounds are normal. She exhibits no distension. There is no tenderness.  Musculoskeletal: She exhibits no edema.  Able to move all extremities  Has limited  range of motion due to obesity   Lymphadenopathy:    She has no cervical adenopathy.  Neurological: She is alert and oriented to person, place, and time.  Skin: Skin is warm and dry. She is not diaphoretic.  Right great toe nail is ingrown  Psychiatric: She has a normal mood and affect.      ASSESSMENT/ PLAN:  1. Gout: no recent flares will continue allopurinol 200 mg daily will monitor  2. DVT: she has completed her xarelto therapy; no further swelling present  will monitor   3. Chronic pain: her pain is being managed with her current regimen will continue ultram 200 mg twice daily; elavil 100 mg nightly; zanaflex 4 mg every 8 hours as needed robaxin 500 mg every 8 hours as needed;    4. Gerd: will continue protonix 40 mg daily  5. Constipation: colace twice daily; lactulose 10 gm daily miralax daily   6. Depression: she is emotionally stable; will continue prozac 40 mg daily and takes melatonin 3 mg nightly for sleep.   7. Diabetes: she is presently not taking medications since aug 2015; her last hgb a1c was 6.2; will check cbg twice daily   8. Hypertension:  Will begin lisinopril 2.5 mg daily; will have nursing check blood pressure twice daily and will check bmp in 2 weeks   9. Hypothyroidism: will continue  synthroid 25 mcg daily   tsh is 2.797    Will setup a podiatry appointment for her ingrown toe nail. Will monitor        Ok Edwards NP [facility]  Contact [phone] Monday through Friday 8am- 5pm  After hours call [phone]

## 2015-11-10 ENCOUNTER — Non-Acute Institutional Stay (SKILLED_NURSING_FACILITY): Payer: Medicare Other | Admitting: Adult Health

## 2015-11-10 DIAGNOSIS — IMO0001 Reserved for inherently not codable concepts without codable children: Secondary | ICD-10-CM

## 2015-11-10 DIAGNOSIS — E1143 Type 2 diabetes mellitus with diabetic autonomic (poly)neuropathy: Secondary | ICD-10-CM

## 2015-11-10 DIAGNOSIS — E1165 Type 2 diabetes mellitus with hyperglycemia: Secondary | ICD-10-CM

## 2015-11-10 DIAGNOSIS — M109 Gout, unspecified: Secondary | ICD-10-CM

## 2015-11-10 DIAGNOSIS — E781 Pure hyperglyceridemia: Secondary | ICD-10-CM | POA: Diagnosis not present

## 2015-11-10 DIAGNOSIS — G8929 Other chronic pain: Secondary | ICD-10-CM | POA: Diagnosis not present

## 2015-11-10 DIAGNOSIS — E034 Atrophy of thyroid (acquired): Secondary | ICD-10-CM | POA: Diagnosis not present

## 2015-11-10 DIAGNOSIS — R609 Edema, unspecified: Secondary | ICD-10-CM | POA: Diagnosis not present

## 2015-11-10 DIAGNOSIS — E038 Other specified hypothyroidism: Secondary | ICD-10-CM | POA: Diagnosis not present

## 2015-11-10 DIAGNOSIS — I10 Essential (primary) hypertension: Secondary | ICD-10-CM

## 2015-11-10 DIAGNOSIS — K5901 Slow transit constipation: Secondary | ICD-10-CM

## 2015-11-14 ENCOUNTER — Encounter: Payer: Self-pay | Admitting: Adult Health

## 2015-11-14 MED ORDER — LINAGLIPTIN 5 MG PO TABS: 5.0000 mg | ORAL_TABLET | Freq: Every day | ORAL | Status: DC

## 2015-11-14 NOTE — Progress Notes (Signed)
Patient ID: Lauren Barber, female   DOB: [birth year], 59 y.o.   MRN: [id number]    Facility: [facility]      No Known Allergies  Chief Complaint  Patient presents with  . Medical Management of Chronic Issues    HPI:  She is a long term resident of this facility being seen for the management of her chronic illnesses. Her cbg's are elevated. She tells me that she is following the diet the facility has given her. We talked at length regarding the progression of diabetes. She as seen podiatry on 10-06-15 for her right great toenail ingrown. The area is now infected with redness and swelling present. She has worsening bilateral lower extremity edema.    Past Medical History  Diagnosis Date  . Hypertension   . Obesities, morbid ([place])   . Vaginal bleeding   . Endometriosis   . Atopic conjunctivitis   . Diabetes mellitus without complication ([place])   . Cellulitis     legs  . Insomnia   . Cardiomegaly   . Gout     feet  . Anemia   . Stroke [facility]) 2013  . CVA (cerebral vascular accident) ([place])   . Anxiety   . Headache(784.0)     otc meds prn  . Degenerative, intervertebral disc     back, legs  . History of blood transfusion [place] - unsure number of units transfused  . Neuromuscular disorder ([place])     diabetic neuropathy - feet  . Depressive disorder     depressive disorder    Past Surgical History  Procedure Laterality Date  . Cesarean section  1982, 1989    x 2  . Biopsy endometrial N/A 10 03 2013  . Dilation and curettage of uterus  2013  . Tonsillectomy    . Wisdom tooth extraction    . Hysteroscopy w/d&c N/A 06/28/2014    Procedure: DILATATION AND CURETTAGE /HYSTEROSCOPY;  Surgeon: Lavonia Drafts, MD;  Location: [facility];  Service: Gynecology;  Laterality: N/A;    VITAL SIGNS BP 118/75 mmHg  Pulse 69  Ht 5\' 9"  (1.753 m)  Wt 343 lb (155.584 kg)  BMI 50.63 kg/m2  Patient's Medications  New Prescriptions   No medications on file  Previous  Medications   ACETAMINOPHEN (TYLENOL) 325 MG TABLET    Take 650 mg by mouth every 6 (six) hours as needed for moderate pain, fever or headache.    ALLOPURINOL (ZYLOPRIM) 100 MG TABLET    Take 200 mg by mouth daily.   AMITRIPTYLINE (ELAVIL) 100 MG TABLET    Take 100 mg by mouth at bedtime.   DIPHENHYDRAMINE (BENADRYL) 25 MG TABLET    Take 25 mg by mouth every 8 (eight) hours as needed for itching. For itching   DOCUSATE SODIUM (COLACE) 100 MG CAPSULE    Take 1 capsule (100 mg total) by mouth 2 (two) times daily.   EPINEPHRINE (EPIPEN) 0.3 MG/0.3 ML DEVI    Inject 0.3 mg into the muscle once.   FLUOXETINE (PROZAC) 20 MG CAPSULE    Take 2 capsules (40 mg total) by mouth daily.   FUROSEMIDE (LASIX) 20 MG TABLET    Take 20 mg by mouth daily.   GABAPENTIN (NEURONTIN) 300 MG CAPSULE    Take 300 mg by mouth at bedtime.   LACTULOSE (CHRONULAC) 10 GM/15ML SOLUTION    Take 10 g by mouth daily. constipation   LEVOTHYROXINE (SYNTHROID, LEVOTHROID) 25 MCG TABLET    Take  25 mcg by mouth daily before breakfast.   LISINOPRIL (PRINIVIL,ZESTRIL) 2.5 MG TABLET    Take 2.5 mg by mouth daily.   MELATONIN 3 MG TABS    Take 3 mg by mouth at bedtime.   METHOCARBAMOL (ROBAXIN) 500 MG TABLET    Take 500 mg by mouth every 8 (eight) hours as needed for muscle spasms.   OXYCODONE (OXY-IR) 5 MG CAPSULE    Take one tablet by mouth every 4 hours as needed for pain   PANTOPRAZOLE (PROTONIX) 40 MG TABLET    Take 1 tablet (40 mg total) by mouth daily.   POLYETHYLENE GLYCOL (MIRALAX / GLYCOLAX) PACKET    Take 17 g by mouth daily.   SODIUM CHLORIDE (OCEAN) 0.65 % SOLN NASAL SPRAY    Place 1 spray into both nostrils as needed for congestion.   TIZANIDINE (ZANAFLEX) 4 MG TABLET    Take 4 mg by mouth every 8 (eight) hours as needed for muscle spasms.   TRAMADOL (ULTRAM) 50 MG TABLET    Take 4 tablets (200 mg total) by mouth every 12 (twelve) hours. For pain  Modified Medications   No medications on file  Discontinued Medications    No medications on file     SIGNIFICANT DIAGNOSTIC EXAMS   12-14-14 ct of head: No evidence of acute intracranial abnormality. Small vessel ischemic changes with intracranial atherosclerosis.  12-14-14: chest x-ray: Elevation left hemidiaphragm. No edema or consolidation. Mild cardiac enlargement.  12-15-14: ct of abdomen and pelvis: No evidence of abdominal, pelvic, or retroperitoneal hematoma. Prominent stool-filled rectosigmoid colon with gaseous distention of the remainder of the colon suggesting obstipation with pseudo-obstruction. Mass versus prominent vascularity in the subcarinal region.   12-15-14: renal ultrasound: Study is moderately limited by patient body habitus but appears grossly normal.   12-15-14: ct of chest: 1. While the mediastinal structures are difficult to evaluate without IV contrast, there does not appear to be a subcarinal mass and the findings on comparison CT likely represent the left atrium. 2. Left basilar atelectasis.  12-15-14: EEG: This EEG is abnormal with mild localized nonspecific continuous slowing of cerebral activity. No evidence of an epileptic disorder was demonstrated. Lack of epileptiform activity during EEG recording does not rule out seizure disorder, however  04-29-15: right lower extremity doppler: neg for dvt  06-01-15: No active cardiopulmonary disease.Right-sided PICC line with the tip projecting over the SVC     LABS REVIEWED:   10-25-14: hgb a1c 6.5 10-29-14: chol 146; ldl 89; trig 127; hdl 32; urine micro-albumin <2.0 11-03-14: wbc 9.5; hgb 8.1; hct 25.3; mcv 96.;3 plt 268 12-14-14: wbc 1.4; hgb 7.6; hgb 22.8; mcv 93.1 plt 356; glucose 79; bun 89; creat 4.15; k+4.4 ;na++133; liver normal albumin 3.5; tsh 4.786; vit b12: 1064; folate 15.5 12-14-14: (ED)wbc 9.1; hgb 7.5; hct 22.2; mcv 91.5; plt 339; glucose 95; bun 97; creat 4.74; k+4.5 na++133; liver normal albumin 3.4; tsh 3.898; ammonia 38 12-15-14: wbc 9.8; hgb 7.5; hct 23.0; mcv 90.6;  plt 299; glucose 81; bun 95; creat 4.81; k+4.2; na++133; liver normal albumin 3.0; uric acid 53; LDH 124; iron 38; tibc 154 PTH 93 12-16-14: urine culture no growth  12-17-14: wbc 7.0; hgb 8.9; hct 26.8; mcv 89.9; plt 309 12-19-14: glucose 109; bun 26; creat 1.71; k+4.3; na++139 12-23-14: glucose 102; bun 21; creat 1.53; k+4.7; na++138; tsh 4.567 01-21-15: wbc 8.0; hgb 9.6; hct 30.3; mcv 95.9; plt 296  03-11-15: tsh 3.818 04-01-15: glucose 98; bun 22; creat 1.70; k+4.8; na++138  05-02-15: hgb a1c 6.2 06-01-15: wbc 6.5 ;hgb 9.7 hct 30.0; mcv 94.3; plt 203; glucose 102; bun 15; creat 1.62; k+ 4.6 na++131; liver normal albumin 3.0; blood culture: no growth 06-02-15: hgb a1c 6.3; tsh 2.979; mag 1.6; phos 3.2; urine culture: no growth 06-03-15: wbc 5.0 hgb 9.5; hct 28.8; mcv 96.0; plt 192; glucose 130; bun 13; creat 1.55; k+4.3; na++134 06-10-15: wbc 5.6 hgb 8.1; hct 24.7; mcv 91.5; plt 181; glucose 108; bun 17; creat 1.50; k+ 4.0; na++139  06-20-15: glucose 21; bun 21; creat 1.95; k+ 4.8; na++135; mag 1.9     Review of Systems Constitutional: Negative for appetite change and fatigue.  HENT: Negative for congestion.   Respiratory: Negative for cough, chest tightness and shortness of breath.   Cardiovascular: Negative for chest pain and palpitations. No edema  Gastrointestinal: Negative for nausea, abdominal pain, diarrhea and constipation.  Musculoskeletal: has painful right great toe nail with swelling present  Skin: Negative for pallor.  Neurological: Negative for dizziness.  Psychiatric/Behavioral: The patient is not nervous/anxious.     Physical Exam Constitutional: She is oriented to person, place, and time. No distress.  Morbidly obese   Eyes: Conjunctivae are normal.  Neck: Neck supple. No JVD present. No thyromegaly present.  Cardiovascular: Normal rate, regular rhythm and intact distal pulses.   Respiratory: Effort normal and breath sounds normal. No respiratory distress. She has no wheezes.    GI: Soft. Bowel sounds are normal. She exhibits no distension. There is no tenderness.  Musculoskeletal: She exhibits 2+ lower extremity edema   Able to move all extremities  Has limited range of motion due to obesity   Lymphadenopathy:    She has no cervical adenopathy.  Neurological: She is alert and oriented to person, place, and time.  Skin: Skin is warm and dry. She is not diaphoretic.  Right great toe nail is ingrown red and inflamed  Psychiatric: She has a normal mood and affect.      ASSESSMENT/ PLAN:  1. Gout: no recent flares will continue allopurinol 200 mg daily will monitor  2. DVT: she has completed her xarelto therapy;  will monitor   3. Chronic pain: her pain is being managed with her current regimen will continue ultram 200 mg twice daily; elavil 100 mg nightly; zanaflex 4 mg every 8 hours as needed robaxin 500 mg every 8 hours as needed;    4. Gerd: will continue protonix 40 mg daily  5. Constipation: colace twice daily; lactulose 10 gm daily miralax daily   6. Depression: she is emotionally stable; will continue prozac 40 mg daily and takes melatonin 3 mg nightly for sleep.   7. Diabetes: she is presently not taking medications since aug 2015; her last hgb a1c was 6.2;her cbg's are worsening will begin tradjenta 5 mg daily  8. Hypertension:  Will continue lisinopril 2.5 mg daily;   9. Hypothyroidism: will continue  synthroid 25 mcg daily   tsh is 2.797  10. Right great ingrown toe nail: will being doxycyline 100 mg twice daily for 2 weeks with florastor twice daily; will begin oxycodone 5 mg every 4 hours as needed for 30 days only   11. Lower extremity edema: will increase lasix to 40 mg daily     In one week will check cbc; cmp; lipids; hgb a1c tsh     Ok Edwards NP [facility]  Contact [phone] Monday through Friday 8am- 5pm  After hours call [phone]

## 2015-11-18 ENCOUNTER — Encounter: Payer: Self-pay | Admitting: Adult Health

## 2015-11-18 DIAGNOSIS — E781 Pure hyperglyceridemia: Secondary | ICD-10-CM | POA: Insufficient documentation

## 2015-11-18 MED ORDER — LEVOTHYROXINE SODIUM 50 MCG PO TABS: 50.0000 ug | ORAL_TABLET | Freq: Every day | ORAL | Status: DC

## 2015-11-18 MED ORDER — FENOFIBRATE 40 MG PO TABS: 40.0000 mg | ORAL_TABLET | Freq: Every day | ORAL | Status: DC

## 2015-11-18 NOTE — Progress Notes (Signed)
Patient ID: Lauren Barber, female   DOB: 23-Sep-1956, 59 y.o.   MRN: [id number]   Facility: [facility]      No Known Allergies  Chief Complaint  Patient presents with  . Acute Visit    follow up lab work     HPI:  Her tsh is elevated at 10.013 and her trig are elevated at 222. She does state that she is more tired than normal. She is having edema in her right lower extremity with a painful calf present. There are no reports of fever present.   Past Medical History  Diagnosis Date  . Hypertension   . Obesities, morbid ([place])   . Vaginal bleeding   . Endometriosis   . Atopic conjunctivitis   . Diabetes mellitus without complication ([place])   . Cellulitis     legs  . Insomnia   . Cardiomegaly   . Gout     feet  . Anemia   . Stroke [facility]) 2013  . CVA (cerebral vascular accident) ([place])   . Anxiety   . Headache(784.0)     otc meds prn  . Degenerative, intervertebral disc     back, legs  . History of blood transfusion [place] - unsure number of units transfused  . Neuromuscular disorder ([place])     diabetic neuropathy - feet  . Depressive disorder     depressive disorder    Past Surgical History  Procedure Laterality Date  . Cesarean section  1982, 1989    x 2  . Biopsy endometrial N/A 10 03 2013  . Dilation and curettage of uterus  2013  . Tonsillectomy    . Wisdom tooth extraction    . Hysteroscopy w/d&c N/A 06/28/2014    Procedure: DILATATION AND CURETTAGE /HYSTEROSCOPY;  Surgeon: Lavonia Drafts, MD;  Location: [facility];  Service: Gynecology;  Laterality: N/A;    VITAL SIGNS BP 105/70 mmHg  Pulse 90  Ht 5\' 9"  (1.753 m)  Wt 343 lb (155.584 kg)  BMI 50.63 kg/m2  Patient's Medications  New Prescriptions   No medications on file  Previous Medications   ACETAMINOPHEN (TYLENOL) 325 MG TABLET    Take 650 mg by mouth every 6 (six) hours as needed for moderate pain, fever or headache.    ALLOPURINOL (ZYLOPRIM) 100 MG TABLET    Take 200 mg by mouth  daily.   AMITRIPTYLINE (ELAVIL) 100 MG TABLET    Take 100 mg by mouth at bedtime.   DIPHENHYDRAMINE (BENADRYL) 25 MG TABLET    Take 25 mg by mouth every 8 (eight) hours as needed for itching. For itching   DOCUSATE SODIUM (COLACE) 100 MG CAPSULE    Take 1 capsule (100 mg total) by mouth 2 (two) times daily.   EPINEPHRINE (EPIPEN) 0.3 MG/0.3 ML DEVI    Inject 0.3 mg into the muscle once.   FLUOXETINE (PROZAC) 20 MG CAPSULE    Take 2 capsules (40 mg total) by mouth daily.   FUROSEMIDE (LASIX) 20 MG TABLET    Take 20 mg by mouth daily.   GABAPENTIN (NEURONTIN) 300 MG CAPSULE    Take 300 mg by mouth at bedtime.   LACTULOSE (CHRONULAC) 10 GM/15ML SOLUTION    Take 10 g by mouth daily. constipation   LEVOTHYROXINE (SYNTHROID, LEVOTHROID) 25 MCG TABLET    Take 25 mcg by mouth daily before breakfast.   LINAGLIPTIN (TRADJENTA) 5 MG TABS TABLET    Take 1 tablet (5 mg total) by mouth daily.  LISINOPRIL (PRINIVIL,ZESTRIL) 2.5 MG TABLET    Take 2.5 mg by mouth daily.   MELATONIN 3 MG TABS    Take 3 mg by mouth at bedtime.   METHOCARBAMOL (ROBAXIN) 500 MG TABLET    Take 500 mg by mouth every 8 (eight) hours as needed for muscle spasms.   OXYCODONE (OXY-IR) 5 MG CAPSULE    Take one tablet by mouth every 4 hours as needed for pain   PANTOPRAZOLE (PROTONIX) 40 MG TABLET    Take 1 tablet (40 mg total) by mouth daily.   POLYETHYLENE GLYCOL (MIRALAX / GLYCOLAX) PACKET    Take 17 g by mouth daily.   SODIUM CHLORIDE (OCEAN) 0.65 % SOLN NASAL SPRAY    Place 1 spray into both nostrils as needed for congestion.   TIZANIDINE (ZANAFLEX) 4 MG TABLET    Take 4 mg by mouth every 8 (eight) hours as needed for muscle spasms.   TRAMADOL (ULTRAM) 50 MG TABLET    Take 4 tablets (200 mg total) by mouth every 12 (twelve) hours. For pain  Modified Medications   No medications on file  Discontinued Medications   No medications on file     SIGNIFICANT DIAGNOSTIC EXAMS   12-14-14 ct of head: No evidence of acute intracranial  abnormality. Small vessel ischemic changes with intracranial atherosclerosis.  12-14-14: chest x-ray: Elevation left hemidiaphragm. No edema or consolidation. Mild cardiac enlargement.  12-15-14: ct of abdomen and pelvis: No evidence of abdominal, pelvic, or retroperitoneal hematoma. Prominent stool-filled rectosigmoid colon with gaseous distention of the remainder of the colon suggesting obstipation with pseudo-obstruction. Mass versus prominent vascularity in the subcarinal region.   12-15-14: renal ultrasound: Study is moderately limited by patient body habitus but appears grossly normal.   12-15-14: ct of chest: 1. While the mediastinal structures are difficult to evaluate without IV contrast, there does not appear to be a subcarinal mass and the findings on comparison CT likely represent the left atrium. 2. Left basilar atelectasis.  12-15-14: EEG: This EEG is abnormal with mild localized nonspecific continuous slowing of cerebral activity. No evidence of an epileptic disorder was demonstrated. Lack of epileptiform activity during EEG recording does not rule out seizure disorder, however  04-29-15: right lower extremity doppler: neg for dvt  06-01-15: No active cardiopulmonary disease.Right-sided PICC line with the tip projecting over the SVC     LABS REVIEWED:   10-25-14: hgb a1c 6.5 10-29-14: chol 146; ldl 89; trig 127; hdl 32; urine micro-albumin <2.0 11-03-14: wbc 9.5; hgb 8.1; hct 25.3; mcv 96.;3 plt 268 12-14-14: wbc 1.4; hgb 7.6; hgb 22.8; mcv 93.1 plt 356; glucose 79; bun 89; creat 4.15; k+4.4 ;na++133; liver normal albumin 3.5; tsh 4.786; vit b12: 1064; folate 15.5 12-14-14: (ED)wbc 9.1; hgb 7.5; hct 22.2; mcv 91.5; plt 339; glucose 95; bun 97; creat 4.74; k+4.5 na++133; liver normal albumin 3.4; tsh 3.898; ammonia 38 12-15-14: wbc 9.8; hgb 7.5; hct 23.0; mcv 90.6; plt 299; glucose 81; bun 95; creat 4.81; k+4.2; na++133; liver normal albumin 3.0; uric acid 53; LDH 124; iron 38; tibc  154 PTH 93 12-16-14: urine culture no growth  12-17-14: wbc 7.0; hgb 8.9; hct 26.8; mcv 89.9; plt 309 12-19-14: glucose 109; bun 26; creat 1.71; k+4.3; na++139 12-23-14: glucose 102; bun 21; creat 1.53; k+4.7; na++138; tsh 4.567 01-21-15: wbc 8.0; hgb 9.6; hct 30.3; mcv 95.9; plt 296  03-11-15: tsh 3.818 04-01-15: glucose 98; bun 22; creat 1.70; k+4.8; na++138  05-02-15: hgb a1c 6.2 06-01-15: wbc 6.5 ;hgb  9.7 hct 30.0; mcv 94.3; plt 203; glucose 102; bun 15; creat 1.62; k+ 4.6 na++131; liver normal albumin 3.0; blood culture: no growth 06-02-15: hgb a1c 6.3; tsh 2.979; mag 1.6; phos 3.2; urine culture: no growth 06-03-15: wbc 5.0 hgb 9.5; hct 28.8; mcv 96.0; plt 192; glucose 130; bun 13; creat 1.55; k+4.3; na++134 06-10-15: wbc 5.6 hgb 8.1; hct 24.7; mcv 91.5; plt 181; glucose 108; bun 17; creat 1.50; k+ 4.0; na++139  06-20-15: glucose 21; bun 21; creat 1.95; k+ 4.8; na++135; mag 1.9  10-10-15: wbc 7.7; hgb 11.2; hct 33.6; mcv 93.6; plt 283 glucose 132; bun 38; creat 2.30; k+ 4.4; na++137; liver normal albumin 3.6; chol 149; ldl 74; trig 222; hdl 31; hgb a1c 6.2; tsh 10.013     Review of Systems Constitutional: Negative for appetite change and fatigue.  HENT: Negative for congestion.   Respiratory: Negative for cough, chest tightness and shortness of breath.   Cardiovascular: Negative for chest pain and palpitations. No edema  Gastrointestinal: Negative for nausea, abdominal pain, diarrhea and constipation.  Musculoskeletal: has painful right lower extremity  Skin: Negative for pallor.  Neurological: Negative for dizziness.  Psychiatric/Behavioral: The patient is not nervous/anxious.     Physical Exam Constitutional: She is oriented to person, place, and time. No distress.  Morbidly obese   Eyes: Conjunctivae are normal.  Neck: Neck supple. No JVD present. No thyromegaly present.  Cardiovascular: Normal rate, regular rhythm and intact distal pulses.   Respiratory: Effort normal and breath sounds  normal. No respiratory distress. She has no wheezes.  GI: Soft. Bowel sounds are normal. She exhibits no distension. There is no tenderness.  Musculoskeletal: She exhibits 2+ lower extremity edema with 3+ edema to right lower extremity with tenderness present   Able to move all extremities  Has limited range of motion due to obesity   Lymphadenopathy:    She has no cervical adenopathy.  Neurological: She is alert and oriented to person, place, and time.  Skin: Skin is warm and dry. She is not diaphoretic.  Psychiatric: She has a normal mood and affect.      ASSESSMENT/ PLAN:  1. Hypothyroidism: will increase her synthroid to 50 mcg daily and will check tsh free t3 and free t4 in 2 months  2. Hypertriglyceridemia: will begin fenofibrate 40 mg daily will check lipids in 2 months  For her right lower extremity edema will get doppler   Ok Edwards NP [facility]  Contact [phone] Monday through Friday 8am- 5pm  After hours call [phone]

## 2015-11-19 ENCOUNTER — Encounter: Payer: Self-pay | Admitting: Adult Health

## 2015-11-19 NOTE — Progress Notes (Signed)
Patient ID: Lauren Barber, female   DOB: [birth year], 57 y.o.   MRN: [id number]    Facility: [facility]      No Known Allergies  Chief Complaint  Patient presents with  . Medical Management of Chronic Issues    HPI:  She is a long term resident of this facility being seen for the management of her chronic illnesses. Overall there is little change in her status. She tells me that she is feeling pretty good and has not concerns today. There are no nursing concerns today.     Past Medical History  Diagnosis Date  . Hypertension   . Obesities, morbid ([place])   . Vaginal bleeding   . Endometriosis   . Atopic conjunctivitis   . Diabetes mellitus without complication ([place])   . Cellulitis     legs  . Insomnia   . Cardiomegaly   . Gout     feet  . Anemia   . Stroke [facility]) 2013  . CVA (cerebral vascular accident) ([place])   . Anxiety   . Headache(784.0)     otc meds prn  . Degenerative, intervertebral disc     back, legs  . History of blood transfusion [place] - unsure number of units transfused  . Neuromuscular disorder ([place])     diabetic neuropathy - feet  . Depressive disorder     depressive disorder    Past Surgical History  Procedure Laterality Date  . Cesarean section  1982, 1989    x 2  . Biopsy endometrial N/A 10 03 2013  . Dilation and curettage of uterus  2013  . Tonsillectomy    . Wisdom tooth extraction    . Hysteroscopy w/d&c N/A 06/28/2014    Procedure: DILATATION AND CURETTAGE /HYSTEROSCOPY;  Surgeon: Lavonia Drafts, MD;  Location: [facility];  Service: Gynecology;  Laterality: N/A;    VITAL SIGNS BP 121/62 mmHg  Pulse 89  Ht 5\' 9"  (1.753 m)  Wt 341 lb (154.677 kg)  BMI 50.33 kg/m2  Patient's Medications  New Prescriptions   No medications on file  Previous Medications   ACETAMINOPHEN (TYLENOL) 325 MG TABLET    Take 650 mg by mouth every 6 (six) hours as needed for moderate pain, fever or headache.    ALLOPURINOL (ZYLOPRIM) 100  MG TABLET    Take 200 mg by mouth daily.   AMITRIPTYLINE (ELAVIL) 100 MG TABLET    Take 100 mg by mouth at bedtime.   DIPHENHYDRAMINE (BENADRYL) 25 MG TABLET    Take 25 mg by mouth every 8 (eight) hours as needed for itching. For itching   DOCUSATE SODIUM (COLACE) 100 MG CAPSULE    Take 1 capsule (100 mg total) by mouth 2 (two) times daily.   EPINEPHRINE (EPIPEN) 0.3 MG/0.3 ML DEVI    Inject 0.3 mg into the muscle once.   FENOFIBRATE 40 MG TABS    Take 1 tablet (40 mg total) by mouth daily.   FLUOXETINE (PROZAC) 20 MG CAPSULE    Take 2 capsules (40 mg total) by mouth daily.   FUROSEMIDE (LASIX) 20 MG TABLET    Take 20 mg by mouth daily.   GABAPENTIN (NEURONTIN) 300 MG CAPSULE    Take 300 mg by mouth at bedtime.   LACTULOSE (CHRONULAC) 10 GM/15ML SOLUTION    Take 10 g by mouth daily. constipation   LEVOTHYROXINE (SYNTHROID, LEVOTHROID) 50 MCG TABLET    Take 1 tablet (50 mcg total) by mouth daily.  LINAGLIPTIN (TRADJENTA) 5 MG TABS TABLET    Take 1 tablet (5 mg total) by mouth daily.   LISINOPRIL (PRINIVIL,ZESTRIL) 2.5 MG TABLET    Take 2.5 mg by mouth daily.   MELATONIN 3 MG TABS    Take 3 mg by mouth at bedtime.   METHOCARBAMOL (ROBAXIN) 500 MG TABLET    Take 500 mg by mouth every 8 (eight) hours as needed for muscle spasms.   OXYCODONE (OXY-IR) 5 MG CAPSULE    Take one tablet by mouth every 4 hours as needed for pain   PANTOPRAZOLE (PROTONIX) 40 MG TABLET    Take 1 tablet (40 mg total) by mouth daily.   POLYETHYLENE GLYCOL (MIRALAX / GLYCOLAX) PACKET    Take 17 g by mouth daily.   SODIUM CHLORIDE (OCEAN) 0.65 % SOLN NASAL SPRAY    Place 1 spray into both nostrils as needed for congestion.   TIZANIDINE (ZANAFLEX) 4 MG TABLET    Take 4 mg by mouth every 8 (eight) hours as needed for muscle spasms.   TRAMADOL (ULTRAM) 50 MG TABLET    Take 4 tablets (200 mg total) by mouth every 12 (twelve) hours. For pain  Modified Medications   No medications on file  Discontinued Medications   No medications  on file     SIGNIFICANT DIAGNOSTIC EXAMS   12-14-14 ct of head: No evidence of acute intracranial abnormality. Small vessel ischemic changes with intracranial atherosclerosis.  12-14-14: chest x-ray: Elevation left hemidiaphragm. No edema or consolidation. Mild cardiac enlargement.  12-15-14: ct of abdomen and pelvis: No evidence of abdominal, pelvic, or retroperitoneal hematoma. Prominent stool-filled rectosigmoid colon with gaseous distention of the remainder of the colon suggesting obstipation with pseudo-obstruction. Mass versus prominent vascularity in the subcarinal region.   12-15-14: renal ultrasound: Study is moderately limited by patient body habitus but appears grossly normal.   12-15-14: ct of chest: 1. While the mediastinal structures are difficult to evaluate without IV contrast, there does not appear to be a subcarinal mass and the findings on comparison CT likely represent the left atrium. 2. Left basilar atelectasis.  12-15-14: EEG: This EEG is abnormal with mild localized nonspecific continuous slowing of cerebral activity. No evidence of an epileptic disorder was demonstrated. Lack of epileptiform activity during EEG recording does not rule out seizure disorder, however  04-29-15: right lower extremity doppler: neg for dvt  06-01-15: No active cardiopulmonary disease.Right-sided PICC line with the tip projecting over the SVC     LABS REVIEWED:   12-14-14: wbc 1.4; hgb 7.6; hgb 22.8; mcv 93.1 plt 356; glucose 79; bun 89; creat 4.15; k+4.4 ;na++133; liver normal albumin 3.5; tsh 4.786; vit b12: 1064; folate 15.5 12-14-14: (ED)wbc 9.1; hgb 7.5; hct 22.2; mcv 91.5; plt 339; glucose 95; bun 97; creat 4.74; k+4.5 na++133; liver normal albumin 3.4; tsh 3.898; ammonia 38 12-15-14: wbc 9.8; hgb 7.5; hct 23.0; mcv 90.6; plt 299; glucose 81; bun 95; creat 4.81; k+4.2; na++133; liver normal albumin 3.0; uric acid 53; LDH 124; iron 38; tibc 154 PTH 93 12-16-14: urine culture no growth   12-17-14: wbc 7.0; hgb 8.9; hct 26.8; mcv 89.9; plt 309 12-19-14: glucose 109; bun 26; creat 1.71; k+4.3; na++139 12-23-14: glucose 102; bun 21; creat 1.53; k+4.7; na++138; tsh 4.567 01-21-15: wbc 8.0; hgb 9.6; hct 30.3; mcv 95.9; plt 296  03-11-15: tsh 3.818 04-01-15: glucose 98; bun 22; creat 1.70; k+4.8; na++138  05-02-15: hgb a1c 6.2 06-01-15: wbc 6.5 ;hgb 9.7 hct 30.0; mcv 94.3; plt 203;  glucose 102; bun 15; creat 1.62; k+ 4.6 na++131; liver normal albumin 3.0; blood culture: no growth 06-02-15: hgb a1c 6.3; tsh 2.979; mag 1.6; phos 3.2; urine culture: no growth 06-03-15: wbc 5.0 hgb 9.5; hct 28.8; mcv 96.0; plt 192; glucose 130; bun 13; creat 1.55; k+4.3; na++134 06-10-15: wbc 5.6 hgb 8.1; hct 24.7; mcv 91.5; plt 181; glucose 108; bun 17; creat 1.50; k+ 4.0; na++139  06-20-15: glucose 21; bun 21; creat 1.95; k+ 4.8; na++135; mag 1.9  10-10-15: wbc 7.7; hgb 11.2; hct 33.6; mcv 93.6; plt 283; glucose 132; bun 38; creat 2.30; k+ 4.4; na++137; liver normal albumin 3.6; chol 149; ldl 74; trig 222; hdl 31; hgb a1c 6.2; tsh 10.013     Review of Systems Constitutional: Negative for appetite change and fatigue.  HENT: Negative for congestion.   Respiratory: Negative for cough, chest tightness and shortness of breath.   Cardiovascular: Negative for chest pain and palpitations. No edema  Gastrointestinal: Negative for nausea, abdominal pain, diarrhea and constipation.  Musculoskeletal: no complaints   Skin: Negative for pallor.  Neurological: Negative for dizziness.  Psychiatric/Behavioral: The patient is not nervous/anxious.     Physical Exam Constitutional: She is oriented to person, place, and time. No distress.  Morbidly obese   Eyes: Conjunctivae are normal.  Neck: Neck supple. No JVD present. No thyromegaly present.  Cardiovascular: Normal rate, regular rhythm and intact distal pulses.   Respiratory: Effort normal and breath sounds normal. No respiratory distress. She has no wheezes.  GI: Soft.  Bowel sounds are normal. She exhibits no distension. There is no tenderness.  Musculoskeletal: She exhibits 2+ lower extremity edema   Able to move all extremities  Has limited range of motion due to obesity   Lymphadenopathy:    She has no cervical adenopathy.  Neurological: She is alert and oriented to person, place, and time.  Skin: Skin is warm and dry. She is not diaphoretic.  Psychiatric: She has a normal mood and affect.      ASSESSMENT/ PLAN:  1. Gout: no recent flares will continue allopurinol 200 mg daily will monitor  2. DVT: she has completed her xarelto therapy;  will monitor   3. Chronic pain: her pain is being managed with her current regimen will continue ultram 200 mg twice daily; elavil 100 mg nightly; zanaflex 4 mg every 8 hours as needed robaxin 500 mg every 8 hours as needed;    4. Gerd: will continue protonix 40 mg daily  5. Constipation: colace twice daily; lactulose 10 gm daily miralax daily   6. Depression: she is emotionally stable; will continue prozac 40 mg daily and takes melatonin 3 mg nightly for sleep.   7. Diabetes: will continue tradjenta 5 mg daily her hgb a1c is 6.2   8. Hypertension:  Will continue lisinopril 2.5 mg daily;   9. Hypothyroidism: will continue  synthroid 50 mcg daily   tsh is 10.013 her does has been adjusted.   10. Lower extremity edema: will continue lasix  40 mg daily   11. Dyslipidemia: will continue fenofibrate 40 mg daily her trig are [place] NP [facility]  Contact [phone] Monday through Friday 8am- 5pm  After hours call [phone]

## 2015-12-20 ENCOUNTER — Non-Acute Institutional Stay (SKILLED_NURSING_FACILITY): Payer: Medicare Other | Admitting: Internal Medicine

## 2015-12-20 ENCOUNTER — Encounter: Payer: Self-pay | Admitting: Internal Medicine

## 2015-12-20 DIAGNOSIS — I10 Essential (primary) hypertension: Secondary | ICD-10-CM

## 2015-12-20 DIAGNOSIS — F329 Major depressive disorder, single episode, unspecified: Secondary | ICD-10-CM

## 2015-12-20 DIAGNOSIS — E781 Pure hyperglyceridemia: Secondary | ICD-10-CM | POA: Diagnosis not present

## 2015-12-20 DIAGNOSIS — E034 Atrophy of thyroid (acquired): Secondary | ICD-10-CM | POA: Diagnosis not present

## 2015-12-20 DIAGNOSIS — R609 Edema, unspecified: Secondary | ICD-10-CM

## 2015-12-20 DIAGNOSIS — F32A Depression, unspecified: Secondary | ICD-10-CM

## 2015-12-20 DIAGNOSIS — G8929 Other chronic pain: Secondary | ICD-10-CM

## 2015-12-20 DIAGNOSIS — E038 Other specified hypothyroidism: Secondary | ICD-10-CM | POA: Diagnosis not present

## 2015-12-20 DIAGNOSIS — E1143 Type 2 diabetes mellitus with diabetic autonomic (poly)neuropathy: Secondary | ICD-10-CM | POA: Diagnosis not present

## 2015-12-20 NOTE — Progress Notes (Signed)
Patient ID: Lauren Barber, female   DOB: May 01, 1956, 60 y.o.   MRN: [id number]    DATE:12/20/15  Location:  [place] of Service: SNF [phone])   Extended Emergency Contact Information Primary Emergency Contact: Hill,[name] Address: [address], [place] [address] [place] Phone: [phone] Relation: Mother  Advanced Directive information   FULL CODE  Chief Complaint  Patient presents with  . Medical Management of Chronic Issues    HPI:  60 yo female long term resident seen today for f/u. She c/o uncontrolled neuropathy. She is taking gabapentin 38m BID. No relief with ultram. She has numbness and cold sensation in feet. Edema is slowly improving. Skin is dry despite liberal use of moisturizer  Gout - stable. no recent flares on allopurinol  Hx DVT - completed  xarelto therapy  Chronic pain - stable on  ultram 200 mg twice daily; elavil 100 mg nightly; zanaflex 4 mg every 8 hours as needed robaxin 500 mg every 8 hours as needed  GERD - stable on protonix  Constipation - stable on colace, lactulose and miralax daily   Depression - mood stable on prozac and takes melatonin nightly for sleep.   DM - controlled on tradjenta. Last  a1c 6.2   Hypertension -BP controlled on lisinopril  Hypothyroidism - suboptimally controlled with TSH 10.013 last month. Synthroid dose was adjusted to 558m daily.   Lower extremity edema - stable on lasix  Dyslipidemia - stable on fenofibrate. TG 222. LDL 74      Past Medical History  Diagnosis Date  . Hypertension   . Obesities, morbid ([facility]  . Vaginal bleeding   . Endometriosis   . Atopic conjunctivitis   . Diabetes mellitus without complication ([facility]  . Cellulitis     legs  . Insomnia   . Cardiomegaly   . Gout     feet  . Anemia   . [facility]  . CVA (cerebral vascular accident) ([facility]  . Anxiety   . Headache(784.0)     otc meds prn  .  Degenerative, intervertebral disc     back, legs  . History of blood transfusion [place] unsure number of units transfused  . Neuromuscular disorder ([facility]    diabetic neuropathy - feet  . Depressive disorder     depressive disorder    Past Surgical History  Procedure Laterality Date  . Cesarean section  1982, 1989    x 2  . Biopsy endometrial N/A 10 03 2013  . Dilation and curettage of uterus  2013  . Tonsillectomy    . Wisdom tooth extraction    . Hysteroscopy w/d&c N/A 06/28/2014    Procedure: DILATATION AND CURETTAGE /HYSTEROSCOPY;  Surgeon: CaLavonia DraftsMD;  [facility];  Service: Gynecology;  Laterality: N/A;    Patient Care Team: MoGildardo CrankerDO as PCP - General (Internal Medicine) DeGerlene FeeNP as Nurse Practitioner (Nurse Practitioner) [facility] Social History   Social History  . Marital Status: Single    Spouse Name: N/A  . Number of Children: N/A  . Years of Education: N/A   Occupational History  . Not on file.   Social History Main Topics  . Smoking status: Former Smoker -- 0.50 packs/day for 43 years    Types: Cigarettes  Quit date: 06/11/2012  . Smokeless tobacco: Never Used  . Alcohol Use: No  . Drug Use: No  . Sexual Activity: No   Other Topics Concern  . Not on file   Social History Narrative     reports that she quit smoking about 3 years ago. Her smoking use included Cigarettes. She has a 21.5 pack-year smoking history. She has never used smokeless tobacco. She reports that she does not drink alcohol or use illicit drugs.  Immunization History  Administered Date(s) Administered  . Influenza Whole 09/09/2013  . Influenza-Unspecified 09/01/2014, 08/12/2015  . Pneumococcal Polysaccharide-23 12/16/2014  . Td 07/22/2006    No Known Allergies  Medications: Patient's Medications  New Prescriptions   No medications on file  Previous Medications    ACETAMINOPHEN (TYLENOL) 325 MG TABLET    Take 650 mg by mouth every 6 (six) hours as needed for moderate pain, fever or headache.    ALLOPURINOL (ZYLOPRIM) 100 MG TABLET    Take 200 mg by mouth daily.   AMITRIPTYLINE (ELAVIL) 100 MG TABLET    Take 100 mg by mouth at bedtime.   DIPHENHYDRAMINE (BENADRYL) 25 MG TABLET    Take 25 mg by mouth every 8 (eight) hours as needed for itching. For itching   DOCUSATE SODIUM (COLACE) 100 MG CAPSULE    Take 1 capsule (100 mg total) by mouth 2 (two) times daily.   EPINEPHRINE (EPIPEN) 0.3 MG/0.3 ML DEVI    Inject 0.3 mg into the muscle once.   FENOFIBRATE 40 MG TABS    Take 1 tablet (40 mg total) by mouth daily.   FLUOXETINE (PROZAC) 20 MG CAPSULE    Take 2 capsules (40 mg total) by mouth daily.   FUROSEMIDE (LASIX) 20 MG TABLET    Take 20 mg by mouth daily.   GABAPENTIN (NEURONTIN) 300 MG CAPSULE    Take 300 mg by mouth at bedtime.   LACTULOSE (CHRONULAC) 10 GM/15ML SOLUTION    Take 10 g by mouth daily. constipation   LEVOTHYROXINE (SYNTHROID, LEVOTHROID) 50 MCG TABLET    Take 1 tablet (50 mcg total) by mouth daily.   LINAGLIPTIN (TRADJENTA) 5 MG TABS TABLET    Take 1 tablet (5 mg total) by mouth daily.   LISINOPRIL (PRINIVIL,ZESTRIL) 2.5 MG TABLET    Take 2.5 mg by mouth daily.   MELATONIN 3 MG TABS    Take 3 mg by mouth at bedtime.   METHOCARBAMOL (ROBAXIN) 500 MG TABLET    Take 500 mg by mouth every 8 (eight) hours as needed for muscle spasms.   OXYCODONE (OXY-IR) 5 MG CAPSULE    Take one tablet by mouth every 4 hours as needed for pain   PANTOPRAZOLE (PROTONIX) 40 MG TABLET    Take 1 tablet (40 mg total) by mouth daily.   POLYETHYLENE GLYCOL (MIRALAX / GLYCOLAX) PACKET    Take 17 g by mouth daily.   SODIUM CHLORIDE (OCEAN) 0.65 % SOLN NASAL SPRAY    Place 1 spray into both nostrils as needed for congestion.   TIZANIDINE (ZANAFLEX) 4 MG TABLET    Take 4 mg by mouth every 8 (eight) hours as needed for muscle spasms.   TRAMADOL (ULTRAM) 50 MG TABLET    Take  4 tablets (200 mg total) by mouth every 12 (twelve) hours. For pain  Modified Medications   No medications on file  Discontinued Medications   No medications on file    Review of Systems  Respiratory: Positive for wheezing.  Cardiovascular: Positive for leg swelling.  Musculoskeletal: Positive for arthralgias and gait problem.  Neurological: Positive for numbness.  All other systems reviewed and are negative.   Filed Vitals:   12/20/15 1451  BP: 110/50  Pulse: 85  Temp: 97.7 F (36.5 C)  Weight: 341 lb (154.677 kg)  SpO2: 96%   Body mass index is 50.33 kg/(m^2).  Physical Exam  Constitutional: She is oriented to person, place, and time. She appears well-developed and well-nourished.  Sitting up in bed in NAD  HENT:  Mouth/Throat: Oropharynx is clear and moist. No oropharyngeal exudate.  Eyes: Pupils are equal, round, and reactive to light. No scleral icterus.  Neck: Neck supple. Carotid bruit is not present. No tracheal deviation present. No thyromegaly present.  Cardiovascular: Normal rate, regular rhythm and intact distal pulses.  Exam reveals no gallop and no friction rub.   Murmur (1/6 SEM) heard. R>LLE +1 pitting edema. No calf TTP.   Pulmonary/Chest: Effort normal and breath sounds normal. No stridor. No respiratory distress. She has no wheezes. She has no rales.  Abdominal: Soft. Bowel sounds are normal. She exhibits no distension and no mass. There is no hepatomegaly. There is no tenderness. There is no rebound and no guarding.  obese  Musculoskeletal: She exhibits edema and tenderness.  Lymphadenopathy:    She has no cervical adenopathy.  Neurological: She is alert and oriented to person, place, and time. She has normal reflexes.  Skin: Skin is warm and dry. No rash noted.  Psychiatric: She has a normal mood and affect. Her behavior is normal. Thought content normal.     Labs reviewed: No visits with results within 3 Month(s) from this visit. Latest known  visit with results is:  Admission on 06/01/2015, Discharged on 06/03/2015  Component Date Value Ref Range Status  . Sodium 06/01/2015 131* 135 - 145 mmol/L Final  . Potassium 06/01/2015 4.6  3.5 - 5.1 mmol/L Final  . Chloride 06/01/2015 99* 101 - 111 mmol/L Final  . CO2 06/01/2015 25  22 - 32 mmol/L Final  . Glucose, Bld 06/01/2015 102* 65 - 99 mg/dL Final  . BUN 06/01/2015 15  6 - 20 mg/dL Final  . Creatinine, Ser 06/01/2015 1.62* 0.44 - 1.00 mg/dL Final  . Calcium 06/01/2015 8.8* 8.9 - 10.3 mg/dL Final  . Total Protein 06/01/2015 7.8  6.5 - 8.1 g/dL Final  . Albumin 06/01/2015 3.0* 3.5 - 5.0 g/dL Final  . AST 06/01/2015 22  15 - 41 U/L Final  . ALT 06/01/2015 19  14 - 54 U/L Final  . Alkaline Phosphatase 06/01/2015 97  38 - 126 U/L Final  . Total Bilirubin 06/01/2015 0.5  0.3 - 1.2 mg/dL Final  . GFR calc non Af Amer 06/01/2015 34* >60 mL/min Final  . GFR calc Af Amer 06/01/2015 40* >60 mL/min Final   Comment: (NOTE) The eGFR has been calculated using the CKD EPI equation. This calculation has not been validated in all clinical situations. eGFR's persistently <60 mL/min signify possible Chronic Kidney Disease.   . Anion gap 06/01/2015 7  5 - 15 Final  . Specimen Description 06/01/2015 BLOOD PICC LINE   Final  . Special Requests 06/01/2015 BOTTLES DRAWN AEROBIC AND ANAEROBIC 8CC   Final  . Culture 06/01/2015 NO GROWTH 5 DAYS   Final  . Report Status 06/01/2015 06/06/2015 FINAL   Final  . Specimen Description 06/01/2015 BLOOD LEFT HAND   Final  . Special Requests 06/01/2015 BOTTLES DRAWN AEROBIC AND ANAEROBIC 5CC  Final  . Culture 06/01/2015 NO GROWTH 5 DAYS   Final  . Report Status 06/01/2015 06/06/2015 FINAL   Final  . Specimen Description 06/02/2015 URINE, CLEAN CATCH   Final  . Special Requests 06/02/2015 NONE   Final  . Culture 06/02/2015 3,000 COLONIES/mL INSIGNIFICANT GROWTH   Final  . Report Status 06/02/2015 06/04/2015 FINAL   Final  . Lactic Acid, Venous 06/01/2015  0.92  0.5 - 2.0 mmol/L Final  . WBC 06/01/2015 6.5  4.0 - 10.5 K/uL Final  . RBC 06/01/2015 3.18* 3.87 - 5.11 MIL/uL Final  . Hemoglobin 06/01/2015 9.7* 12.0 - 15.0 g/dL Final  . HCT 06/01/2015 30.0* 36.0 - 46.0 % Final  . MCV 06/01/2015 94.3  78.0 - 100.0 fL Final  . MCH 06/01/2015 30.5  26.0 - 34.0 pg Final  . MCHC 06/01/2015 32.3  30.0 - 36.0 g/dL Final  . RDW 06/01/2015 14.2  11.5 - 15.5 % Final  . Platelets 06/01/2015 203  150 - 400 K/uL Final  . Neutrophils Relative % 06/01/2015 81* 43 - 77 % Final  . Neutro Abs 06/01/2015 5.3  1.7 - 7.7 K/uL Final  . Lymphocytes Relative 06/01/2015 14  12 - 46 % Final  . Lymphs Abs 06/01/2015 0.9  0.7 - 4.0 K/uL Final  . Monocytes Relative 06/01/2015 3  3 - 12 % Final  . Monocytes Absolute 06/01/2015 0.2  0.1 - 1.0 K/uL Final  . Eosinophils Relative 06/01/2015 2  0 - 5 % Final  . Eosinophils Absolute 06/01/2015 0.2  0.0 - 0.7 K/uL Final  . Basophils Relative 06/01/2015 0  0 - 1 % Final  . Basophils Absolute 06/01/2015 0.0  0.0 - 0.1 K/uL Final  . Glucose-Capillary 06/01/2015 97  65 - 99 mg/dL Final  . Lactic Acid, Venous 06/02/2015 0.87  0.5 - 2.0 mmol/L Final  . Hgb A1c MFr Bld 06/02/2015 6.3* 4.8 - 5.6 % Final   Comment: (NOTE)         Pre-diabetes: 5.7 - 6.4         Diabetes: >6.4         Glycemic control for adults with diabetes: <7.0   . Mean Plasma Glucose 06/02/2015 134   Final   Comment: (NOTE) Performed At: [facility] [place], [place] [id number] Lindon Romp MD LE:[id number]   . Magnesium 06/02/2015 1.6* 1.7 - 2.4 mg/dL Final  . Phosphorus 06/02/2015 3.2  2.5 - 4.6 mg/dL Final  . TSH 06/02/2015 2.979  0.350 - 4.500 uIU/mL Final  . Sodium 06/02/2015 132* 135 - 145 mmol/L Final  . Potassium 06/02/2015 4.1  3.5 - 5.1 mmol/L Final  . Chloride 06/02/2015 101  101 - 111 mmol/L Final  . CO2 06/02/2015 23  22 - 32 mmol/L Final  . Glucose, Bld 06/02/2015 130* 65 - 99 mg/dL Final  . BUN 06/02/2015 15  6 -  20 mg/dL Final  . Creatinine, Ser 06/02/2015 1.62* 0.44 - 1.00 mg/dL Final  . Calcium 06/02/2015 8.5* 8.9 - 10.3 mg/dL Final  . Total Protein 06/02/2015 7.1  6.5 - 8.1 g/dL Final  . Albumin 06/02/2015 2.8* 3.5 - 5.0 g/dL Final  . AST 06/02/2015 23  15 - 41 U/L Final  . ALT 06/02/2015 19  14 - 54 U/L Final  . Alkaline Phosphatase 06/02/2015 99  38 - 126 U/L Final  . Total Bilirubin 06/02/2015 0.5  0.3 - 1.2 mg/dL Final  . GFR calc non Af Amer 06/02/2015 34* >60  mL/min Final  . GFR calc Af Amer 06/02/2015 40* >60 mL/min Final   Comment: (NOTE) The eGFR has been calculated using the CKD EPI equation. This calculation has not been validated in all clinical situations. eGFR's persistently <60 mL/min signify possible Chronic Kidney Disease.   . Anion gap 06/02/2015 8  5 - 15 Final  . WBC 06/02/2015 5.3  4.0 - 10.5 K/uL Final  . RBC 06/02/2015 3.21* 3.87 - 5.11 MIL/uL Final  . Hemoglobin 06/02/2015 9.8* 12.0 - 15.0 g/dL Final  . HCT 06/02/2015 30.4* 36.0 - 46.0 % Final  . MCV 06/02/2015 94.7  78.0 - 100.0 fL Final  . MCH 06/02/2015 30.5  26.0 - 34.0 pg Final  . MCHC 06/02/2015 32.2  30.0 - 36.0 g/dL Final  . RDW 06/02/2015 14.3  11.5 - 15.5 % Final  . Platelets 06/02/2015 189  150 - 400 K/uL Final  . Osmolality, Ur 06/02/2015 473  390 - 1090 mOsm/kg Final   Performed at [facility]  . MRSA by PCR 06/02/2015 POSITIVE* NEGATIVE Final   Comment:        The GeneXpert MRSA Assay (FDA approved for NASAL specimens only), is one component of a comprehensive MRSA colonization surveillance program. It is not intended to diagnose MRSA infection nor to guide or monitor treatment for MRSA infections. RESULT CALLED TO, READ BACK BY AND VERIFIED WITH: CARSON,J RN [phone] AT 0503 SKEEN,P   . Glucose-Capillary 06/02/2015 133* 65 - 99 mg/dL Final  . Glucose-Capillary 06/02/2015 127* 65 - 99 mg/dL Final  . WBC 06/03/2015 5.0  4.0 - 10.5 K/uL Final  . RBC 06/03/2015 3.00* 3.87 - 5.11  MIL/uL Final  . Hemoglobin 06/03/2015 9.5* 12.0 - 15.0 g/dL Final  . HCT 06/03/2015 28.8* 36.0 - 46.0 % Final  . MCV 06/03/2015 96.0  78.0 - 100.0 fL Final  . MCH 06/03/2015 31.7  26.0 - 34.0 pg Final  . MCHC 06/03/2015 33.0  30.0 - 36.0 g/dL Final  . RDW 06/03/2015 14.4  11.5 - 15.5 % Final  . Platelets 06/03/2015 192  150 - 400 K/uL Final  . Sodium 06/03/2015 134* 135 - 145 mmol/L Final  . Potassium 06/03/2015 4.3  3.5 - 5.1 mmol/L Final  . Chloride 06/03/2015 101  101 - 111 mmol/L Final  . CO2 06/03/2015 24  22 - 32 mmol/L Final  . Glucose, Bld 06/03/2015 130* 65 - 99 mg/dL Final  . BUN 06/03/2015 13  6 - 20 mg/dL Final  . Creatinine, Ser 06/03/2015 1.55* 0.44 - 1.00 mg/dL Final  . Calcium 06/03/2015 8.7* 8.9 - 10.3 mg/dL Final  . GFR calc non Af Amer 06/03/2015 36* >60 mL/min Final  . GFR calc Af Amer 06/03/2015 42* >60 mL/min Final   Comment: (NOTE) The eGFR has been calculated using the CKD EPI equation. This calculation has not been validated in all clinical situations. eGFR's persistently <60 mL/min signify possible Chronic Kidney Disease.   . Anion gap 06/03/2015 9  5 - 15 Final  . Glucose-Capillary 06/02/2015 101* 65 - 99 mg/dL Final  . Glucose-Capillary 06/02/2015 116* 65 - 99 mg/dL Final  . Comment 1 06/02/2015 Notify RN   Final  . Glucose-Capillary 06/03/2015 115* 65 - 99 mg/dL Final  . Glucose-Capillary 06/03/2015 119* 65 - 99 mg/dL Final    No results found.   Assessment/Plan   ICD-9-CM ICD-10-CM   1. Diabetic autonomic neuropathy associated with type 2 diabetes mellitus (HCC) - neuropathy uncontrolled 250.60 E11.43  337.1    2. Hypothyroidism due to acquired atrophy of thyroid - suboptimally controlled 244.8 E03.8    246.8 E03.4   3. Essential hypertension 401.9 I10   4. Edema, unspecified type 782.3 R60.9   5. Hypertriglyceridemia 272.1 E78.1   6. Chronic pain 338.29 G89.29   7. Depression 311 F32.9     Increase gabapentin 667m BID  Check TSH and  free T4 tomorrow  Will check BMP, lipid panel and A1c in 3 weeks  Cont other meds as ordered  PT/OT as indicated  Will follow  Lauren Barber S. CPerlie Gold PThe [facility] Hospital At [facility]and Adult [facility] [address] [phone] (Monday-Friday 8 AM - 5 PM) ([phone] 5 PM and follow prompts

## 2015-12-26 ENCOUNTER — Non-Acute Institutional Stay (SKILLED_NURSING_FACILITY): Payer: Medicare Other | Admitting: Adult Health

## 2015-12-26 DIAGNOSIS — M109 Gout, unspecified: Secondary | ICD-10-CM

## 2015-12-26 DIAGNOSIS — G8929 Other chronic pain: Secondary | ICD-10-CM | POA: Diagnosis not present

## 2015-12-26 DIAGNOSIS — E1165 Type 2 diabetes mellitus with hyperglycemia: Secondary | ICD-10-CM

## 2015-12-26 DIAGNOSIS — IMO0001 Reserved for inherently not codable concepts without codable children: Secondary | ICD-10-CM

## 2015-12-26 DIAGNOSIS — E781 Pure hyperglyceridemia: Secondary | ICD-10-CM

## 2015-12-26 DIAGNOSIS — E038 Other specified hypothyroidism: Secondary | ICD-10-CM | POA: Diagnosis not present

## 2015-12-26 DIAGNOSIS — E034 Atrophy of thyroid (acquired): Secondary | ICD-10-CM

## 2015-12-26 DIAGNOSIS — I10 Essential (primary) hypertension: Secondary | ICD-10-CM

## 2015-12-26 DIAGNOSIS — E1143 Type 2 diabetes mellitus with diabetic autonomic (poly)neuropathy: Secondary | ICD-10-CM | POA: Diagnosis not present

## 2015-12-30 LAB — LIPID PANEL
Cholesterol: 80 mg/dL (ref 0–200)
HDL: 23 mg/dL — AB (ref 35–70)
LDL Cholesterol: 24 mg/dL
TRIGLYCERIDES: 164 mg/dL — AB (ref 40–160)

## 2015-12-30 LAB — HEMOGLOBIN A1C: Hemoglobin A1C: 6.5

## 2016-01-11 LAB — LIPID PANEL
CHOLESTEROL: 85 mg/dL (ref 0–200)
HDL: 28 mg/dL — AB (ref 35–70)
LDL CALC: 27 mg/dL
TRIGLYCERIDES: 150 mg/dL (ref 40–160)

## 2016-01-19 ENCOUNTER — Encounter: Payer: Self-pay | Admitting: Adult Health

## 2016-01-19 ENCOUNTER — Non-Acute Institutional Stay (SKILLED_NURSING_FACILITY): Payer: Medicare Other | Admitting: Adult Health

## 2016-01-19 DIAGNOSIS — M109 Gout, unspecified: Secondary | ICD-10-CM

## 2016-01-19 DIAGNOSIS — G8929 Other chronic pain: Secondary | ICD-10-CM

## 2016-01-19 DIAGNOSIS — I10 Essential (primary) hypertension: Secondary | ICD-10-CM | POA: Diagnosis not present

## 2016-01-19 DIAGNOSIS — E781 Pure hyperglyceridemia: Secondary | ICD-10-CM | POA: Diagnosis not present

## 2016-01-19 DIAGNOSIS — E034 Atrophy of thyroid (acquired): Secondary | ICD-10-CM | POA: Diagnosis not present

## 2016-01-19 DIAGNOSIS — IMO0001 Reserved for inherently not codable concepts without codable children: Secondary | ICD-10-CM

## 2016-01-19 DIAGNOSIS — E1165 Type 2 diabetes mellitus with hyperglycemia: Secondary | ICD-10-CM | POA: Diagnosis not present

## 2016-01-19 DIAGNOSIS — E038 Other specified hypothyroidism: Secondary | ICD-10-CM | POA: Diagnosis not present

## 2016-01-19 DIAGNOSIS — E1143 Type 2 diabetes mellitus with diabetic autonomic (poly)neuropathy: Secondary | ICD-10-CM

## 2016-01-19 LAB — URIC ACID: Uric Acid: 4.3

## 2016-01-19 LAB — VITAMIN D 1,25 DIHYDROXY: Vit D, 25-Hydroxy: 18

## 2016-01-19 NOTE — Progress Notes (Signed)
Patient ID: Lauren Barber, female   DOB: 08-22-1956, 60 y.o.   MRN: [id number]   Facility: [facility]       No Known Allergies  Chief Complaint  Patient presents with  . Medical Management of Chronic Issues    Follow up    HPI:  She is a long term resident of this facility being seen for the management of her chronic illnesses. She is complaining of neuropathic pain in bilateral feet. She would like to have her medications adjusted. There are nursing concerns at this time.    Past Medical History  Diagnosis Date  . Hypertension   . Obesities, morbid ([place])   . Vaginal bleeding   . Endometriosis   . Atopic conjunctivitis   . Diabetes mellitus without complication ([place])   . Cellulitis     legs  . Insomnia   . Cardiomegaly   . Gout     feet  . Anemia   . Stroke [facility]) 2013  . CVA (cerebral vascular accident) ([place])   . Anxiety   . Headache(784.0)     otc meds prn  . Degenerative, intervertebral disc     back, legs  . History of blood transfusion [place] - unsure number of units transfused  . Neuromuscular disorder ([place])     diabetic neuropathy - feet  . Depressive disorder     depressive disorder    Past Surgical History  Procedure Laterality Date  . Cesarean section  1982, 1989    x 2  . Biopsy endometrial N/A 10 03 2013  . Dilation and curettage of uterus  2013  . Tonsillectomy    . Wisdom tooth extraction    . Hysteroscopy w/d&c N/A 06/28/2014    Procedure: DILATATION AND CURETTAGE /HYSTEROSCOPY;  Surgeon: Lavonia Drafts, MD;  Location: [facility];  Service: Gynecology;  Laterality: N/A;    VITAL SIGNS BP 121/79 mmHg  Pulse 84  Temp(Src) 97.8 F (36.6 C) (Oral)  Resp 17  Ht 5\' 9"  (1.753 m)  Wt 342 lb 4 oz (155.244 kg)  BMI 50.52 kg/m2  SpO2 95%  Patient's Medications  New Prescriptions   No medications on file  Previous Medications   ACETAMINOPHEN (TYLENOL) 325 MG TABLET    Take 650 mg by mouth every 6 (six) hours as needed for  moderate pain, fever or headache.    ALLOPURINOL (ZYLOPRIM) 100 MG TABLET    Take 200 mg by mouth daily.   AMITRIPTYLINE (ELAVIL) 100 MG TABLET    Take 100 mg by mouth at bedtime.   ATORVASTATIN (LIPITOR) 40 MG TABLET    Take 40 mg by mouth daily.   DOCUSATE SODIUM (COLACE) 100 MG CAPSULE    Take 1 capsule (100 mg total) by mouth 2 (two) times daily.   EPINEPHRINE (EPIPEN) 0.3 MG/0.3 ML DEVI    Inject 0.3 mg into the muscle once.   FLUOXETINE (PROZAC) 40 MG CAPSULE    Take 40 mg by mouth daily.   FUROSEMIDE (LASIX) 20 MG TABLET    Take 20 mg by mouth daily.   GABAPENTIN (NEURONTIN) 300 MG CAPSULE    Take 300 mg by mouth every 8 (eight) hours.    HYDROCORTISONE CREAM 1 %    Apply 1 application topically 2 (two) times daily.   LEVOTHYROXINE (SYNTHROID, LEVOTHROID) 50 MCG TABLET    Take 1 tablet (50 mcg total) by mouth daily.   LISINOPRIL (PRINIVIL,ZESTRIL) 2.5 MG TABLET    Take 2.5 mg  by mouth daily.   MELATONIN 3 MG TABS    Take 3 mg by mouth at bedtime.   METHOCARBAMOL (ROBAXIN) 500 MG TABLET    Take 500 mg by mouth every 8 (eight) hours as needed for muscle spasms.   MUPIROCIN OINTMENT (BACTROBAN) 2 %    Apply 1 application topically. Apply to Right toe until 01/22/16   PANTOPRAZOLE (PROTONIX) 40 MG TABLET    Take 1 tablet (40 mg total) by mouth daily.   POLYETHYLENE GLYCOL (MIRALAX / GLYCOLAX) PACKET    Take 17 g by mouth daily.   SODIUM CHLORIDE (OCEAN) 0.65 % SOLN NASAL SPRAY    Place 1 spray into both nostrils as needed for congestion.   TIZANIDINE (ZANAFLEX) 4 MG TABLET    Take 4 mg by mouth every 8 (eight) hours as needed for muscle spasms.   TRAMADOL (ULTRAM) 50 MG TABLET    Take 4 tablets (200 mg total) by mouth every 12 (twelve) hours. For pain   VITAMIN D, ERGOCALCIFEROL, (DRISDOL) 50000 UNITS CAPS CAPSULE    Take 50,000 Units by mouth every 7 (seven) days.  Modified Medications   No medications on file  Discontinued Medications     SIGNIFICANT DIAGNOSTIC EXAMS  12-14-14 ct of  head: No evidence of acute intracranial abnormality. Small vessel ischemic changes with intracranial atherosclerosis.  12-14-14: chest x-ray: Elevation left hemidiaphragm. No edema or consolidation. Mild cardiac enlargement.  12-15-14: ct of abdomen and pelvis: No evidence of abdominal, pelvic, or retroperitoneal hematoma. Prominent stool-filled rectosigmoid colon with gaseous distention of the remainder of the colon suggesting obstipation with pseudo-obstruction. Mass versus prominent vascularity in the subcarinal region.   12-15-14: renal ultrasound: Study is moderately limited by patient body habitus but appears grossly normal.   12-15-14: ct of chest: 1. While the mediastinal structures are difficult to evaluate without IV contrast, there does not appear to be a subcarinal mass and the findings on comparison CT likely represent the left atrium. 2. Left basilar atelectasis.  12-15-14: EEG: This EEG is abnormal with mild localized nonspecific continuous slowing of cerebral activity. No evidence of an epileptic disorder was demonstrated. Lack of epileptiform activity during EEG recording does not rule out seizure disorder, however  04-29-15: right lower extremity doppler: neg for dvt  06-01-15: No active cardiopulmonary disease.Right-sided PICC line with the tip projecting over the SVC     LABS REVIEWED:   01-21-15: wbc 8.0; hgb 9.6; hct 30.3; mcv 95.9; plt 296  03-11-15: tsh 3.818 04-01-15: glucose 98; bun 22; creat 1.70; k+4.8; na++138  05-02-15: hgb a1c 6.2 06-01-15: wbc 6.5 ;hgb 9.7 hct 30.0; mcv 94.3; plt 203; glucose 102; bun 15; creat 1.62; k+ 4.6 na++131; liver normal albumin 3.0; blood culture: no growth 06-02-15: hgb a1c 6.3; tsh 2.979; mag 1.6; phos 3.2; urine culture: no growth 06-03-15: wbc 5.0 hgb 9.5; hct 28.8; mcv 96.0; plt 192; glucose 130; bun 13; creat 1.55; k+4.3; na++134 06-10-15: wbc 5.6 hgb 8.1; hct 24.7; mcv 91.5; plt 181; glucose 108; bun 17; creat 1.50; k+ 4.0; na++139    06-20-15: glucose 21; bun 21; creat 1.95; k+ 4.8; na++135; mag 1.9  10-10-15: wbc 7.7; hgb 11.2; hct 33.6; mcv 93.6; plt 283; glucose 132; bun 38; creat 2.30; k+ 4.4; na++137; liver normal albumin 3.6; chol 149; ldl 74; trig 222; hdl 31; hgb a1c 6.2; tsh 10.013  12-12-15: chol 90; ldl 34; trig 121; hdl 32 12-26-15: tsh 3.07; vit D 18 12-28-15: uric acid 4.2; tsh 4.75; free  T4: 1.1; hgb a1c 6.3 12-30-15: uric acid 4.3; free T4: 1.0; chol 80; ldl 24; trig 164; hdl 23; hgb a1c 6.5     Review of Systems Constitutional: Negative for appetite change and fatigue.  HENT: Negative for congestion.   Respiratory: Negative for cough, chest tightness and shortness of breath.   Cardiovascular: Negative for chest pain and palpitations. No edema  Gastrointestinal: Negative for nausea, abdominal pain, diarrhea and constipation.  Musculoskeletal: has burning pain in feet   Skin: Negative for pallor.  Neurological: Negative for dizziness.  Psychiatric/Behavioral: The patient is not nervous/anxious.     Physical Exam Constitutional: She is oriented to person, place, and time. No distress.  Morbidly obese   Eyes: Conjunctivae are normal.  Neck: Neck supple. No JVD present. No thyromegaly present.  Cardiovascular: Normal rate, regular rhythm and intact distal pulses.   Respiratory: Effort normal and breath sounds normal. No respiratory distress. She has no wheezes.  GI: Soft. Bowel sounds are normal. She exhibits no distension. There is no tenderness.  Musculoskeletal: She exhibits 2+ lower extremity edema   Able to move all extremities  Has limited range of motion due to obesity   Lymphadenopathy:    She has no cervical adenopathy.  Neurological: She is alert and oriented to person, place, and time.  Skin: Skin is warm and dry. She is not diaphoretic.  Psychiatric: She has a normal mood and affect.      ASSESSMENT/ PLAN:  1. Gout: no recent flares will continue allopurinol 200 mg daily will  monitor  2. DVT: she has completed her xarelto therapy;  will monitor   3. Chronic pain: her pain is being managed with her current regimen will continue ultram 200 mg twice daily; elavil 100 mg nightly; zanaflex 4 mg every 8 hours as needed robaxin 500 mg every 8 hours as needed;   Will increase her neurontin 400 mg three times daily   4. Gerd: will continue protonix 40 mg daily  5. Constipation: colace twice daily; lactulose 10 gm daily miralax daily   6. Depression: she is emotionally stable; will continue prozac 40 mg daily and takes melatonin 3 mg nightly for sleep.   7. Diabetes: will continue tradjenta 5 mg daily her hgb a1c is 6.5   8. Hypertension:  Will continue lisinopril 2.5 mg daily;   9. Hypothyroidism: will continue  synthroid 50 mcg daily   tsh is 3.07 free T4 1.0 .   10. Lower extremity edema: will continue lasix  40 mg daily  11. Dyslipidemia: will continue fenofibrate 40 mg daily ldl 24; trig 164   12. Peripheral neuropathy: will increase her neurontin to 400 mg three times daily              Ok Edwards NP [facility]  Contact [phone] Monday through Friday 8am- 5pm  After hours call [phone]

## 2016-01-19 NOTE — Progress Notes (Signed)
Patient ID: Lauren Barber, female   DOB: 05/04/1956, 60 y.o.   MRN: [id number]   Fac[facility]       No Known Allergies  Chief Complaint  Patient presents with  . Medical Management of Chronic Issues    HPI:  She is a long term resident of this facility being seen for the management of her chronic illnesses. She continues to have pain in her bilateral feet which is burning and achy in nature. She tells me that her current regimen is not providing her with adequate relief.    Past Medical History  Diagnosis Date  . Hypertension   . Obesities, morbid ([place])   . Vaginal bleeding   . Endometriosis   . Atopic conjunctivitis   . Diabetes mellitus without complication ([place])   . Cellulitis     legs  . Insomnia   . Cardiomegaly   . Gout     feet  . Anemia   . Stroke [facility]) 2013  . CVA (cerebral vascular accident) ([place])   . Anxiety   . Headache(784.0)     otc meds prn  . Degenerative, intervertebral disc     back, legs  . History of blood transfusion [place] - unsure number of units transfused  . Neuromuscular disorder ([place])     diabetic neuropathy - feet  . Depressive disorder     depressive disorder    Past Surgical History  Procedure Laterality Date  . Cesarean section  1982, 1989    x 2  . Biopsy endometrial N/A 10 03 2013  . Dilation and curettage of uterus  2013  . Tonsillectomy    . Wisdom tooth extraction    . Hysteroscopy w/d&c N/A 06/28/2014    Procedure: DILATATION AND CURETTAGE /HYSTEROSCOPY;  Surgeon: Lavonia Drafts, MD;  Location: [facility];  Service: Gynecology;  Laterality: N/A;    VITAL SIGNS BP 112/56 mmHg  Pulse 74  Temp(Src) 98.6 F (37 C)  Ht 5\' 9"  (1.753 m)  Wt 341 lb (154.677 kg)  BMI 50.33 kg/m2  Patient's Medications  New Prescriptions   No medications on file  Previous Medications   ACETAMINOPHEN (TYLENOL) 325 MG TABLET    Take 650 mg by mouth every 6 (six) hours as needed for moderate pain, fever or  headache.    ALLOPURINOL (ZYLOPRIM) 100 MG TABLET    Take 200 mg by mouth daily.   AMITRIPTYLINE (ELAVIL) 100 MG TABLET    Take 100 mg by mouth at bedtime.   ATORVASTATIN (LIPITOR) 40 MG TABLET    Take 40 mg by mouth daily.   DOCUSATE SODIUM (COLACE) 100 MG CAPSULE    Take 1 capsule (100 mg total) by mouth 2 (two) times daily.   EPINEPHRINE (EPIPEN) 0.3 MG/0.3 ML DEVI    Inject 0.3 mg into the muscle once.   FLUOXETINE (PROZAC) 40 MG CAPSULE    Take 40 mg by mouth daily.   FUROSEMIDE (LASIX) 20 MG TABLET    Take 20 mg by mouth daily.   GABAPENTIN (NEURONTIN) 300 MG CAPSULE    Take 300 mg by mouth twice daily     HYDROCORTISONE CREAM 1 %    Apply 1 application topically 2 (two) times daily.   LEVOTHYROXINE (SYNTHROID, LEVOTHROID) 50 MCG TABLET    Take 1 tablet (50 mcg total) by mouth daily.   LISINOPRIL (PRINIVIL,ZESTRIL) 2.5 MG TABLET    Take 2.5 mg by mouth daily.   MELATONIN 3 MG TABS  Take 3 mg by mouth at bedtime.   METHOCARBAMOL (ROBAXIN) 500 MG TABLET    Take 500 mg by mouth every 8 (eight) hours as needed for muscle spasms.   MUPIROCIN OINTMENT (BACTROBAN) 2 %    Apply 1 application topically. Apply to Right toe until 01/22/16   PANTOPRAZOLE (PROTONIX) 40 MG TABLET    Take 1 tablet (40 mg total) by mouth daily.   POLYETHYLENE GLYCOL (MIRALAX / GLYCOLAX) PACKET    Take 17 g by mouth daily.   SODIUM CHLORIDE (OCEAN) 0.65 % SOLN NASAL SPRAY    Place 1 spray into both nostrils as needed for congestion.   TIZANIDINE (ZANAFLEX) 4 MG TABLET    Take 4 mg by mouth every 8 (eight) hours as needed for muscle spasms.   TRAMADOL (ULTRAM) 50 MG TABLET    Take 4 tablets (200 mg total) by mouth every 12 (twelve) hours. For pain   VITAMIN D, ERGOCALCIFEROL, (DRISDOL) 50000 UNITS CAPS CAPSULE    Take 50,000 Units by mouth every 7 (seven) days.  Modified Medications   No medications on file  Discontinued Medications   No medications on file     SIGNIFICANT DIAGNOSTIC EXAMS   12-14-14 ct of head: No  evidence of acute intracranial abnormality. Small vessel ischemic changes with intracranial atherosclerosis.  12-14-14: chest x-ray: Elevation left hemidiaphragm. No edema or consolidation. Mild cardiac enlargement.  12-15-14: ct of abdomen and pelvis: No evidence of abdominal, pelvic, or retroperitoneal hematoma. Prominent stool-filled rectosigmoid colon with gaseous distention of the remainder of the colon suggesting obstipation with pseudo-obstruction. Mass versus prominent vascularity in the subcarinal region.   12-15-14: renal ultrasound: Study is moderately limited by patient body habitus but appears grossly normal.   12-15-14: ct of chest: 1. While the mediastinal structures are difficult to evaluate without IV contrast, there does not appear to be a subcarinal mass and the findings on comparison CT likely represent the left atrium. 2. Left basilar atelectasis.  12-15-14: EEG: This EEG is abnormal with mild localized nonspecific continuous slowing of cerebral activity. No evidence of an epileptic disorder was demonstrated. Lack of epileptiform activity during EEG recording does not rule out seizure disorder, however  04-29-15: right lower extremity doppler: neg for dvt  06-01-15: No active cardiopulmonary disease.Right-sided PICC line with the tip projecting over the SVC     LABS REVIEWED:   12-23-14: glucose 102; bun 21; creat 1.53; k+4.7; na++138; tsh 4.567 01-21-15: wbc 8.0; hgb 9.6; hct 30.3; mcv 95.9; plt 296  03-11-15: tsh 3.818 04-01-15: glucose 98; bun 22; creat 1.70; k+4.8; na++138  05-02-15: hgb a1c 6.2 06-01-15: wbc 6.5 ;hgb 9.7 hct 30.0; mcv 94.3; plt 203; glucose 102; bun 15; creat 1.62; k+ 4.6 na++131; liver normal albumin 3.0; blood culture: no growth 06-02-15: hgb a1c 6.3; tsh 2.979; mag 1.6; phos 3.2; urine culture: no growth 06-03-15: wbc 5.0 hgb 9.5; hct 28.8; mcv 96.0; plt 192; glucose 130; bun 13; creat 1.55; k+4.3; na++134 06-10-15: wbc 5.6 hgb 8.1; hct 24.7; mcv 91.5; plt  181; glucose 108; bun 17; creat 1.50; k+ 4.0; na++139  06-20-15: glucose 21; bun 21; creat 1.95; k+ 4.8; na++135; mag 1.9  10-10-15: wbc 7.7; hgb 11.2; hct 33.6; mcv 93.6; plt 283; glucose 132; bun 38; creat 2.30; k+ 4.4; na++137; liver normal albumin 3.6; chol 149; ldl 74; trig 222; hdl 31; hgb a1c 6.2; tsh 10.013     Review of Systems Constitutional: Negative for appetite change and fatigue.  HENT: Negative for congestion.  Respiratory: Negative for cough, chest tightness and shortness of breath.   Cardiovascular: Negative for chest pain and palpitations. No edema  Gastrointestinal: Negative for nausea, abdominal pain, diarrhea and constipation.  Musculoskeletal: has bilateral feet pain  Skin: Negative for pallor.  Neurological: Negative for dizziness.  Psychiatric/Behavioral: The patient is not nervous/anxious.     Physical Exam Constitutional: She is oriented to person, place, and time. No distress.  Morbidly obese   Eyes: Conjunctivae are normal.  Neck: Neck supple. No JVD present. No thyromegaly present.  Cardiovascular: Normal rate, regular rhythm and intact distal pulses.   Respiratory: Effort normal and breath sounds normal. No respiratory distress. She has no wheezes.  GI: Soft. Bowel sounds are normal. She exhibits no distension. There is no tenderness.  Musculoskeletal: She exhibits 2+ lower extremity edema   Able to move all extremities  Has limited range of motion due to obesity   Lymphadenopathy:    She has no cervical adenopathy.  Neurological: She is alert and oriented to person, place, and time.  Skin: Skin is warm and dry. She is not diaphoretic.  Right great toe is warm red and swollen  Psychiatric: She has a normal mood and affect.      ASSESSMENT/ PLAN:  1. Gout: no recent flares will continue allopurinol 200 mg daily will monitor   Will being colchicine 0.6 mg daily for one week.   2. DVT: she has completed her xarelto therapy;  will monitor   3.  Chronic pain:  will continue ultram 200 mg twice daily; elavil 100 mg nightly; zanaflex 4 mg every 8 hours as needed robaxin 500 mg every 8 hours as needed;  Will change her neurontin to 300 mg three times daily   4. Gerd: will continue protonix 40 mg daily  5. Constipation: colace twice daily; lactulose 10 gm daily miralax daily   6. Depression: she is emotionally stable; will continue prozac 40 mg daily and takes melatonin 3 mg nightly for sleep.   7. Diabetes: will continue tradjenta 5 mg daily her hgb a1c is 6.2   8. Hypertension:  Will continue lisinopril 2.5 mg daily;   9. Hypothyroidism: will continue  synthroid 50 mcg daily   tsh is 10.013 her dose has been adjusted.   10. Lower extremity edema: will continue lasix  40 mg daily   11. Dyslipidemia: will continue fenofibrate 40 mg daily her trig are 222    Will check tsh free t4; uric acid; hgb a1c [place] NP [facility]  Contact [phone] Monday through Friday 8am- 5pm  After hours call [phone]

## 2016-02-23 ENCOUNTER — Non-Acute Institutional Stay (SKILLED_NURSING_FACILITY): Payer: Medicare Other | Admitting: Adult Health

## 2016-02-23 DIAGNOSIS — E1143 Type 2 diabetes mellitus with diabetic autonomic (poly)neuropathy: Secondary | ICD-10-CM | POA: Diagnosis not present

## 2016-02-23 DIAGNOSIS — K5901 Slow transit constipation: Secondary | ICD-10-CM | POA: Diagnosis not present

## 2016-02-23 DIAGNOSIS — G8929 Other chronic pain: Secondary | ICD-10-CM | POA: Diagnosis not present

## 2016-02-23 DIAGNOSIS — E034 Atrophy of thyroid (acquired): Secondary | ICD-10-CM | POA: Diagnosis not present

## 2016-02-23 DIAGNOSIS — M109 Gout, unspecified: Secondary | ICD-10-CM

## 2016-02-23 DIAGNOSIS — I10 Essential (primary) hypertension: Secondary | ICD-10-CM

## 2016-02-23 DIAGNOSIS — E038 Other specified hypothyroidism: Secondary | ICD-10-CM | POA: Diagnosis not present

## 2016-02-23 DIAGNOSIS — E1165 Type 2 diabetes mellitus with hyperglycemia: Secondary | ICD-10-CM

## 2016-02-23 DIAGNOSIS — IMO0001 Reserved for inherently not codable concepts without codable children: Secondary | ICD-10-CM

## 2016-02-29 ENCOUNTER — Encounter: Payer: Self-pay | Admitting: Adult Health

## 2016-02-29 NOTE — Progress Notes (Signed)
Patient ID: Lauren Barber, female   DOB: [birth year], 58 y.o.   MRN: [id number]   Facility: [facility]       No Known Allergies  Chief Complaint  Patient presents with  . Medical Management of Chronic Issues    Follow up    HPI:  She is a long term resident of this facility being seen for the management of chronic illnesses. Overall her status remains stable. She is not voicing any complaints or concerns at this time. There are no nursing concerns at this time.   Past Medical History  Diagnosis Date  . Hypertension   . Obesities, morbid ([place])   . Vaginal bleeding   . Endometriosis   . Atopic conjunctivitis   . Diabetes mellitus without complication ([place])   . Cellulitis     legs  . Insomnia   . Cardiomegaly   . Gout     feet  . Anemia   . Stroke [facility]) 2013  . CVA (cerebral vascular accident) ([place])   . Anxiety   . Headache(784.0)     otc meds prn  . Degenerative, intervertebral disc     back, legs  . History of blood transfusion [place] - unsure number of units transfused  . Neuromuscular disorder ([place])     diabetic neuropathy - feet  . Depressive disorder     depressive disorder    Past Surgical History  Procedure Laterality Date  . Cesarean section  1982, 1989    x 2  . Biopsy endometrial N/A 10 03 2013  . Dilation and curettage of uterus  2013  . Tonsillectomy    . Wisdom tooth extraction    . Hysteroscopy w/d&c N/A 06/28/2014    Procedure: DILATATION AND CURETTAGE /HYSTEROSCOPY;  Surgeon: Lavonia Drafts, MD;  Location: [facility];  Service: Gynecology;  Laterality: N/A;    VITAL SIGNS BP 123/73 mmHg  Pulse 100  Temp(Src) 99 F (37.2 C) (Oral)  Ht 5\' 9"  (1.753 m)  Wt 343 lb (155.584 kg)  BMI 50.63 kg/m2  Patient's Medications  New Prescriptions   No medications on file  Previous Medications   ACETAMINOPHEN (TYLENOL) 325 MG TABLET    Take 650 mg by mouth every 6 (six) hours as needed for moderate pain, fever or headache.    ALLOPURINOL (ZYLOPRIM) 100 MG TABLET    Take 200 mg by mouth daily.   AMITRIPTYLINE (ELAVIL) 100 MG TABLET    Take 100 mg by mouth at bedtime.   ATORVASTATIN (LIPITOR) 40 MG TABLET    Take 40 mg by mouth daily. Reported on 02/29/2016   DOCUSATE SODIUM (COLACE) 100 MG CAPSULE    Take 1 capsule (100 mg total) by mouth 2 (two) times daily.   EPINEPHRINE (EPIPEN) 0.3 MG/0.3 ML DEVI    Inject 0.3 mg into the muscle once.   FENOFIBRATE (TRICOR) 48 MG TABLET    Take 48 mg by mouth at bedtime.   FLUOXETINE (PROZAC) 40 MG CAPSULE    Take 40 mg by mouth daily.   FUROSEMIDE (LASIX) 20 MG TABLET    Take 20 mg by mouth daily.   GABAPENTIN (NEURONTIN) 300 MG CAPSULE    Take 300 mg by mouth every 8 (eight) hours.    HYDROCORTISONE CREAM 1 %    Apply 1 application topically 2 (two) times daily.   LEVOTHYROXINE (SYNTHROID, LEVOTHROID) 50 MCG TABLET    Take 1 tablet (50 mcg total) by mouth daily.   LISINOPRIL (PRINIVIL,ZESTRIL)  2.5 MG TABLET    Take 2.5 mg by mouth daily.   MAGNESIUM HYDROXIDE (MILK OF MAGNESIA) 400 MG/5ML SUSPENSION    Take 30 mLs by mouth daily as needed for mild constipation.   MELATONIN 3 MG TABS    Take 3 mg by mouth at bedtime.   METHOCARBAMOL (ROBAXIN) 500 MG TABLET    Take 500 mg by mouth every 8 (eight) hours as needed for muscle spasms.   MUPIROCIN OINTMENT (BACTROBAN) 2 %    Apply 1 application topically. Apply to Right toe until 01/22/16   PANTOPRAZOLE (PROTONIX) 40 MG TABLET    Take 1 tablet (40 mg total) by mouth daily.   POLYETHYLENE GLYCOL (MIRALAX / GLYCOLAX) PACKET    Take 17 g by mouth daily.   SODIUM CHLORIDE (OCEAN) 0.65 % SOLN NASAL SPRAY    Place 1 spray into both nostrils as needed for congestion.   TIZANIDINE (ZANAFLEX) 4 MG TABLET    Take 4 mg by mouth every 8 (eight) hours as needed for muscle spasms.   TRAMADOL (ULTRAM) 50 MG TABLET    Take 4 tablets (200 mg total) by mouth every 12 (twelve) hours. For pain   VITAMIN D, ERGOCALCIFEROL, (DRISDOL) 50000 UNITS CAPS CAPSULE     Take 50,000 Units by mouth every 7 (seven) days.  Modified Medications   No medications on file  Discontinued Medications   No medications on file     SIGNIFICANT DIAGNOSTIC EXAMS  12-14-14 ct of head: No evidence of acute intracranial abnormality. Small vessel ischemic changes with intracranial atherosclerosis.  12-14-14: chest x-ray: Elevation left hemidiaphragm. No edema or consolidation. Mild cardiac enlargement.  12-15-14: ct of abdomen and pelvis: No evidence of abdominal, pelvic, or retroperitoneal hematoma. Prominent stool-filled rectosigmoid colon with gaseous distention of the remainder of the colon suggesting obstipation with pseudo-obstruction. Mass versus prominent vascularity in the subcarinal region.   12-15-14: renal ultrasound: Study is moderately limited by patient body habitus but appears grossly normal.   12-15-14: ct of chest: 1. While the mediastinal structures are difficult to evaluate without IV contrast, there does not appear to be a subcarinal mass and the findings on comparison CT likely represent the left atrium. 2. Left basilar atelectasis.  12-15-14: EEG: This EEG is abnormal with mild localized nonspecific continuous slowing of cerebral activity. No evidence of an epileptic disorder was demonstrated. Lack of epileptiform activity during EEG recording does not rule out seizure disorder, however  04-29-15: right lower extremity doppler: neg for dvt  06-01-15: No active cardiopulmonary disease.Right-sided PICC line with the tip projecting over the SVC     LABS REVIEWED:   03-11-15: tsh 3.818 04-01-15: glucose 98; bun 22; creat 1.70; k+4.8; na++138  05-02-15: hgb a1c 6.2 06-01-15: wbc 6.5 ;hgb 9.7 hct 30.0; mcv 94.3; plt 203; glucose 102; bun 15; creat 1.62; k+ 4.6 na++131; liver normal albumin 3.0; blood culture: no growth 06-02-15: hgb a1c 6.3; tsh 2.979; mag 1.6; phos 3.2; urine culture: no growth 06-03-15: wbc 5.0 hgb 9.5; hct 28.8; mcv 96.0; plt 192; glucose  130; bun 13; creat 1.55; k+4.3; na++134 06-10-15: wbc 5.6 hgb 8.1; hct 24.7; mcv 91.5; plt 181; glucose 108; bun 17; creat 1.50; k+ 4.0; na++139  06-20-15: glucose 21; bun 21; creat 1.95; k+ 4.8; na++135; mag 1.9  10-10-15: wbc 7.7; hgb 11.2; hct 33.6; mcv 93.6; plt 283; glucose 132; bun 38; creat 2.30; k+ 4.4; na++137; liver normal albumin 3.6; chol 149; ldl 74; trig 222; hdl 31; hgb a1c 6.2;  tsh 10.013  12-12-15: chol 90; ldl 34; trig 121; hdl 32 12-26-15: tsh 3.07; vit D 18 12-28-15: uric acid 4.2; tsh 4.75; free  T4: 1.1; hgb a1c 6.3 12-30-15: uric acid 4.3; free T4: 1.0; chol 80; ldl 24; trig 164; hdl 23; hgb a1c 6.5     Review of Systems Constitutional: Negative for appetite change and fatigue.  HENT: Negative for congestion.   Respiratory: Negative for cough, chest tightness and shortness of breath.   Cardiovascular: Negative for chest pain and palpitations. No edema  Gastrointestinal: Negative for nausea, abdominal pain, diarrhea and constipation.  Musculoskeletal: has burning pain in feet   Skin: Negative for pallor.  Neurological: Negative for dizziness.  Psychiatric/Behavioral: The patient is not nervous/anxious.     Physical Exam Constitutional: She is oriented to person, place, and time. No distress.  Morbidly obese   Eyes: Conjunctivae are normal.  Neck: Neck supple. No JVD present. No thyromegaly present.  Cardiovascular: Normal rate, regular rhythm and intact distal pulses.   Respiratory: Effort normal and breath sounds normal. No respiratory distress. She has no wheezes.  GI: Soft. Bowel sounds are normal. She exhibits no distension. There is no tenderness.  Musculoskeletal: She exhibits 2+ lower extremity edema   Able to move all extremities  Has limited range of motion due to obesity   Lymphadenopathy:    She has no cervical adenopathy.  Neurological: She is alert and oriented to person, place, and time.  Skin: Skin is warm and dry. She is not diaphoretic.    Psychiatric: She has a normal mood and affect.      ASSESSMENT/ PLAN:  1. Gout: no recent flares will continue allopurinol 200 mg daily will monitor  2. DVT: she has completed her xarelto therapy;  will monitor   3. Chronic pain: her pain is being managed with her current regimen will continue ultram 200 mg twice daily; elavil 100 mg nightly; zanaflex 4 mg every 8 hours as needed robaxin 500 mg every 8 hours as needed;   Will increase her neurontin 400 mg three times daily   4. Gerd: will continue protonix 40 mg daily  5. Constipation: colace twice daily; lactulose 10 gm daily miralax daily   6. Depression: she is emotionally stable; will continue prozac 40 mg daily and takes melatonin 3 mg nightly for sleep.   7. Diabetes: will continue tradjenta 5 mg daily her hgb a1c is 6.5   8. Hypertension:  Will continue lisinopril 2.5 mg daily;   9. Hypothyroidism: will continue  synthroid 50 mcg daily   tsh is 3.07 free T4 1.0 .   10. Lower extremity edema: will continue lasix  40 mg daily  11. Dyslipidemia: will continue fenofibrate 40 mg daily ldl 24; trig 164   12. Peripheral neuropathy: will continue  her neurontin  400 mg three times daily          Ok Edwards NP [facility]  Contact [phone] Monday through Friday 8am- 5pm  After hours call [phone]

## 2016-03-19 ENCOUNTER — Non-Acute Institutional Stay (SKILLED_NURSING_FACILITY): Payer: Medicare Other | Admitting: Adult Health

## 2016-03-19 ENCOUNTER — Encounter: Payer: Self-pay | Admitting: Adult Health

## 2016-03-19 DIAGNOSIS — D649 Anemia, unspecified: Secondary | ICD-10-CM | POA: Diagnosis not present

## 2016-03-19 DIAGNOSIS — I10 Essential (primary) hypertension: Secondary | ICD-10-CM

## 2016-03-19 DIAGNOSIS — IMO0001 Reserved for inherently not codable concepts without codable children: Secondary | ICD-10-CM

## 2016-03-19 DIAGNOSIS — E1165 Type 2 diabetes mellitus with hyperglycemia: Secondary | ICD-10-CM | POA: Diagnosis not present

## 2016-03-19 DIAGNOSIS — E781 Pure hyperglyceridemia: Secondary | ICD-10-CM

## 2016-03-19 DIAGNOSIS — E1143 Type 2 diabetes mellitus with diabetic autonomic (poly)neuropathy: Secondary | ICD-10-CM

## 2016-03-19 DIAGNOSIS — E038 Other specified hypothyroidism: Secondary | ICD-10-CM | POA: Diagnosis not present

## 2016-03-19 DIAGNOSIS — K5901 Slow transit constipation: Secondary | ICD-10-CM

## 2016-03-19 DIAGNOSIS — E034 Atrophy of thyroid (acquired): Secondary | ICD-10-CM | POA: Diagnosis not present

## 2016-03-19 DIAGNOSIS — M109 Gout, unspecified: Secondary | ICD-10-CM

## 2016-03-19 LAB — MICROALBUMIN, URINE: Microalb, Ur: 0.2

## 2016-03-19 NOTE — Progress Notes (Signed)
Facility: [facility]     CODE STATUS: Full Code  No Known Allergies  Chief Complaint  Patient presents with  . Medical Management of Chronic Issues    Follow up    HPI:  She is a long term resident of this facility being seen for the management of his chronic illnesses. Overall her status is stable. She is not voicing any complaints today. She states that she is feeling good. There are no nursing concerns at this time.    Past Medical History  Diagnosis Date  . Hypertension   . Obesities, morbid ([place])   . Vaginal bleeding   . Endometriosis   . Atopic conjunctivitis   . Diabetes mellitus without complication ([place])   . Cellulitis     legs  . Insomnia   . Cardiomegaly   . Gout     feet  . Anemia   . Stroke [facility]) 2013  . CVA (cerebral vascular accident) ([place])   . Anxiety   . Headache(784.0)     otc meds prn  . Degenerative, intervertebral disc     back, legs  . History of blood transfusion [place] - unsure number of units transfused  . Neuromuscular disorder ([place])     diabetic neuropathy - feet  . Depressive disorder     depressive disorder    Past Surgical History  Procedure Laterality Date  . Cesarean section  1982, 1989    x 2  . Biopsy endometrial N/A 10 03 2013  . Dilation and curettage of uterus  2013  . Tonsillectomy    . Wisdom tooth extraction    . Hysteroscopy w/d&c N/A 06/28/2014    Procedure: DILATATION AND CURETTAGE /HYSTEROSCOPY;  Surgeon: Lavonia Drafts, MD;  Location: [facility];  Service: Gynecology;  Laterality: N/A;    Social History   Social History  . Marital Status: Single    Spouse Name: N/A  . Number of Children: N/A  . Years of Education: N/A   Occupational History  . Not on file.   Social History Main Topics  . Smoking status: Former Smoker -- 0.50 packs/day for 43 years    Types: Cigarettes    Quit date: 06/11/2012  . Smokeless tobacco: Never Used  . Alcohol Use: No  . Drug Use: No  . Sexual  Activity: No   Other Topics Concern  . Not on file   Social History Narrative   Family History  Problem Relation Age of Onset  . Hypertension Mother   . Hypertension Father       VITAL SIGNS BP 100/60 mmHg  Pulse 82  Temp(Src) 97.9 F (36.6 C) (Oral)  Resp 20  Ht 5\' 9"  (1.753 m)  Wt 340 lb (154.223 kg)  BMI 50.19 kg/m2  SpO2 97%  Patient's Medications  New Prescriptions   No medications on file  Previous Medications   ACETAMINOPHEN (TYLENOL) 325 MG TABLET    Take 650 mg by mouth every 6 (six) hours as needed for moderate pain, fever or headache.    ALLOPURINOL (ZYLOPRIM) 100 MG TABLET    Take 200 mg by mouth daily.   AMITRIPTYLINE (ELAVIL) 100 MG TABLET    Take 100 mg by mouth at bedtime.   DOCUSATE SODIUM (COLACE) 100 MG CAPSULE    Take 1 capsule (100 mg total) by mouth 2 (two) times daily.   EPINEPHRINE (EPIPEN) 0.3 MG/0.3 ML DEVI    Inject 0.3 mg into the muscle once.   FENOFIBRATE (  TRICOR) 48 MG TABLET    Take 48 mg by mouth at bedtime.   FLUOXETINE (PROZAC) 40 MG CAPSULE    Take 40 mg by mouth daily.   FUROSEMIDE (LASIX) 20 MG TABLET    Take 20 mg by mouth daily. Reported on 03/19/2016   FUROSEMIDE (LASIX) 40 MG TABLET    Take 40 mg by mouth daily.   GABAPENTIN (NEURONTIN) 400 MG CAPSULE    Take 400 mg by mouth 3 (three) times daily.   HYDROCORTISONE CREAM 1 %    Apply 1 application topically 2 (two) times daily.   LEVOTHYROXINE (SYNTHROID, LEVOTHROID) 50 MCG TABLET    Take 1 tablet (50 mcg total) by mouth daily.   LISINOPRIL (PRINIVIL,ZESTRIL) 2.5 MG TABLET    Take 2.5 mg by mouth daily.   MAGNESIUM HYDROXIDE (MILK OF MAGNESIA) 400 MG/5ML SUSPENSION    Take 30 mLs by mouth daily as needed for mild constipation.   MELATONIN 3 MG TABS    Take 3 mg by mouth at bedtime.   METHOCARBAMOL (ROBAXIN) 500 MG TABLET    Take 500 mg by mouth every 8 (eight) hours as needed for muscle spasms.   MUPIROCIN OINTMENT (BACTROBAN) 2 %    Apply 1 application topically. Apply to Right toe  until 01/22/16   PANTOPRAZOLE (PROTONIX) 40 MG TABLET    Take 1 tablet (40 mg total) by mouth daily.   POLYETHYLENE GLYCOL (MIRALAX / GLYCOLAX) PACKET    Take 17 g by mouth daily.   SODIUM CHLORIDE (OCEAN) 0.65 % SOLN NASAL SPRAY    Place 1 spray into both nostrils as needed for congestion.   TIZANIDINE (ZANAFLEX) 4 MG TABLET    Take 4 mg by mouth every 8 (eight) hours as needed for muscle spasms.   TRAMADOL (ULTRAM) 50 MG TABLET    Take 4 tablets (200 mg total) by mouth every 12 (twelve) hours. For pain   VITAMIN D, ERGOCALCIFEROL, (DRISDOL) 50000 UNITS CAPS CAPSULE    Take 50,000 Units by mouth every 7 (seven) days.  Modified Medications   No medications on file  Discontinued Medications   ATORVASTATIN (LIPITOR) 40 MG TABLET    Take 40 mg by mouth daily. Reported on 03/19/2016   GABAPENTIN (NEURONTIN) 300 MG CAPSULE    Take 300 mg by mouth every 8 (eight) hours. Reported on 03/19/2016     SIGNIFICANT DIAGNOSTIC EXAMS  12-14-14 ct of head: No evidence of acute intracranial abnormality. Small vessel ischemic changes with intracranial atherosclerosis.  12-14-14: chest x-ray: Elevation left hemidiaphragm. No edema or consolidation. Mild cardiac enlargement.  12-15-14: ct of abdomen and pelvis: No evidence of abdominal, pelvic, or retroperitoneal hematoma. Prominent stool-filled rectosigmoid colon with gaseous distention of the remainder of the colon suggesting obstipation with pseudo-obstruction. Mass versus prominent vascularity in the subcarinal region.   12-15-14: renal ultrasound: Study is moderately limited by patient body habitus but appears grossly normal.   12-15-14: ct of chest: 1. While the mediastinal structures are difficult to evaluate without IV contrast, there does not appear to be a subcarinal mass and the findings on comparison CT likely represent the left atrium. 2. Left basilar atelectasis.  12-15-14: EEG: This EEG is abnormal with mild localized nonspecific continuous slowing of  cerebral activity. No evidence of an epileptic disorder was demonstrated. Lack of epileptiform activity during EEG recording does not rule out seizure disorder, however  04-29-15: right lower extremity doppler: neg for dvt  06-01-15: No active cardiopulmonary disease.Right-sided PICC line with the  tip projecting over the SVC     LABS REVIEWED:   04-01-15: glucose 98; bun 22; creat 1.70; k+4.8; na++138  05-02-15: hgb a1c 6.2 06-01-15: wbc 6.5 ;hgb 9.7 hct 30.0; mcv 94.3; plt 203; glucose 102; bun 15; creat 1.62; k+ 4.6 na++131; liver normal albumin 3.0; blood culture: no growth 06-02-15: hgb a1c 6.3; tsh 2.979; mag 1.6; phos 3.2; urine culture: no growth 06-03-15: wbc 5.0 hgb 9.5; hct 28.8; mcv 96.0; plt 192; glucose 130; bun 13; creat 1.55; k+4.3; na++134 06-10-15: wbc 5.6 hgb 8.1; hct 24.7; mcv 91.5; plt 181; glucose 108; bun 17; creat 1.50; k+ 4.0; na++139  06-20-15: glucose 21; bun 21; creat 1.95; k+ 4.8; na++135; mag 1.9  10-10-15: wbc 7.7; hgb 11.2; hct 33.6; mcv 93.6; plt 283; glucose 132; bun 38; creat 2.30; k+ 4.4; na++137; liver normal albumin 3.6; chol 149; ldl 74; trig 222; hdl 31; hgb a1c 6.2; tsh 10.013  12-12-15: chol 90; ldl 34; trig 121; hdl 32 12-26-15: tsh 3.07; vit D 18 12-28-15: uric acid 4.2; tsh 4.75; free  T4: 1.1; hgb a1c 6.3 12-30-15: uric acid 4.3; free T4: 1.0; chol 80; ldl 24; trig 164; hdl 23; hgb a1c 6.5     Review of Systems Constitutional: Negative for appetite change and fatigue.  HENT: Negative for congestion.   Respiratory: Negative for cough, chest tightness and shortness of breath.   Cardiovascular: Negative for chest pain and palpitations. No edema  Gastrointestinal: Negative for nausea, abdominal pain, diarrhea and constipation.  Musculoskeletal:no complaint of pain  Skin: Negative for pallor.  Neurological: Negative for dizziness.  Psychiatric/Behavioral: The patient is not nervous/anxious.     Physical Exam Constitutional: She is oriented to person,  place, and time. No distress.  Morbidly obese   Eyes: Conjunctivae are normal.  Neck: Neck supple. No JVD present. No thyromegaly present.  Cardiovascular: Normal rate, regular rhythm and intact distal pulses.   Respiratory: Effort normal and breath sounds normal. No respiratory distress. She has no wheezes.  GI: Soft. Bowel sounds are normal. She exhibits no distension. There is no tenderness.  Musculoskeletal: She exhibits 2+ lower extremity edema   Able to move all extremities  Has limited range of motion due to obesity   Lymphadenopathy:    She has no cervical adenopathy.  Neurological: She is alert and oriented to person, place, and time.  Skin: Skin is warm and dry. She is not diaphoretic.  Psychiatric: She has a normal mood and affect.      ASSESSMENT/ PLAN:  1. Gout: no recent flares will continue allopurinol 200 mg daily will monitor  2. DVT: she has completed her xarelto therapy;  will monitor   3. Chronic pain: her pain is being managed with her current regimen will continue ultram 200 mg twice daily; elavil 100 mg nightly; zanaflex 4 mg every 8 hours as needed robaxin 500 mg every 8 hours as needed;   Will continue  neurontin 400 mg three times daily   4. Gerd: will continue protonix 40 mg daily  5. Constipation: colace twice daily; lactulose 10 gm daily miralax daily   6. Depression: she is emotionally stable; will continue prozac 40 mg daily and takes melatonin 3 mg nightly for sleep.   7. Diabetes: will continue tradjenta 5 mg daily her hgb a1c is 6.5   8. Hypertension:  Will continue lisinopril 2.5 mg daily;   9. Hypothyroidism: will continue  synthroid 50 mcg daily   tsh is 3.07 free T4 1.0 .  10. Lower extremity edema: will continue lasix  40 mg daily  11. Dyslipidemia: will continue fenofibrate 48 mg daily ldl 24; trig 164   12. Peripheral neuropathy: will continue  her neurontin  400 mg three times daily    Will get urine for micro-albumin     Ok Edwards NP [facility]  Contact [phone] Monday through Friday 8am- 5pm  After hours call [phone]

## 2016-03-30 LAB — BASIC METABOLIC PANEL: Glucose: 109 mg/dL

## 2016-04-17 ENCOUNTER — Encounter: Payer: Self-pay | Admitting: Internal Medicine

## 2016-04-17 ENCOUNTER — Non-Acute Institutional Stay (SKILLED_NURSING_FACILITY): Payer: Medicare Other | Admitting: Internal Medicine

## 2016-04-17 DIAGNOSIS — N183 Chronic kidney disease, stage 3 (moderate): Secondary | ICD-10-CM

## 2016-04-17 DIAGNOSIS — E1122 Type 2 diabetes mellitus with diabetic chronic kidney disease: Secondary | ICD-10-CM | POA: Diagnosis not present

## 2016-04-17 DIAGNOSIS — L03011 Cellulitis of right finger: Secondary | ICD-10-CM | POA: Diagnosis not present

## 2016-04-17 DIAGNOSIS — G8929 Other chronic pain: Secondary | ICD-10-CM

## 2016-04-17 DIAGNOSIS — R609 Edema, unspecified: Secondary | ICD-10-CM

## 2016-04-17 NOTE — Progress Notes (Signed)
DATE: 04/17/16  Location:  [facility] Room Number: 119 B Place of Service: SNF (31)   Extended Emergency Contact Information Primary Emergency Contact: Hill,[name] Address: [address] [place], [place] [address] [place] Phone: [phone] Relation: Mother  Advanced Directive information Does patient have an advance directive?: No, Would patient like information on creating an advanced directive?: No - patient declined information FULL CODE Chief Complaint  Patient presents with  . Acute Visit    patient has an ingrown toenail with pus    HPI:  60 yo female long term resident seen today for infected toenail. She reports pain and swelling in right 1st toenail x few weeks. (+) purulent d/c. She is also c/a b/l LE edema. She takes lasix 40mg  daily. No f/c, CP or new SOB. No known insect bites.  Gout - no recent flares; takes allopurinol 200 mg daily   hx DVT -she has completed her xarelto therapy  Chronic pain - pain stable on ultram 200 mg twice daily; elavil 100 mg nightly; zanaflex 4 mg every 8 hours as needed; robaxin 500 mg every 8 hours as needed;   neurontin 400 mg three times daily for neuropathy  DM - stable on tradjenta 5 mg daily. Hgb A1c 6.5%    Peripheral neuropathy - due to DM. Takes neurontin 400 mg three times daily   Past Medical History  Diagnosis Date  . Hypertension   . Obesities, morbid ([place])   . Vaginal bleeding   . Endometriosis   . Atopic conjunctivitis   . Diabetes mellitus without complication ([place])   . Cellulitis     legs  . Insomnia   . Cardiomegaly   . Gout     feet  . Anemia   . Stroke [facility]) 2013  . CVA (cerebral vascular accident) ([place])   . Anxiety   . Headache(784.0)     otc meds prn  . Degenerative, intervertebral disc     back, legs  . History of blood transfusion [place] - unsure number of units transfused  . Neuromuscular disorder ([place])     diabetic neuropathy - feet  . Depressive disorder     depressive disorder    Past Surgical History  Procedure Laterality Date  . Cesarean section  1982, 1989    x 2  . Biopsy endometrial N/A 10 03 2013  . Dilation and curettage of uterus  2013  . Tonsillectomy    . Wisdom tooth extraction    . Hysteroscopy w/d&c N/A 06/28/2014    Procedure: DILATATION AND CURETTAGE /HYSTEROSCOPY;  Surgeon: Lavonia Drafts, MD;  Location: [facility];  Service: Gynecology;  Laterality: N/A;    Patient Care Team: Gildardo Cranker, DO as PCP - General (Internal Medicine) Gerlene Fee, NP as Nurse Practitioner (Nurse Practitioner) [place] ([place])  Social History   Social History  . Marital Status: Single    Spouse Name: N/A  . Number of Children: N/A  . Years of Education: N/A   Occupational History  . Not on file.   Social History Main Topics  . Smoking status: Former Smoker -- 0.50 packs/day for 43 years    Types: Cigarettes    Quit date: 06/11/2012  . Smokeless tobacco: Never Used  . Alcohol Use: No  . Drug Use: No  . Sexual Activity: No  Other Topics Concern  . Not on file   Social History Narrative     reports that she quit smoking about 3 years ago. Her smoking use included Cigarettes. She has a 21.5 pack-year smoking history. She has never used smokeless tobacco. She reports that she does not drink alcohol or use illicit drugs.  Immunization History  Administered Date(s) Administered  . Influenza Whole 09/09/2013  . Influenza-Unspecified 09/01/2014, 08/12/2015  . Pneumococcal Polysaccharide-23 12/16/2014  . Td 07/22/2006    No Known Allergies  Medications: Patient's Medications  New Prescriptions   No medications on file  Previous Medications   ACETAMINOPHEN (TYLENOL) 325 MG TABLET    Take 650 mg by mouth every 6 (six) hours as needed for moderate pain, fever or headache.    ALLOPURINOL (ZYLOPRIM) 100 MG TABLET    Take  200 mg by mouth daily.   AMITRIPTYLINE (ELAVIL) 100 MG TABLET    Take 100 mg by mouth at bedtime.   DOCUSATE SODIUM (COLACE) 100 MG CAPSULE    Take 1 capsule (100 mg total) by mouth 2 (two) times daily.   EPINEPHRINE (EPIPEN) 0.3 MG/0.3 ML DEVI    Inject 0.3 mg into the muscle once.   FENOFIBRATE (TRICOR) 48 MG TABLET    Take 48 mg by mouth at bedtime.   FLUOXETINE (PROZAC) 40 MG CAPSULE    Take 40 mg by mouth daily.   FUROSEMIDE (LASIX) 40 MG TABLET    Take 40 mg by mouth daily.   GABAPENTIN (NEURONTIN) 400 MG CAPSULE    Take 400 mg by mouth 3 (three) times daily.   HYDROCORTISONE CREAM 1 %    Apply 1 application topically 2 (two) times daily.   LEVOTHYROXINE (SYNTHROID, LEVOTHROID) 50 MCG TABLET    Take 1 tablet (50 mcg total) by mouth daily.   LISINOPRIL (PRINIVIL,ZESTRIL) 2.5 MG TABLET    Take 2.5 mg by mouth daily.   MAGNESIUM HYDROXIDE (MILK OF MAGNESIA) 400 MG/5ML SUSPENSION    Take 30 mLs by mouth daily as needed for mild constipation.   MELATONIN 3 MG TABS    Take 3 mg by mouth at bedtime.   METHOCARBAMOL (ROBAXIN) 500 MG TABLET    Take 500 mg by mouth every 8 (eight) hours as needed for muscle spasms. Reported on 04/17/2016   PANTOPRAZOLE (PROTONIX) 40 MG TABLET    Take 1 tablet (40 mg total) by mouth daily.   POLYETHYLENE GLYCOL (MIRALAX / GLYCOLAX) PACKET    Take 17 g by mouth daily.   SODIUM CHLORIDE (OCEAN) 0.65 % SOLN NASAL SPRAY    Place 1 spray into both nostrils as needed for congestion.   TIZANIDINE (ZANAFLEX) 4 MG TABLET    Take 4 mg by mouth every 8 (eight) hours as needed for muscle spasms.   TRAMADOL (ULTRAM) 50 MG TABLET    Take 4 tablets (200 mg total) by mouth every 12 (twelve) hours. For pain   VITAMIN D, ERGOCALCIFEROL, (DRISDOL) 50000 UNITS CAPS CAPSULE    Take 50,000 Units by mouth every 7 (seven) days. Every Wednesday  Modified Medications   No medications on file  Discontinued Medications   FUROSEMIDE (LASIX) 20 MG TABLET    Take 40 mg by mouth daily. Reported on  04/17/2016   MUPIROCIN OINTMENT (BACTROBAN) 2 %    Apply 1 application topically. Reported on 04/17/2016    Review of Systems  Musculoskeletal: Positive for joint swelling, arthralgias and gait problem.  Skin: Positive for wound.  Neurological: Positive for  numbness.  All other systems reviewed and are negative.   Filed Vitals:   04/17/16 1217  BP: 102/66  Pulse: 81  Temp: 97.5 F (36.4 C)  TempSrc: Oral  Resp: 18  Height: 5\' 9"  (1.753 m)  Weight: 355 lb 12.8 oz (161.39 kg)  SpO2: 97%   Body mass index is 52.52 kg/(m^2).  Physical Exam  Constitutional: She is oriented to person, place, and time. She appears well-developed and well-nourished. No distress.  Lying in bed in NAD  Cardiovascular:  R>L +1 pitting LE edema. No calf TTP. Distal pulses palpable  Musculoskeletal: She exhibits edema and tenderness.  Neurological: She is alert and oriented to person, place, and time.  Skin: Skin is warm. There is erythema.  Right 1st toenail with purulent dc, abnormal appearing nail, red, swelling and TTP with reduced ROM at toe joint.  Psychiatric: She has a normal mood and affect. Her behavior is normal. Thought content normal.     Labs reviewed:  Abstract on 03/30/2016  Component Date Value Ref Range Status  . Glucose 03/30/2016 109   Final  Nursing Home on 01/19/2016  Component Date Value Ref Range Status  . Triglycerides 12/30/2015 164* 40 - 160 mg/dL Final  . Cholesterol 12/30/2015 80  0 - 200 mg/dL Final  . HDL 12/30/2015 23* 35 - 70 mg/dL Final  . LDL Cholesterol 12/30/2015 24   Final  . Hemoglobin A1C 12/30/2015 6.5   Final  . Vit D, 25-Hydroxy 12/26/2015 18   Final  . Uric Acid 12/30/2015 4.3   Final    No results found.   Assessment/Plan   ICD-9-CM ICD-10-CM   1. [place]ia, right - new 681.9 L03.011    1st toenail  2. Edema, unspecified type - worse 782.3 R60.9   3. Type 2 diabetes mellitus with stage 3 chronic kidney disease, without long-term current use  of insulin (HCC) 250.40 E11.22    585.3 N18.3   4. Chronic pain 338.29 G89.29     Start doxy 100mg  po BID x 10 days  floraster po BID x 3 weeks  Check BMP. T/c increase lasix for edema if Cr stable  Facility wound care to follow and tx  Will follow  Bryttney Netzer S. Perlie Gold  [facility] [address] [place], [place] [address] [phone] Cell (Monday-Friday 8 AM - 5 PM) [phone] After 5 PM and follow prompts

## 2016-04-19 ENCOUNTER — Encounter: Payer: Self-pay | Admitting: Adult Health

## 2016-04-19 ENCOUNTER — Non-Acute Institutional Stay (SKILLED_NURSING_FACILITY): Payer: Medicare Other | Admitting: Adult Health

## 2016-04-19 DIAGNOSIS — N183 Chronic kidney disease, stage 3 unspecified: Secondary | ICD-10-CM

## 2016-04-19 DIAGNOSIS — I10 Essential (primary) hypertension: Secondary | ICD-10-CM | POA: Diagnosis not present

## 2016-04-19 DIAGNOSIS — G8929 Other chronic pain: Secondary | ICD-10-CM | POA: Diagnosis not present

## 2016-04-19 DIAGNOSIS — E1169 Type 2 diabetes mellitus with other specified complication: Secondary | ICD-10-CM | POA: Diagnosis not present

## 2016-04-19 DIAGNOSIS — E785 Hyperlipidemia, unspecified: Secondary | ICD-10-CM

## 2016-04-19 DIAGNOSIS — E038 Other specified hypothyroidism: Secondary | ICD-10-CM

## 2016-04-19 DIAGNOSIS — E1143 Type 2 diabetes mellitus with diabetic autonomic (poly)neuropathy: Secondary | ICD-10-CM

## 2016-04-19 DIAGNOSIS — M109 Gout, unspecified: Secondary | ICD-10-CM | POA: Diagnosis not present

## 2016-04-19 DIAGNOSIS — E034 Atrophy of thyroid (acquired): Secondary | ICD-10-CM | POA: Diagnosis not present

## 2016-04-19 NOTE — Progress Notes (Signed)
Patient ID: Lauren Barber, female   DOB: [birth year], 58 y.o.   MRN: [id number]   Location:  [facility] Room Number: 119-B Place of Service:  SNF (31)   CODE STATUS: Full Code  No Known Allergies  Chief Complaint  Patient presents with  . Medical Management of Chronic Issues    Follow up    HPI:  She is a long term resident of this facility being seen for the management of her chronic illnesses. overall her statu sis stable. She continues to have right great toe pain and is red and swollen. She has a history of gout; she feels as though her gout is getting worse. She is on long term allopurinol. There are no nursing concerns at this time.   Past Medical History  Diagnosis Date  . Hypertension   . Obesities, morbid ([place])   . Vaginal bleeding   . Endometriosis   . Atopic conjunctivitis   . Diabetes mellitus without complication ([place])   . Cellulitis     legs  . Insomnia   . Cardiomegaly   . Gout     feet  . Anemia   . Stroke [facility]) 2013  . CVA (cerebral vascular accident) ([place])   . Anxiety   . Headache(784.0)     otc meds prn  . Degenerative, intervertebral disc     back, legs  . History of blood transfusion [place] - unsure number of units transfused  . Neuromuscular disorder ([place])     diabetic neuropathy - feet  . Depressive disorder     depressive disorder    Past Surgical History  Procedure Laterality Date  . Cesarean section  1982, 1989    x 2  . Biopsy endometrial N/A 10 03 2013  . Dilation and curettage of uterus  2013  . Tonsillectomy    . Wisdom tooth extraction    . Hysteroscopy w/d&c N/A 06/28/2014    Procedure: DILATATION AND CURETTAGE /HYSTEROSCOPY;  Surgeon: Lavonia Drafts, MD;  Location: [facility];  Service: Gynecology;  Laterality: N/A;    Social History   Social History  . Marital Status: Single    Spouse Name: N/A  . Number of Children: N/A  . Years of Education: N/A   Occupational  History  . Not on file.   Social History Main Topics  . Smoking status: Former Smoker -- 0.50 packs/day for 43 years    Types: Cigarettes    Quit date: 06/11/2012  . Smokeless tobacco: Never Used  . Alcohol Use: No  . Drug Use: No  . Sexual Activity: No   Other Topics Concern  . Not on file   Social History Narrative   Family History  Problem Relation Age of Onset  . Hypertension Mother   . Hypertension Father       VITAL SIGNS BP 124/62 mmHg  Pulse 81  Temp(Src) 97.5 F (36.4 C) (Oral)  Resp 18  Ht 5\' 9"  (1.753 m)  Wt 355 lb 8 oz (161.254 kg)  BMI 52.47 kg/m2  SpO2 97%  Patient's Medications  New Prescriptions   No medications on file  Previous Medications   ACETAMINOPHEN (TYLENOL) 325 MG TABLET    Take 650 mg by mouth every 6 (six) hours as needed for moderate pain, fever or headache.    ACIDOPHILUS LACTOBACILLUS CAPS    Take 1 capsule by mouth 2 (two) times daily.   ALLOPURINOL (ZYLOPRIM) 100 MG TABLET  Take 200 mg by mouth daily.   AMITRIPTYLINE (ELAVIL) 100 MG TABLET    Take 100 mg by mouth at bedtime.   DOCUSATE SODIUM (COLACE) 100 MG CAPSULE    Take 1 capsule (100 mg total) by mouth 2 (two) times daily.   DOXYCYCLINE (VIBRAMYCIN) 100 MG CAPSULE    Take 100 mg by mouth 2 (two) times daily.   EPINEPHRINE (EPIPEN) 0.3 MG/0.3 ML DEVI    Inject 0.3 mg into the muscle once.   FENOFIBRATE (TRICOR) 48 MG TABLET    Take 48 mg by mouth at bedtime.   FLUOXETINE (PROZAC) 40 MG CAPSULE    Take 40 mg by mouth daily.   FUROSEMIDE (LASIX) 40 MG TABLET    Take 40 mg by mouth daily.   GABAPENTIN (NEURONTIN) 400 MG CAPSULE    Take 400 mg by mouth 3 (three) times daily.   HYDROCORTISONE CREAM 1 %    Apply 1 application topically 2 (two) times daily.   LEVOTHYROXINE (SYNTHROID, LEVOTHROID) 50 MCG TABLET    Take 1 tablet (50 mcg total) by mouth daily.   LISINOPRIL (PRINIVIL,ZESTRIL) 2.5 MG TABLET    Take 2.5 mg by mouth daily.   MAGNESIUM HYDROXIDE (MILK OF MAGNESIA) 400  MG/5ML SUSPENSION    Take 30 mLs by mouth daily as needed for mild constipation. Reported on 04/19/2016   MELATONIN 3 MG TABS    Take 3 mg by mouth at bedtime.   METHOCARBAMOL (ROBAXIN) 500 MG TABLET    Take 500 mg by mouth every 8 (eight) hours as needed for muscle spasms. Reported on 04/17/2016   PANTOPRAZOLE (PROTONIX) 40 MG TABLET    Take 1 tablet (40 mg total) by mouth daily.   POLYETHYLENE GLYCOL (MIRALAX / GLYCOLAX) PACKET    Take 17 g by mouth daily.   SODIUM CHLORIDE (OCEAN) 0.65 % SOLN NASAL SPRAY    Place 1 spray into both nostrils as needed for congestion.   TIZANIDINE (ZANAFLEX) 4 MG TABLET    Take 4 mg by mouth every 8 (eight) hours as needed for muscle spasms.   TRAMADOL (ULTRAM) 50 MG TABLET    Take 4 tablets (200 mg total) by mouth every 12 (twelve) hours. For pain   VITAMIN D, ERGOCALCIFEROL, (DRISDOL) 50000 UNITS CAPS CAPSULE    Take 50,000 Units by mouth every 7 (seven) days. Every Wednesday  Modified Medications   No medications on file  Discontinued Medications   No medications on file     SIGNIFICANT DIAGNOSTIC EXAMS  12-14-14 ct of head: No evidence of acute intracranial abnormality. Small vessel ischemic changes with intracranial atherosclerosis.  12-14-14: chest x-ray: Elevation left hemidiaphragm. No edema or consolidation. Mild cardiac enlargement.  12-15-14: ct of abdomen and pelvis: No evidence of abdominal, pelvic, or retroperitoneal hematoma. Prominent stool-filled rectosigmoid colon with gaseous distention of the remainder of the colon suggesting obstipation with pseudo-obstruction. Mass versus prominent vascularity in the subcarinal region.   12-15-14: renal ultrasound: Study is moderately limited by patient body habitus but appears grossly normal.   12-15-14: ct of chest: 1. While the mediastinal structures are difficult to evaluate without IV contrast, there does not appear to be a subcarinal mass and the findings on comparison CT likely represent the left  atrium. 2. Left basilar atelectasis.  12-15-14: EEG: This EEG is abnormal with mild localized nonspecific continuous slowing of cerebral activity. No evidence of an epileptic disorder was demonstrated. Lack of epileptiform activity during EEG recording does not rule out seizure disorder,  however  04-29-15: right lower extremity doppler: neg for dvt  06-01-15: No active cardiopulmonary disease.Right-sided PICC line with the tip projecting over the SVC     LABS REVIEWED:   06-01-15: wbc 6.5 ;hgb 9.7 hct 30.0; mcv 94.3; plt 203; glucose 102; bun 15; creat 1.62; k+ 4.6 na++131; liver normal albumin 3.0; blood culture: no growth 06-02-15: hgb a1c 6.3; tsh 2.979; mag 1.6; phos 3.2; urine culture: no growth 06-03-15: wbc 5.0 hgb 9.5; hct 28.8; mcv 96.0; plt 192; glucose 130; bun 13; creat 1.55; k+4.3; na++134 06-10-15: wbc 5.6 hgb 8.1; hct 24.7; mcv 91.5; plt 181; glucose 108; bun 17; creat 1.50; k+ 4.0; na++139  06-20-15: glucose 21; bun 21; creat 1.95; k+ 4.8; na++135; mag 1.9  10-10-15: wbc 7.7; hgb 11.2; hct 33.6; mcv 93.6; plt 283; glucose 132; bun 38; creat 2.30; k+ 4.4; na++137; liver normal albumin 3.6; chol 149; ldl 74; trig 222; hdl 31; hgb a1c 6.2; tsh 10.013  12-12-15: chol 90; ldl 34; trig 121; hdl 32 12-26-15: tsh 3.07; vit D 18 12-28-15: uric acid 4.2; tsh 4.75; free  T4: 1.1; hgb a1c 6.3 12-30-15: uric acid 4.3; free T4: 1.0; chol 80; ldl 24; trig 164; hdl 23; hgb a1c 6.5  03-19-16: urine micro-albumin <0.2 04-18-16: glucose 122; bun 32; creat 1.89; k+ 4.1; na++136     Review of Systems Constitutional: Negative for appetite change and fatigue.  HENT: Negative for congestion.   Respiratory: Negative for cough, chest tightness and shortness of breath.   Cardiovascular: Negative for chest pain and palpitations. No edema  Gastrointestinal: Negative for nausea, abdominal pain, diarrhea and constipation.  Musculoskeletal:has right great toe pain; redness and swelling Skin: Negative for pallor.    Neurological: Negative for dizziness.  Psychiatric/Behavioral: The patient is not nervous/anxious.     Physical Exam Constitutional: She is oriented to person, place, and time. No distress.  Morbidly obese   Eyes: Conjunctivae are normal.  Neck: Neck supple. No JVD present. No thyromegaly present.  Cardiovascular: Normal rate, regular rhythm and intact distal pulses.   Respiratory: Effort normal and breath sounds normal. No respiratory distress. She has no wheezes.  GI: Soft. Bowel sounds are normal. She exhibits no distension. There is no tenderness.  Musculoskeletal: She exhibits 1+ lower extremity edema   Able to move all extremities  Has limited range of motion due to obesity   Right great toe is swollen; red; hot and very tender to touch.  Lymphadenopathy:    She has no cervical adenopathy.  Neurological: She is alert and oriented to person, place, and time.  Skin: Skin is warm and dry. She is not diaphoretic.  Psychiatric: She has a normal mood and affect.      ASSESSMENT/ PLAN:  1. Gout: will increase her allopurinol to 300 mg daily; will being colchicine 0.6 mg 2 doses today then daily for 10 days and will monitor   2. DVT: she has completed her xarelto therapy;  will monitor   3. Chronic pain: her pain is being managed with her current regimen will continue ultram 200 mg twice daily; elavil 100 mg nightly; zanaflex 4 mg every 8 hours as needed robaxin 500 mg every 8 hours as needed;   Will continue  neurontin 400 mg three times daily   4. Gerd: will continue protonix 40 mg daily  5. Constipation: colace twice daily; lactulose 10 gm daily miralax daily   6. Depression: she is emotionally stable; will continue prozac 40 mg daily and takes  melatonin 3 mg nightly for sleep.   7. Diabetes: will continue tradjenta 5 mg daily her hgb a1c is 6.5   8. Hypertension:  Will continue lisinopril 2.5 mg daily;   9. Hypothyroidism: will continue  synthroid 50 mcg daily   tsh is  3.07 free T4: 1.0 .   10. Lower extremity edema: will continue lasix  40 mg daily  11. Dyslipidemia: will continue fenofibrate 48 mg daily ldl 24; trig 164   12. Peripheral neuropathy: will continue  her neurontin  400 mg three times daily   Will check uric acid level; cbc; cmp; hgb a1c    Ok Edwards NP [facility]  Contact [phone] Monday through Friday 8am- 5pm  After hours call [phone]

## 2016-04-20 LAB — HEMOGLOBIN A1C: Hemoglobin A1C: 4.6

## 2016-04-20 LAB — BASIC METABOLIC PANEL
BUN: 37 mg/dL — AB (ref 4–21)
Creatinine: 1.9 mg/dL — AB (ref 0.5–1.1)
GLUCOSE: 135 mg/dL
POTASSIUM: 4.6 mmol/L (ref 3.4–5.3)
Sodium: 138 mmol/L (ref 137–147)

## 2016-04-20 LAB — CBC AND DIFFERENTIAL
HEMATOCRIT: 29 % — AB (ref 36–46)
Hemoglobin: 9.5 g/dL — AB (ref 12.0–16.0)
Neutrophils Absolute: 3552 /uL
Platelets: 297 10*3/uL (ref 150–399)
WBC: 7.4 10^3/mL

## 2016-04-20 LAB — HEPATIC FUNCTION PANEL
ALT: 30 U/L (ref 7–35)
AST: 27 U/L (ref 13–35)
Alkaline Phosphatase: 70 U/L (ref 25–125)
Bilirubin, Total: 0.3 mg/dL

## 2016-04-24 ENCOUNTER — Encounter: Payer: Self-pay | Admitting: Internal Medicine

## 2016-04-24 NOTE — Progress Notes (Signed)
This encounter was created in error - please disregard.

## 2016-05-05 DIAGNOSIS — E1143 Type 2 diabetes mellitus with diabetic autonomic (poly)neuropathy: Secondary | ICD-10-CM | POA: Insufficient documentation

## 2016-05-05 DIAGNOSIS — E1169 Type 2 diabetes mellitus with other specified complication: Secondary | ICD-10-CM | POA: Insufficient documentation

## 2016-05-05 DIAGNOSIS — E785 Hyperlipidemia, unspecified: Secondary | ICD-10-CM

## 2016-05-25 ENCOUNTER — Non-Acute Institutional Stay (SKILLED_NURSING_FACILITY): Payer: Medicare Other | Admitting: Adult Health

## 2016-05-25 ENCOUNTER — Encounter: Payer: Self-pay | Admitting: Adult Health

## 2016-05-25 DIAGNOSIS — I10 Essential (primary) hypertension: Secondary | ICD-10-CM | POA: Diagnosis not present

## 2016-05-25 DIAGNOSIS — E1143 Type 2 diabetes mellitus with diabetic autonomic (poly)neuropathy: Secondary | ICD-10-CM

## 2016-05-25 DIAGNOSIS — R600 Localized edema: Secondary | ICD-10-CM | POA: Diagnosis not present

## 2016-05-25 DIAGNOSIS — E785 Hyperlipidemia, unspecified: Secondary | ICD-10-CM

## 2016-05-25 DIAGNOSIS — E1169 Type 2 diabetes mellitus with other specified complication: Secondary | ICD-10-CM

## 2016-05-25 DIAGNOSIS — N183 Chronic kidney disease, stage 3 unspecified: Secondary | ICD-10-CM

## 2016-05-25 DIAGNOSIS — M109 Gout, unspecified: Secondary | ICD-10-CM | POA: Diagnosis not present

## 2016-05-25 DIAGNOSIS — E038 Other specified hypothyroidism: Secondary | ICD-10-CM

## 2016-05-25 DIAGNOSIS — E034 Atrophy of thyroid (acquired): Secondary | ICD-10-CM

## 2016-05-25 DIAGNOSIS — G8929 Other chronic pain: Secondary | ICD-10-CM | POA: Diagnosis not present

## 2016-05-25 LAB — VITAMIN D 1,25 DIHYDROXY: Vitamin D 1, 25 (OH)2 Total: 25

## 2016-05-25 NOTE — Progress Notes (Signed)
Patient ID: Lauren Barber, female   DOB: 1955/12/20, 60 y.o.   MRN: [id number]    Provider:   Location:   [facility] Room Number: 119-B Place of Service:  SNF (31)   PCP: Gildardo Cranker, DO Patient Care Team: Gildardo Cranker, DO as PCP - General (Internal Medicine) Gerlene Fee, NP as Nurse Practitioner (Nurse Practitioner) Tarri Abernethy ([place]ie)  Extended Emergency Contact Information Primary Emergency Contact: Hill,[name] Address: [address], [place] [address] [place] Phone: [phone] Relation: Mother  Code Status: full code  Goals of Care: Advanced Directive information Advanced Directives 04/17/2016  Does patient have an advance directive? No  Would patient like information on creating an advanced directive? No - patient declined information  Pre-existing out of facility DNR order (yellow form or pink MOST form) -      Allergies  Allergen Reactions  . Shellfish Allergy      Chief Complaint  Patient presents with  . Annual Exam    HPI: Patient is a 60 y.o. female seen today for an annual comprehensive examination. Overall her status is stable. She tells me that she is working to get an apartment with her son in order to move out and to be more independent with her adl's. We did discuss what this could look like for her. She is interested in getting a motorized wheelchair; at this time she will not qualify for this as she is living in snf. There are no nursing concerns at this time.    Past Medical History:  Diagnosis Date  . Anemia   . Anxiety   . Atopic conjunctivitis   . Cardiomegaly   . Cellulitis    legs  . CVA (cerebral vascular accident) ([place])   . Degenerative, intervertebral disc    back, legs  . Depressive disorder    depressive disorder  . Diabetes mellitus without complication ([place])   . Endometriosis   . Gout    feet  . Headache(784.0)    otc meds prn  .  History of blood transfusion [place] - unsure number of units transfused  . Hypertension   . Insomnia   . Neuromuscular disorder ([place])    diabetic neuropathy - feet  . Obesities, morbid ([place])   . Stroke [facility]) 2013  . Vaginal bleeding    Past Surgical History:  Procedure Laterality Date  . BIOPSY ENDOMETRIAL N/A 10 03 2013  . [place]   x 2  . DILATION AND CURETTAGE OF UTERUS  2013  . HYSTEROSCOPY W/D&C N/A 06/28/2014   Procedure: DILATATION AND CURETTAGE /HYSTEROSCOPY;  Surgeon: Lavonia Drafts, MD;  Location: [facility];  Service: Gynecology;  Laterality: N/A;  . TONSILLECTOMY    . WISDOM TOOTH EXTRACTION      reports that she quit smoking about 4 years ago. Her smoking use included Cigarettes. She has a 21.50 pack-year smoking history. She has never used smokeless tobacco. She reports that she does not drink alcohol or use drugs. Social History   Social History  . Marital status: Single    Spouse name: N/A  . Number of children: N/A  . Years of education: N/A   Occupational History  . Not on file.   Social History Main Topics  . Smoking status: Former Smoker    Packs/day: 0.50    Years: 43.00    Types: Cigarettes  Quit date: 06/11/2012  . Smokeless tobacco: Never Used  . Alcohol use No  . Drug use: No  . Sexual activity: No   Other Topics Concern  . Not on file   Social History Narrative  . No narrative on file   Family History  Problem Relation Age of Onset  . Hypertension Mother   . Hypertension Father     Immunization History  Administered Date(s) Administered  . Influenza Whole 09/09/2013  . Influenza-Unspecified 09/01/2014, 08/12/2015  . Pneumococcal Polysaccharide-23 12/16/2014  . Td 07/22/2006     Vitals:   05/25/16 1512  BP: 118/70  Pulse: 87  Resp: 18  Temp: 97.5 F (36.4 C)  TempSrc: Oral  SpO2: 99%  Weight: (!) 351 lb 6 oz (159.4 kg)  Height: 5\' 9"  (1.753 m)   Body mass index is 51.89 kg/m.      Medication List       Accurate as of 05/25/16 11:59 PM. Always use your most recent med list.          acetaminophen 325 MG tablet Commonly known as:  TYLENOL Take 650 mg by mouth every 6 (six) hours as needed for moderate pain, fever or headache.   allopurinol 100 MG tablet Commonly known as:  ZYLOPRIM Take 300 mg by mouth daily.   amitriptyline 100 MG tablet Commonly known as:  ELAVIL Take 100 mg by mouth at bedtime.   docusate sodium 100 MG capsule Commonly known as:  COLACE Take 1 capsule (100 mg total) by mouth 2 (two) times daily.   EPIPEN 0.3 mg/0.3 mL Devi Generic drug:  EPINEPHrine Inject 0.3 mg into the muscle once.   fenofibrate 48 MG tablet Commonly known as:  TRICOR Take 48 mg by mouth at bedtime.   FLUoxetine 40 MG capsule Commonly known as:  PROZAC Take 40 mg by mouth daily.   furosemide 40 MG tablet Commonly known as:  LASIX Take 40 mg by mouth daily.   gabapentin 400 MG capsule Commonly known as:  NEURONTIN Take 400 mg by mouth 3 (three) times daily.   hydrocortisone cream 1 % Apply 1 application topically 2 (two) times daily.   levothyroxine 50 MCG tablet Commonly known as:  SYNTHROID, LEVOTHROID Take 1 tablet (50 mcg total) by mouth daily.   lisinopril 2.5 MG tablet Commonly known as:  PRINIVIL,ZESTRIL Take 2.5 mg by mouth daily.   Melatonin 3 MG Tabs Take 3 mg by mouth at bedtime.   methocarbamol 500 MG tablet Commonly known as:  ROBAXIN Take 500 mg by mouth every 8 (eight) hours as needed for muscle spasms. Reported on 04/17/2016   pantoprazole 40 MG tablet Commonly known as:  PROTONIX Take 1 tablet (40 mg total) by mouth daily.   polyethylene glycol packet Commonly known as:  MIRALAX / GLYCOLAX Take 17 g by mouth daily.   sodium chloride 0.65 % Soln nasal spray Commonly known as:  OCEAN Place 1 spray into both nostrils as needed for congestion.   tiZANidine 4 MG tablet Commonly known as:  ZANAFLEX Take 4 mg by mouth  every 8 (eight) hours as needed for muscle spasms.   traMADol 50 MG tablet Commonly known as:  ULTRAM Take 4 tablets (200 mg total) by mouth every 12 (twelve) hours. For pain   Vitamin D (Ergocalciferol) 50000 units Caps capsule Commonly known as:  DRISDOL Take 50,000 Units by mouth every 7 (seven) days. Every Wednesday        SIGNIFICANT DIAGNOSTIC EXAMS   12-14-14  ct of head: No evidence of acute intracranial abnormality. Small vessel ischemic changes with intracranial atherosclerosis.  12-14-14: chest x-ray: Elevation left hemidiaphragm. No edema or consolidation. Mild cardiac enlargement.  12-15-14: ct of abdomen and pelvis: No evidence of abdominal, pelvic, or retroperitoneal hematoma. Prominent stool-filled rectosigmoid colon with gaseous distention of the remainder of the colon suggesting obstipation with pseudo-obstruction. Mass versus prominent vascularity in the subcarinal region.   12-15-14: renal ultrasound: Study is moderately limited by patient body habitus but appears grossly normal.   12-15-14: ct of chest: 1. While the mediastinal structures are difficult to evaluate without IV contrast, there does not appear to be a subcarinal mass and the findings on comparison CT likely represent the left atrium. 2. Left basilar atelectasis.  12-15-14: EEG: This EEG is abnormal with mild localized nonspecific continuous slowing of cerebral activity. No evidence of an epileptic disorder was demonstrated. Lack of epileptiform activity during EEG recording does not rule out seizure disorder, however  04-29-15: right lower extremity doppler: neg for dvt  06-01-15: No active cardiopulmonary disease.Right-sided PICC line with the tip projecting over the SVC     LABS REVIEWED:   06-01-15: wbc 6.5 ;hgb 9.7 hct 30.0; mcv 94.3; plt 203; glucose 102; bun 15; creat 1.62; k+ 4.6 na++131; liver normal albumin 3.0; blood culture: no growth 06-02-15: hgb a1c 6.3; tsh 2.979; mag 1.6; phos 3.2;  urine culture: no growth 06-03-15: wbc 5.0 hgb 9.5; hct 28.8; mcv 96.0; plt 192; glucose 130; bun 13; creat 1.55; k+4.3; na++134 06-10-15: wbc 5.6 hgb 8.1; hct 24.7; mcv 91.5; plt 181; glucose 108; bun 17; creat 1.50; k+ 4.0; na++139  06-20-15: glucose 21; bun 21; creat 1.95; k+ 4.8; na++135; mag 1.9  10-10-15: wbc 7.7; hgb 11.2; hct 33.6; mcv 93.6; plt 283; glucose 132; bun 38; creat 2.30; k+ 4.4; na++137; liver normal albumin 3.6; chol 149; ldl 74; trig 222; hdl 31; hgb a1c 6.2; tsh 10.013  12-12-15: chol 90; ldl 34; trig 121; hdl 32 12-26-15: tsh 3.07; vit D 18 12-28-15: uric acid 4.2; tsh 4.75; free  T4: 1.1; hgb a1c 6.3 12-30-15: uric acid 4.3; free T4: 1.0; chol 80; ldl 24; trig 164; hdl 23; hgb a1c 6.5  03-19-16: urine micro-albumin <0.2 04-18-16: glucose 122; bun 32; creat 1.89; k+ 4.1; na++136  04-20-16: wbc 7.4; hgb 9.5; hct 29.3; mcv 96.1 ;plt 297; glucose 135; bun 37; creat 1.93; k+ 4.6; na++ 138; liver normal albumin 3.6; hgb a1c 6.5; vit D 25     Review of Systems Constitutional: Negative for appetite change and fatigue.  HENT: Negative for congestion.   Respiratory: Negative for cough, chest tightness and shortness of breath.   Cardiovascular: Negative for chest pain and palpitations. No edema  Gastrointestinal: Negative for nausea, abdominal pain, diarrhea and constipation.  Musculoskeletal: no joint or muscle pain  Skin: Negative for pallor.  Neurological: Negative for dizziness.  Psychiatric/Behavioral: The patient is not nervous/anxious.     Physical Exam Constitutional: She is oriented to person, place, and time. No distress.  Morbidly obese   Eyes: Conjunctivae are normal.  Neck: Neck supple. No JVD present. No thyromegaly present.  Cardiovascular: Normal rate, regular rhythm and intact distal pulses.   Respiratory: Effort normal and breath sounds normal. No respiratory distress. She has no wheezes.  GI: Soft. Bowel sounds are normal. She exhibits no distension. There is no  tenderness.  Musculoskeletal: She exhibits trace lower extremity edema   Able to move all extremities  Has limited range of  motion due to obesity  .  Lymphadenopathy:    She has no cervical adenopathy.  Neurological: She is alert and oriented to person, place, and time.  Skin: Skin is warm and dry. She is not diaphoretic.  Psychiatric: She has a normal mood and affect.      ASSESSMENT/ PLAN:  1. Gout: will continue  allopurinol  300 mg daily; no recent flares   2. DVT: she has completed her xarelto therapy;  will monitor   3. Chronic pain: her pain is being managed with her current regimen will continue ultram 200 mg twice daily; elavil 100 mg nightly; zanaflex 4 mg every 8 hours as needed robaxin 500 mg every 8 hours as needed;   Will continue  neurontin 400 mg three times daily   4. Gerd: will continue protonix 40 mg daily  5. Constipation: colace twice daily; lactulose 10 gm daily miralax daily   6. Depression: she is emotionally stable; will continue prozac 40 mg daily and takes melatonin 3 mg nightly for sleep.   7. Diabetes: will continue tradjenta 5 mg daily her hgb a1c is 6.5   8. Hypertension:  Will continue lisinopril 2.5 mg daily;   9. Hypothyroidism: will continue  synthroid 50 mcg daily   tsh is 3.07 free T4: 1.0 .   10. Lower extremity edema: will continue lasix  40 mg daily  11. Dyslipidemia: will continue fenofibrate 48 mg daily ldl 24; trig 164   12. Peripheral neuropathy: will continue  her neurontin  400 mg three times daily    Screening mammogram; female exam; guaiac stool X3  Time spent with patient  45  minutes >50% time spent counseling; reviewing medical record; tests; labs; and developing future plan of care   Ok Edwards NP [facility]  Contact [phone] Monday through Friday 8am- 5pm  After hours call [phone]

## 2016-06-11 DIAGNOSIS — R600 Localized edema: Secondary | ICD-10-CM | POA: Insufficient documentation

## 2016-07-13 LAB — BASIC METABOLIC PANEL
BUN: 30 mg/dL — AB (ref 4–21)
Creatinine: 1.8 mg/dL — AB (ref 0.5–1.1)
Glucose: 134 mg/dL
Potassium: 4.2 mmol/L (ref 3.4–5.3)
Sodium: 139 mmol/L (ref 137–147)

## 2016-07-27 ENCOUNTER — Non-Acute Institutional Stay (SKILLED_NURSING_FACILITY): Payer: Medicare Other | Admitting: Adult Health

## 2016-07-27 ENCOUNTER — Encounter: Payer: Self-pay | Admitting: Adult Health

## 2016-07-27 DIAGNOSIS — I10 Essential (primary) hypertension: Secondary | ICD-10-CM

## 2016-07-27 DIAGNOSIS — M109 Gout, unspecified: Secondary | ICD-10-CM

## 2016-07-27 DIAGNOSIS — G8929 Other chronic pain: Secondary | ICD-10-CM

## 2016-07-27 DIAGNOSIS — E1169 Type 2 diabetes mellitus with other specified complication: Secondary | ICD-10-CM

## 2016-07-27 DIAGNOSIS — R600 Localized edema: Secondary | ICD-10-CM

## 2016-07-27 DIAGNOSIS — L03115 Cellulitis of right lower limb: Secondary | ICD-10-CM

## 2016-07-27 DIAGNOSIS — E1143 Type 2 diabetes mellitus with diabetic autonomic (poly)neuropathy: Secondary | ICD-10-CM

## 2016-07-27 DIAGNOSIS — K5901 Slow transit constipation: Secondary | ICD-10-CM

## 2016-07-27 DIAGNOSIS — E785 Hyperlipidemia, unspecified: Secondary | ICD-10-CM

## 2016-07-27 NOTE — Progress Notes (Signed)
Patient ID: Lauren Barber, female   DOB: 1956-06-14, 60 y.o.   MRN: [id number]    Location:   [facility] Room Number: 119-B Place of Service:  SNF (31)   CODE STATUS: Full Code  Allergies  Allergen Reactions  . Shellfish Allergy     Chief Complaint  Patient presents with  . Medical Management of Chronic Issues    Follow up    HPI:  She is a long term resident of this facility being seen for the management of her chronic illnesses. She has developed a riht thigh cellulitis. The area is red and hot to touch. There are no open lesions and there are no reports of fever present.   Past Medical History:  Diagnosis Date  . Anemia   . Anxiety   . Atopic conjunctivitis   . Cardiomegaly   . Cellulitis    legs  . CVA (cerebral vascular accident) ([place])   . Degenerative, intervertebral disc    back, legs  . Depressive disorder    depressive disorder  . Diabetes mellitus without complication ([place])   . Endometriosis   . Gout    feet  . Headache(784.0)    otc meds prn  . History of blood transfusion [place] - unsure number of units transfused  . Hypertension   . Insomnia   . Neuromuscular disorder ([place])    diabetic neuropathy - feet  . Obesities, morbid ([place])   . Stroke [facility]) 2013  . Vaginal bleeding     Past Surgical History:  Procedure Laterality Date  . BIOPSY ENDOMETRIAL N/A 10 03 2013  . [place]   x 2  . DILATION AND CURETTAGE OF UTERUS  2013  . HYSTEROSCOPY W/D&C N/A 06/28/2014   Procedure: DILATATION AND CURETTAGE /HYSTEROSCOPY;  Surgeon: Lavonia Drafts, MD;  Location: [facility];  Service: Gynecology;  Laterality: N/A;  . TONSILLECTOMY    . WISDOM TOOTH EXTRACTION      Social History   Social History  . Marital status: Single    Spouse name: N/A  . Number of children: N/A  . Years of education: N/A   Occupational History  . Not on file.   Social History Main Topics  . Smoking status: Former Smoker    Packs/day: 0.50    Years: 43.00    Types: Cigarettes    Quit date: 06/11/2012  . Smokeless tobacco: Never Used  . Alcohol use No  . Drug use: No  . Sexual activity: No   Other Topics Concern  . Not on file   Social History Narrative  . No narrative on file   Family History  Problem Relation Age of Onset  . Hypertension Mother   . Hypertension Father       VITAL SIGNS BP 129/74   Pulse 74   Temp 97.6 F (36.4 C) (Oral)   Resp (!) 21   Ht 5\' 9"  (1.753 m)   Wt (!) 351 lb (159.2 kg)   SpO2 98%   BMI 51.83 kg/m   Patient's Medications  New Prescriptions   No medications on file  Previous Medications   ACETAMINOPHEN (TYLENOL) 325 MG TABLET    Take 650 mg by mouth every 6 (six) hours as needed for moderate pain, fever or headache.    ALLOPURINOL (ZYLOPRIM) 100 MG TABLET    Take 300 mg by mouth daily.   AMITRIPTYLINE (ELAVIL) 100 MG TABLET    Take 100 mg by mouth  at bedtime.   DOCUSATE SODIUM (COLACE) 100 MG CAPSULE    Take 1 capsule (100 mg total) by mouth 2 (two) times daily.   EPINEPHRINE (EPIPEN) 0.3 MG/0.3 ML DEVI    Inject 0.3 mg into the muscle once.   FENOFIBRATE (TRICOR) 48 MG TABLET    Take 48 mg by mouth at bedtime.   FLUOXETINE (PROZAC) 40 MG CAPSULE    Take 40 mg by mouth daily.   GABAPENTIN (NEURONTIN) 400 MG CAPSULE    Take 400 mg by mouth 3 (three) times daily.   HYDROCORTISONE CREAM 1 %    Apply 1 application topically 2 (two) times daily.   LEVOTHYROXINE (SYNTHROID, LEVOTHROID) 50 MCG TABLET    Take 1 tablet (50 mcg total) by mouth daily.   LISINOPRIL (PRINIVIL,ZESTRIL) 2.5 MG TABLET    Take 2.5 mg by mouth daily.   MELATONIN 3 MG TABS    Take 3 mg by mouth at bedtime.   METHOCARBAMOL (ROBAXIN) 500 MG TABLET    Take 500 mg by mouth every 8 (eight) hours as needed for muscle spasms. Reported on 04/17/2016   PANTOPRAZOLE (PROTONIX) 40 MG TABLET    Take 1 tablet (40 mg total) by mouth daily.   POLYETHYLENE GLYCOL (MIRALAX / GLYCOLAX) PACKET    Take 17 g by  mouth daily.   SODIUM CHLORIDE (OCEAN) 0.65 % SOLN NASAL SPRAY    Place 1 spray into both nostrils as needed for congestion.   TIZANIDINE (ZANAFLEX) 4 MG TABLET    Take 4 mg by mouth every 8 (eight) hours as needed for muscle spasms.   TORSEMIDE (DEMADEX) 20 MG TABLET    Take 20 mg by mouth daily.   TRAMADOL (ULTRAM) 50 MG TABLET    Take 4 tablets (200 mg total) by mouth every 12 (twelve) hours. For pain   VITAMIN D, ERGOCALCIFEROL, (DRISDOL) 50000 UNITS CAPS CAPSULE    Take 50,000 Units by mouth every 7 (seven) days. Every Wednesday  Modified Medications   No medications on file  Discontinued Medications   FUROSEMIDE (LASIX) 40 MG TABLET    Take 40 mg by mouth daily.     SIGNIFICANT DIAGNOSTIC EXAMS  12-14-14 ct of head: No evidence of acute intracranial abnormality. Small vessel ischemic changes with intracranial atherosclerosis.  12-14-14: chest x-ray: Elevation left hemidiaphragm. No edema or consolidation. Mild cardiac enlargement.  12-15-14: ct of abdomen and pelvis: No evidence of abdominal, pelvic, or retroperitoneal hematoma. Prominent stool-filled rectosigmoid colon with gaseous distention of the remainder of the colon suggesting obstipation with pseudo-obstruction. Mass versus prominent vascularity in the subcarinal region.   12-15-14: renal ultrasound: Study is moderately limited by patient body habitus but appears grossly normal.   12-15-14: ct of chest: 1. While the mediastinal structures are difficult to evaluate without IV contrast, there does not appear to be a subcarinal mass and the findings on comparison CT likely represent the left atrium. 2. Left basilar atelectasis.  12-15-14: EEG: This EEG is abnormal with mild localized nonspecific continuous slowing of cerebral activity. No evidence of an epileptic disorder was demonstrated. Lack of epileptiform activity during EEG recording does not rule out seizure disorder, however  04-29-15: right lower extremity doppler: neg for  dvt  06-01-15: No active cardiopulmonary disease.Right-sided PICC line with the tip projecting over the SVC     LABS REVIEWED:   10-10-15: wbc 7.7; hgb 11.2; hct 33.6; mcv 93.6; plt 283; glucose 132; bun 38; creat 2.30; k+ 4.4; na++137; liver normal albumin 3.6;  chol 149; ldl 74; trig 222; hdl 31; hgb a1c 6.2; tsh 10.013  12-12-15: chol 90; ldl 34; trig 121; hdl 32 12-26-15: tsh 3.07; vit D 18 12-28-15: uric acid 4.2; tsh 4.75; free  T4: 1.1; hgb a1c 6.3 12-30-15: uric acid 4.3; free T4: 1.0; chol 80; ldl 24; trig 164; hdl 23; hgb a1c 6.5  03-19-16: urine micro-albumin <0.2 04-18-16: glucose 122; bun 32; creat 1.89; k+ 4.1; na++136  04-20-16: wbc 7.4; hgb 9.5; hct 29.3; mcv 96.1 ;plt 297; glucose 135; bun 37; creat 1.93; k+ 4.6; na++ 138; liver normal albumin 3.6; hgb a1c 6.5; vit D 25     Review of Systems Constitutional: Negative for appetite change and fatigue.  HENT: Negative for congestion.   Respiratory: Negative for cough, chest tightness and shortness of breath.   Cardiovascular: Negative for chest pain and palpitations. No edema  Gastrointestinal: Negative for nausea, abdominal pain, diarrhea and constipation.  Musculoskeletal: no joint or muscle pain  Skin: Negative for pallor.  Neurological: Negative for dizziness.  Psychiatric/Behavioral: The patient is not nervous/anxious.     Physical Exam Constitutional: She is oriented to person, place, and time. No distress.  Morbidly obese   Eyes: Conjunctivae are normal.  Neck: Neck supple. No JVD present. No thyromegaly present.  Cardiovascular: Normal rate, regular rhythm and intact distal pulses.   Respiratory: Effort normal and breath sounds normal. No respiratory distress. She has no wheezes.  GI: Soft. Bowel sounds are normal. She exhibits no distension. There is no tenderness.  Musculoskeletal: She exhibits trace lower extremity edema   Able to move all extremities  Has limited range of motion due to obesity  .    Lymphadenopathy:    She has no cervical adenopathy.  Neurological: She is alert and oriented to person, place, and time.  Skin: Skin is warm and dry. She is not diaphoretic.  Right posterior thigh is hard; red warm and tender to touch  Psychiatric: She has a normal mood and affect.      ASSESSMENT/ PLAN:  1. Gout: will continue  allopurinol  300 mg daily; no recent flares   2. DVT: she has completed her xarelto therapy;  will monitor   3. Chronic pain: her pain is being managed with her current regimen will continue ultram 200 mg twice daily; elavil 100 mg nightly; zanaflex 4 mg every 8 hours as needed robaxin 500 mg every 8 hours as needed;   Will continue  neurontin 400 mg three times daily   4. Gerd: will continue protonix 40 mg daily  5. Constipation: colace twice daily; lactulose 10 gm daily miralax daily   6. Depression: she is emotionally stable; will continue prozac 40 mg daily and takes melatonin 3 mg nightly for sleep.   7. Diabetes: will continue tradjenta 5 mg daily her hgb a1c is 6.5   8. Hypertension:  Will continue lisinopril 2.5 mg daily;   9. Hypothyroidism: will continue  synthroid 50 mcg daily   tsh is 3.07 free T4: 1.0 .   10. Lower extremity edema: will increase demadex to 30 mg daily   11. Dyslipidemia: will continue fenofibrate 48 mg daily ldl 24; trig 164   12. Peripheral neuropathy: will continue  her neurontin  400 mg three times daily   13. Right posterior thigh cellulitis: will begin levaquin 500 mg daily for 3 weeks;  Will get a doppler study for dvt      MD is aware of resident's narcotic use and is in  agreement with current plan of care. We will attempt to wean resident as apropriate   Ok Edwards NP [facility]  Contact [phone] Monday through Friday 8am- 5pm  After hours call [phone]

## 2016-08-13 DIAGNOSIS — L03115 Cellulitis of right lower limb: Secondary | ICD-10-CM

## 2016-08-13 HISTORY — DX: Cellulitis of right lower limb: L03.115

## 2016-08-27 ENCOUNTER — Non-Acute Institutional Stay (SKILLED_NURSING_FACILITY): Payer: Medicare Other | Admitting: Adult Health

## 2016-08-27 ENCOUNTER — Encounter: Payer: Self-pay | Admitting: Adult Health

## 2016-08-27 DIAGNOSIS — L03115 Cellulitis of right lower limb: Secondary | ICD-10-CM | POA: Diagnosis not present

## 2016-08-27 DIAGNOSIS — N183 Chronic kidney disease, stage 3 unspecified: Secondary | ICD-10-CM

## 2016-08-27 DIAGNOSIS — E785 Hyperlipidemia, unspecified: Secondary | ICD-10-CM | POA: Diagnosis not present

## 2016-08-27 DIAGNOSIS — D649 Anemia, unspecified: Secondary | ICD-10-CM | POA: Diagnosis not present

## 2016-08-27 DIAGNOSIS — E034 Atrophy of thyroid (acquired): Secondary | ICD-10-CM | POA: Diagnosis not present

## 2016-08-27 DIAGNOSIS — E1169 Type 2 diabetes mellitus with other specified complication: Secondary | ICD-10-CM

## 2016-08-27 DIAGNOSIS — F324 Major depressive disorder, single episode, in partial remission: Secondary | ICD-10-CM | POA: Diagnosis not present

## 2016-08-27 DIAGNOSIS — M1A9XX0 Chronic gout, unspecified, without tophus (tophi): Secondary | ICD-10-CM | POA: Diagnosis not present

## 2016-08-27 DIAGNOSIS — I10 Essential (primary) hypertension: Secondary | ICD-10-CM

## 2016-08-27 DIAGNOSIS — E1143 Type 2 diabetes mellitus with diabetic autonomic (poly)neuropathy: Secondary | ICD-10-CM

## 2016-08-27 NOTE — Progress Notes (Signed)
Patient ID: Lauren Barber, female   DOB: July 30, 1956, 60 y.o.   MRN: [id number]   Location:   [facility] Room Number: 119-B Place of Service:  SNF (31)   CODE STATUS: Full Code  Allergies  Allergen Reactions  . Shellfish Allergy     Chief Complaint  Patient presents with  . Medical Management of Chronic Issues    Follow up    HPI:  She is a long term resident of this facility being seen for the management of her chronic illnesses. She is complaining of increased lower extremity edema in both legs. She states that the edema is causing her to have increased pain present. She denies any increased shortness of breath or chest pain.    Past Medical History:  Diagnosis Date  . Anemia   . Anxiety   . Atopic conjunctivitis   . Cardiomegaly   . Cellulitis    legs  . CVA (cerebral vascular accident) ([place])   . Degenerative, intervertebral disc    back, legs  . Depressive disorder    depressive disorder  . Diabetes mellitus without complication ([place])   . Endometriosis   . Gout    feet  . Headache(784.0)    otc meds prn  . History of blood transfusion [place] - unsure number of units transfused  . Hypertension   . Insomnia   . Neuromuscular disorder ([place])    diabetic neuropathy - feet  . Obesities, morbid ([place])   . Stroke [facility]) 2013  . Vaginal bleeding     Past Surgical History:  Procedure Laterality Date  . BIOPSY ENDOMETRIAL N/A 10 03 2013  . [place]   x 2  . DILATION AND CURETTAGE OF UTERUS  2013  . HYSTEROSCOPY W/D&C N/A 06/28/2014   Procedure: DILATATION AND CURETTAGE /HYSTEROSCOPY;  Surgeon: Lavonia Drafts, MD;  Location: [facility];  Service: Gynecology;  Laterality: N/A;  . TONSILLECTOMY    . WISDOM TOOTH EXTRACTION      Social History   Social History  . Marital status: Single    Spouse name: N/A  . Number of children: N/A  . Years of education: N/A   Occupational History  . Not on file.   Social  History Main Topics  . Smoking status: Former Smoker    Packs/day: 0.50    Years: 43.00    Types: Cigarettes    Quit date: 06/11/2012  . Smokeless tobacco: Never Used  . Alcohol use No  . Drug use: No  . Sexual activity: No   Other Topics Concern  . Not on file   Social History Narrative  . No narrative on file   Family History  Problem Relation Age of Onset  . Hypertension Mother   . Hypertension Father       VITAL SIGNS BP 122/76   Pulse 89   Temp 97.5 F (36.4 C) (Oral)   Resp 18   Ht 5\' 9"  (1.753 m)   Wt (!) 351 lb (159.2 kg)   SpO2 98%   BMI 51.83 kg/m   Patient's Medications  New Prescriptions   No medications on file  Previous Medications   ACETAMINOPHEN (TYLENOL) 325 MG TABLET    Take 650 mg by mouth every 6 (six) hours as needed for moderate pain, fever or headache.    ALLOPURINOL (ZYLOPRIM) 100 MG TABLET    Take 300 mg by mouth daily.   AMITRIPTYLINE (ELAVIL) 100 MG TABLET  Take 100 mg by mouth at bedtime.   DOCUSATE SODIUM (COLACE) 100 MG CAPSULE    Take 1 capsule (100 mg total) by mouth 2 (two) times daily.   EPINEPHRINE (EPIPEN) 0.3 MG/0.3 ML DEVI    Inject 0.3 mg into the muscle once.   FENOFIBRATE (TRICOR) 48 MG TABLET    Take 48 mg by mouth at bedtime.   FLUOXETINE (PROZAC) 40 MG CAPSULE    Take 40 mg by mouth daily.   GABAPENTIN (NEURONTIN) 400 MG CAPSULE    Take 400 mg by mouth 3 (three) times daily.   HYDROCORTISONE CREAM 1 %    Apply 1 application topically 2 (two) times daily.   LEVOTHYROXINE (SYNTHROID, LEVOTHROID) 50 MCG TABLET    Take 1 tablet (50 mcg total) by mouth daily.   LISINOPRIL (PRINIVIL,ZESTRIL) 2.5 MG TABLET    Take 2.5 mg by mouth daily.   MELATONIN 3 MG TABS    Take 3 mg by mouth at bedtime.   METHOCARBAMOL (ROBAXIN) 500 MG TABLET    Take 500 mg by mouth every 8 (eight) hours as needed for muscle spasms. Reported on 04/17/2016   PANTOPRAZOLE (PROTONIX) 40 MG TABLET    Take 1 tablet (40 mg total) by mouth daily.   POLYETHYLENE  GLYCOL (MIRALAX / GLYCOLAX) PACKET    Take 17 g by mouth daily.   TIZANIDINE (ZANAFLEX) 4 MG TABLET    Take 4 mg by mouth every 8 (eight) hours as needed for muscle spasms.   TORSEMIDE (DEMADEX) 20 MG TABLET    Take 20 mg by mouth daily.   TRAMADOL (ULTRAM) 50 MG TABLET    Take 4 tablets (200 mg total) by mouth every 12 (twelve) hours. For pain   VITAMIN D, ERGOCALCIFEROL, (DRISDOL) 50000 UNITS CAPS CAPSULE    Take 50,000 Units by mouth every 7 (seven) days. Every Wednesday  Modified Medications   No medications on file  Discontinued Medications   SODIUM CHLORIDE (OCEAN) 0.65 % SOLN NASAL SPRAY    Place 1 spray into both nostrils as needed for congestion.     SIGNIFICANT DIAGNOSTIC EXAMS  12-14-14 ct of head: No evidence of acute intracranial abnormality. Small vessel ischemic changes with intracranial atherosclerosis.  12-15-14: ct of abdomen and pelvis: No evidence of abdominal, pelvic, or retroperitoneal hematoma. Prominent stool-filled rectosigmoid colon with gaseous distention of the remainder of the colon suggesting obstipation with pseudo-obstruction. Mass versus prominent vascularity in the subcarinal region.   12-15-14: renal ultrasound: Study is moderately limited by patient body habitus but appears grossly normal.   12-15-14: ct of chest: 1. While the mediastinal structures are difficult to evaluate without IV contrast, there does not appear to be a subcarinal mass and the findings on comparison CT likely represent the left atrium. 2. Left basilar atelectasis.  12-15-14: EEG: This EEG is abnormal with mild localized nonspecific continuous slowing of cerebral activity. No evidence of an epileptic disorder was demonstrated. Lack of epileptiform activity during EEG recording does not rule out seizure disorder, however  04-29-15: right lower extremity doppler: neg for dvt  06-01-15: No active cardiopulmonary disease.Right-sided PICC line with the tip projecting over the SVC     LABS  REVIEWED:   10-10-15: wbc 7.7; hgb 11.2; hct 33.6; mcv 93.6; plt 283; glucose 132; bun 38; creat 2.30; k+ 4.4; na++137; liver normal albumin 3.6; chol 149; ldl 74; trig 222; hdl 31; hgb a1c 6.2; tsh 10.013  12-12-15: chol 90; ldl 34; trig 121; hdl 32 12-26-15: tsh  3.07; vit D 18 12-28-15: uric acid 4.2; tsh 4.75; free  T4: 1.1; hgb a1c 6.3 12-30-15: uric acid 4.3; free T4: 1.0; chol 80; ldl 24; trig 164; hdl 23; hgb a1c 6.5  03-19-16: urine micro-albumin <0.2 04-18-16: glucose 122; bun 32; creat 1.89; k+ 4.1; na++136  04-20-16: wbc 7.4; hgb 9.5; hct 29.3; mcv 96.1 ;plt 297; glucose 135; bun 37; creat 1.93; k+ 4.6; na++ 138; liver normal albumin 3.6; hgb a1c 6.5; vit D 25  07-13-16: glucose 1345; bun 30; creat 1.84; k+ 4.2 ;na++ 139    Review of Systems Constitutional: Negative for appetite change and fatigue.  HENT: Negative for congestion.   Respiratory: Negative for cough, chest tightness and shortness of breath.   Cardiovascular: Negative for chest pain and palpitations. No edema  Gastrointestinal: Negative for nausea, abdominal pain, diarrhea and constipation.  Musculoskeletal: no joint or muscle pain  Skin: Negative for pallor.  Neurological: Negative for dizziness.  Psychiatric/Behavioral: The patient is not nervous/anxious.     Physical Exam Constitutional: She is oriented to person, place, and time. No distress.  Morbidly obese   Eyes: Conjunctivae are normal.  Neck: Neck supple. No JVD present. No thyromegaly present.  Cardiovascular: Normal rate, regular rhythm and intact distal pulses.   Respiratory: Effort normal and breath sounds normal. No respiratory distress. She has no wheezes.  GI: Soft. Bowel sounds are normal. She exhibits no distension. There is no tenderness.  Musculoskeletal: She exhibits 2+ bilateral lower extremity edema   Able to move all extremities  Has limited range of motion due to obesity  .  Lymphadenopathy:    She has no cervical adenopathy.  Neurological:  She is alert and oriented to person, place, and time.  Skin: Skin is warm and dry. She is not diaphoretic.   Psychiatric: She has a normal mood and affect.      ASSESSMENT/ PLAN:  1. Gout: will continue  allopurinol  300 mg daily; no recent flares   2. Chronic pain: her pain is being managed with her current regimen will continue ultram 200 mg twice daily; elavil 100 mg nightly; zanaflex 4 mg every 8 hours as needed robaxin 500 mg every 8 hours as needed;   Will continue  neurontin 400 mg three times daily   3. Gerd: will continue protonix 40 mg daily  4. Constipation: colace twice daily; lactulose 10 gm daily miralax daily   5. Depression: she is emotionally stable; will continue prozac 40 mg daily and takes melatonin 3 mg nightly for sleep.   6. Diabetes: will continue tradjenta 5 mg daily her hgb a1c is 6.5   7. Hypertension:  Will continue lisinopril 2.5 mg daily;   8. Hypothyroidism: will continue  synthroid 50 mcg daily   tsh is 3.07 free T4: 1.0 .   9. Lower extremity edema: will increase demadex to 40 mg daily will begin aldactone 25 mg daily   10. Dyslipidemia: will continue fenofibrate 48 mg daily ldl 24; trig 164   11. Peripheral neuropathy: will continue  her neurontin  400 mg three times daily   12. CKD: stage III: bun/creat 30/1.84; will monitor    In 2 weeks will check cbc; cmp lipids; hgb a1c tsh    MD is aware of resident's narcotic use and is in agreement with current plan of care. We will attempt to wean resident as apropriate   Ok Edwards NP [facility]  Contact [phone] Monday through Friday 8am- 5pm  After hours call  [phone]  

## 2016-09-12 LAB — HEPATIC FUNCTION PANEL
ALK PHOS: 87 U/L (ref 25–125)
ALT: 21 U/L (ref 7–35)
AST: 26 U/L (ref 13–35)
BILIRUBIN, TOTAL: 0.3 mg/dL

## 2016-09-12 LAB — BASIC METABOLIC PANEL
BUN: 41 mg/dL — AB (ref 4–21)
Creatinine: 2.7 mg/dL — AB (ref 0.5–1.1)
GLUCOSE: 142 mg/dL
Potassium: 4.7 mmol/L (ref 3.4–5.3)
SODIUM: 138 mmol/L (ref 137–147)

## 2016-09-12 LAB — LIPID PANEL
Cholesterol: 145 mg/dL (ref 0–200)
HDL: 40 mg/dL (ref 35–70)
LDL CALC: 74 mg/dL
Triglycerides: 157 mg/dL (ref 40–160)

## 2016-09-12 LAB — TSH: TSH: 4.2 u[IU]/mL (ref 0.41–5.90)

## 2016-09-12 LAB — CBC AND DIFFERENTIAL
HEMATOCRIT: 32 % — AB (ref 36–46)
Hemoglobin: 10 g/dL — AB (ref 12.0–16.0)
Platelets: 274 10*3/uL (ref 150–399)
WBC: 9.2 10*3/mL

## 2016-09-27 ENCOUNTER — Encounter: Payer: Self-pay | Admitting: Adult Health

## 2016-09-27 ENCOUNTER — Non-Acute Institutional Stay (SKILLED_NURSING_FACILITY): Payer: Medicare Other | Admitting: Adult Health

## 2016-09-27 DIAGNOSIS — E034 Atrophy of thyroid (acquired): Secondary | ICD-10-CM | POA: Diagnosis not present

## 2016-09-27 DIAGNOSIS — R600 Localized edema: Secondary | ICD-10-CM | POA: Diagnosis not present

## 2016-09-27 DIAGNOSIS — N183 Chronic kidney disease, stage 3 unspecified: Secondary | ICD-10-CM

## 2016-09-27 DIAGNOSIS — G8929 Other chronic pain: Secondary | ICD-10-CM | POA: Diagnosis not present

## 2016-09-27 DIAGNOSIS — M1A9XX0 Chronic gout, unspecified, without tophus (tophi): Secondary | ICD-10-CM

## 2016-09-27 DIAGNOSIS — E1169 Type 2 diabetes mellitus with other specified complication: Secondary | ICD-10-CM | POA: Diagnosis not present

## 2016-09-27 DIAGNOSIS — E1143 Type 2 diabetes mellitus with diabetic autonomic (poly)neuropathy: Secondary | ICD-10-CM

## 2016-09-27 DIAGNOSIS — E785 Hyperlipidemia, unspecified: Secondary | ICD-10-CM | POA: Diagnosis not present

## 2016-09-27 NOTE — Progress Notes (Signed)
Patient ID: Lauren Barber, female   DOB: 1956/04/23, 60 y.o.   MRN: [id number]   Location:   [facility] Room Number: 119-B Place of Service:  SNF (31)   CODE STATUS: Full Code  Allergies  Allergen Reactions  . Shellfish Allergy     Chief Complaint  Patient presents with  . Medical Management of Chronic Issues    Follow up    HPI:  She is a long term resident of this facility being seen for the management of her chronic illnesses. Overall her is status is stable. She is not voicing any complaints at this time. She does spend all of her time in bed. There are no nursing concerns at this time.    Past Medical History:  Diagnosis Date  . Anemia   . Anxiety   . Atopic conjunctivitis   . Cardiomegaly   . Cellulitis    legs  . CVA (cerebral vascular accident) ([place])   . Degenerative, intervertebral disc    back, legs  . Depressive disorder    depressive disorder  . Diabetes mellitus without complication ([place])   . Endometriosis   . Gout    feet  . Headache(784.0)    otc meds prn  . History of blood transfusion [place] - unsure number of units transfused  . Hypertension   . Insomnia   . Neuromuscular disorder ([place])    diabetic neuropathy - feet  . Obesities, morbid ([place])   . Stroke [facility]) 2013  . Vaginal bleeding     Past Surgical History:  Procedure Laterality Date  . BIOPSY ENDOMETRIAL N/A 10 03 2013  . [place]   x 2  . DILATION AND CURETTAGE OF UTERUS  2013  . HYSTEROSCOPY W/D&C N/A 06/28/2014   Procedure: DILATATION AND CURETTAGE /HYSTEROSCOPY;  Surgeon: Lavonia Drafts, MD;  Location: [facility];  Service: Gynecology;  Laterality: N/A;  . TONSILLECTOMY    . WISDOM TOOTH EXTRACTION      Social History   Social History  . Marital status: Single    Spouse name: N/A  . Number of children: N/A  . Years of education: N/A   Occupational History  . Not on file.   Social History Main Topics  . Smoking  status: Former Smoker    Packs/day: 0.50    Years: 43.00    Types: Cigarettes    Quit date: 06/11/2012  . Smokeless tobacco: Never Used  . Alcohol use No  . Drug use: No  . Sexual activity: No   Other Topics Concern  . Not on file   Social History Narrative  . No narrative on file   Family History  Problem Relation Age of Onset  . Hypertension Mother   . Hypertension Father       VITAL SIGNS BP 128/72   Pulse 73   Temp 98.2 F (36.8 C) (Oral)   Resp 20   Ht 5\' 9"  (1.753 m)   Wt (!) 351 lb (159.2 kg)   SpO2 98%   BMI 51.83 kg/m   Patient's Medications  New Prescriptions   No medications on file  Previous Medications   ACETAMINOPHEN (TYLENOL) 325 MG TABLET    Take 650 mg by mouth every 6 (six) hours as needed for moderate pain, fever or headache.    ALLOPURINOL (ZYLOPRIM) 100 MG TABLET    Take 300 mg by mouth daily.   AMITRIPTYLINE (ELAVIL) 100 MG TABLET    Take  100 mg by mouth at bedtime.   DOCUSATE SODIUM (COLACE) 100 MG CAPSULE    Take 1 capsule (100 mg total) by mouth 2 (two) times daily.   EPINEPHRINE (EPIPEN) 0.3 MG/0.3 ML DEVI    Inject 0.3 mg into the muscle once.   FENOFIBRATE (TRICOR) 48 MG TABLET    Take 48 mg by mouth at bedtime.   FLUOXETINE (PROZAC) 40 MG CAPSULE    Take 40 mg by mouth daily.   GABAPENTIN (NEURONTIN) 400 MG CAPSULE    Take 400 mg by mouth 3 (three) times daily.   HYDROCORTISONE CREAM 1 %    Apply 1 application topically 2 (two) times daily.   LEVOTHYROXINE (SYNTHROID, LEVOTHROID) 50 MCG TABLET    Take 1 tablet (50 mcg total) by mouth daily.   LISINOPRIL (PRINIVIL,ZESTRIL) 2.5 MG TABLET    Take 2.5 mg by mouth daily.   MELATONIN 3 MG TABS    Take 3 mg by mouth at bedtime.   METHOCARBAMOL (ROBAXIN) 500 MG TABLET    Take 500 mg by mouth every 8 (eight) hours as needed for muscle spasms. Reported on 04/17/2016   PANTOPRAZOLE (PROTONIX) 40 MG TABLET    Take 1 tablet (40 mg total) by mouth daily.   POLYETHYLENE GLYCOL (MIRALAX / GLYCOLAX)  PACKET    Take 17 g by mouth daily.   SPIRONOLACTONE (ALDACTONE) 25 MG TABLET    Take 25 mg by mouth daily.   TIZANIDINE (ZANAFLEX) 4 MG TABLET    Take 4 mg by mouth every 8 (eight) hours as needed for muscle spasms.   TORSEMIDE (DEMADEX) 20 MG TABLET    Take 20 mg by mouth daily.   TRAMADOL (ULTRAM) 50 MG TABLET    Take 4 tablets (200 mg total) by mouth every 12 (twelve) hours. For pain   VITAMIN D, ERGOCALCIFEROL, (DRISDOL) 50000 UNITS CAPS CAPSULE    Take 50,000 Units by mouth every 7 (seven) days. Every Wednesday  Modified Medications   No medications on file  Discontinued Medications   No medications on file     SIGNIFICANT DIAGNOSTIC EXAMS  12-14-14 ct of head: No evidence of acute intracranial abnormality. Small vessel ischemic changes with intracranial atherosclerosis.  12-15-14: ct of abdomen and pelvis: No evidence of abdominal, pelvic, or retroperitoneal hematoma. Prominent stool-filled rectosigmoid colon with gaseous distention of the remainder of the colon suggesting obstipation with pseudo-obstruction. Mass versus prominent vascularity in the subcarinal region.   12-15-14: renal ultrasound: Study is moderately limited by patient body habitus but appears grossly normal.   12-15-14: ct of chest: 1. While the mediastinal structures are difficult to evaluate without IV contrast, there does not appear to be a subcarinal mass and the findings on comparison CT likely represent the left atrium. 2. Left basilar atelectasis.  12-15-14: EEG: This EEG is abnormal with mild localized nonspecific continuous slowing of cerebral activity. No evidence of an epileptic disorder was demonstrated. Lack of epileptiform activity during EEG recording does not rule out seizure disorder, however  04-29-15: right lower extremity doppler: neg for dvt  06-01-15: No active cardiopulmonary disease.Right-sided PICC line with the tip projecting over the SVC     LABS REVIEWED:   10-10-15: wbc 7.7; hgb 11.2;  hct 33.6; mcv 93.6; plt 283; glucose 132; bun 38; creat 2.30; k+ 4.4; na++137; liver normal albumin 3.6; chol 149; ldl 74; trig 222; hdl 31; hgb a1c 6.2; tsh 10.013  12-12-15: chol 90; ldl 34; trig 121; hdl 32 12-26-15: tsh 3.07; vit  D 18 12-28-15: uric acid 4.2; tsh 4.75; free  T4: 1.1; hgb a1c 6.3 12-30-15: uric acid 4.3; free T4: 1.0; chol 80; ldl 24; trig 164; hdl 23; hgb a1c 6.5  03-19-16: urine micro-albumin <0.2 04-18-16: glucose 122; bun 32; creat 1.89; k+ 4.1; na++136  04-20-16: wbc 7.4; hgb 9.5; hct 29.3; mcv 96.1 ;plt 297; glucose 135; bun 37; creat 1.93; k+ 4.6; na++ 138; liver normal albumin 3.6; hgb a1c 6.5; vit D 25  07-13-16: glucose 1345; bun 30; creat 1.84; k+ 4.2 ;na++ 139  09-12-16: wbc 9.2; hgb 10.0; hct 32.2; mcv 103.0; plt 274; glucose 142; bun 40.9; creat 2.66; k+ 4.7; na++ 138; liver normal albumin 4.1; tsh 4.20    Review of Systems Constitutional: Negative for appetite change and fatigue.  HENT: Negative for congestion.   Respiratory: Negative for cough, chest tightness and shortness of breath.   Cardiovascular: Negative for chest pain and palpitations. No edema  Gastrointestinal: Negative for nausea, abdominal pain, diarrhea and constipation.  Musculoskeletal: no joint or muscle pain  Skin: Negative for pallor.  Neurological: Negative for dizziness.  Psychiatric/Behavioral: The patient is not nervous/anxious.     Physical Exam Constitutional: She is oriented to person, place, and time. No distress.  Morbidly obese   Eyes: Conjunctivae are normal.  Neck: Neck supple. No JVD present. No thyromegaly present.  Cardiovascular: Normal rate, regular rhythm and intact distal pulses.   Respiratory: Effort normal and breath sounds normal. No respiratory distress. She has no wheezes.  GI: Soft. Bowel sounds are normal. She exhibits no distension. There is no tenderness.  Musculoskeletal: She exhibits 2+ bilateral lower extremity edema   Able to move all extremities  Has limited  range of motion due to obesity  .  Lymphadenopathy:    She has no cervical adenopathy.  Neurological: She is alert and oriented to person, place, and time.  Skin: Skin is warm and dry. She is not diaphoretic.   Psychiatric: She has a normal mood and affect.      ASSESSMENT/ PLAN:  1. Gout: will continue  allopurinol  300 mg daily; no recent flares   2. Chronic pain: her pain is being managed with her current regimen will continue ultram 200 mg twice daily; elavil 100 mg nightly; zanaflex 4 mg every 8 hours as needed robaxin 500 mg every 8 hours as needed;   Will continue  neurontin 400 mg three times daily   3. Gerd: will continue protonix 40 mg daily  4. Constipation: colace twice daily miralax daily   5. Depression: she is emotionally stable; will continue prozac 40 mg daily and takes melatonin 3 mg nightly for sleep.   6. Diabetes: will continue tradjenta 5 mg daily her hgb a1c is 6.5   7. Hypertension:  Will continue lisinopril 2.5 mg daily;   8. Hypothyroidism: will continue  synthroid 50 mcg daily   tsh is 4.20.   9. Lower extremity edema: will continue  demadex 20 mg daily and aldactone 25 mg daily   10. Dyslipidemia: will continue fenofibrate 48 mg daily ldl 24; trig 164   11. Peripheral neuropathy: will continue  her neurontin  400 mg three times daily   12. CKD: stage III: bun/creat 40.9/2.66; will monitor     MD is aware of resident's narcotic use and is in agreement with current plan of care. We will attempt to wean resident as apropriate   Ok Edwards NP [facility]  Contact [phone] Monday through Friday 8am- 5pm  After hours call [phone]

## 2016-10-04 ENCOUNTER — Non-Acute Institutional Stay (SKILLED_NURSING_FACILITY): Payer: Medicare Other | Admitting: Adult Health

## 2016-10-04 ENCOUNTER — Encounter: Payer: Self-pay | Admitting: Adult Health

## 2016-10-04 DIAGNOSIS — L600 Ingrowing nail: Secondary | ICD-10-CM

## 2016-10-04 NOTE — Progress Notes (Signed)
Patient ID: Lauren Barber, female   DOB: 11-26-1955, 60 y.o.   MRN: [id number]   Location:   [facility] Room Number: 119-B Place of Service:  SNF (31)   CODE STATUS: Full Code  Allergies  Allergen Reactions  . Shellfish Allergy     Chief Complaint  Patient presents with  . Acute Visit    HPI:  Her right great toe is ingrown; inflamed with small amount of purulent drainage present. There are no reports of fever present. She does have pain present in her toes.    Past Medical History:  Diagnosis Date  . Anemia   . Anxiety   . Atopic conjunctivitis   . Cardiomegaly   . Cellulitis    legs  . CVA (cerebral vascular accident) ([place])   . Degenerative, intervertebral disc    back, legs  . Depressive disorder    depressive disorder  . Diabetes mellitus without complication ([place])   . Endometriosis   . Gout    feet  . Headache(784.0)    otc meds prn  . History of blood transfusion [place] - unsure number of units transfused  . Hypertension   . Insomnia   . Neuromuscular disorder ([place])    diabetic neuropathy - feet  . Obesities, morbid ([place])   . Stroke [facility]) 2013  . Vaginal bleeding     Past Surgical History:  Procedure Laterality Date  . BIOPSY ENDOMETRIAL N/A 10 03 2013  . [place]   x 2  . DILATION AND CURETTAGE OF UTERUS  2013  . HYSTEROSCOPY W/D&C N/A 06/28/2014   Procedure: DILATATION AND CURETTAGE /HYSTEROSCOPY;  Surgeon: Lavonia Drafts, MD;  Location: [facility];  Service: Gynecology;  Laterality: N/A;  . TONSILLECTOMY    . WISDOM TOOTH EXTRACTION      Social History   Social History  . Marital status: Single    Spouse name: N/A  . Number of children: N/A  . Years of education: N/A   Occupational History  . Not on file.   Social History Main Topics  . Smoking status: Former Smoker    Packs/day: 0.50    Years: 43.00    Types: Cigarettes    Quit date: 06/11/2012  . Smokeless tobacco: Never Used    . Alcohol use No  . Drug use: No  . Sexual activity: No   Other Topics Concern  . Not on file   Social History Narrative  . No narrative on file   Family History  Problem Relation Age of Onset  . Hypertension Mother   . Hypertension Father       VITAL SIGNS BP (!) 84/59   Pulse 88   Temp 98.1 F (36.7 C) (Oral)   Resp 20   Ht 5\' 9"  (1.753 m)   Wt (!) 351 lb (159.2 kg)   SpO2 98%   BMI 51.83 kg/m   Patient's Medications  New Prescriptions   No medications on file  Previous Medications   ACETAMINOPHEN (TYLENOL) 325 MG TABLET    Take 650 mg by mouth every 6 (six) hours as needed for moderate pain, fever or headache.    ALLOPURINOL (ZYLOPRIM) 100 MG TABLET    Take 300 mg by mouth daily.   AMITRIPTYLINE (ELAVIL) 100 MG TABLET    Take 100 mg by mouth at bedtime.   DOCUSATE SODIUM (COLACE) 100 MG CAPSULE    Take 1 capsule (100 mg total) by mouth 2 (two)  times daily.   EPINEPHRINE (EPIPEN) 0.3 MG/0.3 ML DEVI    Inject 0.3 mg into the muscle once.   FENOFIBRATE (TRICOR) 48 MG TABLET    Take 48 mg by mouth at bedtime.   FLUOXETINE (PROZAC) 40 MG CAPSULE    Take 40 mg by mouth daily.   GABAPENTIN (NEURONTIN) 400 MG CAPSULE    Take 400 mg by mouth 3 (three) times daily.   HYDROCORTISONE CREAM 1 %    Apply 1 application topically 2 (two) times daily.   LEVOTHYROXINE (SYNTHROID, LEVOTHROID) 50 MCG TABLET    Take 1 tablet (50 mcg total) by mouth daily.   LISINOPRIL (PRINIVIL,ZESTRIL) 2.5 MG TABLET    Take 2.5 mg by mouth daily.   MELATONIN 3 MG TABS    Take 3 mg by mouth at bedtime.   METHOCARBAMOL (ROBAXIN) 500 MG TABLET    Take 500 mg by mouth every 8 (eight) hours as needed for muscle spasms. Reported on 04/17/2016   PANTOPRAZOLE (PROTONIX) 40 MG TABLET    Take 1 tablet (40 mg total) by mouth daily.   POLYETHYLENE GLYCOL (MIRALAX / GLYCOLAX) PACKET    Take 17 g by mouth daily.   SPIRONOLACTONE (ALDACTONE) 25 MG TABLET    Take 25 mg by mouth daily.   TIZANIDINE (ZANAFLEX) 4 MG  TABLET    Take 4 mg by mouth every 8 (eight) hours as needed for muscle spasms.   TORSEMIDE (DEMADEX) 20 MG TABLET    Take 20 mg by mouth daily.   TRAMADOL (ULTRAM) 50 MG TABLET    Take 4 tablets (200 mg total) by mouth every 12 (twelve) hours. For pain   VITAMIN D, ERGOCALCIFEROL, (DRISDOL) 50000 UNITS CAPS CAPSULE    Take 50,000 Units by mouth every 7 (seven) days. Every Wednesday  Modified Medications   No medications on file  Discontinued Medications   No medications on file     SIGNIFICANT DIAGNOSTIC EXAMS  12-14-14 ct of head: No evidence of acute intracranial abnormality. Small vessel ischemic changes with intracranial atherosclerosis.  12-15-14: ct of abdomen and pelvis: No evidence of abdominal, pelvic, or retroperitoneal hematoma. Prominent stool-filled rectosigmoid colon with gaseous distention of the remainder of the colon suggesting obstipation with pseudo-obstruction. Mass versus prominent vascularity in the subcarinal region.   12-15-14: renal ultrasound: Study is moderately limited by patient body habitus but appears grossly normal.   12-15-14: ct of chest: 1. While the mediastinal structures are difficult to evaluate without IV contrast, there does not appear to be a subcarinal mass and the findings on comparison CT likely represent the left atrium. 2. Left basilar atelectasis.  12-15-14: EEG: This EEG is abnormal with mild localized nonspecific continuous slowing of cerebral activity. No evidence of an epileptic disorder was demonstrated. Lack of epileptiform activity during EEG recording does not rule out seizure disorder, however  04-29-15: right lower extremity doppler: neg for dvt  06-01-15: No active cardiopulmonary disease.Right-sided PICC line with the tip projecting over the SVC     LABS REVIEWED:   10-10-15: wbc 7.7; hgb 11.2; hct 33.6; mcv 93.6; plt 283; glucose 132; bun 38; creat 2.30; k+ 4.4; na++137; liver normal albumin 3.6; chol 149; ldl 74; trig 222; hdl  31; hgb a1c 6.2; tsh 10.013  12-12-15: chol 90; ldl 34; trig 121; hdl 32 12-26-15: tsh 3.07; vit D 18 12-28-15: uric acid 4.2; tsh 4.75; free  T4: 1.1; hgb a1c 6.3 12-30-15: uric acid 4.3; free T4: 1.0; chol 80; ldl 24; trig  164; hdl 23; hgb a1c 6.5  03-19-16: urine micro-albumin <0.2 04-18-16: glucose 122; bun 32; creat 1.89; k+ 4.1; na++136  04-20-16: wbc 7.4; hgb 9.5; hct 29.3; mcv 96.1 ;plt 297; glucose 135; bun 37; creat 1.93; k+ 4.6; na++ 138; liver normal albumin 3.6; hgb a1c 6.5; vit D 25  07-13-16: glucose 1345; bun 30; creat 1.84; k+ 4.2 ;na++ 139    Review of Systems Constitutional: Negative for appetite change and fatigue.  HENT: Negative for congestion.   Respiratory: Negative for cough, chest tightness and shortness of breath.   Cardiovascular: Negative for chest pain and palpitations. No edema  Gastrointestinal: Negative for nausea, abdominal pain, diarrhea and constipation.  Musculoskeletal: no joint or muscle pain  Skin:  Right great toe is ingrown and inflamed  Neurological: Negative for dizziness.  Psychiatric/Behavioral: The patient is not nervous/anxious.     Physical Exam Constitutional: She is oriented to person, place, and time. No distress.  Morbidly obese   Eyes: Conjunctivae are normal.  Neck: Neck supple. No JVD present. No thyromegaly present.  Cardiovascular: Normal rate, regular rhythm and intact distal pulses.   Respiratory: Effort normal and breath sounds normal. No respiratory distress. She has no wheezes.  GI: Soft. Bowel sounds are normal. She exhibits no distension. There is no tenderness.  Musculoskeletal: She exhibits 2+ bilateral lower extremity edema   Able to move all extremities  Has limited range of motion due to obesity  .  Lymphadenopathy:    She has no cervical adenopathy.  Neurological: She is alert and oriented to person, place, and time.  Skin: right great toe is ingrown; inflamed; with small amount of purulent drainage present.      Psychiatric: She has a normal mood and affect.      ASSESSMENT/ PLAN:  1. Right ingrown toe: will being doxycycline 100 mg twice daily for 2 weeks with florastor; will set up a podiatry consult    MD is aware of resident's narcotic use and is in agreement with current plan of care. We will attempt to wean resident as appropriate.     Ok Edwards NP [facility]  Contact [phone] Monday through Friday 8am- 5pm  After hours call [phone]

## 2016-10-22 ENCOUNTER — Encounter: Payer: Self-pay | Admitting: Adult Health

## 2016-10-24 DIAGNOSIS — L600 Ingrowing nail: Secondary | ICD-10-CM | POA: Insufficient documentation

## 2016-10-25 ENCOUNTER — Encounter: Payer: Self-pay | Admitting: Adult Health

## 2016-10-25 ENCOUNTER — Non-Acute Institutional Stay (SKILLED_NURSING_FACILITY): Payer: Medicare Other | Admitting: Adult Health

## 2016-10-25 DIAGNOSIS — M109 Gout, unspecified: Secondary | ICD-10-CM | POA: Diagnosis not present

## 2016-10-25 LAB — URIC ACID
URIC ACID: 12.7
VIT D 25 HYDROXY: 25.94

## 2016-10-25 NOTE — Progress Notes (Signed)
Patient ID: Lauren Barber, female   DOB: [birth year], 58 y.o.   MRN: [id number]   Location:   [facility] Room Number: 119-B Place of Service:  SNF (31)   CODE STATUS: Full Code  Allergies  Allergen Reactions  . Shellfish Allergy     Chief Complaint  Patient presents with  . Acute Visit    Gout    HPI:  Her uric acid has returned at 12.7. She is complaining of joint pain "all over". She is taking allopurinol 300 mg daily. She will need her medications adjusted. There are no reports of fever present.   Past Medical History:  Diagnosis Date  . Anemia   . Anxiety   . Atopic conjunctivitis   . Cardiomegaly   . Cellulitis    legs  . CVA (cerebral vascular accident) ([place])   . Degenerative, intervertebral disc    back, legs  . Depressive disorder    depressive disorder  . Diabetes mellitus without complication ([place])   . Endometriosis   . Essential hypertension 04/12/2007   Qualifier: Diagnosis of  By: Jobe Igo MD, Shanon Brow    . Gout    feet  . Headache(784.0)    otc meds prn  . History of blood transfusion [place] - unsure number of units transfused  . Hypertension   . Insomnia   . Neuromuscular disorder ([place])    diabetic neuropathy - feet  . Obesities, morbid ([place])   . Stroke [facility]) 2013  . Vaginal bleeding     Past Surgical History:  Procedure Laterality Date  . BIOPSY ENDOMETRIAL N/A 10 03 2013  . [place]   x 2  . DILATION AND CURETTAGE OF UTERUS  2013  . HYSTEROSCOPY W/D&C N/A 06/28/2014   Procedure: DILATATION AND CURETTAGE /HYSTEROSCOPY;  Surgeon: Lavonia Drafts, MD;  Location: [facility];  Service: Gynecology;  Laterality: N/A;  . TONSILLECTOMY    . WISDOM TOOTH EXTRACTION      Social History   Social History  . Marital status: Single    Spouse name: N/A  . Number of children: N/A  . Years of education: N/A   Occupational History  . Not on file.   Social History Main Topics  . Smoking status: Former  Smoker    Packs/day: 0.50    Years: 43.00    Types: Cigarettes    Quit date: 06/11/2012  . Smokeless tobacco: Never Used  . Alcohol use No  . Drug use: No  . Sexual activity: No   Other Topics Concern  . Not on file   Social History Narrative  . No narrative on file   Family History  Problem Relation Age of Onset  . Hypertension Mother   . Hypertension Father       VITAL SIGNS BP 123/71   Pulse 64   Temp 97.5 F (36.4 C) (Oral)   Resp (!) 64   Ht 5\' 9"  (1.753 m)   Wt (!) 351 lb (159.2 kg)   SpO2 97%   BMI 51.83 kg/m   Patient's Medications  New Prescriptions   No medications on file  Previous Medications   ACETAMINOPHEN (TYLENOL) 325 MG TABLET    Take 650 mg by mouth every 6 (six) hours as needed for moderate pain, fever or headache.    ALLOPURINOL (ZYLOPRIM) 100 MG TABLET    Take 300 mg by mouth daily.   AMITRIPTYLINE (ELAVIL) 100 MG TABLET    Take 100 mg  by mouth at bedtime.   DIPHENHYDRAMINE (BENADRYL) 25 MG TABLET    Take 25 mg by mouth every 6 (six) hours as needed.   DOCUSATE SODIUM (COLACE) 100 MG CAPSULE    Take 1 capsule (100 mg total) by mouth 2 (two) times daily.   EPINEPHRINE (EPIPEN) 0.3 MG/0.3 ML DEVI    Inject 0.3 mg into the muscle once.   FENOFIBRATE (TRICOR) 48 MG TABLET    Take 48 mg by mouth at bedtime.   FLUOXETINE (PROZAC) 40 MG CAPSULE    Take 40 mg by mouth daily.   GABAPENTIN (NEURONTIN) 400 MG CAPSULE    Take 400 mg by mouth 3 (three) times daily.   HYDROCORTISONE CREAM 1 %    Apply 1 application topically 2 (two) times daily.   LEVOTHYROXINE (SYNTHROID, LEVOTHROID) 50 MCG TABLET    Take 1 tablet (50 mcg total) by mouth daily.   LISINOPRIL (PRINIVIL,ZESTRIL) 2.5 MG TABLET    Take 2.5 mg by mouth daily.   MELATONIN 3 MG TABS    Take 3 mg by mouth at bedtime.   METHOCARBAMOL (ROBAXIN) 500 MG TABLET    Take 500 mg by mouth every 8 (eight) hours as needed for muscle spasms. Reported on 04/17/2016   PANTOPRAZOLE (PROTONIX) 40 MG TABLET    Take  1 tablet (40 mg total) by mouth daily.   POLYETHYLENE GLYCOL (MIRALAX / GLYCOLAX) PACKET    Take 17 g by mouth daily.   POLYVINYL ALCOHOL (LIQUIFILM TEARS) 1.4 % OPHTHALMIC SOLUTION    Place 1 drop into both eyes every 4 (four) hours as needed for dry eyes.   SPIRONOLACTONE (ALDACTONE) 25 MG TABLET    Take 25 mg by mouth daily.   TIZANIDINE (ZANAFLEX) 4 MG TABLET    Take 4 mg by mouth every 8 (eight) hours as needed for muscle spasms.   TORSEMIDE (DEMADEX) 20 MG TABLET    Take 40 mg by mouth daily.    TRAMADOL (ULTRAM-ER) 100 MG 24 HR TABLET    Take 200 mg by mouth 2 (two) times daily.   VITAMIN D, ERGOCALCIFEROL, (DRISDOL) 50000 UNITS CAPS CAPSULE    Take 50,000 Units by mouth every 7 (seven) days. Every Wednesday  Modified Medications   No medications on file  Discontinued Medications   TRAMADOL (ULTRAM) 50 MG TABLET    Take 4 tablets (200 mg total) by mouth every 12 (twelve) hours. For pain     SIGNIFICANT DIAGNOSTIC EXAMS  12-14-14 ct of head: No evidence of acute intracranial abnormality. Small vessel ischemic changes with intracranial atherosclerosis.  12-15-14: ct of abdomen and pelvis: No evidence of abdominal, pelvic, or retroperitoneal hematoma. Prominent stool-filled rectosigmoid colon with gaseous distention of the remainder of the colon suggesting obstipation with pseudo-obstruction. Mass versus prominent vascularity in the subcarinal region.   12-15-14: renal ultrasound: Study is moderately limited by patient body habitus but appears grossly normal.   12-15-14: ct of chest: 1. While the mediastinal structures are difficult to evaluate without IV contrast, there does not appear to be a subcarinal mass and the findings on comparison CT likely represent the left atrium. 2. Left basilar atelectasis.  12-15-14: EEG: This EEG is abnormal with mild localized nonspecific continuous slowing of cerebral activity. No evidence of an epileptic disorder was demonstrated. Lack of epileptiform  activity during EEG recording does not rule out seizure disorder, however  04-29-15: right lower extremity doppler: neg for dvt  06-01-15: No active cardiopulmonary disease.Right-sided PICC line with the tip  projecting over the SVC     LABS REVIEWED:   12-12-15: chol 90; ldl 34; trig 121; hdl 32 12-26-15: tsh 3.07; vit D 18 12-28-15: uric acid 4.2; tsh 4.75; free  T4: 1.1; hgb a1c 6.3 12-30-15: uric acid 4.3; free T4: 1.0; chol 80; ldl 24; trig 164; hdl 23; hgb a1c 6.5  03-19-16: urine micro-albumin <0.2 04-18-16: glucose 122; bun 32; creat 1.89; k+ 4.1; na++136  04-20-16: wbc 7.4; hgb 9.5; hct 29.3; mcv 96.1 ;plt 297; glucose 135; bun 37; creat 1.93; k+ 4.6; na++ 138; liver normal albumin 3.6; hgb a1c 6.5; vit D 25  07-13-16: glucose 1345; bun 30; creat 1.84; k+ 4.2 ;na++ 139  10-24-16: uric acid 12.7; vit D 25.94    Review of Systems Constitutional: Negative for appetite change and fatigue.  HENT: Negative for congestion.   Respiratory: Negative for cough, chest tightness and shortness of breath.   Cardiovascular: Negative for chest pain and palpitations. No edema  Gastrointestinal: Negative for nausea, abdominal pain, diarrhea and constipation.  Musculoskeletal: no joint or muscle pain  Skin:  Right great toe is ingrown and inflamed  Neurological: Negative for dizziness.  Psychiatric/Behavioral: The patient is not nervous/anxious.     Physical Exam Constitutional: She is oriented to person, place, and time. No distress.  Morbidly obese   Eyes: Conjunctivae are normal.  Neck: Neck supple. No JVD present. No thyromegaly present.  Cardiovascular: Normal rate, regular rhythm and intact distal pulses.   Respiratory: Effort normal and breath sounds normal. No respiratory distress. She has no wheezes.  GI: Soft. Bowel sounds are normal. She exhibits no distension. There is no tenderness.  Musculoskeletal: She exhibits 1+ bilateral lower extremity edema   Able to move all extremities  Has  limited range of motion due to obesity  .  Lymphadenopathy:    She has no cervical adenopathy.  Neurological: She is alert and oriented to person, place, and time.  Skin: no obvious inflamed joints   Psychiatric: She has a normal mood and affect.      ASSESSMENT/ PLAN:  1. Gout: will change allopurinol to 300 m in the AM and 100 mg in the PM; will begin colchicine 0.6 mg daily will check uric acid level in 3 weeks.        MD is aware of resident's narcotic use and is in agreement with current plan of care. We will attempt to wean resident as apropriate   Ok Edwards NP [facility] Adult [facility]  Contact [phone] Monday through Friday 8am- 5pm  After hours call [phone]

## 2016-10-30 ENCOUNTER — Encounter: Payer: Self-pay | Admitting: Internal Medicine

## 2016-10-30 ENCOUNTER — Non-Acute Institutional Stay (SKILLED_NURSING_FACILITY): Payer: Medicare Other | Admitting: Internal Medicine

## 2016-10-30 DIAGNOSIS — G8929 Other chronic pain: Secondary | ICD-10-CM

## 2016-10-30 DIAGNOSIS — N183 Chronic kidney disease, stage 3 unspecified: Secondary | ICD-10-CM

## 2016-10-30 DIAGNOSIS — E785 Hyperlipidemia, unspecified: Secondary | ICD-10-CM | POA: Diagnosis not present

## 2016-10-30 DIAGNOSIS — E034 Atrophy of thyroid (acquired): Secondary | ICD-10-CM

## 2016-10-30 DIAGNOSIS — D649 Anemia, unspecified: Secondary | ICD-10-CM | POA: Diagnosis not present

## 2016-10-30 DIAGNOSIS — E1169 Type 2 diabetes mellitus with other specified complication: Secondary | ICD-10-CM

## 2016-10-30 DIAGNOSIS — M1A9XX0 Chronic gout, unspecified, without tophus (tophi): Secondary | ICD-10-CM

## 2016-10-30 DIAGNOSIS — E1143 Type 2 diabetes mellitus with diabetic autonomic (poly)neuropathy: Secondary | ICD-10-CM

## 2016-10-30 DIAGNOSIS — R600 Localized edema: Secondary | ICD-10-CM

## 2016-10-30 DIAGNOSIS — I10 Essential (primary) hypertension: Secondary | ICD-10-CM

## 2016-10-30 DIAGNOSIS — F324 Major depressive disorder, single episode, in partial remission: Secondary | ICD-10-CM | POA: Diagnosis not present

## 2016-10-30 NOTE — Progress Notes (Signed)
Patient ID: Lauren Barber, female   DOB: [birth year], 58 y.o.   MRN: [id number]    DATE:  10/30/2016  Location:    [facility] Room Number: 119 B Place of Service: SNF (31)   Extended Emergency Contact Information Primary Emergency Contact: Hill,[name] Address: [address] [place], [place] [address] [place] Phone: [phone] Relation: Mother  Advanced Directive information Does Patient Have a Medical Advance Directive?: No, Would patient like information on creating a medical advance directive?: No - Patient declined  Chief Complaint  Patient presents with  . Medical Management of Chronic Issues    Routine Visit    HPI:  60 yo female long term resident seen today for f/u. She c/o shooting severe left foot pain up to thigh not  relieved with usual pain meds. she was given 1 dose of norco 5/325 last night + routine meds that helped. Gout meds recently changed last week. No other concerns. Appetite ok. She has difficulty sleeping. No recent falls.  Gout - uric acid level 12.7. Currently on allopurinol 400 mg daily and colchicine daily  Chronic pain - takes ultram 200 mg twice daily; elavil 100 mg nightly; zanaflex 4 mg every 8 hours as needed; robaxin 500 mg every 8 hours as needed; neurontin 400 mg three times daily   GERD - stable on protonix 40 mg daily  Constipation - stable on colace twice daily; miralax daily   Depression - stable on prozac 40 mg daily and takes melatonin 3 mg nightly for sleep.   DM - stable on tradjenta 5 mg daily. A1c 6.5%. She has neuropathy and takes neurontin 400mg  TID  HTN - stable on lisinopril 2.5 mg daily;   Hypothyroidism - stable on synthroid 50 mcg daily. TSH 4.20.   Lower extremity edema - stable on demadex 20 mg daily and aldactone 25 mg daily   Dyslipidemia - stable on fenofibrate 48 mg daily. LDL 24; TG 164   CKD stage III - Cr 2.66 and stable   Anemia - likely due to CKD; Hgb  10  Past Medical History:  Diagnosis Date  . Anemia   . Anxiety   . Atopic conjunctivitis   . Cardiomegaly   . Cellulitis    legs  . CVA (cerebral vascular accident) ([place])   . Degenerative, intervertebral disc    back, legs  . Depressive disorder    depressive disorder  . Diabetes mellitus without complication ([place])   . Endometriosis   . Essential hypertension 04/12/2007   Qualifier: Diagnosis of  By: Jobe Igo MD, Shanon Brow    . Gout    feet  . Headache(784.0)    otc meds prn  . History of blood transfusion [place] - unsure number of units transfused  . Hypertension   . Insomnia   . Neuromuscular disorder ([place])    diabetic neuropathy - feet  . Obesities, morbid ([place])   . Stroke [facility]) 2013  . Vaginal bleeding     Past Surgical History:  Procedure Laterality Date  . BIOPSY ENDOMETRIAL N/A 10 03 2013  . [place]   x 2  . DILATION AND CURETTAGE OF UTERUS  2013  . HYSTEROSCOPY W/D&C N/A 06/28/2014   Procedure: DILATATION AND CURETTAGE /HYSTEROSCOPY;  Surgeon: Lavonia Drafts, MD;  Location: [facility];  Service: Gynecology;  Laterality: N/A;  . TONSILLECTOMY    . WISDOM TOOTH EXTRACTION  Patient Care Team: Gildardo Cranker, DO as PCP - General (Internal Medicine) Gerlene Fee, NP as Nurse Practitioner (Nurse Practitioner) Tarri Abernethy ([place])  Social History   Social History  . Marital status: Single    Spouse name: N/A  . Number of children: N/A  . Years of education: N/A   Occupational History  . Not on file.   Social History Main Topics  . Smoking status: Former Smoker    Packs/day: 0.50    Years: 43.00    Types: Cigarettes    Quit date: 06/11/2012  . Smokeless tobacco: Never Used  . Alcohol use No  . Drug use: No  . Sexual activity: No   Other Topics Concern  . Not on file   Social History Narrative  . No narrative on file     reports that she quit smoking about 4 years ago. Her  smoking use included Cigarettes. She has a 21.50 pack-year smoking history. She has never used smokeless tobacco. She reports that she does not drink alcohol or use drugs.  Family History  Problem Relation Age of Onset  . Hypertension Mother   . Hypertension Father    Family Status  Relation Status  . Mother   . Father     Immunization History  Administered Date(s) Administered  . Influenza Whole 09/09/2013  . Influenza-Unspecified 09/01/2014, 08/12/2015, 09/13/2016  . Pneumococcal Polysaccharide-23 12/16/2014  . Td 07/22/2006    Allergies  Allergen Reactions  . Shellfish Allergy     Medications: Patient's Medications  New Prescriptions   No medications on file  Previous Medications   ACETAMINOPHEN (TYLENOL) 325 MG TABLET    Take 650 mg by mouth every 6 (six) hours as needed for moderate pain, fever or headache.    ALLOPURINOL (ZYLOPRIM) 100 MG TABLET    Take by mouth daily. Take 100 mg by mouth at bedtime and take 300 mg by mouth during the day   AMITRIPTYLINE (ELAVIL) 100 MG TABLET    Take 100 mg by mouth at bedtime.   COLCHICINE 0.6 MG TABLET    Take 0.6 mg by mouth daily.   DIPHENHYDRAMINE (BENADRYL) 25 MG TABLET    Take 25 mg by mouth every 6 (six) hours as needed.   DOCUSATE SODIUM (COLACE) 100 MG CAPSULE    Take 1 capsule (100 mg total) by mouth 2 (two) times daily.   EPINEPHRINE (EPIPEN) 0.3 MG/0.3 ML DEVI    Inject 0.3 mg into the muscle once.   FENOFIBRATE (TRICOR) 48 MG TABLET    Take 48 mg by mouth at bedtime.   FLUOXETINE (PROZAC) 40 MG CAPSULE    Take 40 mg by mouth daily.   GABAPENTIN (NEURONTIN) 300 MG CAPSULE    Take 300 mg by mouth 2 (two) times daily.    HYDROCORTISONE CREAM 1 %    Apply 1 application topically 2 (two) times daily.   LEVOTHYROXINE (SYNTHROID, LEVOTHROID) 50 MCG TABLET    Take 1 tablet (50 mcg total) by mouth daily.   LINAGLIPTIN (TRADJENTA) 5 MG TABS TABLET    Take 5 mg by mouth daily.   LISINOPRIL (PRINIVIL,ZESTRIL) 2.5 MG TABLET     Take 2.5 mg by mouth daily.   MELATONIN 3 MG TABS    Take 3 mg by mouth at bedtime.   METHOCARBAMOL (ROBAXIN) 500 MG TABLET    Take 500 mg by mouth every 8 (eight) hours as needed for muscle spasms. Reported on 04/17/2016   PANTOPRAZOLE (  PROTONIX) 40 MG TABLET    Take 1 tablet (40 mg total) by mouth daily.   POLYETHYLENE GLYCOL (MIRALAX / GLYCOLAX) PACKET    Take 17 g by mouth daily.   POLYVINYL ALCOHOL (LIQUIFILM TEARS) 1.4 % OPHTHALMIC SOLUTION    Place 1 drop into both eyes every 4 (four) hours as needed for dry eyes.   SODIUM CHLORIDE (OCEAN) 0.65 % SOLN NASAL SPRAY    Place 1 spray into both nostrils as needed for congestion.   SPIRONOLACTONE (ALDACTONE) 25 MG TABLET    Take 25 mg by mouth daily.   TIZANIDINE (ZANAFLEX) 4 MG TABLET    Take 4 mg by mouth every 8 (eight) hours as needed for muscle spasms.   TORSEMIDE (DEMADEX) 20 MG TABLET    Take 40 mg by mouth daily.    TRAMADOL (ULTRAM-ER) 100 MG 24 HR TABLET    Take 200 mg by mouth 2 (two) times daily.   VITAMIN D, ERGOCALCIFEROL, (DRISDOL) 50000 UNITS CAPS CAPSULE    Take 50,000 Units by mouth every 7 (seven) days. Every Wednesday  Modified Medications   No medications on file  Discontinued Medications   No medications on file    Review of Systems  Cardiovascular: Positive for leg swelling.  Musculoskeletal: Positive for arthralgias, back pain and gait problem.  Neurological: Positive for numbness.  Psychiatric/Behavioral: Positive for dysphoric mood.  All other systems reviewed and are negative.   Vitals:   10/30/16 1125  BP: (!) 119/49  Pulse: 71  Resp: 20  Temp: 97.6 F (36.4 C)  TempSrc: Oral  SpO2: 97%  Weight: (!) 351 lb (159.2 kg)  Height: 5\' 9"  (1.753 m)   Body mass index is 51.83 kg/m.  Physical Exam  Constitutional: She is oriented to person, place, and time. She appears well-developed.  Frail appearing, Sitting up in bed in NAD  HENT:  Mouth/Throat: Oropharynx is clear and moist. No oropharyngeal  exudate.  Eyes: Pupils are equal, round, and reactive to light. No scleral icterus.  Neck: Neck supple. Carotid bruit is not present. No tracheal deviation present. No thyromegaly present.  Cardiovascular: Normal rate, regular rhythm and intact distal pulses.  Exam reveals no gallop and no friction rub.   Murmur (1/6 SEM) heard. R>LLE +1 pitting edema. No calf TTP.   Pulmonary/Chest: Effort normal and breath sounds normal. No stridor. No respiratory distress. She has no wheezes. She has no rales.  Abdominal: Soft. Bowel sounds are normal. She exhibits no distension and no mass. There is no hepatomegaly. There is no tenderness. There is no rebound and no guarding.  obese  Musculoskeletal: She exhibits edema (left ankle with increased warmth to touch) and tenderness.  Lymphadenopathy:    She has no cervical adenopathy.  Neurological: She is alert and oriented to person, place, and time. She has normal reflexes.  RLE hemiparesis  Skin: Skin is warm and dry. No rash noted.  Psychiatric: She has a normal mood and affect. Her behavior is normal. Thought content normal.     Labs reviewed: Nursing Home on 10/25/2016  Component Date Value Ref Range Status  . Uric Acid 10/25/2016 12.7   Final  . Vitamin D, 25-Hydroxy 10/25/2016 25.94   Final  Nursing Home on 09/27/2016  Component Date Value Ref Range Status  . Hemoglobin 09/12/2016 10.0* 12.0 - 16.0 g/dL Final  . HCT 09/12/2016 32* 36 - 46 % Final  . Platelets 09/12/2016 274  150 - 399 K/L Final  . WBC 09/12/2016 9.2  10^3/mL Final  . Glucose 09/12/2016 142  mg/dL Final  . BUN 09/12/2016 41* 4 - 21 mg/dL Final  . Creatinine 09/12/2016 2.7* 0.5 - 1.1 mg/dL Final  . Potassium 09/12/2016 4.7  3.4 - 5.3 mmol/L Final  . Sodium 09/12/2016 138  137 - 147 mmol/L Final  . Triglycerides 09/12/2016 157  40 - 160 mg/dL Final  . Cholesterol 09/12/2016 145  0 - 200 mg/dL Final  . HDL 09/12/2016 40  35 - 70 mg/dL Final  . LDL Cholesterol 09/12/2016 74   mg/dL Final  . Alkaline Phosphatase 09/12/2016 87  25 - 125 U/L Final  . ALT 09/12/2016 21  7 - 35 U/L Final  . AST 09/12/2016 26  13 - 35 U/L Final  . Bilirubin, Total 09/12/2016 0.3  mg/dL Final  . TSH 09/12/2016 4.20  0.41 - 5.90 uIU/mL Final    No results found.   Assessment/Plan   ICD-9-CM ICD-10-CM   1. Chronic gout without tophus, unspecified cause, unspecified site 274.02 [id number]   2. Diabetic autonomic neuropathy associated with type 2 diabetes mellitus ([place]) 250.60 E11.43    337.1    3. Other chronic pain 338.29 G89.29   4. Hypothyroidism due to acquired atrophy of thyroid 244.8 E03.4    246.8    5. Chronic kidney disease (CKD), stage III (moderate) 585.3 N18.3   6. Bilateral lower extremity edema 782.3 R60.0   7. Essential hypertension 401.9 I10   8. Anemia, unspecified type 285.9 D64.9   9. Major depressive disorder with single episode, in partial remission (HCC) 296.25 F32.4   10. Dyslipidemia associated with type 2 diabetes mellitus (HCC) 250.80 E11.69    272.4 E78.5     norco 5/325 po q8hrs prn severe gout pain  PT eval for safety for powered w/c use  Check CMP, CBC w diff and A1c  Cont other meds as ordered  Will follow  Stellarose Cerny S. Perlie Gold  [facility] [address] [place], [place] [address] [phone] Cell (Monday-Friday 8 AM - 5 PM) [phone] After 5 PM and follow prompts

## 2016-11-05 ENCOUNTER — Other Ambulatory Visit: Payer: Self-pay | Admitting: *Deleted

## 2016-11-05 MED ORDER — HYDROCODONE-ACETAMINOPHEN 5-325 MG PO TABS: ORAL_TABLET | ORAL | 0 refills | Status: DC

## 2016-11-05 NOTE — Telephone Encounter (Signed)
[facility] #: [phone] Fax#: [phone]  

## 2016-11-13 ENCOUNTER — Non-Acute Institutional Stay (SKILLED_NURSING_FACILITY): Payer: Medicare Other | Admitting: Adult Health

## 2016-11-13 ENCOUNTER — Encounter: Payer: Self-pay | Admitting: Adult Health

## 2016-11-13 DIAGNOSIS — N183 Chronic kidney disease, stage 3 unspecified: Secondary | ICD-10-CM

## 2016-11-13 DIAGNOSIS — M109 Gout, unspecified: Secondary | ICD-10-CM | POA: Diagnosis not present

## 2016-11-13 DIAGNOSIS — E1143 Type 2 diabetes mellitus with diabetic autonomic (poly)neuropathy: Secondary | ICD-10-CM | POA: Diagnosis not present

## 2016-11-13 DIAGNOSIS — I10 Essential (primary) hypertension: Secondary | ICD-10-CM | POA: Diagnosis not present

## 2016-11-13 NOTE — Progress Notes (Signed)
Location:   Financial risk analyst of Service:  SNF (31)   CODE STATUS: full code   Allergies  Allergen Reactions  . Shellfish Allergy     Chief Complaint  Patient presents with  . Acute Visit    renal function     HPI:  Her renal function has worsened. I have discussed the lab results with her. We will need to reduce her renal offending medications. She does understand the information given. I did tell her that I would get a renal ultrasound    Past Medical History:  Diagnosis Date  . Anemia   . Anxiety   . Atopic conjunctivitis   . Cardiomegaly   . Cellulitis    legs  . CVA (cerebral vascular accident) ([place])   . Degenerative, intervertebral disc    back, legs  . Depressive disorder    depressive disorder  . Diabetes mellitus without complication ([place])   . Endometriosis   . Essential hypertension 04/12/2007   Qualifier: Diagnosis of  By: Jobe Igo MD, Shanon Brow    . Gout    feet  . Headache(784.0)    otc meds prn  . History of blood transfusion [place] - unsure number of units transfused  . Hypertension   . Insomnia   . Neuromuscular disorder ([place])    diabetic neuropathy - feet  . Obesities, morbid ([place])   . Stroke [facility]) 2013  . Vaginal bleeding     Past Surgical History:  Procedure Laterality Date  . BIOPSY ENDOMETRIAL N/A 10 03 2013  . [place]   x 2  . DILATION AND CURETTAGE OF UTERUS  2013  . HYSTEROSCOPY W/D&C N/A 06/28/2014   Procedure: DILATATION AND CURETTAGE /HYSTEROSCOPY;  Surgeon: Lavonia Drafts, MD;  Location: [facility];  Service: Gynecology;  Laterality: N/A;  . TONSILLECTOMY    . WISDOM TOOTH EXTRACTION      Social History   Social History  . Marital status: Single    Spouse name: N/A  . Number of children: N/A  . Years of education: N/A   Occupational History  . Not on file.   Social History Main Topics  . Smoking status: Former Smoker    Packs/day: 0.50    Years: 43.00    Types: Cigarettes   Quit date: 06/11/2012  . Smokeless tobacco: Never Used  . Alcohol use No  . Drug use: No  . Sexual activity: No   Other Topics Concern  . Not on file   Social History Narrative  . No narrative on file   Family History  Problem Relation Age of Onset  . Hypertension Mother   . Hypertension Father       VITAL SIGNS BP (!) 119/49   Pulse 71   Temp 97.6 F (36.4 C)   Resp 20   Ht 5\' 9"  (1.753 m)   Wt (!) 351 lb (159.2 kg)   SpO2 97%   BMI 51.83 kg/m   Patient's Medications  New Prescriptions   No medications on file  Previous Medications   ACETAMINOPHEN (TYLENOL) 325 MG TABLET    Take 650 mg by mouth every 6 (six) hours as needed for moderate pain, fever or headache.    ALLOPURINOL (ZYLOPRIM) 100 MG TABLET    Take 400 mg by mouth daily.   AMITRIPTYLINE (ELAVIL) 100 MG TABLET    Take 100 mg by mouth at bedtime.   COLCHICINE 0.6 MG TABLET  Take 0.6 mg by mouth daily.   DIPHENHYDRAMINE (BENADRYL) 25 MG TABLET    Take 25 mg by mouth every 6 (six) hours as needed.   DOCUSATE SODIUM (COLACE) 100 MG CAPSULE    Take 1 capsule (100 mg total) by mouth 2 (two) times daily.   EPINEPHRINE (EPIPEN) 0.3 MG/0.3 ML DEVI    Inject 0.3 mg into the muscle once.   FENOFIBRATE (TRICOR) 48 MG TABLET    Take 48 mg by mouth at bedtime.   FLUOXETINE (PROZAC) 40 MG CAPSULE    Take 40 mg by mouth daily.   GABAPENTIN (NEURONTIN) 300 MG CAPSULE    Take 300 mg by mouth 2 (two) times daily.    HYDROCODONE-ACETAMINOPHEN (NORCO/VICODIN) 5-325 MG TABLET    Take one tablet by mouth every 8 hours as needed for pain   HYDROCORTISONE CREAM 1 %    Apply 1 application topically 2 (two) times daily.   LEVOTHYROXINE (SYNTHROID, LEVOTHROID) 50 MCG TABLET    Take 1 tablet (50 mcg total) by mouth daily.   LINAGLIPTIN (TRADJENTA) 5 MG TABS TABLET    Take 5 mg by mouth daily.   LISINOPRIL (PRINIVIL,ZESTRIL) 2.5 MG TABLET    Take 2.5 mg by mouth daily.   MELATONIN 3 MG TABS    Take 3 mg by mouth at bedtime.    METHOCARBAMOL (ROBAXIN) 500 MG TABLET    Take 500 mg by mouth every 8 (eight) hours as needed for muscle spasms. Reported on 04/17/2016   PANTOPRAZOLE (PROTONIX) 40 MG TABLET    Take 1 tablet (40 mg total) by mouth daily.   POLYETHYLENE GLYCOL (MIRALAX / GLYCOLAX) PACKET    Take 17 g by mouth daily.   POLYVINYL ALCOHOL (LIQUIFILM TEARS) 1.4 % OPHTHALMIC SOLUTION    Place 1 drop into both eyes every 4 (four) hours as needed for dry eyes.   SODIUM CHLORIDE (OCEAN) 0.65 % SOLN NASAL SPRAY    Place 1 spray into both nostrils as needed for congestion.   SPIRONOLACTONE (ALDACTONE) 25 MG TABLET    Take 25 mg by mouth daily.   TIZANIDINE (ZANAFLEX) 4 MG TABLET    Take 4 mg by mouth every 8 (eight) hours as needed for muscle spasms.   TORSEMIDE (DEMADEX) 20 MG TABLET    Take 40 mg by mouth daily.    TRAMADOL (ULTRAM-ER) 100 MG 24 HR TABLET    Take 200 mg by mouth 2 (two) times daily.   VITAMIN D, ERGOCALCIFEROL, (DRISDOL) 50000 UNITS CAPS CAPSULE    Take 50,000 Units by mouth every 7 (seven) days. Every Wednesday  Modified Medications   No medications on file  Discontinued Medications   ALLOPURINOL (ZYLOPRIM) 100 MG TABLET    Take 400 mg by mouth daily. Take 100 mg by mouth at bedtime and take 300 mg by mouth during the day     SIGNIFICANT DIAGNOSTIC EXAMS   12-14-14 ct of head: No evidence of acute intracranial abnormality. Small vessel ischemic changes with intracranial atherosclerosis.  12-15-14: ct of abdomen and pelvis: No evidence of abdominal, pelvic, or retroperitoneal hematoma. Prominent stool-filled rectosigmoid colon with gaseous distention of the remainder of the colon suggesting obstipation with pseudo-obstruction. Mass versus prominent vascularity in the subcarinal region.   12-15-14: renal ultrasound: Study is moderately limited by patient body habitus but appears grossly normal.   12-15-14: ct of chest: 1. While the mediastinal structures are difficult to evaluate without IV contrast,  there does not appear to be a subcarinal  mass and the findings on comparison CT likely represent the left atrium. 2. Left basilar atelectasis.  12-15-14: EEG: This EEG is abnormal with mild localized nonspecific continuous slowing of cerebral activity. No evidence of an epileptic disorder was demonstrated. Lack of epileptiform activity during EEG recording does not rule out seizure disorder, however  04-29-15: right lower extremity doppler: neg for dvt  06-01-15: No active cardiopulmonary disease.Right-sided PICC line with the tip projecting over the SVC     LABS REVIEWED:   12-12-15: chol 90; ldl 34; trig 121; hdl 32 12-26-15: tsh 3.07; vit D 18 12-28-15: uric acid 4.2; tsh 4.75; free  T4: 1.1; hgb a1c 6.3 12-30-15: uric acid 4.3; free T4: 1.0; chol 80; ldl 24; trig 164; hdl 23; hgb a1c 6.5  03-19-16: urine micro-albumin <0.2 04-18-16: glucose 122; bun 32; creat 1.89; k+ 4.1; na++136  04-20-16: wbc 7.4; hgb 9.5; hct 29.3; mcv 96.1 ;plt 297; glucose 135; bun 37; creat 1.93; k+ 4.6; na++ 138; liver normal albumin 3.6; hgb a1c 6.5; vit D 25  07-13-16: glucose 1345; bun 30; creat 1.84; k+ 4.2 ;na++ 139  10-24-16: uric acid 12.7; vit D 25.94 10-31-16: wbc 8.0; hgb 8.6; hct 27.8; mcv 100.5; plt 249; glucose 106; bun 69.2; creat 3.54; k+ 4.5; na++ 135; liver normal albumin 4.1 11-07-16: glucose 118; bun 66.8; creat 3.09; k+ 4.1; na++ 138    Review of Systems Constitutional: Negative for appetite change and fatigue.  HENT: Negative for congestion.   Respiratory: Negative for cough, chest tightness and shortness of breath.   Cardiovascular: Negative for chest pain and palpitations. No edema  Gastrointestinal: Negative for nausea, abdominal pain, diarrhea and constipation.  Musculoskeletal: no joint or muscle pain  Skin:  Right great toe is ingrown and inflamed  Neurological: Negative for dizziness.  Psychiatric/Behavioral: The patient is not nervous/anxious.     Physical Exam Constitutional: She is  oriented to person, place, and time. No distress.  Morbidly obese   Eyes: Conjunctivae are normal.  Neck: Neck supple. No JVD present. No thyromegaly present.  Cardiovascular: Normal rate, regular rhythm and intact distal pulses.   Respiratory: Effort normal and breath sounds normal. No respiratory distress. She has no wheezes.  GI: Soft. Bowel sounds are normal. She exhibits no distension. There is no tenderness.  Musculoskeletal: She exhibits 1+ bilateral lower extremity edema   Able to move all extremities  Has limited range of motion due to obesity  .  Lymphadenopathy:    She has no cervical adenopathy.  Neurological: She is alert and oriented to person, place, and time.  Skin: no obvious inflamed joints   Psychiatric: She has a normal mood and affect.      ASSESSMENT/ PLAN:  1. Gout: no recent flares  Will lower her allopurinol to 200 mg daily due to her renal function  2. Diabetes: will continue tradjenta 5 mg daily her hgb a1c is 6.5   3. Hypertension:  Will stop the lisinopril due to her renal function   4. CKD: stage III: bun/creat 66.8/3.09;    Will setup a renal ultrasound Will repeat bmp; hgb a1c and lipids in one week    MD is aware of resident's narcotic use and is in agreement with current plan of care. We will attempt to wean resident as apropriate   Ok Edwards NP [facility]  Contact [phone] Monday through Friday 8am- 5pm  After hours call [phone]

## 2016-11-22 LAB — HEMOGLOBIN A1C: Hemoglobin A1C: 6.9

## 2016-11-22 LAB — LIPID PANEL
Cholesterol: 170 mg/dL (ref 0–200)
HDL: 45 mg/dL (ref 35–70)
LDL CALC: 85 mg/dL
Triglycerides: 200 mg/dL — AB (ref 40–160)

## 2016-11-30 ENCOUNTER — Non-Acute Institutional Stay (SKILLED_NURSING_FACILITY): Payer: Medicare Other | Admitting: Adult Health

## 2016-11-30 DIAGNOSIS — N183 Chronic kidney disease, stage 3 unspecified: Secondary | ICD-10-CM

## 2016-11-30 DIAGNOSIS — E034 Atrophy of thyroid (acquired): Secondary | ICD-10-CM | POA: Diagnosis not present

## 2016-11-30 DIAGNOSIS — R600 Localized edema: Secondary | ICD-10-CM

## 2016-11-30 DIAGNOSIS — I10 Essential (primary) hypertension: Secondary | ICD-10-CM | POA: Diagnosis not present

## 2016-11-30 DIAGNOSIS — E1143 Type 2 diabetes mellitus with diabetic autonomic (poly)neuropathy: Secondary | ICD-10-CM | POA: Diagnosis not present

## 2016-11-30 DIAGNOSIS — M109 Gout, unspecified: Secondary | ICD-10-CM

## 2016-11-30 DIAGNOSIS — E785 Hyperlipidemia, unspecified: Secondary | ICD-10-CM | POA: Diagnosis not present

## 2016-11-30 DIAGNOSIS — E1169 Type 2 diabetes mellitus with other specified complication: Secondary | ICD-10-CM

## 2016-12-22 NOTE — Progress Notes (Signed)
Location:   Cabin crew of Service:   snf   CODE STATUS: full code  Allergies  Allergen Reactions  . Shellfish Allergy     Chief Complaint  Patient presents with  . Medical Management of Chronic Issues    HPI:  She is a long term resident of this facility being seen for the management of her chronic illnesses. Her renal ultrasound is pending. She tells me that she is feeling good and has no complaints. There are no nursing concerns at this time.     Past Medical History:  Diagnosis Date  . Anemia   . Anxiety   . Atopic conjunctivitis   . Cardiomegaly   . Cellulitis    legs  . CVA (cerebral vascular accident) ([place])   . Degenerative, intervertebral disc    back, legs  . Depressive disorder    depressive disorder  . Diabetes mellitus without complication ([place])   . Endometriosis   . Essential hypertension 04/12/2007   Qualifier: Diagnosis of  By: Jobe Igo MD, Shanon Brow    . Gout    feet  . Headache(784.0)    otc meds prn  . History of blood transfusion [place] - unsure number of units transfused  . Hypertension   . Insomnia   . Neuromuscular disorder ([place])    diabetic neuropathy - feet  . Obesities, morbid ([place])   . Stroke [facility]) 2013  . Vaginal bleeding     Past Surgical History:  Procedure Laterality Date  . BIOPSY ENDOMETRIAL N/A 10 03 2013  . [place]   x 2  . DILATION AND CURETTAGE OF UTERUS  2013  . HYSTEROSCOPY W/D&C N/A 06/28/2014   Procedure: DILATATION AND CURETTAGE /HYSTEROSCOPY;  Surgeon: Lavonia Drafts, MD;  Location: [facility];  Service: Gynecology;  Laterality: N/A;  . TONSILLECTOMY    . WISDOM TOOTH EXTRACTION      Social History   Social History  . Marital status: Single    Spouse name: N/A  . Number of children: N/A  . Years of education: N/A   Occupational History  . Not on file.   Social History Main Topics  . Smoking status: Former Smoker    Packs/day: 0.50    Years: 43.00    Types:  Cigarettes    Quit date: 06/11/2012  . Smokeless tobacco: Never Used  . Alcohol use No  . Drug use: No  . Sexual activity: No   Other Topics Concern  . Not on file   Social History Narrative  . No narrative on file   Family History  Problem Relation Age of Onset  . Hypertension Mother   . Hypertension Father       VITAL SIGNS BP (!) 119/50   Pulse 71   Temp 97.6 F (36.4 C)   Resp 20   Ht 5\' 9"  (1.753 m)   Wt (!) 351 lb (159.2 kg)   SpO2 97%   BMI 51.83 kg/m   Patient's Medications  New Prescriptions   No medications on file  Previous Medications   ACETAMINOPHEN (TYLENOL) 325 MG TABLET    Take 650 mg by mouth every 6 (six) hours as needed for moderate pain, fever or headache.    ALLOPURINOL (ZYLOPRIM) 100 MG TABLET    Take 200 mg by mouth daily.   AMITRIPTYLINE (ELAVIL) 100 MG TABLET    Take 100 mg by mouth at bedtime.   COLCHICINE 0.6 MG TABLET  Take 0.6 mg by mouth daily.   DIPHENHYDRAMINE (BENADRYL) 25 MG TABLET    Take 25 mg by mouth every 6 (six) hours as needed.   DOCUSATE SODIUM (COLACE) 100 MG CAPSULE    Take 1 capsule (100 mg total) by mouth 2 (two) times daily.   EPINEPHRINE (EPIPEN) 0.3 MG/0.3 ML DEVI    Inject 0.3 mg into the muscle once.   FENOFIBRATE (TRICOR) 48 MG TABLET    Take 48 mg by mouth at bedtime.   FLUOXETINE (PROZAC) 40 MG CAPSULE    Take 40 mg by mouth daily.   GABAPENTIN (NEURONTIN) 300 MG CAPSULE    Take 300 mg by mouth 2 (two) times daily.    HYDROCODONE-ACETAMINOPHEN (NORCO/VICODIN) 5-325 MG TABLET    Take one tablet by mouth every 8 hours as needed for pain   HYDROCORTISONE CREAM 1 %    Apply 1 application topically 2 (two) times daily.   LEVOTHYROXINE (SYNTHROID, LEVOTHROID) 50 MCG TABLET    Take 1 tablet (50 mcg total) by mouth daily.   LINAGLIPTIN (TRADJENTA) 5 MG TABS TABLET    Take 5 mg by mouth daily.   MELATONIN 3 MG TABS    Take 3 mg by mouth at bedtime.   METHOCARBAMOL (ROBAXIN) 500 MG TABLET    Take 500 mg by mouth every 8  (eight) hours as needed for muscle spasms. Reported on 04/17/2016   PANTOPRAZOLE (PROTONIX) 40 MG TABLET    Take 1 tablet (40 mg total) by mouth daily.   POLYETHYLENE GLYCOL (MIRALAX / GLYCOLAX) PACKET    Take 17 g by mouth daily.   POLYVINYL ALCOHOL (LIQUIFILM TEARS) 1.4 % OPHTHALMIC SOLUTION    Place 1 drop into both eyes every 4 (four) hours as needed for dry eyes.   SODIUM CHLORIDE (OCEAN) 0.65 % SOLN NASAL SPRAY    Place 1 spray into both nostrils as needed for congestion.   SPIRONOLACTONE (ALDACTONE) 25 MG TABLET    Take 25 mg by mouth daily.   TIZANIDINE (ZANAFLEX) 4 MG TABLET    Take 4 mg by mouth every 8 (eight) hours as needed for muscle spasms.   TORSEMIDE (DEMADEX) 20 MG TABLET    Take 40 mg by mouth daily.    TRAMADOL (ULTRAM-ER) 100 MG 24 HR TABLET    Take 200 mg by mouth 2 (two) times daily.   VITAMIN D, ERGOCALCIFEROL, (DRISDOL) 50000 UNITS CAPS CAPSULE    Take 50,000 Units by mouth every 7 (seven) days. Every Wednesday  Modified Medications   No medications on file  Discontinued Medications   No medications on file     SIGNIFICANT DIAGNOSTIC EXAMS   12-14-14 ct of head: No evidence of acute intracranial abnormality. Small vessel ischemic changes with intracranial atherosclerosis.  12-15-14: ct of abdomen and pelvis: No evidence of abdominal, pelvic, or retroperitoneal hematoma. Prominent stool-filled rectosigmoid colon with gaseous distention of the remainder of the colon suggesting obstipation with pseudo-obstruction. Mass versus prominent vascularity in the subcarinal region.   12-15-14: renal ultrasound: Study is moderately limited by patient body habitus but appears grossly normal.   12-15-14: ct of chest: 1. While the mediastinal structures are difficult to evaluate without IV contrast, there does not appear to be a subcarinal mass and the findings on comparison CT likely represent the left atrium. 2. Left basilar atelectasis.  12-15-14: EEG: This EEG is abnormal with  mild localized nonspecific continuous slowing of cerebral activity. No evidence of an epileptic disorder was demonstrated. Lack of  epileptiform activity during EEG recording does not rule out seizure disorder, however  04-29-15: right lower extremity doppler: neg for dvt  06-01-15: No active cardiopulmonary disease.Right-sided PICC line with the tip projecting over the SVC     LABS REVIEWED:   12-12-15: chol 90; ldl 34; trig 121; hdl 32 12-26-15: tsh 3.07; vit D 18 12-28-15: uric acid 4.2; tsh 4.75; free  T4: 1.1; hgb a1c 6.3 12-30-15: uric acid 4.3; free T4: 1.0; chol 80; ldl 24; trig 164; hdl 23; hgb a1c 6.5  03-19-16: urine micro-albumin <0.2 04-18-16: glucose 122; bun 32; creat 1.89; k+ 4.1; na++136  04-20-16: wbc 7.4; hgb 9.5; hct 29.3; mcv 96.1 ;plt 297; glucose 135; bun 37; creat 1.93; k+ 4.6; na++ 138; liver normal albumin 3.6; hgb a1c 6.5; vit D 25  07-13-16: glucose 1345; bun 30; creat 1.84; k+ 4.2 ;na++ 139  09-12-16: wbc 9.2; hgb 10.0; hct 32.2; mcv 103.0; plt 274; glucose 142; bun 40.9; creat 2.66; k+ 4.7; na++ 138; liver normal albumin 4.1; tsh 4.20  10-24-16: uric acid 12.7; vit D 25.94 10-31-16: wbc 8.0; hgb 8.6; hct 27.8; mcv 100.5; plt 249; glucose 106; bun 69.2; creat 3.54; k+ 4.5; na++ 135; liver normal albumin 4.1 11-07-16: glucose 118; bun 66.8; creat 3.09; k+ 4.1; na++ 138  11-22-16: glucose 113; bun 64.7; creat 2.81; k+ 4.3; na++ 138; ca 10.4; chol 174; ldl 85; trig 200; hdl 45     Review of Systems Constitutional: Negative for appetite change and fatigue.  HENT: Negative for congestion.   Respiratory: Negative for cough, chest tightness and shortness of breath.   Cardiovascular: Negative for chest pain and palpitations. No edema  Gastrointestinal: Negative for nausea, abdominal pain, diarrhea and constipation.  Musculoskeletal: no joint or muscle pain  Skin: Negative for pallor.  Neurological: Negative for dizziness.  Psychiatric/Behavioral: The patient is not  nervous/anxious.     Physical Exam Constitutional: She is oriented to person, place, and time. No distress.  Morbidly obese   Eyes: Conjunctivae are normal.  Neck: Neck supple. No JVD present. No thyromegaly present.  Cardiovascular: Normal rate, regular rhythm and intact distal pulses.   Respiratory: Effort normal and breath sounds normal. No respiratory distress. She has no wheezes.  GI: Soft. Bowel sounds are normal. She exhibits no distension. There is no tenderness.  Musculoskeletal: She exhibits 2+ bilateral lower extremity edema   Able to move all extremities  Has limited range of motion due to obesity  .  Lymphadenopathy:    She has no cervical adenopathy.  Neurological: She is alert and oriented to person, place, and time.  Skin: Skin is warm and dry. She is not diaphoretic.   Psychiatric: She has a normal mood and affect.      ASSESSMENT/ PLAN:  1. Gout: will continue  allopurinol 200 mg daily; no recent flares   2. Chronic pain: her pain is being managed with her current regimen will continue ultram 200 mg twice daily; elavil 100 mg nightly; zanaflex 4 mg every 8 hours as needed robaxin 500 mg every 8 hours as needed;   Will continue  neurontin 400 mg three times daily   3. Gerd: will continue protonix 40 mg daily  4. Constipation: colace twice daily miralax daily   5. Depression: she is emotionally stable; will continue prozac 40 mg daily and takes melatonin 3 mg nightly for sleep.   6. Diabetes: will continue tradjenta 5 mg daily her hgb a1c is 6.9   7. Hypertension: her lisinopril  was stopped due her renal function; will monitor   8. Hypothyroidism: will continue  synthroid 50 mcg daily   tsh is 4.20.   9. Lower extremity edema: will continue  demadex 20 mg daily and aldactone 25 mg daily   10. Dyslipidemia: will continue fenofibrate 48 mg daily ldl 85; trig 200   11. Peripheral neuropathy: will continue  her neurontin  400 mg three times daily   12. CKD:  stage III: bun/creat 64.7/2.81; will monitor       MD is aware of resident's narcotic use and is in agreement with current plan of care. We will attempt to wean resident as apropriate   Ok Edwards NP [facility]  Contact [phone] Monday through Friday 8am- 5pm  After hours call [phone]

## 2017-01-08 ENCOUNTER — Encounter: Payer: Self-pay | Admitting: Adult Health

## 2017-01-08 ENCOUNTER — Non-Acute Institutional Stay (SKILLED_NURSING_FACILITY): Payer: Medicare Other | Admitting: Adult Health

## 2017-01-08 DIAGNOSIS — G8929 Other chronic pain: Secondary | ICD-10-CM

## 2017-01-08 DIAGNOSIS — E034 Atrophy of thyroid (acquired): Secondary | ICD-10-CM

## 2017-01-08 DIAGNOSIS — I10 Essential (primary) hypertension: Secondary | ICD-10-CM | POA: Diagnosis not present

## 2017-01-08 DIAGNOSIS — E1143 Type 2 diabetes mellitus with diabetic autonomic (poly)neuropathy: Secondary | ICD-10-CM

## 2017-01-08 DIAGNOSIS — N183 Chronic kidney disease, stage 3 unspecified: Secondary | ICD-10-CM

## 2017-01-08 DIAGNOSIS — E785 Hyperlipidemia, unspecified: Secondary | ICD-10-CM | POA: Diagnosis not present

## 2017-01-08 DIAGNOSIS — E1169 Type 2 diabetes mellitus with other specified complication: Secondary | ICD-10-CM | POA: Diagnosis not present

## 2017-01-08 DIAGNOSIS — R600 Localized edema: Secondary | ICD-10-CM | POA: Diagnosis not present

## 2017-01-08 LAB — BASIC METABOLIC PANEL
BUN: 47 mg/dL — AB (ref 4–21)
CREATININE: 2.1 mg/dL — AB (ref 0.5–1.1)
GLUCOSE: 129 mg/dL
POTASSIUM: 4.2 mmol/L (ref 3.4–5.3)
SODIUM: 139 mmol/L (ref 137–147)

## 2017-01-08 NOTE — Progress Notes (Signed)
Location:   [place] Room Number: [id number] Place of Service:  SNF (31)   CODE STATUS: Full Code  Allergies  Allergen Reactions  . Shellfish Allergy     Chief Complaint  Patient presents with  . Medical Management of Chronic Issues    Routine follow up    HPI:  She is a long term resident of this facility being seen for the management of her chronic illnesses. Overall her status is same. She is complaining of right shoulder pain and worsening of her neuropathy pain. She does spend all of her time in bed per her choice. There are no nursing concerns at this time.    Past Medical History:  Diagnosis Date  . Anemia   . Anxiety   . Atopic conjunctivitis   . Cardiomegaly   . Cellulitis    legs  . CVA (cerebral vascular accident) ([place])   . Degenerative, intervertebral disc    back, legs  . Depressive disorder    depressive disorder  . Diabetes mellitus without complication ([place])   . Endometriosis   . Essential hypertension 04/12/2007   Qualifier: Diagnosis of  By: Jobe Igo MD, Shanon Brow    . Gout    feet  . Headache(784.0)    otc meds prn  . History of blood transfusion [place] - unsure number of units transfused  . Hypertension   . Insomnia   . Neuromuscular disorder ([place])    diabetic neuropathy - feet  . Obesities, morbid ([place])   . Stroke [facility]) 2013  . Vaginal bleeding     Past Surgical History:  Procedure Laterality Date  . BIOPSY ENDOMETRIAL N/A 10 03 2013  . [place]   x 2  . DILATION AND CURETTAGE OF UTERUS  2013  . HYSTEROSCOPY W/D&C N/A 06/28/2014   Procedure: DILATATION AND CURETTAGE /HYSTEROSCOPY;  Surgeon: Lavonia Drafts, MD;  Location: [facility];  Service: Gynecology;  Laterality: N/A;  . TONSILLECTOMY    . WISDOM TOOTH EXTRACTION      Social History   Social History  . Marital status: Single    Spouse name: N/A  . Number of children: N/A  . Years of education: N/A   Occupational History  . Not  on file.   Social History Main Topics  . Smoking status: Former Smoker    Packs/day: 0.50    Years: 43.00    Types: Cigarettes    Quit date: 06/11/2012  . Smokeless tobacco: Never Used  . Alcohol use No  . Drug use: No  . Sexual activity: No   Other Topics Concern  . Not on file   Social History Narrative  . No narrative on file   Family History  Problem Relation Age of Onset  . Hypertension Mother   . Hypertension Father       VITAL SIGNS BP 121/67   Pulse 82   Temp 98.8 F (37.1 C)   Resp 20   Ht 5\' 9"  (1.753 m)   Wt (!) 330 lb (149.7 kg)   SpO2 98% Comment: room air  BMI 48.73 kg/m   Patient's Medications  New Prescriptions   No medications on file  Previous Medications   ACETAMINOPHEN (TYLENOL) 325 MG TABLET    Take 650 mg by mouth every 6 (six) hours as needed for moderate pain, fever or headache.    ALLOPURINOL (ZYLOPRIM) 100 MG TABLET    Take 200 mg by mouth daily.  AMITRIPTYLINE (ELAVIL) 100 MG TABLET    Take 100 mg by mouth at bedtime.   COLCHICINE 0.6 MG TABLET    Take 0.6 mg by mouth daily.   DIPHENHYDRAMINE (BENADRYL) 25 MG TABLET    Take 25 mg by mouth every 6 (six) hours as needed.   DOCUSATE SODIUM (COLACE) 100 MG CAPSULE    Take 1 capsule (100 mg total) by mouth 2 (two) times daily.   EPINEPHRINE (EPIPEN) 0.3 MG/0.3 ML DEVI    Inject 0.3 mg into the muscle once.   FENOFIBRATE (TRICOR) 48 MG TABLET    Take 48 mg by mouth at bedtime.   FLUOXETINE (PROZAC) 40 MG CAPSULE    Take 40 mg by mouth daily.   GABAPENTIN (NEURONTIN) 300 MG CAPSULE    Take 300 mg by mouth 2 (two) times daily.    HYDROCODONE-ACETAMINOPHEN (NORCO/VICODIN) 5-325 MG TABLET    Take one tablet by mouth every 8 hours as needed for pain   HYDROCORTISONE CREAM 1 %    Apply 1 application topically 2 (two) times daily.   LEVOTHYROXINE (SYNTHROID, LEVOTHROID) 50 MCG TABLET    Take 1 tablet (50 mcg total) by mouth daily.   LINAGLIPTIN (TRADJENTA) 5 MG TABS TABLET    Take 5 mg by mouth  daily.   MELATONIN 3 MG TABS    Take 3 mg by mouth at bedtime.   METHOCARBAMOL (ROBAXIN) 500 MG TABLET    Take 500 mg by mouth every 8 (eight) hours as needed for muscle spasms. Reported on 04/17/2016   PANTOPRAZOLE (PROTONIX) 40 MG TABLET    Take 1 tablet (40 mg total) by mouth daily.   POLYETHYLENE GLYCOL (MIRALAX / GLYCOLAX) PACKET    Take 17 g by mouth daily.   POLYVINYL ALCOHOL (LIQUIFILM TEARS) 1.4 % OPHTHALMIC SOLUTION    Place 1 drop into both eyes every 4 (four) hours as needed for dry eyes.   SODIUM CHLORIDE (OCEAN) 0.65 % SOLN NASAL SPRAY    Place 1 spray into both nostrils as needed for congestion.   SPIRONOLACTONE (ALDACTONE) 25 MG TABLET    Take 25 mg by mouth daily.   TIZANIDINE (ZANAFLEX) 4 MG TABLET    Take 4 mg by mouth every 8 (eight) hours as needed for muscle spasms.   TORSEMIDE (DEMADEX) 20 MG TABLET    Take 40 mg by mouth daily.    TRAMADOL (ULTRAM-ER) 100 MG 24 HR TABLET    Take 200 mg by mouth 2 (two) times daily.   VITAMIN D, ERGOCALCIFEROL, (DRISDOL) 50000 UNITS CAPS CAPSULE    Take 50,000 Units by mouth every 7 (seven) days. Every Wednesday  Modified Medications   No medications on file  Discontinued Medications   ALLOPURINOL (ZYLOPRIM) 100 MG TABLET    Take 400 mg by mouth daily.   LISINOPRIL (PRINIVIL,ZESTRIL) 2.5 MG TABLET    Take 2.5 mg by mouth daily.     SIGNIFICANT DIAGNOSTIC EXAMS  12-14-14 ct of head: No evidence of acute intracranial abnormality. Small vessel ischemic changes with intracranial atherosclerosis.  12-15-14: ct of abdomen and pelvis: No evidence of abdominal, pelvic, or retroperitoneal hematoma. Prominent stool-filled rectosigmoid colon with gaseous distention of the remainder of the colon suggesting obstipation with pseudo-obstruction. Mass versus prominent vascularity in the subcarinal region.   12-15-14: renal ultrasound: Study is moderately limited by patient body habitus but appears grossly normal.   12-15-14: ct of chest: 1. While the  mediastinal structures are difficult to evaluate without IV contrast, there  does not appear to be a subcarinal mass and the findings on comparison CT likely represent the left atrium. 2. Left basilar atelectasis.  12-15-14: EEG: This EEG is abnormal with mild localized nonspecific continuous slowing of cerebral activity. No evidence of an epileptic disorder was demonstrated. Lack of epileptiform activity during EEG recording does not rule out seizure disorder, however  04-29-15: right lower extremity doppler: neg for dvt  06-01-15: No active cardiopulmonary disease.Right-sided PICC line with the tip projecting over the SVC     LABS REVIEWED:   12-26-15: tsh 3.07; vit D 18 12-28-15: uric acid 4.2; tsh 4.75; free  T4: 1.1; hgb a1c 6.3 12-30-15: uric acid 4.3; free T4: 1.0; chol 80; ldl 24; trig 164; hdl 23; hgb a1c 6.5  03-19-16: urine micro-albumin <0.2 04-18-16: glucose 122; bun 32; creat 1.89; k+ 4.1; na++136  04-20-16: wbc 7.4; hgb 9.5; hct 29.3; mcv 96.1 ;plt 297; glucose 135; bun 37; creat 1.93; k+ 4.6; na++ 138; liver normal albumin 3.6; hgb a1c 6.5; vit D 25  07-13-16: glucose 1345; bun 30; creat 1.84; k+ 4.2 ;na++ 139  09-12-16: wbc 9.2; hgb 10.0; hct 32.2; mcv 103.0; plt 274; glucose 142; bun 40.9; creat 2.66; k+ 4.7; na++ 138; liver normal albumin 4.1; tsh 4.20  10-24-16: uric acid 12.7; vit D 25.94 10-31-16: wbc 8.0; hgb 8.6; hct 27.8; mcv 100.5; plt 249; glucose 106; bun 69.2; creat 3.54; k+ 4.5; na++ 135; liver normal albumin 4.1 11-07-16: glucose 118; bun 66.8; creat 3.09; k+ 4.1; na++ 138  11-22-16: glucose 113; bun 64.7; creat 2.81; k+ 4.3; na++ 138; ca 10.4; chol 174; ldl 85; trig 200; hdl 45 hgb a1c 6.9    Review of Systems Constitutional: Negative for appetite change and fatigue.  HENT: Negative for congestion.   Respiratory: Negative for cough, chest tightness and shortness of breath.   Cardiovascular: Negative for chest pain and palpitations. No edema  Gastrointestinal: Negative  for nausea, abdominal pain, diarrhea and constipation.  Musculoskeletal: has right shoulder pain and neuropathy pain in bilateral lower extremities Skin: Negative for pallor.  Neurological: Negative for dizziness.  Psychiatric/Behavioral: The patient is not nervous/anxious.     Physical Exam Constitutional: She is oriented to person, place, and time. No distress.  Morbidly obese   Eyes: Conjunctivae are normal.  Neck: Neck supple. No JVD present. No thyromegaly present.  Cardiovascular: Normal rate, regular rhythm and intact distal pulses.   Respiratory: Effort normal and breath sounds normal. No respiratory distress. She has no wheezes.  GI: Soft. Bowel sounds are normal. She exhibits no distension. There is no tenderness.  Musculoskeletal: She exhibits 2+ bilateral lower extremity edema   Able to move all extremities  Has limited range of motion due to obesity  .  Lymphadenopathy:    She has no cervical adenopathy.  Neurological: She is alert and oriented to person, place, and time.  Skin: Skin is warm and dry. She is not diaphoretic.   Psychiatric: She has a normal mood and affect.      ASSESSMENT/ PLAN:  1. Gout: will continue  allopurinol 200 mg daily; no recent flares   2. Chronic pain:  will continue ultram 200 mg twice daily; elavil 100 mg nightly; zanaflex 4 mg every 8 hours as needed robaxin 500 mg every 8 hours as needed;   Will increase neurontin to 500 mg three times  daily will begin oxycodone 5 mg every 6 hours as needed  3. Gerd: will continue protonix 40 mg daily  4.  Constipation: colace twice daily miralax daily   5. Depression: she is emotionally stable; will continue prozac 40 mg daily and takes melatonin 3 mg nightly for sleep.   6. Diabetes: will continue tradjenta 5 mg daily her hgb a1c is 6.9   7. Hypertension: her lisinopril was stopped due her renal function; will monitor   8. Hypothyroidism: will continue  synthroid 50 mcg daily   tsh is 4.20.    9. Lower extremity edema: will continue  demadex 20 mg daily and aldactone 25 mg daily   10. Dyslipidemia: will continue fenofibrate 48 mg daily ldl 85; trig 200   11. Peripheral neuropathy: will continue  her neurontin  400 mg three times daily   12. CKD: stage III: bun/creat 64.7/2.81; will monitor     MD is aware of resident's narcotic use and is in agreement with current plan of care. We will attempt to wean resident as apropriate     Ok Edwards NP [facility]  Contact [phone] Monday through Friday 8am- 5pm  After hours call [phone]

## 2017-02-18 LAB — BASIC METABOLIC PANEL
BUN: 52 mg/dL — AB (ref 4–21)
Creatinine: 2 mg/dL — AB (ref 0.5–1.1)
Glucose: 143 mg/dL
Potassium: 4.4 mmol/L (ref 3.4–5.3)
SODIUM: 139 mmol/L (ref 137–147)

## 2017-02-18 LAB — VITAMIN B12: Vitamin B-12: 660

## 2017-02-18 LAB — HEPATIC FUNCTION PANEL
ALT: 23 U/L (ref 7–35)
AST: 20 U/L (ref 13–35)
Alkaline Phosphatase: 60 U/L (ref 25–125)
Bilirubin, Total: 0.2 mg/dL

## 2017-02-18 LAB — CBC AND DIFFERENTIAL
HCT: 32 % — AB (ref 36–46)
HEMOGLOBIN: 10.1 g/dL — AB (ref 12.0–16.0)
Platelets: 280 10*3/uL (ref 150–399)
WBC: 7.3 10^3/mL

## 2017-02-18 LAB — HEMOGLOBIN A1C: Hemoglobin A1C: 6.3

## 2017-04-16 ENCOUNTER — Non-Acute Institutional Stay (SKILLED_NURSING_FACILITY): Payer: Medicare Other | Admitting: Internal Medicine

## 2017-04-16 ENCOUNTER — Encounter: Payer: Self-pay | Admitting: Internal Medicine

## 2017-04-16 DIAGNOSIS — F324 Major depressive disorder, single episode, in partial remission: Secondary | ICD-10-CM | POA: Diagnosis not present

## 2017-04-16 DIAGNOSIS — R600 Localized edema: Secondary | ICD-10-CM

## 2017-04-16 DIAGNOSIS — E034 Atrophy of thyroid (acquired): Secondary | ICD-10-CM | POA: Diagnosis not present

## 2017-04-16 DIAGNOSIS — N183 Chronic kidney disease, stage 3 unspecified: Secondary | ICD-10-CM

## 2017-04-16 DIAGNOSIS — E1143 Type 2 diabetes mellitus with diabetic autonomic (poly)neuropathy: Secondary | ICD-10-CM | POA: Diagnosis not present

## 2017-04-16 DIAGNOSIS — G8929 Other chronic pain: Secondary | ICD-10-CM | POA: Diagnosis not present

## 2017-04-16 DIAGNOSIS — I10 Essential (primary) hypertension: Secondary | ICD-10-CM | POA: Diagnosis not present

## 2017-04-16 NOTE — Progress Notes (Signed)
Patient ID: Lauren Barber, female   DOB: [birth year], 61 y.o.   MRN: [id number]    DATE: 04/16/2017  Location:    [facility] Room Number: 026 B Place of Service: SNF (31)   Extended Emergency Contact Information Primary Emergency Contact: Hill,[name] Address: [address] [place], [place] [address] [place] Phone: [phone] Relation: Mother  Advanced Directive information Does Patient Have a Medical Advance Directive?: No, Would patient like information on creating a medical advance directive?: No - Patient declined  Chief Complaint  Patient presents with  . Medical Management of Chronic Issues    Routine Visit [facility]    HPI:  61 yo female long term resident seen today for f/u. She states her swelling is better in her LE. She is c/a not getting a w/c. She was told she could not have an electric one while at SNF. She is a poor historian due to psych d/o. Hx obtained from chart. She is morbidly obese and not very mobile.  Gout - stable on allopurinol 200 mg daily; no recent flares   Chronic pain syndrome - stable on ultram 200 mg twice daily; elavil 100 mg nightly; zanaflex 4 mg every 8 hours as needed; robaxin 500 mg every 8 hours as needed; neurontin 300 mg three times daily; oxycodone 5 mg every 6 hours as needed  GERD - stable on protonix 40 mg daily  Chronic Constipation - stable on colace twice daily; miralax daily   Depression - mood stable on prozac 40 mg daily. She takes melatonin 3 mg nightly for sleep.   DM - controlled. A1c 6.3%. Takes Tradjenta 5 mg daily. She has neuropathy and takes gabapentin   Hypertension - diet controlled. BP stable off lisinopril   Hypothyroidism - stable on synthroid 50 mcg daily. TSH 4.20.   Lower extremity edema - stable on demadex 20 mg daily and aldactone 25 mg daily   Dyslipidemia - stable on fenofibrate 48 mg daily. 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 pain  controlled on neurontin 300 mg three times daily   CKD - stage 3. Cr 2.02. Improved off lisinopril   Past Medical History:  Diagnosis Date  . Anemia   . Anxiety   . Atopic conjunctivitis   . Cardiomegaly   . Cellulitis    legs  . CVA (cerebral vascular accident) ([place])   . Degenerative, intervertebral disc    back, legs  . Depressive disorder    depressive disorder  . Diabetes mellitus without complication ([place])   . Endometriosis   . Essential hypertension 04/12/2007   Qualifier: Diagnosis of  By: Jobe Igo MD, Shanon Brow    . Gout    feet  . Headache(784.0)    otc meds prn  . History of blood transfusion [place] - unsure number of units transfused  . Hypertension   . Insomnia   . Neuromuscular disorder ([place])    diabetic neuropathy - feet  . Obesities, morbid ([place])   . Stroke [facility]) 2013  . Vaginal bleeding     Past Surgical History:  Procedure Laterality Date  . BIOPSY ENDOMETRIAL N/A 10 03 2013  . [place]   x 2  . DILATION AND CURETTAGE OF UTERUS  2013  . HYSTEROSCOPY W/D&C N/A 06/28/2014   Procedure: DILATATION AND CURETTAGE /HYSTEROSCOPY;  Surgeon: Lavonia Drafts, MD;  Location: [facility];  Service: Gynecology;  Laterality: N/A;  . TONSILLECTOMY    . [place] EXTRACTION      Patient Care Team: Gildardo Cranker, DO as PCP - General (Internal Medicine) Nyoka Cowden Phylis Bougie, NP as Nurse Practitioner (Nurse Practitioner) [facility], [place] ([place])  Social History   Social History  . Marital status: Single    Spouse name: N/A  . Number of children: N/A  . Years of education: N/A   Occupational History  . Not on file.   Social History Main Topics  . Smoking status: Former Smoker    Packs/day: 0.50    Years: 43.00    Types: Cigarettes    Quit date: 06/11/2012  . Smokeless tobacco: Never Used  . Alcohol use No  . Drug use: No  . Sexual activity: No   Other Topics Concern  . Not on file   Social  History Narrative  . No narrative on file     reports that she quit smoking about 4 years ago. Her smoking use included Cigarettes. She has a 21.50 pack-year smoking history. She has never used smokeless tobacco. She reports that she does not drink alcohol or use drugs.  Family History  Problem Relation Age of Onset  . Hypertension Mother   . Hypertension Father    Family Status  Relation Status  . Mother (Not Specified)  . Father (Not Specified)    Immunization History  Administered Date(s) Administered  . Influenza Whole 09/09/2013  . Influenza-Unspecified 09/01/2014, 08/12/2015, 09/13/2016  . Pneumococcal Polysaccharide-23 12/16/2014  . Td 07/22/2006    Allergies  Allergen Reactions  . Shellfish Allergy     Medications: Patient's Medications  New Prescriptions   No medications on file  Previous Medications   ACETAMINOPHEN (TYLENOL) 325 MG TABLET    Take 650 mg by mouth every 6 (six) hours as needed for moderate pain, fever or headache.    ALLOPURINOL (ZYLOPRIM) 100 MG TABLET    Take 200 mg by mouth daily.   AMITRIPTYLINE (ELAVIL) 100 MG TABLET    Take 100 mg by mouth at bedtime.   COLCHICINE 0.6 MG TABLET    Take 0.6 mg by mouth daily.   DIPHENHYDRAMINE (BENADRYL) 25 MG TABLET    Take 25 mg by mouth every 6 (six) hours as needed.   DOCUSATE SODIUM (COLACE) 100 MG CAPSULE    Take 1 capsule (100 mg total) by mouth 2 (two) times daily.   EPINEPHRINE (EPIPEN) 0.3 MG/0.3 ML DEVI    Inject 0.3 mg into the muscle once.   FENOFIBRATE (TRICOR) 48 MG TABLET    Take 48 mg by mouth at bedtime.   FLUOXETINE (PROZAC) 40 MG CAPSULE    Take 40 mg by mouth daily.   GABAPENTIN (NEURONTIN) 300 MG CAPSULE    Take 300 mg by mouth 3 (three) times daily.    HYDROCORTISONE CREAM 1 %    Apply 1 application topically 2 (two) times daily.   LEVOTHYROXINE (SYNTHROID, LEVOTHROID) 50 MCG TABLET    Take 1 tablet (50 mcg total) by mouth daily.   LINAGLIPTIN (TRADJENTA) 5 MG TABS TABLET    Take 5 mg  by mouth daily.   MELATONIN 3 MG TABS    Take 3 mg by mouth at bedtime.   METHOCARBAMOL (ROBAXIN) 500 MG TABLET    Take 500 mg by mouth every 8 (eight) hours as needed for muscle spasms. Reported on 04/17/2016   OXYCODONE HCL, ABUSE DETER, (OXAYDO) 5 MG TABA  Take 1 tablet by mouth every 6 (six) hours as needed.   PANTOPRAZOLE (PROTONIX) 40 MG TABLET    Take 1 tablet (40 mg total) by mouth daily.   POLYETHYLENE GLYCOL (MIRALAX / GLYCOLAX) PACKET    Take 17 g by mouth daily.   POLYVINYL ALCOHOL (LIQUIFILM TEARS) 1.4 % OPHTHALMIC SOLUTION    Place 1 drop into both eyes every 4 (four) hours as needed for dry eyes.   SODIUM CHLORIDE (OCEAN) 0.65 % SOLN NASAL SPRAY    Place 1 spray into both nostrils as needed for congestion.   SPIRONOLACTONE (ALDACTONE) 25 MG TABLET    Take 25 mg by mouth daily.   TIZANIDINE (ZANAFLEX) 4 MG TABLET    Take 4 mg by mouth every 8 (eight) hours as needed for muscle spasms.   TORSEMIDE (DEMADEX) 20 MG TABLET    Take 40 mg by mouth daily.    TRAMADOL HCL PO    Take 100 mg by mouth every 6 (six) hours.   VITAMIN D, ERGOCALCIFEROL, (DRISDOL) 50000 UNITS CAPS CAPSULE    Take 50,000 Units by mouth every 7 (seven) days. Every Wednesday  Modified Medications   No medications on file  Discontinued Medications   HYDROCODONE-ACETAMINOPHEN (NORCO/VICODIN) 5-325 MG TABLET    Take one tablet by mouth every 8 hours as needed for pain   TRAMADOL (ULTRAM-ER) 100 MG 24 HR TABLET    Take 200 mg by mouth 2 (two) times daily.    Review of Systems  Cardiovascular: Positive for leg swelling.  Musculoskeletal: Positive for arthralgias, back pain and gait problem.  Neurological: Positive for numbness.  Psychiatric/Behavioral: Positive for dysphoric mood.  All other systems reviewed and are negative.   Vitals:   04/16/17 1306  BP: 108/70  Pulse: 94  Resp: 18  Temp: 98.5 F (36.9 C)  TempSrc: Oral  SpO2: 92%  Weight: (!) 325 lb 9.6 oz (147.7 kg)  Height: 5\' 9"  (1.753 m)   Body  mass index is 48.08 kg/m.  Physical Exam  Constitutional: She appears well-developed.  Sitting up in bed in NAD  HENT:  Mouth/Throat: Oropharynx is clear and moist. No oropharyngeal exudate.  Eyes: Pupils are equal, round, and reactive to light. No scleral icterus.  Neck: Neck supple. Carotid bruit is not present. No tracheal deviation present. No thyromegaly present.  Cardiovascular: Normal rate, regular rhythm and intact distal pulses.  Exam reveals no gallop and no friction rub.   Murmur (1/6 SEM) heard. Trace LE edema b/l. No calf TTP.   Pulmonary/Chest: Effort normal and breath sounds normal. No stridor. No respiratory distress. She has no wheezes. She has no rales.  Abdominal: Soft. Bowel sounds are normal. She exhibits no distension and no mass. There is no hepatomegaly. There is no tenderness. There is no rebound and no guarding.  obese  Musculoskeletal: She exhibits edema and tenderness.  Lymphadenopathy:    She has no cervical adenopathy.  Neurological: She is alert.  RLE hemiparesis  Skin: Skin is warm and dry. No rash noted.  Psychiatric: She has a normal mood and affect. Her behavior is normal. Thought content normal.     Labs reviewed: Nursing Home on 04/16/2017  Component Date Value Ref Range Status  . Hemoglobin 02/18/2017 10.1* 12.0 - 16.0 g/dL Final  . HCT 02/18/2017 32* 36 - 46 % Final  . Platelets 02/18/2017 280  150 - 399 K/L Final  . WBC 02/18/2017 7.3  10^3/mL Final  . Glucose 02/18/2017 143  mg/dL Final  .  BUN 02/18/2017 52* 4 - 21 mg/dL Final  . Creatinine 02/18/2017 2.0* 0.5 - 1.1 mg/dL Final  . Potassium 02/18/2017 4.4  3.4 - 5.3 mmol/L Final  . Sodium 02/18/2017 139  137 - 147 mmol/L Final  . Alkaline Phosphatase 02/18/2017 60  25 - 125 U/L Final  . ALT 02/18/2017 23  7 - 35 U/L Final  . AST 02/18/2017 20  13 - 35 U/L Final  . Bilirubin, Total 02/18/2017 0.2  mg/dL Final  . Vitamin B-12 02/18/2017 660   Final  . Hemoglobin A1C 02/18/2017 6.3    Final  Abstract on 02/04/2017  Component Date Value Ref Range Status  . Glucose 01/08/2017 129  mg/dL Final  . BUN 01/08/2017 47* 4 - 21 mg/dL Final  . Creatinine 01/08/2017 2.1* 0.5 - 1.1 mg/dL Final  . Potassium 01/08/2017 4.2  3.4 - 5.3 mmol/L Final  . Sodium 01/08/2017 139  137 - 147 mmol/L Final  . Triglycerides 11/22/2016 200* 40 - 160 mg/dL Final  . Cholesterol 11/22/2016 170  0 - 200 mg/dL Final  . HDL 11/22/2016 45  35 - 70 mg/dL Final  . LDL Cholesterol 11/22/2016 85  mg/dL Final  . Hemoglobin A1C 11/22/2016 6.9   Final    No results found.   Assessment/Plan   ICD-10-CM   1. Morbid obesity due to excess calories (HCC) E66.01   2. Other chronic pain G89.29   3. Diabetic autonomic neuropathy associated with type 2 diabetes mellitus (HCC) E11.43   4. Essential hypertension I10   5. Hypothyroidism due to acquired atrophy of thyroid E03.4   6. Chronic kidney disease (CKD), stage III (moderate) N18.3   7. 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 Bilateral lower extremity edema R60.0     S/w therapy regarding status of her w/c. Awaiting manual w/c as electric one not allowed while at SNF  Discussed caloric intake and importance of exercise to improve weight loss   Cont current meds as ordered  Psych services to follow  PT/OT as indicated  [facility] NP to follow  Will follow  Freddy Spadafora S. Perlie Gold  [facility] [address] [place], [place] [address] [phone] Cell (Monday-Friday 8 AM - 5 PM) [phone] After 5 PM and follow prompts

## 2017-04-29 ENCOUNTER — Encounter (HOSPITAL_COMMUNITY): Payer: Self-pay | Admitting: Emergency Medicine

## 2017-04-29 ENCOUNTER — Emergency Department (HOSPITAL_COMMUNITY)
Admission: EM | Admit: 2017-04-29 | Discharge: 2017-04-29 | Disposition: A | Discharge status: Home / Self Care | Payer: Medicare Other | Attending: Emergency Medicine | Admitting: Emergency Medicine

## 2017-04-29 DIAGNOSIS — Z79899 Other long term (current) drug therapy: Secondary | ICD-10-CM | POA: Diagnosis not present

## 2017-04-29 DIAGNOSIS — E1140 Type 2 diabetes mellitus with diabetic neuropathy, unspecified: Secondary | ICD-10-CM | POA: Diagnosis not present

## 2017-04-29 DIAGNOSIS — E039 Hypothyroidism, unspecified: Secondary | ICD-10-CM | POA: Insufficient documentation

## 2017-04-29 DIAGNOSIS — Z87891 Personal history of nicotine dependence: Secondary | ICD-10-CM | POA: Insufficient documentation

## 2017-04-29 DIAGNOSIS — K5900 Constipation, unspecified: Secondary | ICD-10-CM | POA: Diagnosis not present

## 2017-04-29 DIAGNOSIS — I129 Hypertensive chronic kidney disease with stage 1 through stage 4 chronic kidney disease, or unspecified chronic kidney disease: Secondary | ICD-10-CM | POA: Insufficient documentation

## 2017-04-29 DIAGNOSIS — Z8673 Personal history of transient ischemic attack (TIA), and cerebral infarction without residual deficits: Secondary | ICD-10-CM | POA: Insufficient documentation

## 2017-04-29 DIAGNOSIS — N183 Chronic kidney disease, stage 3 (moderate): Secondary | ICD-10-CM | POA: Diagnosis not present

## 2017-04-29 NOTE — ED Triage Notes (Signed)
Patient here from [facility] via [facility] due to an abnormal colon xray that was taken at her facility. Patient has history of colon distention in the past. Patient reports taking laxatives for the past four days and being on a clear liquid diet. Patient alert and oriented x 4. No complaints of pain at this time.

## 2017-04-29 NOTE — ED Notes (Signed)
Patient repositioned and pillows under feet repositioned to provide comfort for patient.

## 2017-04-29 NOTE — Discharge Instructions (Signed)
Continue advancing her diet as tolerated. Continue use of laxatives as needed. Follow-up with your primary care in 3-4 days for reevaluation. Return to the ED if any concerning signs or symptoms develop.

## 2017-04-29 NOTE — ED Provider Notes (Signed)
[facility] DEPT Provider Note   CSN: [id number] Arrival date & time: 04/29/17  1326     History   Chief Complaint Chief Complaint  Patient presents with  . GI Problem    HPI Lauren Barber is a 61 y.o. female with PMHx HTN, DM, morbid obesity, anxiety, and diabetic neuropathy who presents today from her nursing home with chief complaint acute onset, progressively improving constipation. She states that 6 days ago "I had been so stopped up that it came all the way up to my stomach". She states that she had a distended abdomen and was started on a liquid diet with an increase in her dosage of her daily stool softener and laxative. She states that she has had similar constipation in the past. She states that she had a watery loose stool 3 day ago with a more formed stool last night. She denies melena, hematochezia, dysuria, hematuria or any other urinary symptoms. She states that earlier today she was put on a "vegetable and dessert "diet, which she has been tolerating well. She states that the physician at her facility obtained an x-ray and "was not happy with what he saw ", resulting in her presentation to the ED. She denies any abdominal pain, nausea, vomiting, fevers, chills, chest pain, shortness of breath.  The history is provided by the patient.    Past Medical History:  Diagnosis Date  . Anemia   . Anxiety   . Atopic conjunctivitis   . Cardiomegaly   . Cellulitis    legs  . CVA (cerebral vascular accident) ([place])   . Degenerative, intervertebral disc    back, legs  . Depressive disorder    depressive disorder  . Diabetes mellitus without complication ([place])   . Endometriosis   . Essential hypertension 04/12/2007   Qualifier: Diagnosis of  By: Jobe Igo MD, Shanon Brow    . Gout    feet  . Headache(784.0)    otc meds prn  . History of blood transfusion [place] - unsure number of units transfused  . Hypertension   . Insomnia   . Neuromuscular disorder ([place])     diabetic neuropathy - feet  . Obesities, morbid ([place])   . Stroke [facility]) 2013  . Vaginal bleeding     Patient Active Problem List   Diagnosis Date Noted  . Ingrown nail of great toe of right foot 10/24/2016  . Cellulitis of right thigh 08/13/2016  . Bilateral lower extremity edema 06/11/2016  . Type II diabetes mellitus with peripheral autonomic neuropathy ([place]) 05/05/2016  . Dyslipidemia associated with type 2 diabetes mellitus ([place]) 05/05/2016  . Hypothyroidism 06/06/2015  . Chronic kidney disease (CKD), stage III (moderate) 12/24/2014  . Insomnia 09/09/2014  . Slow transit constipation 09/09/2014  . Peripheral autonomic neuropathy due to DM ([place]) 12/02/2013  . Gout 07/03/2013  . Anemia 02/18/2013  . Chronic pain 02/18/2013  . Depression 02/18/2013  . OBESITY, MORBID 04/12/2007  . Essential hypertension 04/12/2007  . DEGENERATION, DISC NOS 04/12/2007    Past Surgical History:  Procedure Laterality Date  . BIOPSY ENDOMETRIAL N/A 10 03 2013  . [place]   x 2  . DILATION AND CURETTAGE OF UTERUS  2013  . HYSTEROSCOPY W/D&C N/A 06/28/2014   Procedure: DILATATION AND CURETTAGE /HYSTEROSCOPY;  Surgeon: Lavonia Drafts, MD;  Location: [facility];  Service: Gynecology;  Laterality: N/A;  . TONSILLECTOMY    . WISDOM TOOTH EXTRACTION      OB History  Gravida Para Term Preterm AB Living   2 2 2  0 0 2   SAB TAB Ectopic Multiple Live Births   0 0 0 0         Home Medications    Prior to Admission medications   Medication Sig Start Date End Date Taking? Authorizing Provider  acetaminophen (TYLENOL) 325 MG tablet Take 650 mg by mouth every 6 (six) hours as needed for moderate pain, fever or headache.     [provider]  allopurinol (ZYLOPRIM) 100 MG tablet Take 200 mg by mouth daily.    [provider]  amitriptyline (ELAVIL) 100 MG tablet Take 100 mg by mouth at bedtime.    [provider]  colchicine 0.6 MG tablet Take 0.6  mg by mouth daily.    [provider]  diphenhydrAMINE (BENADRYL) 25 MG tablet Take 25 mg by mouth every 6 (six) hours as needed.    [provider]  docusate sodium (COLACE) 100 MG capsule Take 1 capsule (100 mg total) by mouth 2 (two) times daily. 12/19/14   Rai, Ripudeep K, MD  EPINEPHrine (EPIPEN) 0.3 mg/0.3 mL DEVI Inject 0.3 mg into the muscle once.    [provider]  fenofibrate (TRICOR) 48 MG tablet Take 48 mg by mouth at bedtime.    [provider]  FLUoxetine (PROZAC) 40 MG capsule Take 40 mg by mouth daily.    [provider]  gabapentin (NEURONTIN) 300 MG capsule Take 300 mg by mouth 3 (three) times daily.     [provider]  hydrocortisone cream 1 % Apply 1 application topically 2 (two) times daily.    [provider]  levothyroxine (SYNTHROID, LEVOTHROID) 50 MCG tablet Take 1 tablet (50 mcg total) by mouth daily. 11/18/15   Gerlene Fee, NP  linagliptin (TRADJENTA) 5 MG TABS tablet Take 5 mg by mouth daily.    [provider]  Melatonin 3 MG TABS Take 3 mg by mouth at bedtime.    [provider]  methocarbamol (ROBAXIN) 500 MG tablet Take 500 mg by mouth every 8 (eight) hours as needed for muscle spasms. Reported on 04/17/2016    [provider]  OxyCODONE HCl, Abuse Deter, (OXAYDO) 5 MG TABA Take 1 tablet by mouth every 6 (six) hours as needed.    [provider]  pantoprazole (PROTONIX) 40 MG tablet Take 1 tablet (40 mg total) by mouth daily. 12/19/14   Rai, Vernelle Emerald, MD  polyethylene glycol (MIRALAX / GLYCOLAX) packet Take 17 g by mouth daily.    [provider]  polyvinyl alcohol (LIQUIFILM TEARS) 1.4 % ophthalmic solution Place 1 drop into both eyes every 4 (four) hours as needed for dry eyes.    [provider]  sodium chloride (OCEAN) 0.65 % SOLN nasal spray Place 1 spray into both nostrils as needed for congestion.    [provider]  spironolactone  (ALDACTONE) 25 MG tablet Take 25 mg by mouth daily.    [provider]  tiZANidine (ZANAFLEX) 4 MG tablet Take 4 mg by mouth every 8 (eight) hours as needed for muscle spasms.    [provider]  torsemide (DEMADEX) 20 MG tablet Take 40 mg by mouth daily.     [provider]  TRAMADOL HCL PO Take 100 mg by mouth every 6 (six) hours.    [provider]  Vitamin D, Ergocalciferol, (DRISDOL) 50000 units CAPS capsule Take 50,000 Units by mouth every 7 (seven) days.  Every Wednesday    [provider]    Family History Family History  Problem Relation Age of Onset  . Hypertension Mother   . Hypertension Father     Social History Social History  Substance Use Topics  . Smoking status: Former Smoker    Packs/day: 0.50    Years: 43.00    Types: Cigarettes    Quit date: 06/11/2012  . Smokeless tobacco: Never Used  . Alcohol use No     Allergies   Shellfish allergy   Review of Systems Review of Systems  Constitutional: Negative for chills and fever.  Respiratory: Negative for shortness of breath.   Cardiovascular: Negative for chest pain.  Gastrointestinal: Positive for abdominal distention, constipation and diarrhea. Negative for abdominal pain, blood in stool, nausea, rectal pain and vomiting.  Genitourinary: Negative for dysuria, flank pain and hematuria.  All other systems reviewed and are negative.    Physical Exam Updated Vital Signs BP 121/79   Pulse 90   Temp 98.3 F (36.8 C) (Oral)   Resp 16   Ht 5\' 9"  (1.753 m)   Wt (!) 147.4 kg (325 lb)   SpO2 100%   BMI 47.99 kg/m   Physical Exam  Constitutional: She appears well-developed and well-nourished. No distress.  HENT:  Head: Normocephalic and atraumatic.  Eyes: Conjunctivae are normal. Right eye exhibits no discharge. Left eye exhibits no discharge.  Neck: No JVD present. No tracheal deviation present.  Cardiovascular: Normal rate, regular rhythm and normal heart  sounds.   Pulmonary/Chest: Effort normal and breath sounds normal.  Abdominal: Soft. Bowel sounds are normal. She exhibits no distension and no mass. There is no tenderness. There is no guarding.  Obese, no rigidity or tenderness. Murphy's absent, Rovsing's absent.   Musculoskeletal: She exhibits no edema.  Neurological: She is alert.  Skin: Skin is warm and dry. She is not diaphoretic.  Psychiatric: She has a normal mood and affect. Her behavior is normal.     ED Treatments / Results  Labs (all labs ordered are listed, but only abnormal results are displayed) Labs Reviewed - No data to display  EKG  EKG Interpretation None       Radiology No results found.  Procedures Procedures (including critical care time)  Medications Ordered in ED Medications - No data to display   Initial Impression / Assessment and Plan / ED Course  I have reviewed the triage vital signs and the nursing notes.  Pertinent labs & imaging results that were available during my care of the patient were reviewed by me and considered in my medical decision making (see chart for details).     Patient presents from nursing home sent for resolving constipation. She has had a bowel movement which she states was relatively normal last night. She denies any pain and is in no apparent distress. Vital signs are stable, patient is afebrile. She is tolerating PO fluid and foods while in the ED and in the nursing home. Given physical examination and history, low suspicion of small bowel obstruction, diverticulitis, colitis, appendicitis, or other intra-abdominal pathology. Spoke with Nurse Herbie Baltimore at [facility], who relayed results of x-ray. X-ray report states dilated small bowel loops with colonic residual, but no masses. Reason for transport on paperwork is "mild colonic ileus". No further emergent workup required. She stable for discharge back to her nursing home with continuation of  advancement of diet and continued use of laxatives as needed. She will f/u  with PCP in 3-4 days for re-evaluation. Discussed indications for return to the ED including fever, nausea, vomiting, or persistent constipation. Pt verbalized understanding of and agreement with plan. Patient seen and evaluated by Dr. Lacinda Axon.   Final Clinical Impressions(s) / ED Diagnoses   Final diagnoses:  Constipation in female    New Prescriptions New Prescriptions   No medications on file     Debroah Baller 04/29/17 [place], [place], PA-C 04/29/17 1612    Nat Christen, MD 05/06/17 1044

## 2017-04-30 ENCOUNTER — Other Ambulatory Visit (HOSPITAL_COMMUNITY): Payer: Self-pay | Admitting: Family Medicine

## 2017-04-30 ENCOUNTER — Non-Acute Institutional Stay (SKILLED_NURSING_FACILITY): Payer: Medicare Other

## 2017-04-30 DIAGNOSIS — Z0000 Encounter for general adult medical examination without abnormal findings: Secondary | ICD-10-CM

## 2017-04-30 DIAGNOSIS — K5900 Constipation, unspecified: Secondary | ICD-10-CM

## 2017-04-30 NOTE — Patient Instructions (Signed)
Lauren Barber , Thank you for taking time to come for your Medicare Wellness Visit. I appreciate your ongoing commitment to your health goals. Please review the following plan we discussed and let me know if I can assist you in the future.   Screening recommendations/referrals: Colonoscopy long term pt Mammogram long term pt Bone Density due Recommended yearly ophthalmology/optometry visit for glaucoma screening and checkup Recommended yearly dental visit for hygiene and checkup  Vaccinations: Influenza vaccine due 09/13/2017 Pneumococcal vaccine 13 due Tdap vaccine due Shingles vaccine not in records    Advanced directives: DNR in chart  Conditions/risks identified: None  Next appointment: None upcoming  Preventive Care 40-64 Years, Female Preventive care refers to lifestyle choices and visits with your health care provider that can promote health and wellness. What does preventive care include?  A yearly physical exam. This is also called an annual well check.  Dental exams once or twice a year.  Routine eye exams. Ask your health care provider how often you should have your eyes checked.  Personal lifestyle choices, including:  Daily care of your teeth and gums.  Regular physical activity.  Eating a healthy diet.  Avoiding tobacco and drug use.  Limiting alcohol use.  Practicing safe sex.  Taking low-dose aspirin daily starting at age 56.  Taking vitamin and mineral supplements as recommended by your health care provider. What happens during an annual well check? The services and screenings done by your health care provider during your annual well check will depend on your age, overall health, lifestyle risk factors, and family history of disease. Counseling  Your health care provider may ask you questions about your:  Alcohol use.  Tobacco use.  Drug use.  Emotional well-being.  Home and relationship well-being.  Sexual activity.  Eating  habits.  Work and work Statistician.  Method of birth control.  Menstrual cycle.  Pregnancy history. Screening  You may have the following tests or measurements:  Height, weight, and BMI.  Blood pressure.  Lipid and cholesterol levels. These may be checked every 5 years, or more frequently if you are over 95 years old.  Skin check.  Lung cancer screening. You may have this screening every year starting at age 68 if you have a 30-pack-year history of smoking and currently smoke or have quit within the past 15 years.  Fecal occult blood test (FOBT) of the stool. You may have this test every year starting at age 68.  Flexible sigmoidoscopy or colonoscopy. You may have a sigmoidoscopy every 5 years or a colonoscopy every 10 years starting at age 95.  Hepatitis C blood test.  Hepatitis B blood test.  Sexually transmitted disease (STD) testing.  Diabetes screening. This is done by checking your blood sugar (glucose) after you have not eaten for a while (fasting). You may have this done every 1-3 years.  Mammogram. This may be done every 1-2 years. Talk to your health care provider about when you should start having regular mammograms. This may depend on whether you have a family history of breast cancer.  BRCA-related cancer screening. This may be done if you have a family history of breast, ovarian, tubal, or peritoneal cancers.  Pelvic exam and Pap test. This may be done every 3 years starting at age 63. Starting at age 92, this may be done every 5 years if you have a Pap test in combination with an HPV test.  Bone density scan. This is done to screen for osteoporosis. You may  have this scan if you are at high risk for osteoporosis. Discuss your test results, treatment options, and if necessary, the need for more tests with your health care provider. Vaccines  Your health care provider may recommend certain vaccines, such as:  Influenza vaccine. This is recommended every  year.  Tetanus, diphtheria, and acellular pertussis (Tdap, Td) vaccine. You may need a Td booster every 10 years.  Zoster vaccine. You may need this after age 110.  Pneumococcal 13-valent conjugate (PCV13) vaccine. You may need this if you have certain conditions and were not previously vaccinated.  Pneumococcal polysaccharide (PPSV23) vaccine. You may need one or two doses if you smoke cigarettes or if you have certain conditions. Talk to your health care provider about which screenings and vaccines you need and how often you need them. This information is not intended to replace advice given to you by your health care provider. Make sure you discuss any questions you have with your health care provider. Document Released: 12/02/2015 Document Revised: 07/25/2016 Document Reviewed: 09/06/2015 Elsevier Interactive Patient Education  2017 [place] Prevention in the Home Falls can cause injuries. They can happen to people of all ages. There are many things you can do to make your home safe and to help prevent falls. What can I do on the outside of my home?  Regularly fix the edges of walkways and driveways and fix any cracks.  Remove anything that might make you trip as you walk through a door, such as a raised step or threshold.  Trim any bushes or trees on the path to your home.  Use bright outdoor lighting.  Clear any walking paths of anything that might make someone trip, such as rocks or tools.  Regularly check to see if handrails are loose or broken. Make sure that both sides of any steps have handrails.  Any raised decks and porches should have guardrails on the edges.  Have any leaves, snow, or ice cleared regularly.  Use sand or salt on walking paths during winter.  Clean up any spills in your garage right away. This includes oil or grease spills. What can I do in the bathroom?  Use night lights.  Install grab bars by the toilet and in the tub and shower.  Do not use towel bars as grab bars.  Use non-skid mats or decals in the tub or shower.  If you need to sit down in the shower, use a plastic, non-slip stool.  Keep the floor dry. Clean up any water that spills on the floor as soon as it happens.  Remove soap buildup in the tub or shower regularly.  Attach bath mats securely with double-sided non-slip rug tape.  Do not have throw rugs and other things on the floor that can make you trip. What can I do in the bedroom?  Use night lights.  Make sure that you have a light by your bed that is easy to reach.  Do not use any sheets or blankets that are too big for your bed. They should not hang down onto the floor.  Have a firm chair that has side arms. You can use this for support while you get dressed.  Do not have throw rugs and other things on the floor that can make you trip. What can I do in the kitchen?  Clean up any spills right away.  Avoid walking on wet floors.  Keep items that you use a lot in easy-to-reach  places.  If you need to reach something above you, use a strong step stool that has a grab bar.  Keep electrical cords out of the way.  Do not use floor polish or wax that makes floors slippery. If you must use wax, use non-skid floor wax.  Do not have throw rugs and other things on the floor that can make you trip. What can I do with my stairs?  Do not leave any items on the stairs.  Make sure that there are handrails on both sides of the stairs and use them. Fix handrails that are broken or loose. Make sure that handrails are as long as the stairways.  Check any carpeting to make sure that it is firmly attached to the stairs. Fix any carpet that is loose or worn.  Avoid having throw rugs at the top or bottom of the stairs. If you do have throw rugs, attach them to the floor with carpet tape.  Make sure that you have a light switch at the top of the stairs and the bottom of the stairs. If you do not have them,  ask someone to add them for you. What else can I do to help prevent falls?  Wear shoes that:  Do not have high heels.  Have rubber bottoms.  Are comfortable and fit you well.  Are closed at the toe. Do not wear sandals.  If you use a stepladder:  Make sure that it is fully opened. Do not climb a closed stepladder.  Make sure that both sides of the stepladder are locked into place.  Ask someone to hold it for you, if possible.  Clearly mark and make sure that you can see:  Any grab bars or handrails.  First and last steps.  Where the edge of each step is.  Use tools that help you move around (mobility aids) if they are needed. These include:  Canes.  Walkers.  Scooters.  Crutches.  Turn on the lights when you go into a dark area. Replace any light bulbs as soon as they burn out.  Set up your furniture so you have a clear path. Avoid moving your furniture around.  If any of your floors are uneven, fix them.  If there are any pets around you, be aware of where they are.  Review your medicines with your doctor. Some medicines can make you feel dizzy. This can increase your chance of falling. Ask your doctor what other things that you can do to help prevent falls. This information is not intended to replace advice given to you by your health care provider. Make sure you discuss any questions you have with your health care provider. Document Released: 09/01/2009 Document Revised: 04/12/2016 Document Reviewed: 12/10/2014 Elsevier Interactive Patient Education  2017 [employer].

## 2017-04-30 NOTE — Progress Notes (Signed)
Subjective:   Lauren Barber is a 61 y.o. female who presents for an Initial Medicare Annual Wellness Visit at [facility]      Objective:    Today's Vitals   04/30/17 1543  BP: 120/60  Pulse: 86  Temp: 97.2 F (36.2 C)  TempSrc: Oral  SpO2: 98%  Weight: (!) 325 lb (147.4 kg)  Height: 5\' 9"  (1.753 m)   Body mass index is 47.99 kg/m.   Current Medications (verified) Outpatient Encounter Prescriptions as of 04/30/2017  Medication Sig  . acetaminophen (TYLENOL) 325 MG tablet Take 650 mg by mouth every 6 (six) hours as needed for moderate pain, fever or headache.   . allopurinol (ZYLOPRIM) 100 MG tablet Take 200 mg by mouth daily.  Marland Kitchen amitriptyline (ELAVIL) 100 MG tablet Take 100 mg by mouth at bedtime.  . colchicine 0.6 MG tablet Take 0.6 mg by mouth daily.  . diphenhydrAMINE (BENADRYL) 25 MG tablet Take 25 mg by mouth every 6 (six) hours as needed.  . docusate sodium (COLACE) 100 MG capsule Take 1 capsule (100 mg total) by mouth 2 (two) times daily.  Marland Kitchen EPINEPHrine (EPIPEN) 0.3 mg/0.3 mL DEVI Inject 0.3 mg into the muscle once.  . fenofibrate (TRICOR) 48 MG tablet Take 48 mg by mouth at bedtime.  Marland Kitchen FLUoxetine (PROZAC) 40 MG capsule Take 40 mg by mouth daily.  Marland Kitchen gabapentin (NEURONTIN) 300 MG capsule Take 300 mg by mouth 3 (three) times daily.   . hydrocortisone cream 1 % Apply 1 application topically 2 (two) times daily.  Marland Kitchen levothyroxine (SYNTHROID, LEVOTHROID) 50 MCG tablet Take 1 tablet (50 mcg total) by mouth daily.  Marland Kitchen linagliptin (TRADJENTA) 5 MG TABS tablet Take 5 mg by mouth daily.  . Melatonin 3 MG TABS Take 3 mg by mouth at bedtime.  . methocarbamol (ROBAXIN) 500 MG tablet Take 500 mg by mouth every 8 (eight) hours as needed for muscle spasms. Reported on 04/17/2016  . OxyCODONE HCl, Abuse Deter, (OXAYDO) 5 MG TABA Take 1 tablet by mouth every 6 (six) hours as needed.  . pantoprazole (PROTONIX) 40 MG tablet Take 1 tablet (40 mg total) by mouth daily.  .  polyethylene glycol (MIRALAX / GLYCOLAX) packet Take 17 g by mouth daily.  . polyvinyl alcohol (LIQUIFILM TEARS) 1.4 % ophthalmic solution Place 1 drop into both eyes every 4 (four) hours as needed for dry eyes.  . sodium chloride (OCEAN) 0.65 % SOLN nasal spray Place 1 spray into both nostrils as needed for congestion.  Marland Kitchen spironolactone (ALDACTONE) 25 MG tablet Take 25 mg by mouth daily.  Marland Kitchen tiZANidine (ZANAFLEX) 4 MG tablet Take 4 mg by mouth every 8 (eight) hours as needed for muscle spasms.  Marland Kitchen torsemide (DEMADEX) 20 MG tablet Take 40 mg by mouth daily.   . TRAMADOL HCL PO Take 100 mg by mouth every 6 (six) hours.  . Vitamin D, Ergocalciferol, (DRISDOL) 50000 units CAPS capsule Take 50,000 Units by mouth every 7 (seven) days. Every Wednesday   No facility-administered encounter medications on file as of 04/30/2017.     Allergies (verified) Shellfish allergy   History: Past Medical History:  Diagnosis Date  . Anemia   . Anxiety   . Atopic conjunctivitis   . Cardiomegaly   . Cellulitis    legs  . CVA (cerebral vascular accident) ([place])   . Degenerative, intervertebral disc    back, legs  . Depressive disorder    depressive disorder  . Diabetes mellitus  without complication ([place])   . Endometriosis   . Essential hypertension 04/12/2007   Qualifier: Diagnosis of  By: Jobe Igo MD, Shanon Brow    . Gout    feet  . Headache(784.0)    otc meds prn  . History of blood transfusion [place] - unsure number of units transfused  . Hypertension   . Insomnia   . Neuromuscular disorder ([place])    diabetic neuropathy - feet  . Obesities, morbid ([place])   . Stroke [facility]) 2013  . Vaginal bleeding    Past Surgical History:  Procedure Laterality Date  . BIOPSY ENDOMETRIAL N/A 10 03 2013  . [place]   x 2  . DILATION AND CURETTAGE OF UTERUS  2013  . HYSTEROSCOPY W/D&C N/A 06/28/2014   Procedure: DILATATION AND CURETTAGE /HYSTEROSCOPY;  Surgeon: Lavonia Drafts, MD;   Location: [facility];  Service: Gynecology;  Laterality: N/A;  . TONSILLECTOMY    . WISDOM TOOTH EXTRACTION     Family History  Problem Relation Age of Onset  . Hypertension Mother   . Hypertension Father    Social History   Occupational History  . Not on file.   Social History Main Topics  . Smoking status: Former Smoker    Packs/day: 1.00    Years: 43.00    Types: Cigarettes    Quit date: 06/11/2012  . Smokeless tobacco: Never Used  . Alcohol use No  . Drug use: No  . Sexual activity: No    Tobacco Counseling Counseling given: Not Answered   Activities of Daily Living In your present state of health, do you have any difficulty performing the following activities: 04/30/2017  Hearing? Y  Vision? N  Difficulty concentrating or making decisions? Y  Walking or climbing stairs? Y  Dressing or bathing? Y  Doing errands, shopping? Y  Preparing Food and eating ? Y  Using the Toilet? Y  In the past six months, have you accidently leaked urine? Y  Do you have problems with loss of bowel control? Y  Managing your Medications? Y  Managing your Finances? Y  Housekeeping or managing your Housekeeping? Y  Some recent data might be hidden    Immunizations and Health Maintenance Immunization History  Administered Date(s) Administered  . Influenza Whole 09/09/2013  . Influenza-Unspecified 09/01/2014, 08/12/2015, 09/13/2016  . Pneumococcal Polysaccharide-23 12/16/2014  . Td 07/22/2006   Health Maintenance Due  Topic Date Due  . Hepatitis C Screening  1956/01/10  . URINE MICROALBUMIN  03/19/2017  . OPHTHALMOLOGY EXAM  04/20/2017    Patient Care Team: Gildardo Cranker, DO as PCP - General (Internal Medicine) Nyoka Cowden Phylis Bougie, NP as Nurse Practitioner (Nurse Practitioner) [facility], [place] ([place])  Indicate any recent Medical Services you may have received from other than [facility] providers in the past year (date may be approximate).     Assessment:    This is a routine wellness examination for Lauren Barber.   Hearing/Vision screen No exam data present  Dietary issues and exercise activities discussed: Current Exercise Habits: The patient does not participate in regular exercise at present, Exercise limited by: orthopedic condition(s)  Goals    None     Depression Screen PHQ 2/9 Scores 04/30/2017  PHQ - 2 Score 6  PHQ- 9 Score 9    Fall Risk Fall Risk  04/30/2017  Falls in the past year? No    Cognitive Function:     6CIT Screen 04/30/2017  What Year? 0  points  What month? 0 points  What time? 0 points  Count back from 20 0 points  Months in reverse 4 points  Repeat phrase 2 points  Total Score 6    Screening Tests Health Maintenance  Topic Date Due  . Hepatitis C Screening  Jan 18, 1956  . URINE MICROALBUMIN  03/19/2017  . OPHTHALMOLOGY EXAM  04/20/2017  . TETANUS/TDAP  07/27/2017 (Originally 07/22/2016)  . FOOT EXAM  06/12/2017  . INFLUENZA VACCINE  06/19/2017  . HEMOGLOBIN A1C  08/20/2017  . PNEUMOCOCCAL POLYSACCHARIDE VACCINE (2) 12/17/2019  . COLONOSCOPY  06/05/2025  . HIV Screening  Completed  . MAMMOGRAM  Excluded  . PAP SMEAR  Excluded      Plan:    I have personally reviewed and addressed the Medicare Annual Wellness questionnaire and have noted the following in the patient's chart:  A. Medical and social history B. Use of alcohol, tobacco or illicit drugs  C. Current medications and supplements D. Functional ability and status E.  Nutritional status F.  Physical activity G. Advance directives H. List of other physicians I.  Hospitalizations, surgeries, and ER visits in previous 12 months J.  [place] to include hearing, vision, cognitive, depression L. Referrals and appointments - none  In addition, I have reviewed and discussed with patient certain preventive protocols, quality metrics, and best practice recommendations. A written personalized care plan for preventive services as well  as general preventive health recommendations were provided to patient.  See attached scanned questionnaire for additional information.   Signed,   Rich Reining, RN Nurse Health Advisor   Quick Notes   Health Maintenance: PNA 13 due. DEXA due. Eye exam due, request already in chart     Abnormal Screen: 6 CIT-6 PHQ-9     Patient Concerns: pt c/o hearing diffculties and need for hearing aid, referral for opthamology     Nurse Concerns: None

## 2017-05-06 ENCOUNTER — Ambulatory Visit (HOSPITAL_COMMUNITY): Payer: Medicare Other

## 2017-05-09 ENCOUNTER — Ambulatory Visit (HOSPITAL_COMMUNITY)
Admission: RE | Admit: 2017-05-09 | Discharge: 2017-05-09 | Disposition: A | Discharge status: Home / Self Care | Payer: Medicare Other | Source: Ambulatory Visit | Attending: Family Medicine | Admitting: Family Medicine

## 2017-05-09 ENCOUNTER — Other Ambulatory Visit (HOSPITAL_COMMUNITY): Payer: Self-pay | Admitting: Family Medicine

## 2017-05-09 DIAGNOSIS — K760 Fatty (change of) liver, not elsewhere classified: Secondary | ICD-10-CM | POA: Insufficient documentation

## 2017-05-09 DIAGNOSIS — K5900 Constipation, unspecified: Secondary | ICD-10-CM | POA: Insufficient documentation

## 2017-05-09 DIAGNOSIS — I700 Atherosclerosis of aorta: Secondary | ICD-10-CM | POA: Insufficient documentation

## 2017-05-09 MED ORDER — IOPAMIDOL (ISOVUE-300) INJECTION 61%: 75.0000 mL | Freq: Once | INTRAVENOUS | Status: DC | PRN

## 2017-05-09 MED ORDER — IOPAMIDOL (ISOVUE-300) INJECTION 61%
INTRAVENOUS | Status: AC
  Filled 2017-05-09: qty 75

## 2017-05-20 ENCOUNTER — Ambulatory Visit (HOSPITAL_COMMUNITY): Payer: Medicare Other

## 2017-09-25 ENCOUNTER — Observation Stay (HOSPITAL_COMMUNITY)
Admission: EM | Admit: 2017-09-25 | Discharge: 2017-09-27 | Disposition: A | Discharge status: Skilled Nursing Facility | Payer: Medicare Other | Attending: Family Medicine | Admitting: Family Medicine

## 2017-09-25 ENCOUNTER — Other Ambulatory Visit: Payer: Self-pay

## 2017-09-25 ENCOUNTER — Encounter (HOSPITAL_COMMUNITY): Payer: Self-pay

## 2017-09-25 DIAGNOSIS — F32A Depression, unspecified: Secondary | ICD-10-CM

## 2017-09-25 DIAGNOSIS — N183 Chronic kidney disease, stage 3 unspecified: Secondary | ICD-10-CM | POA: Diagnosis present

## 2017-09-25 DIAGNOSIS — Z79899 Other long term (current) drug therapy: Secondary | ICD-10-CM | POA: Diagnosis not present

## 2017-09-25 DIAGNOSIS — M6208 Separation of muscle (nontraumatic), other site: Secondary | ICD-10-CM | POA: Insufficient documentation

## 2017-09-25 DIAGNOSIS — D72829 Elevated white blood cell count, unspecified: Secondary | ICD-10-CM

## 2017-09-25 DIAGNOSIS — M109 Gout, unspecified: Secondary | ICD-10-CM | POA: Insufficient documentation

## 2017-09-25 DIAGNOSIS — K635 Polyp of colon: Secondary | ICD-10-CM

## 2017-09-25 DIAGNOSIS — G8929 Other chronic pain: Secondary | ICD-10-CM

## 2017-09-25 DIAGNOSIS — Z886 Allergy status to analgesic agent status: Secondary | ICD-10-CM | POA: Insufficient documentation

## 2017-09-25 DIAGNOSIS — F329 Major depressive disorder, single episode, unspecified: Secondary | ICD-10-CM

## 2017-09-25 DIAGNOSIS — K5900 Constipation, unspecified: Secondary | ICD-10-CM | POA: Insufficient documentation

## 2017-09-25 DIAGNOSIS — M793 Panniculitis, unspecified: Secondary | ICD-10-CM

## 2017-09-25 DIAGNOSIS — K6389 Other specified diseases of intestine: Secondary | ICD-10-CM

## 2017-09-25 DIAGNOSIS — I129 Hypertensive chronic kidney disease with stage 1 through stage 4 chronic kidney disease, or unspecified chronic kidney disease: Secondary | ICD-10-CM | POA: Insufficient documentation

## 2017-09-25 DIAGNOSIS — Z7401 Bed confinement status: Secondary | ICD-10-CM | POA: Diagnosis not present

## 2017-09-25 DIAGNOSIS — R140 Abdominal distension (gaseous): Secondary | ICD-10-CM | POA: Diagnosis not present

## 2017-09-25 DIAGNOSIS — R109 Unspecified abdominal pain: Secondary | ICD-10-CM | POA: Diagnosis not present

## 2017-09-25 DIAGNOSIS — I700 Atherosclerosis of aorta: Secondary | ICD-10-CM | POA: Insufficient documentation

## 2017-09-25 DIAGNOSIS — E1143 Type 2 diabetes mellitus with diabetic autonomic (poly)neuropathy: Secondary | ICD-10-CM | POA: Diagnosis present

## 2017-09-25 DIAGNOSIS — K5909 Other constipation: Secondary | ICD-10-CM | POA: Diagnosis present

## 2017-09-25 DIAGNOSIS — N83202 Unspecified ovarian cyst, left side: Secondary | ICD-10-CM | POA: Insufficient documentation

## 2017-09-25 DIAGNOSIS — Z8673 Personal history of transient ischemic attack (TIA), and cerebral infarction without residual deficits: Secondary | ICD-10-CM | POA: Insufficient documentation

## 2017-09-25 DIAGNOSIS — K760 Fatty (change of) liver, not elsewhere classified: Secondary | ICD-10-CM | POA: Insufficient documentation

## 2017-09-25 DIAGNOSIS — N170 Acute kidney failure with tubular necrosis: Secondary | ICD-10-CM

## 2017-09-25 DIAGNOSIS — K439 Ventral hernia without obstruction or gangrene: Secondary | ICD-10-CM

## 2017-09-25 DIAGNOSIS — D124 Benign neoplasm of descending colon: Secondary | ICD-10-CM

## 2017-09-25 DIAGNOSIS — K660 Peritoneal adhesions (postprocedural) (postinfection): Principal | ICD-10-CM | POA: Insufficient documentation

## 2017-09-25 DIAGNOSIS — N852 Hypertrophy of uterus: Secondary | ICD-10-CM | POA: Diagnosis not present

## 2017-09-25 DIAGNOSIS — E1122 Type 2 diabetes mellitus with diabetic chronic kidney disease: Secondary | ICD-10-CM | POA: Diagnosis not present

## 2017-09-25 DIAGNOSIS — G4700 Insomnia, unspecified: Secondary | ICD-10-CM | POA: Insufficient documentation

## 2017-09-25 DIAGNOSIS — N390 Urinary tract infection, site not specified: Secondary | ICD-10-CM

## 2017-09-25 DIAGNOSIS — Z91013 Allergy to seafood: Secondary | ICD-10-CM | POA: Insufficient documentation

## 2017-09-25 DIAGNOSIS — Z87891 Personal history of nicotine dependence: Secondary | ICD-10-CM | POA: Insufficient documentation

## 2017-09-25 DIAGNOSIS — K5939 Other megacolon: Secondary | ICD-10-CM | POA: Diagnosis not present

## 2017-09-25 DIAGNOSIS — Z6841 Body Mass Index (BMI) 40.0 and over, adult: Secondary | ICD-10-CM | POA: Insufficient documentation

## 2017-09-25 DIAGNOSIS — E66813 Obesity, class 3: Secondary | ICD-10-CM | POA: Diagnosis present

## 2017-09-25 DIAGNOSIS — F419 Anxiety disorder, unspecified: Secondary | ICD-10-CM | POA: Insufficient documentation

## 2017-09-25 HISTORY — DX: Cellulitis of right lower limb: L03.115

## 2017-09-25 NOTE — ED Triage Notes (Signed)
Patient brought in by EMS from [facility] with complaints of sudden abdominal pain that radiates to her lower back and distention. Patient reports last normal BM last night. No nausea/vomiting. Patient has hx of renal failure with no dialysis.

## 2017-09-25 NOTE — ED Notes (Signed)
Bed: UP10 Expected date:  Expected time:  Means of arrival:  Comments: eMS

## 2017-09-26 ENCOUNTER — Observation Stay (HOSPITAL_COMMUNITY): Payer: Medicare Other | Admitting: Certified Registered Nurse Anesthetist

## 2017-09-26 ENCOUNTER — Emergency Department (HOSPITAL_COMMUNITY): Payer: Medicare Other

## 2017-09-26 ENCOUNTER — Encounter (HOSPITAL_COMMUNITY)
Admission: EM | Disposition: A | Discharge status: Skilled Nursing Facility | Payer: Self-pay | Source: Home / Self Care | Attending: Emergency Medicine

## 2017-09-26 ENCOUNTER — Encounter (HOSPITAL_COMMUNITY): Payer: Self-pay | Admitting: Surgery

## 2017-09-26 DIAGNOSIS — K6389 Other specified diseases of intestine: Secondary | ICD-10-CM

## 2017-09-26 DIAGNOSIS — K439 Ventral hernia without obstruction or gangrene: Secondary | ICD-10-CM | POA: Diagnosis not present

## 2017-09-26 DIAGNOSIS — Z7401 Bed confinement status: Secondary | ICD-10-CM

## 2017-09-26 DIAGNOSIS — K660 Peritoneal adhesions (postprocedural) (postinfection): Secondary | ICD-10-CM | POA: Diagnosis not present

## 2017-09-26 DIAGNOSIS — M793 Panniculitis, unspecified: Secondary | ICD-10-CM

## 2017-09-26 DIAGNOSIS — F32A Depression, unspecified: Secondary | ICD-10-CM | POA: Insufficient documentation

## 2017-09-26 DIAGNOSIS — F329 Major depressive disorder, single episode, unspecified: Secondary | ICD-10-CM | POA: Insufficient documentation

## 2017-09-26 DIAGNOSIS — K5939 Other megacolon: Secondary | ICD-10-CM

## 2017-09-26 DIAGNOSIS — G8929 Other chronic pain: Secondary | ICD-10-CM

## 2017-09-26 DIAGNOSIS — D72829 Elevated white blood cell count, unspecified: Secondary | ICD-10-CM

## 2017-09-26 DIAGNOSIS — M6208 Separation of muscle (nontraumatic), other site: Secondary | ICD-10-CM | POA: Insufficient documentation

## 2017-09-26 DIAGNOSIS — K5909 Other constipation: Secondary | ICD-10-CM | POA: Diagnosis not present

## 2017-09-26 DIAGNOSIS — R109 Unspecified abdominal pain: Secondary | ICD-10-CM

## 2017-09-26 HISTORY — PX: INCISIONAL HERNIA REPAIR: SHX193

## 2017-09-26 LAB — COMPREHENSIVE METABOLIC PANEL
ALBUMIN: 3.7 g/dL (ref 3.5–5.0)
ALK PHOS: 67 U/L (ref 38–126)
ALT: 31 U/L (ref 14–54)
ANION GAP: 11 (ref 5–15)
AST: 36 U/L (ref 15–41)
BILIRUBIN TOTAL: 0.5 mg/dL (ref 0.3–1.2)
BUN: 39 mg/dL — ABNORMAL HIGH (ref 6–20)
CALCIUM: 9.3 mg/dL (ref 8.9–10.3)
CO2: 25 mmol/L (ref 22–32)
CREATININE: 2.11 mg/dL — AB (ref 0.44–1.00)
Chloride: 100 mmol/L — ABNORMAL LOW (ref 101–111)
GFR calc Af Amer: 28 mL/min — ABNORMAL LOW (ref >60)
GFR calc non Af Amer: 25 mL/min — ABNORMAL LOW (ref >60)
GLUCOSE: 161 mg/dL — AB (ref 65–99)
Potassium: 4.5 mmol/L (ref 3.5–5.1)
Sodium: 136 mmol/L (ref 135–145)
TOTAL PROTEIN: 8.1 g/dL (ref 6.5–8.1)

## 2017-09-26 LAB — CBC WITH DIFFERENTIAL/PLATELET
BASOS PCT: 0 %
Basophils Absolute: 0 10*3/uL (ref 0.0–0.1)
Eosinophils Absolute: 0.1 10*3/uL (ref 0.0–0.7)
Eosinophils Relative: 0 %
HEMATOCRIT: 32.2 % — AB (ref 36.0–46.0)
HEMOGLOBIN: 10.4 g/dL — AB (ref 12.0–15.0)
LYMPHS ABS: 2.4 10*3/uL (ref 0.7–4.0)
Lymphocytes Relative: 14 %
MCH: 31.6 pg (ref 26.0–34.0)
MCHC: 32.3 g/dL (ref 30.0–36.0)
MCV: 97.9 fL (ref 78.0–100.0)
MONOS PCT: 3 %
Monocytes Absolute: 0.5 10*3/uL (ref 0.1–1.0)
NEUTROS ABS: 13.7 10*3/uL — AB (ref 1.7–7.7)
NEUTROS PCT: 83 %
Platelets: 313 10*3/uL (ref 150–400)
RBC: 3.29 MIL/uL — ABNORMAL LOW (ref 3.87–5.11)
RDW: 16 % — ABNORMAL HIGH (ref 11.5–15.5)
WBC: 16.6 10*3/uL — ABNORMAL HIGH (ref 4.0–10.5)

## 2017-09-26 LAB — URINALYSIS, ROUTINE W REFLEX MICROSCOPIC
Bilirubin Urine: NEGATIVE
Glucose, UA: NEGATIVE mg/dL
HGB URINE DIPSTICK: NEGATIVE
Ketones, ur: NEGATIVE mg/dL
Nitrite: NEGATIVE
Protein, ur: NEGATIVE mg/dL
SPECIFIC GRAVITY, URINE: 1.011 (ref 1.005–1.030)
pH: 5 (ref 5.0–8.0)

## 2017-09-26 LAB — GLUCOSE, CAPILLARY
Glucose-Capillary: 147 mg/dL — ABNORMAL HIGH (ref 65–99)
Glucose-Capillary: 161 mg/dL — ABNORMAL HIGH (ref 65–99)

## 2017-09-26 LAB — TYPE AND SCREEN
ABO/RH(D): O POS
Antibody Screen: NEGATIVE

## 2017-09-26 LAB — LIPASE, BLOOD: LIPASE: 27 U/L (ref 11–51)

## 2017-09-26 SURGERY — REPAIR, HERNIA, INCISIONAL, LAPAROSCOPIC
Anesthesia: General | Site: Abdomen

## 2017-09-26 MED ORDER — ONDANSETRON HCL 4 MG/2ML IJ SOLN
INTRAMUSCULAR | Status: DC | PRN
  Administered 2017-09-26: 4 mg via INTRAVENOUS

## 2017-09-26 MED ORDER — MIDAZOLAM HCL 2 MG/2ML IJ SOLN
INTRAMUSCULAR | Status: AC
  Administered 2017-09-26: 1 mg via INTRAVENOUS
  Filled 2017-09-26: qty 2

## 2017-09-26 MED ORDER — ROCURONIUM BROMIDE 50 MG/5ML IV SOSY
PREFILLED_SYRINGE | INTRAVENOUS | Status: AC
  Filled 2017-09-26: qty 5

## 2017-09-26 MED ORDER — HYDROMORPHONE HCL 1 MG/ML IJ SOLN: 0.2500 mg | INTRAMUSCULAR | Status: DC | PRN

## 2017-09-26 MED ORDER — PROCHLORPERAZINE EDISYLATE 5 MG/ML IJ SOLN: 5.0000 mg | INTRAMUSCULAR | Status: DC | PRN

## 2017-09-26 MED ORDER — GUAIFENESIN-DM 100-10 MG/5ML PO SYRP: 10.0000 mL | ORAL_SOLUTION | ORAL | Status: DC | PRN

## 2017-09-26 MED ORDER — ONDANSETRON HCL 4 MG/2ML IJ SOLN: 4.0000 mg | Freq: Four times a day (QID) | INTRAMUSCULAR | Status: DC | PRN

## 2017-09-26 MED ORDER — METHOCARBAMOL 500 MG PO TABS: 1000.0000 mg | ORAL_TABLET | Freq: Four times a day (QID) | ORAL | Status: DC | PRN

## 2017-09-26 MED ORDER — GABAPENTIN 300 MG PO CAPS
300.0000 mg | ORAL_CAPSULE | Freq: Every day | ORAL | Status: DC
  Administered 2017-09-26: 23:00:00 300 mg via ORAL
  Filled 2017-09-26 (×2): qty 1

## 2017-09-26 MED ORDER — MENTHOL 3 MG MT LOZG: 1.0000 | LOZENGE | OROMUCOSAL | Status: DC | PRN

## 2017-09-26 MED ORDER — MIDAZOLAM HCL 2 MG/2ML IJ SOLN
2.0000 mg | Freq: Once | INTRAMUSCULAR | Status: AC
  Administered 2017-09-26: 1 mg via INTRAVENOUS

## 2017-09-26 MED ORDER — SODIUM CHLORIDE 0.9 % IV SOLN: Freq: Once | INTRAVENOUS | Status: DC

## 2017-09-26 MED ORDER — METHOCARBAMOL 1000 MG/10ML IJ SOLN
500.0000 mg | Freq: Four times a day (QID) | INTRAVENOUS | Status: DC | PRN
  Filled 2017-09-26: qty 5

## 2017-09-26 MED ORDER — LIDOCAINE 2% (20 MG/ML) 5 ML SYRINGE
INTRAMUSCULAR | Status: DC | PRN
  Administered 2017-09-26: 50 mg via INTRAVENOUS

## 2017-09-26 MED ORDER — FENTANYL CITRATE (PF) 100 MCG/2ML IJ SOLN
INTRAMUSCULAR | Status: AC
  Administered 2017-09-26: 50 ug via INTRAVENOUS
  Filled 2017-09-26: qty 2

## 2017-09-26 MED ORDER — GABAPENTIN 300 MG PO CAPS: 300.0000 mg | ORAL_CAPSULE | ORAL | Status: DC

## 2017-09-26 MED ORDER — POLYETHYLENE GLYCOL 3350 17 G PO PACK
34.0000 g | PACK | Freq: Three times a day (TID) | ORAL | Status: DC
  Administered 2017-09-26 – 2017-09-27 (×2): 34 g via ORAL
  Filled 2017-09-26 (×3): qty 2

## 2017-09-26 MED ORDER — PANTOPRAZOLE SODIUM 40 MG PO TBEC
40.0000 mg | DELAYED_RELEASE_TABLET | Freq: Every day | ORAL | Status: DC
  Administered 2017-09-26 – 2017-09-27 (×2): 40 mg via ORAL
  Filled 2017-09-26 (×2): qty 1

## 2017-09-26 MED ORDER — CEFAZOLIN SODIUM-DEXTROSE 2-4 GM/100ML-% IV SOLN
2.0000 g | Freq: Three times a day (TID) | INTRAVENOUS | Status: AC
  Administered 2017-09-26 – 2017-09-27 (×2): 2 g via INTRAVENOUS
  Filled 2017-09-26 (×2): qty 100

## 2017-09-26 MED ORDER — SENNOSIDES-DOCUSATE SODIUM 8.6-50 MG PO TABS
2.0000 | ORAL_TABLET | Freq: Two times a day (BID) | ORAL | Status: DC
  Administered 2017-09-26: 23:00:00 2 via ORAL
  Filled 2017-09-26 (×3): qty 2

## 2017-09-26 MED ORDER — FENTANYL CITRATE (PF) 100 MCG/2ML IJ SOLN
50.0000 ug | Freq: Once | INTRAMUSCULAR | Status: AC
  Administered 2017-09-26: 50 ug via INTRAVENOUS
  Filled 2017-09-26: qty 2

## 2017-09-26 MED ORDER — METRONIDAZOLE IN NACL 5-0.79 MG/ML-% IV SOLN
500.0000 mg | INTRAVENOUS | Status: AC
  Administered 2017-09-26: 500 mg via INTRAVENOUS
  Filled 2017-09-26: qty 100

## 2017-09-26 MED ORDER — DIPHENHYDRAMINE HCL 50 MG/ML IJ SOLN: 12.5000 mg | Freq: Four times a day (QID) | INTRAMUSCULAR | Status: DC | PRN

## 2017-09-26 MED ORDER — SODIUM CHLORIDE 0.9 % IV SOLN
INTRAVENOUS | Status: DC
  Filled 2017-09-26: qty 6

## 2017-09-26 MED ORDER — CELECOXIB 200 MG PO CAPS: 200.0000 mg | ORAL_CAPSULE | ORAL | Status: DC

## 2017-09-26 MED ORDER — LEVOTHYROXINE SODIUM 50 MCG PO TABS
50.0000 ug | ORAL_TABLET | Freq: Every day | ORAL | Status: DC
  Administered 2017-09-26: 50 ug via ORAL
  Filled 2017-09-26 (×2): qty 1

## 2017-09-26 MED ORDER — SUCCINYLCHOLINE CHLORIDE 200 MG/10ML IV SOSY
PREFILLED_SYRINGE | INTRAVENOUS | Status: DC | PRN
  Administered 2017-09-26: 160 mg via INTRAVENOUS

## 2017-09-26 MED ORDER — BUPIVACAINE-EPINEPHRINE 0.25% -1:200000 IJ SOLN
INTRAMUSCULAR | Status: AC
  Filled 2017-09-26: qty 1

## 2017-09-26 MED ORDER — GABAPENTIN 300 MG PO CAPS
300.0000 mg | ORAL_CAPSULE | Freq: Three times a day (TID) | ORAL | Status: DC
  Administered 2017-09-26 – 2017-09-27 (×2): 300 mg via ORAL
  Filled 2017-09-26 (×2): qty 1

## 2017-09-26 MED ORDER — FLUOXETINE HCL 20 MG PO CAPS
60.0000 mg | ORAL_CAPSULE | Freq: Every day | ORAL | Status: DC
  Administered 2017-09-26 – 2017-09-27 (×2): 60 mg via ORAL
  Filled 2017-09-26 (×2): qty 3

## 2017-09-26 MED ORDER — GI COCKTAIL ~~LOC~~
30.0000 mL | Freq: Once | ORAL | Status: AC
  Administered 2017-09-26: 30 mL via ORAL
  Filled 2017-09-26: qty 30

## 2017-09-26 MED ORDER — SUGAMMADEX SODIUM 500 MG/5ML IV SOLN
INTRAVENOUS | Status: AC
  Filled 2017-09-26: qty 5

## 2017-09-26 MED ORDER — MORPHINE SULFATE (PF) 4 MG/ML IV SOLN: 1.0000 mg | INTRAVENOUS | Status: DC | PRN

## 2017-09-26 MED ORDER — LACTATED RINGERS IV BOLUS (SEPSIS): 1000.0000 mL | Freq: Three times a day (TID) | INTRAVENOUS | Status: DC | PRN

## 2017-09-26 MED ORDER — DEXAMETHASONE SODIUM PHOSPHATE 10 MG/ML IJ SOLN
INTRAMUSCULAR | Status: DC | PRN
  Administered 2017-09-26: 10 mg via INTRAVENOUS

## 2017-09-26 MED ORDER — PROPOFOL 10 MG/ML IV BOLUS
INTRAVENOUS | Status: DC | PRN
  Administered 2017-09-26: 150 mg via INTRAVENOUS

## 2017-09-26 MED ORDER — POLYETHYLENE GLYCOL 3350 17 G PO PACK: 17.0000 g | PACK | Freq: Two times a day (BID) | ORAL | Status: DC | PRN

## 2017-09-26 MED ORDER — HYDROCORTISONE 2.5 % RE CREA
1.0000 | TOPICAL_CREAM | Freq: Four times a day (QID) | RECTAL | Status: DC | PRN
Start: 2017-09-26 — End: 2017-09-27
  Filled 2017-09-26: qty 28.35

## 2017-09-26 MED ORDER — SUGAMMADEX SODIUM 500 MG/5ML IV SOLN
INTRAVENOUS | Status: DC | PRN
  Administered 2017-09-26: 500 mg via INTRAVENOUS

## 2017-09-26 MED ORDER — BUPIVACAINE-EPINEPHRINE 0.25% -1:200000 IJ SOLN
INTRAMUSCULAR | Status: DC | PRN
  Administered 2017-09-26: 10 mL

## 2017-09-26 MED ORDER — DEXTROSE 5 % IV SOLN
1.0000 g | Freq: Once | INTRAVENOUS | Status: AC
  Administered 2017-09-26: 1 g via INTRAVENOUS
  Filled 2017-09-26: qty 10

## 2017-09-26 MED ORDER — BISACODYL 10 MG RE SUPP: 10.0000 mg | Freq: Two times a day (BID) | RECTAL | Status: DC | PRN

## 2017-09-26 MED ORDER — HYDRALAZINE HCL 20 MG/ML IJ SOLN: 5.0000 mg | Freq: Four times a day (QID) | INTRAMUSCULAR | Status: DC | PRN

## 2017-09-26 MED ORDER — ACETAMINOPHEN 10 MG/ML IV SOLN
1000.0000 mg | Freq: Once | INTRAVENOUS | Status: AC
  Administered 2017-09-26: 1000 mg via INTRAVENOUS

## 2017-09-26 MED ORDER — SODIUM CHLORIDE 0.9 % IV SOLN: INTRAVENOUS | Status: DC

## 2017-09-26 MED ORDER — SUCCINYLCHOLINE CHLORIDE 200 MG/10ML IV SOSY
PREFILLED_SYRINGE | INTRAVENOUS | Status: AC
  Filled 2017-09-26: qty 10

## 2017-09-26 MED ORDER — SODIUM CHLORIDE 0.9 % IR SOLN
Status: DC | PRN
  Administered 2017-09-26: 1000 mL

## 2017-09-26 MED ORDER — PHENOL 1.4 % MT LIQD: 1.0000 | OROMUCOSAL | Status: DC | PRN

## 2017-09-26 MED ORDER — NYSTATIN 100000 UNIT/GM EX POWD
Freq: Two times a day (BID) | CUTANEOUS | Status: DC
  Administered 2017-09-26 – 2017-09-27 (×2): via TOPICAL
  Filled 2017-09-26: qty 15

## 2017-09-26 MED ORDER — AMITRIPTYLINE HCL 100 MG PO TABS
100.0000 mg | ORAL_TABLET | Freq: Every day | ORAL | Status: DC
  Administered 2017-09-26: 23:00:00 100 mg via ORAL
  Filled 2017-09-26 (×2): qty 1

## 2017-09-26 MED ORDER — ONDANSETRON HCL 4 MG/2ML IJ SOLN: 4.0000 mg | Freq: Once | INTRAMUSCULAR | Status: DC | PRN

## 2017-09-26 MED ORDER — DEXTROSE 5 % IV SOLN
3.0000 g | INTRAVENOUS | Status: AC
  Administered 2017-09-26: 3 g via INTRAVENOUS
  Filled 2017-09-26: qty 3

## 2017-09-26 MED ORDER — MAGIC MOUTHWASH
15.0000 mL | Freq: Four times a day (QID) | ORAL | Status: DC | PRN
  Filled 2017-09-26: qty 15

## 2017-09-26 MED ORDER — PROPOFOL 10 MG/ML IV BOLUS
INTRAVENOUS | Status: AC
  Filled 2017-09-26: qty 40

## 2017-09-26 MED ORDER — FENTANYL CITRATE (PF) 100 MCG/2ML IJ SOLN
100.0000 ug | Freq: Once | INTRAMUSCULAR | Status: AC
  Administered 2017-09-26: 50 ug via INTRAVENOUS

## 2017-09-26 MED ORDER — METHOCARBAMOL 1000 MG/10ML IJ SOLN: 1000.0000 mg | Freq: Four times a day (QID) | INTRAVENOUS | Status: DC | PRN

## 2017-09-26 MED ORDER — OXYCODONE HCL 5 MG PO TABS: 5.0000 mg | ORAL_TABLET | ORAL | Status: DC | PRN

## 2017-09-26 MED ORDER — ROCURONIUM BROMIDE 10 MG/ML (PF) SYRINGE
PREFILLED_SYRINGE | INTRAVENOUS | Status: DC | PRN
  Administered 2017-09-26: 40 mg via INTRAVENOUS
  Administered 2017-09-26: 30 mg via INTRAVENOUS

## 2017-09-26 MED ORDER — LACTATED RINGERS IV SOLN
INTRAVENOUS | Status: DC
  Administered 2017-09-26 (×3): via INTRAVENOUS

## 2017-09-26 MED ORDER — METOPROLOL TARTRATE 5 MG/5ML IV SOLN: 5.0000 mg | Freq: Four times a day (QID) | INTRAVENOUS | Status: DC | PRN

## 2017-09-26 MED ORDER — PSYLLIUM 95 % PO PACK
1.0000 | PACK | Freq: Two times a day (BID) | ORAL | Status: DC
  Administered 2017-09-26 – 2017-09-27 (×2): 1 via ORAL
  Filled 2017-09-26 (×3): qty 1

## 2017-09-26 MED ORDER — CLINDAMYCIN PHOSPHATE 900 MG/6ML IJ SOLN
INTRAMUSCULAR | Status: DC | PRN
  Administered 2017-09-26: 900 mg via INTRAMUSCULAR

## 2017-09-26 MED ORDER — HYDROMORPHONE HCL 1 MG/ML IJ SOLN: 0.5000 mg | INTRAMUSCULAR | Status: DC | PRN

## 2017-09-26 MED ORDER — ACETAMINOPHEN 500 MG PO TABS: 1000.0000 mg | ORAL_TABLET | ORAL | Status: DC

## 2017-09-26 MED ORDER — LIP MEDEX EX OINT
1.0000 "application " | TOPICAL_OINTMENT | Freq: Two times a day (BID) | CUTANEOUS | Status: DC
  Administered 2017-09-26: 1 via TOPICAL
  Filled 2017-09-26: qty 7

## 2017-09-26 MED ORDER — LEVOTHYROXINE SODIUM 100 MCG IV SOLR
25.0000 ug | Freq: Every day | INTRAVENOUS | Status: DC
  Filled 2017-09-26: qty 5

## 2017-09-26 MED ORDER — BUPIVACAINE LIPOSOME 1.3 % IJ SUSP
20.0000 mL | INTRAMUSCULAR | Status: DC
  Filled 2017-09-26: qty 20

## 2017-09-26 MED ORDER — ACETAMINOPHEN 10 MG/ML IV SOLN
INTRAVENOUS | Status: AC
  Filled 2017-09-26: qty 100

## 2017-09-26 MED ORDER — LIDOCAINE 2% (20 MG/ML) 5 ML SYRINGE
INTRAMUSCULAR | Status: AC
  Filled 2017-09-26: qty 5

## 2017-09-26 MED ORDER — ALUM & MAG HYDROXIDE-SIMETH 200-200-20 MG/5ML PO SUSP: 30.0000 mL | Freq: Four times a day (QID) | ORAL | Status: DC | PRN

## 2017-09-26 MED ORDER — MORPHINE SULFATE (PF) 2 MG/ML IV SOLN: 1.0000 mg | INTRAVENOUS | Status: DC | PRN

## 2017-09-26 MED ORDER — MEPERIDINE HCL 50 MG/ML IJ SOLN: 6.2500 mg | INTRAMUSCULAR | Status: DC | PRN

## 2017-09-26 MED ORDER — FENTANYL CITRATE (PF) 100 MCG/2ML IJ SOLN
INTRAMUSCULAR | Status: AC
  Filled 2017-09-26: qty 2

## 2017-09-26 MED ORDER — ALLOPURINOL 100 MG PO TABS
200.0000 mg | ORAL_TABLET | Freq: Every day | ORAL | Status: DC
  Administered 2017-09-26 – 2017-09-27 (×2): 200 mg via ORAL
  Filled 2017-09-26 (×2): qty 2

## 2017-09-26 MED ORDER — DIPHENHYDRAMINE HCL 25 MG PO CAPS: 25.0000 mg | ORAL_CAPSULE | Freq: Four times a day (QID) | ORAL | Status: DC | PRN

## 2017-09-26 MED ORDER — BISACODYL 10 MG RE SUPP
10.0000 mg | Freq: Every day | RECTAL | Status: DC
  Administered 2017-09-27: 11:00:00 10 mg via RECTAL
  Filled 2017-09-26: qty 1

## 2017-09-26 MED ORDER — BUPIVACAINE LIPOSOME 1.3 % IJ SUSP
INTRAMUSCULAR | Status: DC | PRN
  Administered 2017-09-26: 10 mL

## 2017-09-26 MED ORDER — HYDROCORTISONE 1 % EX CREA
1.0000 "application " | TOPICAL_CREAM | Freq: Three times a day (TID) | CUTANEOUS | Status: DC | PRN
  Filled 2017-09-26: qty 28

## 2017-09-26 MED ORDER — INSULIN ASPART 100 UNIT/ML ~~LOC~~ SOLN: 0.0000 [IU] | SUBCUTANEOUS | Status: DC

## 2017-09-26 MED ORDER — COLCHICINE 0.6 MG PO TABS
0.6000 mg | ORAL_TABLET | Freq: Every day | ORAL | Status: DC
  Administered 2017-09-26 – 2017-09-27 (×2): 0.6 mg via ORAL
  Filled 2017-09-26 (×2): qty 1

## 2017-09-26 MED ORDER — FENTANYL CITRATE (PF) 250 MCG/5ML IJ SOLN
INTRAMUSCULAR | Status: AC
  Filled 2017-09-26: qty 5

## 2017-09-26 MED ORDER — FENTANYL CITRATE (PF) 100 MCG/2ML IJ SOLN
INTRAMUSCULAR | Status: DC | PRN
  Administered 2017-09-26 (×2): 50 ug via INTRAVENOUS
  Administered 2017-09-26: 100 ug via INTRAVENOUS

## 2017-09-26 MED ORDER — SODIUM CHLORIDE 0.9 % IV SOLN
8.0000 mg | Freq: Four times a day (QID) | INTRAVENOUS | Status: DC | PRN
  Filled 2017-09-26: qty 4

## 2017-09-26 SURGICAL SUPPLY — 38 items
APPLIER CLIP 5 13 M/L LIGAMAX5 (MISCELLANEOUS)
BINDER ABDOMINAL 12 ML 46-62 (SOFTGOODS) IMPLANT
CABLE HIGH FREQUENCY MONO STRZ (ELECTRODE) ×3 IMPLANT
CHLORAPREP W/TINT 26ML (MISCELLANEOUS) ×3 IMPLANT
CLIP APPLIE 5 13 M/L LIGAMAX5 (MISCELLANEOUS) IMPLANT
CLOSURE WOUND 1/2 X4 (GAUZE/BANDAGES/DRESSINGS) ×2
COVER SURGICAL LIGHT HANDLE (MISCELLANEOUS) ×3 IMPLANT
DECANTER SPIKE VIAL GLASS SM (MISCELLANEOUS) ×3 IMPLANT
DEVICE SECURE STRAP 25 ABSORB (INSTRUMENTS) IMPLANT
DEVICE TROCAR PUNCTURE CLOSURE (ENDOMECHANICALS) ×3 IMPLANT
DRAPE WARM FLUID 44X44 (DRAPE) ×3 IMPLANT
DRSG TEGADERM 2-3/8X2-3/4 SM (GAUZE/BANDAGES/DRESSINGS) ×9 IMPLANT
DRSG TEGADERM 4X4.75 (GAUZE/BANDAGES/DRESSINGS) IMPLANT
ELECT REM PT RETURN 15FT ADLT (MISCELLANEOUS) ×3 IMPLANT
GAUZE SPONGE 2X2 8PLY STRL LF (GAUZE/BANDAGES/DRESSINGS) ×2 IMPLANT
GLOVE ECLIPSE 8.0 STRL XLNG CF (GLOVE) ×3 IMPLANT
GLOVE INDICATOR 8.0 STRL GRN (GLOVE) ×3 IMPLANT
GOWN STRL REUS W/TWL XL LVL3 (GOWN DISPOSABLE) ×6 IMPLANT
IRRIG SUCT STRYKERFLOW 2 WTIP (MISCELLANEOUS) ×6
IRRIGATION SUCT STRKRFLW 2 WTP (MISCELLANEOUS) ×2 IMPLANT
KIT BASIN OR (CUSTOM PROCEDURE TRAY) ×3 IMPLANT
MARKER SKIN DUAL TIP RULER LAB (MISCELLANEOUS) ×3 IMPLANT
NEEDLE SPNL 22GX3.5 QUINCKE BK (NEEDLE) IMPLANT
PAD POSITIONING PINK XL (MISCELLANEOUS) ×3 IMPLANT
SCISSORS LAP 5X35 DISP (ENDOMECHANICALS) ×3 IMPLANT
SHEARS HARMONIC ACE PLUS 36CM (ENDOMECHANICALS) IMPLANT
SLEEVE ADV FIXATION 5X100MM (TROCAR) ×3 IMPLANT
SPONGE GAUZE 2X2 STER 10/PKG (GAUZE/BANDAGES/DRESSINGS) ×4
STRIP CLOSURE SKIN 1/2X4 (GAUZE/BANDAGES/DRESSINGS) ×4 IMPLANT
SUT MNCRL AB 4-0 PS2 18 (SUTURE) ×3 IMPLANT
SUT PDS AB 1 CT1 27 (SUTURE) ×6 IMPLANT
SUT PROLENE 1 CT 1 30 (SUTURE) ×18 IMPLANT
TOWEL OR 17X26 10 PK STRL BLUE (TOWEL DISPOSABLE) ×3 IMPLANT
TRAY LAPAROSCOPIC (CUSTOM PROCEDURE TRAY) ×3 IMPLANT
TROCAR ADV FIXATION 11X100MM (TROCAR) IMPLANT
TROCAR ADV FIXATION 5X100MM (TROCAR) ×3 IMPLANT
TROCAR BLADELESS OPT 5 100 (ENDOMECHANICALS) ×3 IMPLANT
TUBING INSUF HEATED (TUBING) ×3 IMPLANT

## 2017-09-26 NOTE — ED Notes (Signed)
Talked with Dr Hal Hope about writing orders states he will put them in and gave me diet order for NPO at this time.

## 2017-09-26 NOTE — H&P (Signed)
History and Physical    Lauren Barber WCH:[id number] DOB: [birth year] DOA: 09/25/2017 Home PCP: Gildardo Cranker, DO  Patient coming from: Home  Chief Complaint: Abd pain, constipation  HPI: Lauren Barber is a 61 y.o. female with medical history significant of obesity, DM2, HTN who presented to the ED with acute onset abd pain that started on the day prior to hospital admission to CDU with abdominal distention. Patient has denied nausea or vomiting on presentation. Prior to this episode, patient reports history of constipation. Reported bowel movement was one day prior to hospital admission with reported small amounts of stool only. She currently still feels "full." Otherwise, patient denies fevers or chills  ED Course: In emergency department, patient was noted have a white blood count of 16.6 thousand. CT scan of the abdomen pelvis had findings consistent with constipation with a broad based. Umbilical hernia containing bowel without proximal obstruction. General surgery was consulted. Hospitalist service was consulted for consideration for admission.  Review of Systems:  Review of Systems  Constitutional: Negative for chills, fever and weight loss.  HENT: Negative for congestion, ear discharge, ear pain and tinnitus.   Eyes: Negative for double vision, photophobia and pain.  Respiratory: Negative for hemoptysis, sputum production and shortness of breath.   Cardiovascular: Negative for palpitations, orthopnea and claudication.  Gastrointestinal: Positive for abdominal pain, constipation and nausea. Negative for diarrhea.  Genitourinary: Negative for frequency, hematuria and urgency.  Musculoskeletal: Negative for back pain, falls and joint pain.  Neurological: Negative for tingling, tremors, sensory change, loss of consciousness and weakness.  Psychiatric/Behavioral: Negative for hallucinations, memory loss and substance abuse.    Past Medical History:  Diagnosis Date  . Anemia   .  Anxiety   . Atopic conjunctivitis   . Cardiomegaly   . Cellulitis    legs  . Cellulitis of right thigh 08/13/2016  . CVA (cerebral vascular accident) ([place])   . Degenerative, intervertebral disc    back, legs  . Depressive disorder    depressive disorder  . Diabetes mellitus without complication ([place])   . Endometriosis   . Essential hypertension 04/12/2007   Qualifier: Diagnosis of  By: Jobe Igo MD, Shanon Brow    . Gout    feet  . Headache(784.0)    otc meds prn  . History of blood transfusion [place] - unsure number of units transfused  . Hypertension   . Insomnia   . Neuromuscular disorder ([place])    diabetic neuropathy - feet  . Obesities, morbid ([place])   . Stroke [facility]) 2013  . Vaginal bleeding     Past Surgical History:  Procedure Laterality Date  . BIOPSY ENDOMETRIAL N/A 10 03 2013  . [place]   x 2  . DILATION AND CURETTAGE OF UTERUS  2013  . TONSILLECTOMY    . WISDOM TOOTH EXTRACTION       reports that she quit smoking about 5 years ago. Her smoking use included cigarettes. She has a 43.00 pack-year smoking history. she has never used smokeless tobacco. She reports that she does not drink alcohol or use drugs.  Allergies  Allergen Reactions  . Nsaids     CKD Cr 2  . Shellfish Allergy     Family History  Problem Relation Age of Onset  . Hypertension Mother   . Hypertension Father     Prior to Admission medications   Medication Sig Start Date End Date Taking? Authorizing Provider  acetaminophen (TYLENOL) 325 MG tablet Take  650 mg by mouth every 6 (six) hours as needed for moderate pain, fever or headache.     [provider]  allopurinol (ZYLOPRIM) 100 MG tablet Take 200 mg by mouth daily.    [provider]  amitriptyline (ELAVIL) 100 MG tablet Take 100 mg by mouth at bedtime.    [provider]  colchicine 0.6 MG tablet Take 0.6 mg by mouth daily.    [provider]  diphenhydrAMINE (BENADRYL) 25  MG tablet Take 25 mg by mouth every 6 (six) hours as needed.    [provider]  docusate sodium (COLACE) 100 MG capsule Take 1 capsule (100 mg total) by mouth 2 (two) times daily. 12/19/14   Rai, Ripudeep K, MD  EPINEPHrine (EPIPEN) 0.3 mg/0.3 mL DEVI Inject 0.3 mg into the muscle once.    [provider]  fenofibrate (TRICOR) 48 MG tablet Take 48 mg by mouth at bedtime.    [provider]  FLUoxetine (PROZAC) 40 MG capsule Take 40 mg by mouth daily.    [provider]  gabapentin (NEURONTIN) 300 MG capsule Take 300 mg by mouth 3 (three) times daily.     [provider]  hydrocortisone cream 1 % Apply 1 application topically 2 (two) times daily.    [provider]  levothyroxine (SYNTHROID, LEVOTHROID) 50 MCG tablet Take 1 tablet (50 mcg total) by mouth daily. 11/18/15   Gerlene Fee, NP  linagliptin (TRADJENTA) 5 MG TABS tablet Take 5 mg by mouth daily.    [provider]  Melatonin 3 MG TABS Take 3 mg by mouth at bedtime.    [provider]  methocarbamol (ROBAXIN) 500 MG tablet Take 500 mg by mouth every 8 (eight) hours as needed for muscle spasms. Reported on 04/17/2016    [provider]  OxyCODONE HCl, Abuse Deter, (OXAYDO) 5 MG TABA Take 1 tablet by mouth every 6 (six) hours as needed.    [provider]  pantoprazole (PROTONIX) 40 MG tablet Take 1 tablet (40 mg total) by mouth daily. 12/19/14   Rai, Vernelle Emerald, MD  polyethylene glycol (MIRALAX / GLYCOLAX) packet Take 17 g by mouth daily.    [provider]  polyvinyl alcohol (LIQUIFILM TEARS) 1.4 % ophthalmic solution Place 1 drop into both eyes every 4 (four) hours as needed for dry eyes.    [provider]  sodium chloride (OCEAN) 0.65 % SOLN nasal spray Place 1 spray into both nostrils as needed for congestion.    [provider]  spironolactone (ALDACTONE) 25 MG tablet Take 25 mg by mouth daily.    [provider]  tiZANidine (ZANAFLEX) 4 MG tablet Take 4 mg by mouth every 8 (eight) hours as needed for muscle spasms.    [provider]  torsemide (DEMADEX) 20 MG tablet Take 40 mg by mouth daily.     [provider]  TRAMADOL HCL PO Take 100 mg by mouth every 6 (six) hours.    [provider]  Vitamin D, Ergocalciferol, (DRISDOL) 50000 units CAPS capsule Take 50,000 Units by mouth every 7 (seven) days. Every Wednesday    [provider]    Physical Exam: Vitals:   09/25/17 2323 09/26/17 0252 09/26/17 0545 09/26/17 0620  BP:  127/74 117/65 124/73  Pulse:  89 95 96  Resp:  18 20 18   Temp:      TempSrc:      SpO2:  100% 98% 98%  Weight: Marland Kitchen)  150.6 kg (332 lb)     Height: 5\' 9"  (1.753 m)       Constitutional: NAD, calm, comfortable Vitals:   09/25/17 2323 09/26/17 0252 09/26/17 0545 09/26/17 0620  BP:  127/74 117/65 124/73  Pulse:  89 95 96  Resp:  18 20 18   Temp:      TempSrc:      SpO2:  100% 98% 98%  Weight: (!) 150.6 kg (332 lb)     Height: 5\' 9"  (1.753 m)      Eyes: PERRL, lids and conjunctivae normal ENMT: Mucous membranes are moist. Posterior pharynx clear of any exudate or lesions.Normal dentition.  Neck: normal, supple, no masses, no thyromegaly Respiratory: clear to auscultation bilaterally, no wheezing, no crackles. Normal respiratory effort. No accessory muscle use.  Cardiovascular: Regular rate and rhythm, no murmurs / rubs / gallops. No extremity edema. 2+ pedal pulses. No carotid bruits.  Abdomen: Decreased bowel sounds, obese, distended  Musculoskeletal: no clubbing / cyanosis. No joint deformity upper and lower extremities. Good ROM, no contractures. Normal muscle tone.  Skin: no rashes, lesions, ulcers. No induration Neurologic: CN 2-12 grossly intact. Sensation intact, DTR normal. Strength 5/5 in all 4.  Psychiatric: Normal judgment and insight. Alert and oriented x 3. Normal mood.    Labs on Admission: I have personally reviewed  following labs and imaging studies  CBC: Recent Labs  Lab 09/26/17 0225  WBC 16.6*  NEUTROABS 13.7*  HGB 10.4*  HCT 32.2*  MCV 97.9  PLT 097   Basic Metabolic Panel: Recent Labs  Lab 09/26/17 0225  NA 136  K 4.5  CL 100*  CO2 25  GLUCOSE 161*  BUN 39*  CREATININE 2.11*  CALCIUM 9.3   GFR: Estimated Creatinine Clearance: 45.3 mL/min (A) (by C-G formula based on SCr of 2.11 mg/dL (H)). Liver Function Tests: Recent Labs  Lab 09/26/17 0225  AST 36  ALT 31  ALKPHOS 67  BILITOT 0.5  PROT 8.1  ALBUMIN 3.7   Recent Labs  Lab 09/26/17 0225  LIPASE 27   No results for input(s): AMMONIA in the last 168 hours. Coagulation Profile: No results for input(s): INR, PROTIME in the last 168 hours. Cardiac Enzymes: No results for input(s): CKTOTAL, CKMB, CKMBINDEX, TROPONINI in the last 168 hours. BNP (last 3 results) No results for input(s): PROBNP in the last 8760 hours. HbA1C: No results for input(s): HGBA1C in the last 72 hours. CBG: No results for input(s): GLUCAP in the last 168 hours. Lipid Profile: No results for input(s): CHOL, HDL, LDLCALC, TRIG, CHOLHDL, LDLDIRECT in the last 72 hours. Thyroid Function Tests: No results for input(s): TSH, T4TOTAL, FREET4, T3FREE, THYROIDAB in the last 72 hours. Anemia Panel: No results for input(s): VITAMINB12, FOLATE, FERRITIN, TIBC, IRON, RETICCTPCT in the last 72 hours. Urine analysis:    Component Value Date/Time   COLORURINE YELLOW 09/26/2017 0212   APPEARANCEUR HAZY (A) 09/26/2017 0212   LABSPEC 1.011 09/26/2017 0212   PHURINE 5.0 09/26/2017 0212   GLUCOSEU NEGATIVE 09/26/2017 0212   HGBUR NEGATIVE 09/26/2017 0212   BILIRUBINUR NEGATIVE 09/26/2017 0212   KETONESUR NEGATIVE 09/26/2017 0212   PROTEINUR NEGATIVE 09/26/2017 0212   UROBILINOGEN 0.2 12/16/2014 1449   NITRITE NEGATIVE 09/26/2017 0212   LEUKOCYTESUR MODERATE (A) 09/26/2017 0212   Sepsis Labs:  !!!!!!!!!!!!!!!!!!!!!!!!!!!!!!!!!!!!!!!!!!!! @LABRCNTIP (procalcitonin:4,lacticidven:4) )No results found for this or any previous visit (from the past 240 hour(s)).   Radiological Exams on Admission:  CT scan personally reviewed Ct Renal Stone Study  Result Date:  09/26/2017 CLINICAL DATA:  Sudden onset abdominal pain and distention with pain radiating to the low back. History of renal failure, hypertension, endometriosis. EXAM: CT ABDOMEN AND PELVIS WITHOUT CONTRAST TECHNIQUE: Multidetector CT imaging of the abdomen and pelvis was performed following the standard protocol without IV contrast. COMPARISON:  05/09/2017 FINDINGS: Lower chest: Atelectasis in the lung bases. Hepatobiliary: Diffuse fatty infiltration of the liver. Gallbladder is surgically absent or contracted. No bile duct dilatation. Pancreas: Unremarkable. No pancreatic ductal dilatation or surrounding inflammatory changes. Spleen: Normal in size without focal abnormality. Adrenals/Urinary Tract: Adrenal glands are unremarkable. Kidneys are normal, without renal calculi, focal lesion, or hydronephrosis. Bladder is unremarkable. Stomach/Bowel: Stomach and small bowel are not abnormally distended. Diffusely stool-filled colon with prominent stool in the rectosigmoid region resulting in mild colonic dilatation. 4.9 cm diameter rounded filling defect in the low sigmoid colon is again demonstrated. This was present to represent a stool ball on the previous study but persistence of this lesion may indicate a large polypoid lesion. Consider colonoscopy for further evaluation. Appendix is not identified. Vascular/Lymphatic: Aortic atherosclerosis. No enlarged abdominal or pelvic lymph nodes. Reproductive: Uterus and bilateral adnexa are unremarkable. Other: Laxity of the anterior abdominal wall with probable broad-based periumbilical hernia containing bowel. No proximal obstruction. No free air or free fluid in the abdomen. Musculoskeletal:  Degenerative changes in the spine. No destructive bone lesions. IMPRESSION: 1. Diffuse fatty infiltration of the liver. 2. 4.9 cm diameter rounded filling defect in the low sigmoid colon may represent a large polypoid lesion. Consider colonoscopy for further evaluation. 3. Prominent stool-filled rectosigmoid colon resulting in mild colonic dilatation, likely representing constipation. 4. Aortic atherosclerosis. 5. Broad-based periumbilical hernia containing bowel without proximal obstruction. 6. No renal or ureteral stone or obstruction. Electronically Signed   By: Lucienne Capers M.D.   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 Abdominal hernia 1. General surgery was consulted, appreciate input 2. Hernia could not be reduced during exam. Given concurrent leukocytosis, there is concern of possible incarcerated hernia 3. At present, patient appears comfortable, stable 4. Will keep patient nothing by mouth 2. Constipation 1. Increased stool burden noted on imaging 2. Patient without significant stool output in at least several days 3. No improvement with scheduled MiraLAX prior to admission 4. Will give trial of soapsuds enema 5. Continue with cathartics as needed 3. Diabetes type 2 1.   2. Will continue patient on sliding-scale insulin 4. Sigmoid mass 1. Noted incidentally on CT abdomen pelvis 2. Recommend outpatient GI follow-up for further evaluation 5. Obesity 1. Appear stable at this time 6. Leukocytosis 1. Presenting white blood count of 16.6 thousand. 2. Patient is nontoxic appearing on exam 3. Afebrile 4. Agent was given 1 dose of Rocephin at time of  admission 5. Will continue to monitor off of antimicrobials for now. If symptoms worsen or if patient becomes febrile, then consider continuing empiric antibiotics at that time  DVT prophylaxis: SCDs  Code Status: Full code Family Communication: Patient in room, family at bedside  Disposition Plan: Uncertain at this time  Consults called: Gen. surgery Admission status: Observation, likely require less than 2 minutes day to  resolve above symptoms  Lauren Barber, Orpah Melter MD [facility] Pager [phone]  If 7PM-7AM, please contact night-coverage [email or web address] Password [email or web address]  09/26/2017, 10:52 AM

## 2017-09-26 NOTE — Transfer of Care (Signed)
Immediate Anesthesia Transfer of Care Note  Patient: Lauren Barber  Procedure(s) Performed: DIAGNOSTIC LAPAROSCOPY (N/A Abdomen)  Patient Location: PACU  Anesthesia Type:General  Level of Consciousness: awake and alert   Airway & Oxygen Therapy: Patient Spontanous Breathing and Patient connected to face mask oxygen  Post-op Assessment: Report given to RN and Post -op Vital signs reviewed and stable  Post vital signs: Reviewed and stable  Last Vitals:  Vitals:   09/26/17 1445 09/26/17 1446  BP:    Pulse: 92 94  Resp: 14 12  Temp:    SpO2: 100% 100%    Last Pain:  Vitals:   09/26/17 1127  TempSrc: Oral  PainSc:          Complications: No apparent anesthesia complications

## 2017-09-26 NOTE — Plan of Care (Signed)
  Education: Knowledge of General Education information will improve 09/26/2017 2349 - Completed/Met by Lilli Few, RN

## 2017-09-26 NOTE — Progress Notes (Signed)
Plan for d/c to SNF, discharge planning per CSW. [phone] 

## 2017-09-26 NOTE — ED Provider Notes (Signed)
[facility] DEPT Provider Note   CSN: [id number] Arrival date & time: 09/25/17  2259     History   Chief Complaint Chief Complaint  Patient presents with  . Abdominal Pain    HPI Lauren Barber is a 61 y.o. female.   Abdominal Pain   This is a new problem. The current episode started yesterday. The problem occurs constantly. The problem has not changed since onset.The pain is associated with an unknown factor. The pain is located in the generalized abdominal region. The pain is severe. Pertinent negatives include fever, diarrhea, flatus, vomiting, dysuria and headaches. Nothing aggravates the symptoms. Nothing relieves the symptoms. Past workup includes CT scan. Her past medical history does not include irritable bowel syndrome.    Past Medical History:  Diagnosis Date  . Anemia   . Anxiety   . Atopic conjunctivitis   . Cardiomegaly   . Cellulitis    legs  . CVA (cerebral vascular accident) ([place])   . Degenerative, intervertebral disc    back, legs  . Depressive disorder    depressive disorder  . Diabetes mellitus without complication ([place])   . Endometriosis   . Essential hypertension 04/12/2007   Qualifier: Diagnosis of  By: Jobe Igo MD, Shanon Brow    . Gout    feet  . Headache(784.0)    otc meds prn  . History of blood transfusion [place] - unsure number of units transfused  . Hypertension   . Insomnia   . Neuromuscular disorder ([place])    diabetic neuropathy - feet  . Obesities, morbid ([place])   . Stroke [facility]) 2013  . Vaginal bleeding     Patient Active Problem List   Diagnosis Date Noted  . Ingrown nail of great toe of right foot 10/24/2016  . Cellulitis of right thigh 08/13/2016  . Bilateral lower extremity edema 06/11/2016  . 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 Dyslipidemia associated with type 2 diabetes mellitus ([place]) 05/05/2016  . Hypothyroidism 06/06/2015  . Chronic  kidney disease (CKD), stage III (moderate) ([place]) 12/24/2014  . Insomnia 09/09/2014  . Slow transit constipation 09/09/2014  . Peripheral autonomic neuropathy due to DM ([place]) 12/02/2013  . Gout 07/03/2013  . Anemia 02/18/2013  . Chronic pain 02/18/2013  . Depression 02/18/2013  . OBESITY, MORBID 04/12/2007  . Essential hypertension 04/12/2007  . DEGENERATION, DISC NOS 04/12/2007    Past Surgical History:  Procedure Laterality Date  . BIOPSY ENDOMETRIAL N/A 10 03 2013  . [place]   x 2  . DILATION AND CURETTAGE OF UTERUS  2013  . TONSILLECTOMY    . WISDOM TOOTH EXTRACTION      OB History    Gravida Para Term Preterm AB Living   2 2 2  0 0 2   SAB TAB Ectopic Multiple Live Births   0 0 0 0         Home Medications    Prior to Admission medications   Medication Sig Start Date End Date Taking? Authorizing Provider  acetaminophen (TYLENOL) 325 MG tablet Take 650 mg by mouth every 6 (six) hours as needed for moderate pain, fever or headache.     [provider]  allopurinol (ZYLOPRIM) 100 MG tablet Take 200 mg by mouth daily.    [provider]  amitriptyline (ELAVIL) 100 MG tablet Take 100 mg by mouth at bedtime.    [provider]  colchicine 0.6 MG tablet  Take 0.6 mg by mouth daily.    [provider]  diphenhydrAMINE (BENADRYL) 25 MG tablet Take 25 mg by mouth every 6 (six) hours as needed.    [provider]  docusate sodium (COLACE) 100 MG capsule Take 1 capsule (100 mg total) by mouth 2 (two) times daily. 12/19/14   Rai, Ripudeep K, MD  EPINEPHrine (EPIPEN) 0.3 mg/0.3 mL DEVI Inject 0.3 mg into the muscle once.    [provider]  fenofibrate (TRICOR) 48 MG tablet Take 48 mg by mouth at bedtime.    [provider]  FLUoxetine (PROZAC) 40 MG capsule Take 40 mg by mouth daily.    [provider]  gabapentin (NEURONTIN) 300 MG capsule Take 300 mg by mouth 3 (three) times daily.      [provider]  hydrocortisone cream 1 % Apply 1 application topically 2 (two) times daily.    [provider]  levothyroxine (SYNTHROID, LEVOTHROID) 50 MCG tablet Take 1 tablet (50 mcg total) by mouth daily. 11/18/15   Gerlene Fee, NP  linagliptin (TRADJENTA) 5 MG TABS tablet Take 5 mg by mouth daily.    [provider]  Melatonin 3 MG TABS Take 3 mg by mouth at bedtime.    [provider]  methocarbamol (ROBAXIN) 500 MG tablet Take 500 mg by mouth every 8 (eight) hours as needed for muscle spasms. Reported on 04/17/2016    [provider]  OxyCODONE HCl, Abuse Deter, (OXAYDO) 5 MG TABA Take 1 tablet by mouth every 6 (six) hours as needed.    [provider]  pantoprazole (PROTONIX) 40 MG tablet Take 1 tablet (40 mg total) by mouth daily. 12/19/14   Rai, Vernelle Emerald, MD  polyethylene glycol (MIRALAX / GLYCOLAX) packet Take 17 g by mouth daily.    [provider]  polyvinyl alcohol (LIQUIFILM TEARS) 1.4 % ophthalmic solution Place 1 drop into both eyes every 4 (four) hours as needed for dry eyes.    [provider]  sodium chloride (OCEAN) 0.65 % SOLN nasal spray Place 1 spray into both nostrils as needed for congestion.    [provider]  spironolactone (ALDACTONE) 25 MG tablet Take 25 mg by mouth daily.    [provider]  tiZANidine (ZANAFLEX) 4 MG tablet Take 4 mg by mouth every 8 (eight) hours as needed for muscle spasms.    [provider]  torsemide (DEMADEX) 20 MG tablet Take 40 mg by mouth daily.     [provider]  TRAMADOL HCL PO Take 100 mg by mouth every 6 (six) hours.    [provider]  Vitamin D, Ergocalciferol, (DRISDOL) 50000 units CAPS capsule Take 50,000 Units by mouth every 7 (seven) days. Every Wednesday    [provider]    Family History Family History  Problem Relation Age of Onset  . Hypertension Mother   . Hypertension Father      Social History Social History   Tobacco Use  . Smoking status: Former Smoker    Packs/day: 1.00    Years: 43.00    Pack years: 43.00    Types: Cigarettes    Last attempt to quit: 06/11/2012    Years since quitting: 5.2  . Smokeless tobacco: Never Used  Substance Use Topics  . Alcohol use: No  . Drug use: No     Allergies   Shellfish allergy   Review of Systems Review of Systems  Constitutional: Negative for fever.  Cardiovascular:  Negative for chest pain.  Gastrointestinal: Positive for abdominal distention and abdominal pain. Negative for diarrhea, flatus and vomiting.  Genitourinary: Negative for dysuria.  Neurological: Negative for headaches.  All other systems reviewed and are negative.    Physical Exam Updated Vital Signs BP 127/74   Pulse 89   Temp 98.4 F (36.9 C) (Oral)   Resp 18   Ht 5\' 9"  (1.753 m)   Wt (!) 150.6 kg (332 lb)   SpO2 100%   BMI 49.03 kg/m   Physical Exam  Constitutional: She appears well-developed and well-nourished. No distress.  HENT:  Head: Normocephalic and atraumatic.  Mouth/Throat: No oropharyngeal exudate.  Eyes: Conjunctivae are normal. Pupils are equal, round, and reactive to light.  Neck: Normal range of motion. Neck supple. No JVD present.  Cardiovascular: Normal rate, regular rhythm, normal heart sounds and intact distal pulses.  Pulmonary/Chest: Effort normal and breath sounds normal. No stridor. She has no wheezes. She has no rales.  Abdominal: Soft. She exhibits distension. She exhibits no mass. Bowel sounds are increased. There is tenderness. There is no rigidity, no rebound, no guarding, no tenderness at McBurney's point and negative Murphy's sign. Hernia confirmed negative in the right inguinal area and confirmed negative in the left inguinal area.  Neurological: She is alert.  Skin: Skin is warm and dry. Capillary refill takes less than 2 seconds.  Psychiatric: She has a normal mood and affect.     ED  Treatments / Results  Labs (all labs ordered are listed, but only abnormal results are displayed)  Results for orders placed or performed during the hospital encounter of 09/25/17  CBC with Differential/Platelet  Result Value Ref Range   WBC 16.6 (H) 4.0 - 10.5 K/uL   RBC 3.29 (L) 3.87 - 5.11 MIL/uL   Hemoglobin 10.4 (L) 12.0 - 15.0 g/dL   HCT 32.2 (L) 36.0 - 46.0 %   MCV 97.9 78.0 - 100.0 fL   MCH 31.6 26.0 - 34.0 pg   MCHC 32.3 30.0 - 36.0 g/dL   RDW 16.0 (H) 11.5 - 15.5 %   Platelets 313 150 - 400 K/uL   Neutrophils Relative % 83 %   Neutro Abs 13.7 (H) 1.7 - 7.7 K/uL   Lymphocytes Relative 14 %   Lymphs Abs 2.4 0.7 - 4.0 K/uL   Monocytes Relative 3 %   Monocytes Absolute 0.5 0.1 - 1.0 K/uL   Eosinophils Relative 0 %   Eosinophils Absolute 0.1 0.0 - 0.7 K/uL   Basophils Relative 0 %   Basophils Absolute 0.0 0.0 - 0.1 K/uL  Comprehensive metabolic panel  Result Value Ref Range   Sodium 136 135 - 145 mmol/L   Potassium 4.5 3.5 - 5.1 mmol/L   Chloride 100 (L) 101 - 111 mmol/L   CO2 25 22 - 32 mmol/L   Glucose, Bld 161 (H) 65 - 99 mg/dL   BUN 39 (H) 6 - 20 mg/dL   Creatinine, Ser 2.11 (H) 0.44 - 1.00 mg/dL   Calcium 9.3 8.9 - 10.3 mg/dL   Total Protein 8.1 6.5 - 8.1 g/dL   Albumin 3.7 3.5 - 5.0 g/dL   AST 36 15 - 41 U/L   ALT 31 14 - 54 U/L   Alkaline Phosphatase 67 38 - 126 U/L   Total Bilirubin 0.5 0.3 - 1.2 mg/dL   GFR calc non Af Amer 25 (L) >60 mL/min   GFR calc Af Amer 28 (L) >60 mL/min   Anion gap 11 5 -  15  Urinalysis, Routine w reflex microscopic  Result Value Ref Range   Color, Urine YELLOW YELLOW   APPearance HAZY (A) CLEAR   Specific Gravity, Urine 1.011 1.005 - 1.030   pH 5.0 5.0 - 8.0   Glucose, UA NEGATIVE NEGATIVE mg/dL   Hgb urine dipstick NEGATIVE NEGATIVE   Bilirubin Urine NEGATIVE NEGATIVE   Ketones, ur NEGATIVE NEGATIVE mg/dL   Protein, ur NEGATIVE NEGATIVE mg/dL   Nitrite NEGATIVE NEGATIVE   Leukocytes, UA MODERATE (A) NEGATIVE   RBC /  HPF 0-5 0 - 5 RBC/hpf   WBC, UA TOO NUMEROUS TO COUNT 0 - 5 WBC/hpf   Bacteria, UA MANY (A) NONE SEEN   Squamous Epithelial / LPF 0-5 (A) NONE SEEN   Mucus PRESENT    Hyaline Casts, UA PRESENT   Lipase, blood  Result Value Ref Range   Lipase 27 11 - 51 U/L   Ct Renal Stone Study  Result Date: 09/26/2017 CLINICAL DATA:  Sudden onset abdominal pain and distention with pain radiating to the low back. History of renal failure, hypertension, endometriosis. EXAM: CT ABDOMEN AND PELVIS WITHOUT CONTRAST TECHNIQUE: Multidetector CT imaging of the abdomen and pelvis was performed following the standard protocol without IV contrast. COMPARISON:  05/09/2017 FINDINGS: Lower chest: Atelectasis in the lung bases. Hepatobiliary: Diffuse fatty infiltration of the liver. Gallbladder is surgically absent or contracted. No bile duct dilatation. Pancreas: Unremarkable. No pancreatic ductal dilatation or surrounding inflammatory changes. Spleen: Normal in size without focal abnormality. Adrenals/Urinary Tract: Adrenal glands are unremarkable. Kidneys are normal, without renal calculi, focal lesion, or hydronephrosis. Bladder is unremarkable. Stomach/Bowel: Stomach and small bowel are not abnormally distended. Diffusely stool-filled colon with prominent stool in the rectosigmoid region resulting in mild colonic dilatation. 4.9 cm diameter rounded filling defect in the low sigmoid colon is again demonstrated. This was present to represent a stool ball on the previous study but persistence of this lesion may indicate a large polypoid lesion. Consider colonoscopy for further evaluation. Appendix is not identified. Vascular/Lymphatic: Aortic atherosclerosis. No enlarged abdominal or pelvic lymph nodes. Reproductive: Uterus and bilateral adnexa are unremarkable. Other: Laxity of the anterior abdominal wall with probable broad-based periumbilical hernia containing bowel. No proximal obstruction. No free air or free fluid in the  abdomen. Musculoskeletal: Degenerative changes in the spine. No destructive bone lesions. IMPRESSION: 1. Diffuse fatty infiltration of the liver. 2. 4.9 cm diameter rounded filling defect in the low sigmoid colon may represent a large polypoid lesion. Consider colonoscopy for further evaluation. 3. Prominent stool-filled rectosigmoid colon resulting in mild colonic dilatation, likely representing constipation. 4. Aortic atherosclerosis. 5. Broad-based periumbilical hernia containing bowel without proximal obstruction. 6. No renal or ureteral stone or obstruction. Electronically Signed   By: Lucienne Capers M.D.   On: 09/26/2017 01:15    Radiology Ct Renal Stone Study  Result Date: 09/26/2017 CLINICAL DATA:  Sudden onset abdominal pain and distention with pain radiating to the low back. History of renal failure, hypertension, endometriosis. EXAM: CT ABDOMEN AND PELVIS WITHOUT CONTRAST TECHNIQUE: Multidetector CT imaging of the abdomen and pelvis was performed following the standard protocol without IV contrast. COMPARISON:  05/09/2017 FINDINGS: Lower chest: Atelectasis in the lung bases. Hepatobiliary: Diffuse fatty infiltration of the liver. Gallbladder is surgically absent or contracted. No bile duct dilatation. Pancreas: Unremarkable. No pancreatic ductal dilatation or surrounding inflammatory changes. Spleen: Normal in size without focal abnormality. Adrenals/Urinary Tract: Adrenal glands are unremarkable. Kidneys are normal, without renal calculi, focal lesion, or hydronephrosis. Bladder is  unremarkable. Stomach/Bowel: Stomach and small bowel are not abnormally distended. Diffusely stool-filled colon with prominent stool in the rectosigmoid region resulting in mild colonic dilatation. 4.9 cm diameter rounded filling defect in the low sigmoid colon is again demonstrated. This was present to represent a stool ball on the previous study but persistence of this lesion may indicate a large polypoid lesion.  Consider colonoscopy for further evaluation. Appendix is not identified. Vascular/Lymphatic: Aortic atherosclerosis. No enlarged abdominal or pelvic lymph nodes. Reproductive: Uterus and bilateral adnexa are unremarkable. Other: Laxity of the anterior abdominal wall with probable broad-based periumbilical hernia containing bowel. No proximal obstruction. No free air or free fluid in the abdomen. Musculoskeletal: Degenerative changes in the spine. No destructive bone lesions. IMPRESSION: 1. Diffuse fatty infiltration of the liver. 2. 4.9 cm diameter rounded filling defect in the low sigmoid colon may represent a large polypoid lesion. Consider colonoscopy for further evaluation. 3. Prominent stool-filled rectosigmoid colon resulting in mild colonic dilatation, likely representing constipation. 4. Aortic atherosclerosis. 5. Broad-based periumbilical hernia containing bowel without proximal obstruction. 6. No renal or ureteral stone or obstruction. Electronically Signed   By: Lucienne Capers M.D.   On: 09/26/2017 01:15    Procedures Procedures (including critical care time)  Medications Ordered in ED Medications  cefTRIAXone (ROCEPHIN) 1 g in dextrose 5 % 50 mL IVPB (1 g Intravenous New Bag/Given 09/26/17 0326)  fentaNYL (SUBLIMAZE) injection 50 mcg (not administered)  gi cocktail (Maalox,Lidocaine,Donnatal) (30 mLs Oral Given 09/26/17 0246)    Attempted reduction without success  440 AM case d/w Dr. Hassell Done of surgery, pain likely secondary to fat necrosis.  Please admit to medicine and surgery will consult.     Final Clinical Impressions(s) / ED Diagnoses   Final diagnoses:  None    ED Discharge Orders    None       Abdikadir Fohl, MD 09/26/17 [phone]

## 2017-09-26 NOTE — Op Note (Signed)
09/26/2017  PATIENT:  Lauren Barber  61 y.o. female  Patient Care Team: Gildardo Cranker, DO as PCP - [facility] ([facility], [facility] ([place])  PRE-OPERATIVE DIAGNOSIS:  Incarcerated ventral incisional hernia  POST-OPERATIVE DIAGNOSIS:    Diastasis recti Massive left-sided colon distention with constipation. No hernia.  PROCEDURE:  DIAGNOSTIC LAPAROSCOPY Laparoscopic lysis of adhesions  SURGEON:  Adin Hector, MD  ASSISTANT: Nurse   ANESTHESIA:     General  Local anesthesia field block: (0.25% bupivacaine & liposomal  Bupivacaine [Experel])  EBL:  Total I/O In: 1000 [I.V.:1000] Out: 125 [Urine:100; Blood:25]  Per anesthesia record  Delay start of Pharmacological VTE agent (>24hrs) due to surgical blood loss or risk of bleeding:  no  DRAINS: none   SPECIMEN:  No Specimen  DISPOSITION OF SPECIMEN:  N/A  COUNTS:  YES  PLAN OF CARE: Admit to inpatient   PATIENT DISPOSITION:  PACU - hemodynamically stable.  INDICATION: Patient with worsening abdominal pain in the lower abdomen near presumed hernia by CAT scan.  Worsening discomfort.  Harder mass felt.  Feeling worse.  Recommendation made for laparoscopic possible open exploration for probable incarcerated hernia reduction and repair.  The anatomy & physiology of the digestive tract was discussed.  The pathophysiology of perforation was discussed.  Differential diagnosis such as perforated ulcer or colon, etc was discussed.   Natural history risks without surgery such as death was discussed.  I recommended abdominal exploration to diagnose & treat the source of the problem.  Laparoscopic & open techniques were discussed.   Risks such as bleeding, infection, abscess, leak, reoperation, bowel resection, possible ostomy, injury to other organs, need for repair of tissues / organs, hernia, heart attack, death, and other risks were discussed.   The risks of no intervention will lead  to serious problems including death.   I expressed a good likelihood that surgery will address the problem.    Goals of post-operative recovery were discussed as well.  We will work to minimize complications although risks in an emergent setting are high.   Questions were answered.  The patient expressed understanding & wishes to proceed with surgery.       OR FINDINGS: Omental adhesions low midline incision but no evidence of hernia.  Significant diastases recti periumbilically but no hernia.  Very dilated rectosigmoid colon and left colon going up to splenic flexure.  Proximal colon decompressed.  Small bowel decompressed.  No mass or tumor.  Mildly enlarged uterus with simple left ovarian cyst.  Right adnexa normal.  No obvious mass palpated along the rectosigmoid colon down to the peritoneal reflection.   DESCRIPTION:   Informed consent was confirmed. The patient underwent general anaesthesia without difficulty. The patient was positioned appropriately. VTE prevention in place. The patient's abdomen was clipped, prepped, & draped in a sterile fashion. Surgical timeout confirmed our plan.  The patient was positioned in reverse Trendelenburg. Abdominal entry was gained using optical entry technique in the left upper abdomen. Entry was clean. I induced carbon dioxide insufflation. Camera inspection revealed no injury. Extra ports were carefully placed under direct laparoscopic visualization.   Encountered rather dilated lower colon.  The rectosigmoid colon primarily, pushing the small bowel supraumbilically.  I freed some omental adhesions off the anterior abdominal wall.  Confirm no hernia.  Paraumbilically it was quite thinned out with a stretch out diastases but no hernia.  There is no incarcerated hernia.  No strangulation.  I ran the small bowel from the  ligament of Treitz to ileocecal valve back.  No Meckel's diverticulum.  No phlegmon on cecum or terminal ileum to suggest appendicitis.  No  abnormality.  The colon was quite dilated but full of soft stool and some gas.  It looked chronically stretched.  No phlegmon.  No perforation.  No stricturing.  Soft all the way down to the peritoneal reflection.  Intraoperative findings as noted above.  Simple cyst on left ovary.  Mildly enlarged uterus no fibroids.  Capnoperitoneum was evacuated. Ports were removed. The skin was closed with Monocryl at the port sites and Steri-Strips on the fascial stitch puncture sites.  Patient is being extubated to go to the recovery room. I made an attempt to locate family to discuss patient's status and recommendations.  No one is available at this time.  I will try again later  Adin Hector, M.D., F.A.C.S. Gastrointestinal and Minimally Invasive Surgery [facility] [address], [place], [place] [address] [phone] Main / Paging  09/26/2017 4:26 PM

## 2017-09-26 NOTE — Consult Note (Signed)
[facility] [address] Surgery Consult/Admission Note  Lauren Barber [birth year]  [id number].    Requesting MD: Dr. Randal Buba Chief Complaint/Reason for Consult: Hernia  HPI:   Patient is a 61 year old female with a history of stroke in 2013 with left-sided and b/l hand weakness, morbid obesity, HTN, gout, diabetes type II, who presented to the emergency department with complaints of abdominal pain. Patient has a dull pain that started two days ago and has progressively worsened. Pain is just below her umbilicus, nonradiating, severe at times. No associated symptoms. Patient denies nausea vomiting, fever or chills, diarrhea. Patient is unaware that she has a hernia. Pain medicine she has been receiving has helped her pain. CT scan showed constipation and a broad-based  periumbilical hernia containing bowel without proximal obstruction. WBC 16.6. Hg 10.4. Patient states she has never had a colonoscopy. We discussed the results of her CT scan and that she needed one due to the incidental finding of a possible mass in her sigmoid colon.  ROS:  Review of Systems  Constitutional: Negative for chills, diaphoresis and fever.  Cardiovascular: Negative for chest pain.  Gastrointestinal: Positive for abdominal pain and constipation. Negative for diarrhea, nausea and vomiting.  Skin: Negative for rash.  Neurological: Negative for dizziness and loss of consciousness.  All other systems reviewed and are negative.    Family History  Problem Relation Age of Onset  . Hypertension Mother   . Hypertension Father     Past Medical History:  Diagnosis Date  . Anemia   . Anxiety   . Atopic conjunctivitis   . Cardiomegaly   . Cellulitis    legs  . Cellulitis of right thigh 08/13/2016  . CVA (cerebral vascular accident) ([place])   . Degenerative, intervertebral disc    back, legs  . Depressive disorder    depressive disorder  . Diabetes mellitus without complication ([place])   . Endometriosis   . Essential  hypertension 04/12/2007   Qualifier: Diagnosis of  By: Jobe Igo MD, Shanon Brow    . Gout    feet  . Headache(784.0)    otc meds prn  . History of blood transfusion [place] - unsure number of units transfused  . Hypertension   . Insomnia   . Neuromuscular disorder ([place])    diabetic neuropathy - feet  . Obesities, morbid ([place])   . Stroke [facility]) 2013  . Vaginal bleeding     Past Surgical History:  Procedure Laterality Date  . BIOPSY ENDOMETRIAL N/A 10 03 2013  . [place]   x 2  . DILATION AND CURETTAGE OF UTERUS  2013  . TONSILLECTOMY    . WISDOM TOOTH EXTRACTION      Social History:  reports that she quit smoking about 5 years ago. Her smoking use included cigarettes. She has a 43.00 pack-year smoking history. she has never used smokeless tobacco. She reports that she does not drink alcohol or use drugs.  Allergies:  Allergies  Allergen Reactions  . Shellfish Allergy     Medications Prior to Admission  Medication Sig Dispense Refill  . acetaminophen (TYLENOL) 325 MG tablet Take 650 mg by mouth every 6 (six) hours as needed for moderate pain, fever or headache.     . allopurinol (ZYLOPRIM) 100 MG tablet Take 200 mg by mouth daily.    Marland Kitchen amitriptyline (ELAVIL) 100 MG tablet Take 100 mg by mouth at bedtime.    . colchicine 0.6 MG tablet Take 0.6 mg by mouth daily.    Marland Kitchen  diphenhydrAMINE (BENADRYL) 25 MG tablet Take 25 mg by mouth every 6 (six) hours as needed.    . docusate sodium (COLACE) 100 MG capsule Take 1 capsule (100 mg total) by mouth 2 (two) times daily. 10 capsule 0  . EPINEPHrine (EPIPEN) 0.3 mg/0.3 mL DEVI Inject 0.3 mg into the muscle once.    . fenofibrate (TRICOR) 48 MG tablet Take 48 mg by mouth at bedtime.    Marland Kitchen FLUoxetine (PROZAC) 40 MG capsule Take 40 mg by mouth daily.    Marland Kitchen gabapentin (NEURONTIN) 300 MG capsule Take 300 mg by mouth 3 (three) times daily.     . hydrocortisone cream 1 % Apply 1 application topically 2 (two) times daily.      Marland Kitchen levothyroxine (SYNTHROID, LEVOTHROID) 50 MCG tablet Take 1 tablet (50 mcg total) by mouth daily. 90 tablet 3  . linagliptin (TRADJENTA) 5 MG TABS tablet Take 5 mg by mouth daily.    . Melatonin 3 MG TABS Take 3 mg by mouth at bedtime.    . methocarbamol (ROBAXIN) 500 MG tablet Take 500 mg by mouth every 8 (eight) hours as needed for muscle spasms. Reported on 04/17/2016    . OxyCODONE HCl, Abuse Deter, (OXAYDO) 5 MG TABA Take 1 tablet by mouth every 6 (six) hours as needed.    . pantoprazole (PROTONIX) 40 MG tablet Take 1 tablet (40 mg total) by mouth daily.    . polyethylene glycol (MIRALAX / GLYCOLAX) packet Take 17 g by mouth daily.    . polyvinyl alcohol (LIQUIFILM TEARS) 1.4 % ophthalmic solution Place 1 drop into both eyes every 4 (four) hours as needed for dry eyes.    . sodium chloride (OCEAN) 0.65 % SOLN nasal spray Place 1 spray into both nostrils as needed for congestion.    Marland Kitchen spironolactone (ALDACTONE) 25 MG tablet Take 25 mg by mouth daily.    Marland Kitchen tiZANidine (ZANAFLEX) 4 MG tablet Take 4 mg by mouth every 8 (eight) hours as needed for muscle spasms.    Marland Kitchen torsemide (DEMADEX) 20 MG tablet Take 40 mg by mouth daily.     . TRAMADOL HCL PO Take 100 mg by mouth every 6 (six) hours.    . Vitamin D, Ergocalciferol, (DRISDOL) 50000 units CAPS capsule Take 50,000 Units by mouth every 7 (seven) days. Every Wednesday      Blood pressure 124/73, pulse 96, temperature 98.4 F (36.9 C), temperature source Oral, resp. rate 18, height _0  (1.753 m), weight (!) 332 lb (150.6 kg), SpO2 98 %.  Physical Exam  Constitutional: She is oriented to person, place, and time and well-developed, well-nourished, and in no distress. Vital signs are normal. No distress.  Morbidly obese AA female  HENT:  Head: Normocephalic and atraumatic.  Nose: Nose normal.  Mouth/Throat: Uvula is midline and oropharynx is clear and moist. Mucous membranes are not pale and dry. No oropharyngeal exudate.  Eyes: Conjunctivae  are normal. Pupils are equal, round, and reactive to light. Right eye exhibits no discharge. Left eye exhibits no discharge. No scleral icterus.  Neck: Normal range of motion. Neck supple. No tracheal deviation present. No thyromegaly present.  Cardiovascular: Normal rate, regular rhythm, normal heart sounds and intact distal pulses. Exam reveals no gallop and no friction rub.  No murmur heard. Pulses:      Radial pulses are 2+ on the right side, and 2+ on the left side.       Dorsalis pedis pulses are 2+ on the right side,  and 2+ on the left side.  Pulmonary/Chest: Effort normal and breath sounds normal. No respiratory distress. She has no decreased breath sounds. She has no wheezes. She has no rhonchi. She has no rales.  Abdominal: Soft. She exhibits no distension and no mass. Bowel sounds are hypoactive. There is tenderness in the right lower quadrant and periumbilical area. There is no rigidity, no rebound and no guarding. A hernia is present.    Well-healed midline vertical scar below the umbilicus. Periumbilical hernia that is firm, and I believe unable to be reduced, difficult to really assess with pt's body habitus, attempt to reduce did cause significant pain, patient also having significant pain in the right lower quadrant.   Musculoskeletal: She exhibits edema (mild BLE).  Limited AROM from stroke affecting both hand and BLE  Neurological: She is alert and oriented to person, place, and time.  Skin: Skin is warm and dry. No rash noted. She is not diaphoretic.  Psychiatric: Mood and affect normal.  Nursing note and vitals reviewed.   Results for orders placed or performed during the hospital encounter of 09/25/17 (from the past 48 hour(s))  Urinalysis, Routine w reflex microscopic     Status: Abnormal   Collection Time: 09/26/17  2:12 AM  Result Value Ref Range   Color, Urine YELLOW YELLOW   APPearance HAZY (A) CLEAR   Specific Gravity, Urine 1.011 1.005 - 1.030   pH 5.0 5.0 -  8.0   Glucose, UA NEGATIVE NEGATIVE mg/dL   Hgb urine dipstick NEGATIVE NEGATIVE   Bilirubin Urine NEGATIVE NEGATIVE   Ketones, ur NEGATIVE NEGATIVE mg/dL   Protein, ur NEGATIVE NEGATIVE mg/dL   Nitrite NEGATIVE NEGATIVE   Leukocytes, UA MODERATE (A) NEGATIVE   RBC / HPF 0-5 0 - 5 RBC/hpf   WBC, UA TOO NUMEROUS TO COUNT 0 - 5 WBC/hpf   Bacteria, UA MANY (A) NONE SEEN   Squamous Epithelial / LPF 0-5 (A) NONE SEEN   Mucus PRESENT    Hyaline Casts, UA PRESENT   CBC with Differential/Platelet     Status: Abnormal   Collection Time: 09/26/17  2:25 AM  Result Value Ref Range   WBC 16.6 (H) 4.0 - 10.5 K/uL   RBC 3.29 (L) 3.87 - 5.11 MIL/uL   Hemoglobin 10.4 (L) 12.0 - 15.0 g/dL   HCT 32.2 (L) 36.0 - 46.0 %   MCV 97.9 78.0 - 100.0 fL   MCH 31.6 26.0 - 34.0 pg   MCHC 32.3 30.0 - 36.0 g/dL   RDW 16.0 (H) 11.5 - 15.5 %   Platelets 313 150 - 400 K/uL   Neutrophils Relative % 83 %   Neutro Abs 13.7 (H) 1.7 - 7.7 K/uL   Lymphocytes Relative 14 %   Lymphs Abs 2.4 0.7 - 4.0 K/uL   Monocytes Relative 3 %   Monocytes Absolute 0.5 0.1 - 1.0 K/uL   Eosinophils Relative 0 %   Eosinophils Absolute 0.1 0.0 - 0.7 K/uL   Basophils Relative 0 %   Basophils Absolute 0.0 0.0 - 0.1 K/uL  Comprehensive metabolic panel     Status: Abnormal   Collection Time: 09/26/17  2:25 AM  Result Value Ref Range   Sodium 136 135 - 145 mmol/L   Potassium 4.5 3.5 - 5.1 mmol/L   Chloride 100 (L) 101 - 111 mmol/L   CO2 25 22 - 32 mmol/L   Glucose, Bld 161 (H) 65 - 99 mg/dL   BUN 39 (H) 6 - 20 mg/dL  Creatinine, Ser 2.11 (H) 0.44 - 1.00 mg/dL   Calcium 9.3 8.9 - 10.3 mg/dL   Total Protein 8.1 6.5 - 8.1 g/dL   Albumin 3.7 3.5 - 5.0 g/dL   AST 36 15 - 41 U/L   ALT 31 14 - 54 U/L   Alkaline Phosphatase 67 38 - 126 U/L   Total Bilirubin 0.5 0.3 - 1.2 mg/dL   GFR calc non Af Amer 25 (L) >60 mL/min   GFR calc Af Amer 28 (L) >60 mL/min    Comment: (NOTE) The eGFR has been calculated using the CKD EPI  equation. This calculation has not been validated in all clinical situations. eGFR's persistently <60 mL/min signify possible Chronic Kidney Disease.    Anion gap 11 5 - 15  Lipase, blood     Status: None   Collection Time: 09/26/17  2:25 AM  Result Value Ref Range   Lipase 27 11 - 51 U/L   Ct Renal Stone Study  Result Date: 09/26/2017 CLINICAL DATA:  Sudden onset abdominal pain and distention with pain radiating to the low back. History of renal failure, hypertension, endometriosis. EXAM: CT ABDOMEN AND PELVIS WITHOUT CONTRAST TECHNIQUE: Multidetector CT imaging of the abdomen and pelvis was performed following the standard protocol without IV contrast. COMPARISON:  05/09/2017 FINDINGS: Lower chest: Atelectasis in the lung bases. Hepatobiliary: Diffuse fatty infiltration of the liver. Gallbladder is surgically absent or contracted. No bile duct dilatation. Pancreas: Unremarkable. No pancreatic ductal dilatation or surrounding inflammatory changes. Spleen: Normal in size without focal abnormality. Adrenals/Urinary Tract: Adrenal glands are unremarkable. Kidneys are normal, without renal calculi, focal lesion, or hydronephrosis. Bladder is unremarkable. Stomach/Bowel: Stomach and small bowel are not abnormally distended. Diffusely stool-filled colon with prominent stool in the rectosigmoid region resulting in mild colonic dilatation. 4.9 cm diameter rounded filling defect in the low sigmoid colon is again demonstrated. This was present to represent a stool ball on the previous study but persistence of this lesion may indicate a large polypoid lesion. Consider colonoscopy for further evaluation. Appendix is not identified. Vascular/Lymphatic: Aortic atherosclerosis. No enlarged abdominal or pelvic lymph nodes. Reproductive: Uterus and bilateral adnexa are unremarkable. Other: Laxity of the anterior abdominal wall with probable broad-based periumbilical hernia containing bowel. No proximal obstruction. No  free air or free fluid in the abdomen. Musculoskeletal: Degenerative changes in the spine. No destructive bone lesions. IMPRESSION: 1. Diffuse fatty infiltration of the liver. 2. 4.9 cm diameter rounded filling defect in the low sigmoid colon may represent a large polypoid lesion. Consider colonoscopy for further evaluation. 3. Prominent stool-filled rectosigmoid colon resulting in mild colonic dilatation, likely representing constipation. 4. Aortic atherosclerosis. 5. Broad-based periumbilical hernia containing bowel without proximal obstruction. 6. No renal or ureteral stone or obstruction. Electronically Signed   By: Lucienne Capers M.D.   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 Thank you  for the consult.   Kalman Drape, [facility] Surgery 09/26/2017, 10:11 AM Pager: [phone] Consults: [phone] Mon-Fri 7:00 am-4:30 pm Sat-Sun 7:00 am-11:30 am

## 2017-09-26 NOTE — Anesthesia Procedure Notes (Signed)
Procedure Name: Intubation Date/Time: 09/26/2017 3:10 PM Performed by: Lavina Hamman, CRNA Pre-anesthesia Checklist: Patient identified, Emergency Drugs available, Suction available, Patient being monitored and Timeout performed Patient Re-evaluated:Patient Re-evaluated prior to induction Oxygen Delivery Method: Circle system utilized Preoxygenation: Pre-oxygenation with 100% oxygen Induction Type: IV induction Ventilation: Mask ventilation without difficulty Laryngoscope Size: Mac and 4 Grade View: Grade II Tube type: Oral Tube size: 7.0 mm Number of attempts: 1 Airway Equipment and Method: Stylet Placement Confirmation: ETT inserted through vocal cords under direct vision,  positive ETCO2,  CO2 detector and breath sounds checked- equal and bilateral Secured at: 22 cm Tube secured with: Tape Dental Injury: Teeth and Oropharynx as per pre-operative assessment

## 2017-09-26 NOTE — Anesthesia Postprocedure Evaluation (Signed)
Anesthesia Post Note  Patient: Shanera Meske  Procedure(s) Performed: DIAGNOSTIC LAPAROSCOPY (N/A Abdomen)     Patient location during evaluation: PACU Anesthesia Type: General Level of consciousness: awake and alert Pain management: pain level controlled Vital Signs Assessment: post-procedure vital signs reviewed and stable Respiratory status: spontaneous breathing, nonlabored ventilation, respiratory function stable and patient connected to nasal cannula oxygen Cardiovascular status: blood pressure returned to baseline and stable Postop Assessment: no apparent nausea or vomiting Anesthetic complications: no    Last Vitals:  Vitals:   09/26/17 1637 09/26/17 1645  BP: 117/65 101/67  Pulse:  (!) 109  Resp: 15 14  Temp: 37 C   SpO2: 100% 97%    Last Pain:  Vitals:   09/26/17 1637  TempSrc:   PainSc: 0-No pain                 Ceara Wrightson DAVID

## 2017-09-26 NOTE — Progress Notes (Signed)
Assisted Dr. Ossey with right, left, ultrasound guided, transabdominal plane block. Side rails up, monitors on throughout procedure. See vital signs in flow sheet. Tolerated Procedure well. 

## 2017-09-26 NOTE — Anesthesia Preprocedure Evaluation (Signed)
Anesthesia Evaluation  Patient identified by MRN, date of birth, ID band Patient awake    Reviewed: Allergy & Precautions, NPO status , Patient's Chart, lab work & pertinent test results  Airway Mallampati: II  TM Distance: >3 FB Neck ROM: Full    Dental   Pulmonary former smoker,    Pulmonary exam normal        Cardiovascular hypertension, Normal cardiovascular exam     Neuro/Psych Anxiety Depression CVA    GI/Hepatic   Endo/Other  diabetes, Type 2, Oral Hypoglycemic Agents  Renal/GU      Musculoskeletal   Abdominal   Peds  Hematology   Anesthesia Other Findings   Reproductive/Obstetrics                             Anesthesia Physical Anesthesia Plan  ASA: III  Anesthesia Plan: General   Post-op Pain Management:  Regional for Post-op pain   Induction: Intravenous  PONV Risk Score and Plan: 3 and Midazolam, Dexamethasone and Ondansetron  Airway Management Planned: Oral ETT  Additional Equipment:   Intra-op Plan:   Post-operative Plan: Extubation in OR  Informed Consent: I have reviewed the patients History and Physical, chart, labs and discussed the procedure including the risks, benefits and alternatives for the proposed anesthesia with the patient or authorized representative who has indicated his/her understanding and acceptance.     Plan Discussed with: CRNA and Surgeon  Anesthesia Plan Comments:         Anesthesia Quick Evaluation

## 2017-09-26 NOTE — Anesthesia Procedure Notes (Addendum)
Anesthesia Regional Block: TAP block   Pre-Anesthetic Checklist: ,, timeout performed, Correct Patient, Correct Site, Correct Laterality, Correct Procedure, Correct Position, site marked, Risks and benefits discussed,  Surgical consent,  Pre-op evaluation,  At surgeon's request and post-op pain management  Laterality: Right and Left  Prep: chloraprep, alcohol swabs       Needles:  Injection technique: Single-shot  Needle Type: Echogenic Stimulator Needle     Needle Length: 9cm  Needle Gauge: 21     Additional Needles:   Narrative:  Start time: 09/29/2017 3:35 PM End time: 09/29/2017 3:50 PM Injection made incrementally with aspirations every 5 mL.  Performed by: Personally  Anesthesiologist: Lillia Abed, MD  Additional Notes: Monitors applied. Patient sedated. Sterile prep and drape,hand hygiene and sterile gloves were used. Bilateral blocks placed. Relevant anatomy identified.Needle positions confirmed.Local anesthetic injected incrementally after negative aspiration. Local anesthetic spread visualized in Transversus Abdominus Plane bilaterally. Vascular puncture avoided. No complications. Images printed for medical record.The patient tolerated the procedure well.

## 2017-09-27 ENCOUNTER — Encounter (HOSPITAL_COMMUNITY): Payer: Self-pay | Admitting: Surgery

## 2017-09-27 DIAGNOSIS — R109 Unspecified abdominal pain: Secondary | ICD-10-CM

## 2017-09-27 DIAGNOSIS — M6208 Separation of muscle (nontraumatic), other site: Secondary | ICD-10-CM | POA: Diagnosis not present

## 2017-09-27 DIAGNOSIS — F329 Major depressive disorder, single episode, unspecified: Secondary | ICD-10-CM | POA: Diagnosis not present

## 2017-09-27 DIAGNOSIS — N183 Chronic kidney disease, stage 3 (moderate): Secondary | ICD-10-CM

## 2017-09-27 DIAGNOSIS — K5939 Other megacolon: Secondary | ICD-10-CM

## 2017-09-27 DIAGNOSIS — K439 Ventral hernia without obstruction or gangrene: Secondary | ICD-10-CM | POA: Diagnosis not present

## 2017-09-27 DIAGNOSIS — G8929 Other chronic pain: Secondary | ICD-10-CM

## 2017-09-27 DIAGNOSIS — N390 Urinary tract infection, site not specified: Secondary | ICD-10-CM | POA: Diagnosis not present

## 2017-09-27 DIAGNOSIS — K5909 Other constipation: Secondary | ICD-10-CM

## 2017-09-27 DIAGNOSIS — E1143 Type 2 diabetes mellitus with diabetic autonomic (poly)neuropathy: Secondary | ICD-10-CM | POA: Diagnosis not present

## 2017-09-27 DIAGNOSIS — K660 Peritoneal adhesions (postprocedural) (postinfection): Secondary | ICD-10-CM | POA: Diagnosis not present

## 2017-09-27 MED ORDER — POLYETHYLENE GLYCOL 3350 17 G PO PACK: 34.0000 g | PACK | Freq: Three times a day (TID) | ORAL | 0 refills | Status: DC

## 2017-09-27 MED ORDER — NYSTATIN 100000 UNIT/GM EX POWD: Freq: Two times a day (BID) | CUTANEOUS | 0 refills | Status: DC

## 2017-09-27 MED ORDER — PSYLLIUM 95 % PO PACK: 1.0000 | PACK | Freq: Two times a day (BID) | ORAL | Status: DC

## 2017-09-27 MED ORDER — TRAMADOL HCL 50 MG PO TABS
100.0000 mg | ORAL_TABLET | Freq: Two times a day (BID) | ORAL | 0 refills | Status: DC | PRN
Start: 2017-09-27 — End: 2017-11-14

## 2017-09-27 MED ORDER — SENNA-DOCUSATE SODIUM 8.6-50 MG PO TABS: 2.0000 | ORAL_TABLET | Freq: Two times a day (BID) | ORAL | Status: DC

## 2017-09-27 MED ORDER — HYDROCODONE-ACETAMINOPHEN 5-325 MG PO TABS: 1.0000 | ORAL_TABLET | Freq: Every day | ORAL | 0 refills | Status: DC | PRN

## 2017-09-27 MED ORDER — BISACODYL 10 MG RE SUPP: 10.0000 mg | Freq: Every day | RECTAL | 0 refills | Status: DC

## 2017-09-27 MED ORDER — SENNOSIDES-DOCUSATE SODIUM 8.6-50 MG PO TABS
2.0000 | ORAL_TABLET | Freq: Two times a day (BID) | ORAL | Status: DC
  Administered 2017-09-27: 10:00:00 2 via ORAL
  Filled 2017-09-27: qty 2

## 2017-09-27 NOTE — Progress Notes (Addendum)
D/C Summary Faxed.  Facility ready for patient at anytime. Room:144 Nurse given number for report.  3:49 [facility] called for transport.   Kathrin Greathouse, Latanya Presser, MSW Clinical Social Worker  [phone] 09/27/2017  2:14 PM

## 2017-09-27 NOTE — Progress Notes (Signed)
Pt ready for discharge to [facility] facility. Report called to RN, discussed discharge with patient. Pt also had significan bm and void prior to discharge. Pt will transport via EMS.

## 2017-09-27 NOTE — NC FL2 (Signed)
[place] LEVEL OF CARE SCREENING TOOL     IDENTIFICATION  Patient Name: Desarae Placide Birthdate: [birth year] Sex: female Admission Date (Current Location): 09/25/2017  [facility] [place] and [place] Number:  Herbalist and Address:  [facility],  [place] [address], [place]      Provider Number: [id number]  Attending Physician Name and Address:  Patrecia Pour, MD  Relative Name and Phone Number:       Current Level of Care: Hospital Recommended Level of Care: [place] Prior Approval Number:    Date Approved/Denied:   PASRR Number:    Discharge Plan: SNF    Current Diagnoses: Patient Active Problem List   Diagnosis Date Noted  . Chronic abdominal pain 09/26/2017  . Acute panniculitis 09/26/2017  . Mass of sigmoid colon by CT scan 09/26/2017  . Diastasis recti (NO HERNIA) 09/26/2017  . Leukocytosis 09/26/2017  . 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 Bedridden 09/26/2017  . Depressive disorder   . Ingrown nail of great toe of right foot 10/24/2016  . Bilateral lower extremity edema 06/11/2016  . 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 Dyslipidemia associated with type 2 diabetes mellitus ([place]) 05/05/2016  . Hypothyroidism 06/06/2015  . Chronic kidney disease (CKD), stage III (moderate) ([place]) 12/24/2014  . Insomnia 09/09/2014  . Slow transit constipation 09/09/2014  . Peripheral autonomic neuropathy due to DM ([place]) 12/02/2013  . Chronic constipation 10/24/2013  . Gout 07/03/2013  . Anemia 02/18/2013  . Chronic pain 02/18/2013  . Depression 02/18/2013  . Obesity, Class III, BMI 40-49.9 (morbid obesity) ([place]) 04/12/2007  . Essential hypertension 04/12/2007  . DEGENERATION, DISC NOS 04/12/2007    Orientation RESPIRATION BLADDER Height & Weight     Self, Time, Situation, Place  O2 Continent Weight: (!) 332 lb (150.6 kg) Height:  5\' 9"  (175.3 cm)  BEHAVIORAL  SYMPTOMS/MOOD NEUROLOGICAL BOWEL NUTRITION STATUS      Continent Diet(Heart Healthy )  AMBULATORY STATUS COMMUNICATION OF NEEDS Skin   Extensive Assist Verbally Other (Comment)(Incision-Abdomen)                       Personal Care Assistance Level of Assistance  Bathing, Feeding, Dressing Bathing Assistance: Limited assistance Feeding assistance: Independent Dressing Assistance: Limited assistance     Functional Limitations Info  Sight, Hearing, Speech Sight Info: Adequate Hearing Info: Adequate Speech Info: Adequate    SPECIAL CARE FACTORS FREQUENCY                       Contractures Contractures Info: Not present    Additional Factors Info  Code Status, Allergies, Psychotropic Code Status Info: Prior Allergies Info: Nsaids, Shellfish Allergy Psychotropic Info: Prozac         Current Medications (09/27/2017):  This is the current hospital active medication list Current Facility-Administered Medications  Medication Dose Route Frequency Provider Last Rate Last Dose  . allopurinol (ZYLOPRIM) tablet 200 mg  200 mg Oral Daily Michael Boston, MD   200 mg at 09/26/17 2231  . alum & mag hydroxide-simeth (MAALOX/MYLANTA) 200-200-20 MG/5ML suspension 30 mL  30 mL Oral Q6H PRN Michael Boston, MD      . amitriptyline (ELAVIL) tablet 100 mg  100 mg Oral Ardeen Fillers, MD   100 mg at 09/26/17 2232  . bisacodyl (DULCOLAX) suppository 10 mg  10 mg Rectal Q12H PRN Michael Boston, MD      .  bisacodyl (DULCOLAX) suppository 10 mg  10 mg Rectal Daily Michael Boston, MD      . colchicine tablet 0.6 mg  0.6 mg Oral Daily Michael Boston, MD   0.6 mg at 09/27/17 1011  . diphenhydrAMINE (BENADRYL) capsule 25 mg  25 mg Oral Q6H PRN Michael Boston, MD      . diphenhydrAMINE (BENADRYL) injection 12.5-25 mg  12.5-25 mg Intravenous Q6H PRN Michael Boston, MD      . FLUoxetine (PROZAC) capsule 60 mg  60 mg Oral Daily Michael Boston, MD   60 mg at 09/27/17 1011  . gabapentin (NEURONTIN) capsule  300 mg  300 mg Oral Ardeen Fillers, MD   300 mg at 09/26/17 2237  . gabapentin (NEURONTIN) capsule 300 mg  300 mg Oral TID Michael Boston, MD   300 mg at 09/27/17 1011  . guaiFENesin-dextromethorphan (ROBITUSSIN DM) 100-10 MG/5ML syrup 10 mL  10 mL Oral Q4H PRN Michael Boston, MD      . hydrALAZINE (APRESOLINE) injection 5-20 mg  5-20 mg Intravenous Q6H PRN Michael Boston, MD      . hydrocortisone (ANUSOL-HC) 2.5 % rectal cream 1 application  1 application Topical QID PRN Michael Boston, MD      . hydrocortisone cream 1 % 1 application  1 application Topical TID PRN Michael Boston, MD      . HYDROmorphone (DILAUDID) injection 0.5-2 mg  0.5-2 mg Intravenous Q2H PRN Michael Boston, MD      . lactated ringers bolus 1,000 mL  1,000 mL Intravenous Q8H PRN Gross, Remo Lipps, MD      . levothyroxine (SYNTHROID, LEVOTHROID) tablet 50 mcg  50 mcg Oral QAC breakfast Michael Boston, MD   50 mcg at 09/26/17 2237  . lip balm (CARMEX) ointment 1 application  1 application Topical BID Michael Boston, MD   1 application at 16/96/78 2004  . magic mouthwash  15 mL Oral QID PRN Michael Boston, MD      . menthol-cetylpyridinium (CEPACOL) lozenge 3 mg  1 lozenge Oral PRN Michael Boston, MD      . methocarbamol (ROBAXIN) tablet 1,000 mg  1,000 mg Oral Q6H PRN Michael Boston, MD       Or  . methocarbamol (ROBAXIN) 500 mg in dextrose 5 % 50 mL IVPB  500 mg Intravenous Q6H PRN Michael Boston, MD      . metoprolol tartrate (LOPRESSOR) injection 5 mg  5 mg Intravenous Q6H PRN Michael Boston, MD      . nystatin (MYCOSTATIN/NYSTOP) topical powder   Topical BID Michael Boston, MD      . ondansetron [facility]) injection 4 mg  4 mg Intravenous Q6H PRN Michael Boston, MD       Or  . ondansetron (ZOFRAN) 8 mg in sodium chloride 0.9 % 50 mL IVPB  8 mg Intravenous Q6H PRN Michael Boston, MD      . oxyCODONE (Oxy IR/ROXICODONE) immediate release tablet 5-10 mg  5-10 mg Oral Q4H PRN Michael Boston, MD      . pantoprazole (PROTONIX) EC tablet 40 mg  40  mg Oral Daily Michael Boston, MD   40 mg at 09/27/17 1011  . polyethylene glycol (MIRALAX / GLYCOLAX) packet 17 g  17 g Oral Q12H PRN Michael Boston, MD      . polyethylene glycol (MIRALAX / GLYCOLAX) packet 34 g  34 g Oral TID Michael Boston, MD   34 g at 09/27/17 1011  . prochlorperazine (COMPAZINE) injection 5-10 mg  5-10 mg Intravenous Q4H PRN  Michael Boston, MD      . psyllium (HYDROCIL/METAMUCIL) packet 1 packet  1 packet Oral BID Michael Boston, MD   1 packet at 09/26/17 2232  . senna-docusate (Senokot-S) tablet 2 tablet  2 tablet Oral BID Rise Patience, MD   2 tablet at 09/27/17 1011     Discharge Medications: Please see discharge summary for a list of discharge medications.  Relevant Imaging Results:  Relevant Lab Results:   Additional Information ssn:[id number]  Lia Hopping, LCSW

## 2017-09-27 NOTE — Plan of Care (Signed)
  Completed/[facility] Behavior/Discharge Planning: Ability to manage health-related needs will improve 09/27/2017 1546 - Completed/Met by Milderd Meager, RN Clinical Measurements: Ability to maintain clinical measurements within normal limits will improve 09/27/2017 1546 - Completed/Met by Milderd Meager, RN Will remain free from infection 09/27/2017 1546 - Completed/Met by Milderd Meager, RN Diagnostic test results will improve 09/27/2017 1546 - Completed/Met by Milderd Meager, RN Respiratory complications will improve 09/27/2017 1546 - Completed/Met by Milderd Meager, RN Cardiovascular complication will be avoided 09/27/2017 1546 - Completed/Met by Milderd Meager, RN Activity: Risk for activity intolerance will decrease 09/27/2017 1546 - Completed/Met by Milderd Meager, RN Nutrition: Adequate nutrition will be maintained 09/27/2017 1546 - Completed/Met by Milderd Meager, RN Coping: Level of anxiety will decrease 09/27/2017 1546 - Completed/Met by Milderd Meager, RN Elimination: Will not experience complications related to bowel motility 09/27/2017 1546 - Completed/Met by Milderd Meager, RN Will not experience complications related to urinary retention 09/27/2017 1546 - Completed/Met by Milderd Meager, RN Pain Managment: General experience of comfort will improve 09/27/2017 1546 - Completed/Met by Milderd Meager, RN Safety: Ability to remain free from injury will improve 09/27/2017 1546 - Completed/Met by Milderd Meager, RN Skin Integrity: Risk for impaired skin integrity will decrease 09/27/2017 1546 - Completed/Met by Milderd Meager, RN

## 2017-09-27 NOTE — Discharge Summary (Signed)
Physician Discharge Summary  Lauren Barber GLO:[id number] DOB: 08/25/1956 DOA: 09/25/2017  PCP: No primary care provider on file.  Admit date: 09/25/2017 Discharge date: 09/27/2017  [facility] Dis[facility]: [facility]   Recommendations for Outpatient Follow-up:  1. Follow up with general surgery in 2 weeks 2. Continue bowel regimen as below for constipation Tx/prevention.  3. Recheck CBC in 1 week to monitor leukocytosis, suspected to be reactive.   Home Health: N/A Equipment/Devices: Per SNF Discharge Condition: Stable CODE STATUS: Full Diet recommendation: High fiber, carbohydrate limited  Brief/Interim Summary: Lauren Barber is a 61 y.o. female with medical history significant of obesity, DM2, HTN who presented to the ED with acute onset abd pain that started on the day prior to hospital admission to CDU with abdominal distention. Patient has denied nausea or vomiting on presentation. Prior to this episode, patient reports history of constipation. Reported bowel movement was one day prior to hospital admission with reported small amounts of stool only. She currently still feels "full." Otherwise, patient denies fevers or chills  ED Course: In emergency department, patient was noted have a white blood count of 16.6 thousand. CT scan of the abdomen pelvis had findings consistent with constipation with a broad based umbilical hernia containing bowel without proximal obstruction. Hospitalist service was consulted for consideration for admission. General surgery was consulted and performed urgent diagnostic laparoscopy and LOA 11/8. No incarcerated hernia was noted, just significant left colon stool burden. Bowel regimen was started with good effect and resolution of pain.   Discharge Diagnoses:  Active Problems:   Obesity, Class III, BMI 40-49.9 (morbid obesity) (HCC)   Chronic constipation   Chronic kidney disease (CKD), stage III (moderate) (H[facility])   Type II  diabetes mellitus with peripheral autonomic neuropathy (H[facility])   Chronic abdominal pain   Acute panniculitis   Mass of sigmoid colon by CT scan   Diastasis recti (NO HERNIA)   Leukocytosis   Functional left-sided megacolon   Bedridden  Abdominal pain due to severe constipation: No incarcerated hernia found at time of surgery.  - Miralax 34g TID / psyllium 1 packet BID / senna BID / dulcolax suppository daily, or prn to maintain regular soft BMs  s/p Dx lap 11/8 by Dr. Johney Maine  - Follow up with general surgery in 2 weeks. - Wound care: Leave water proof dressings on incisions for 3 days post-op then remove. May cover incisions as needed. Pt may shower but not submerge incisions in a bath.   4.9 cm diameter rounded filling defect in the low sigmoid colon may represent a large polypoid lesion.  - Could consider barium enema vs. colonoscopy.   Other chronic conditions appeared stable and were treated with no changes in home medications.   Discharge Instructions Discharge Instructions    Increase activity slowly   Complete by:  As directed      Allergies as of 09/27/2017      Reactions   Nsaids    CKD Cr 2   Shellfish Allergy       Medication List    STOP taking these medications   docusate sodium 100 MG capsule Commonly known as:  COLACE     TAKE these medications   acetaminophen 325 MG tablet Commonly known as:  TYLENOL Take 650 mg every 6 (six) hours as needed by mouth for headache.   allopurinol 100 MG tablet Commonly known as:  ZYLOPRIM Take 200 mg by mouth daily.   amitriptyline 100 MG tablet Commonly  known as:  ELAVIL Take 100 mg by mouth at bedtime.   BIOFREEZE ROLL-ON EX Apply 1 application 4 (four) times daily topically. Apply to joints four times a day for joint pain.   bisacodyl 10 MG suppository Commonly known as:  DULCOLAX Place 1 suppository (10 mg total) daily rectally. Start taking on:  09/28/2017   colchicine 0.6 MG tablet Take 0.6 mg by mouth  daily.   diclofenac sodium 1 % Gel Commonly known as:  VOLTAREN Apply 4 g every 8 (eight) hours topically.   diphenhydrAMINE 25 MG tablet Commonly known as:  BENADRYL Take 25 mg every 8 (eight) hours as needed by mouth for allergies.   EPIPEN 0.3 mg/0.3 mL Devi Generic drug:  EPINEPHrine Inject 0.3 mg daily as needed into the muscle (allergic reaction).   fenofibrate 48 MG tablet Commonly known as:  TRICOR Take 48 mg by mouth at bedtime.   FLUoxetine 20 MG capsule Commonly known as:  PROZAC Take 60 mg daily by mouth.   gabapentin 300 MG capsule Commonly known as:  NEURONTIN Take 300 mg by mouth 3 (three) times daily.   HYDROcodone-acetaminophen 5-325 MG tablet Commonly known as:  NORCO/VICODIN Take 1 tablet daily as needed by mouth for moderate pain. What changed:    when to take this  reasons to take this   hydrocortisone cream 1 % Apply 1 application 2 (two) times daily as needed topically (for irritated area).   levothyroxine 50 MCG tablet Commonly known as:  SYNTHROID, LEVOTHROID Take 1 tablet (50 mcg total) by mouth daily.   linagliptin 5 MG Tabs tablet Commonly known as:  TRADJENTA Take 5 mg by mouth daily.   Melatonin 3 MG Tabs Take 3 mg by mouth at bedtime.   nystatin powder Commonly known as:  MYCOSTATIN/NYSTOP Apply 2 (two) times daily topically. under panniculus and skin folds   pantoprazole 40 MG tablet Commonly known as:  PROTONIX Take 1 tablet (40 mg total) by mouth daily.   polyethylene glycol packet Commonly known as:  MIRALAX / GLYCOLAX Take 34 g 3 (three) times daily by mouth. What changed:    how much to take  when to take this   polyvinyl alcohol 1.4 % ophthalmic solution Commonly known as:  LIQUIFILM TEARS Place 1 drop into both eyes every 4 (four) hours as needed for dry eyes.   psyllium 95 % Pack Commonly known as:  HYDROCIL/METAMUCIL Take 1 packet 2 (two) times daily by mouth.   sennosides-docusate sodium 8.6-50 MG  tablet Commonly known as:  SENOKOT-S Take 2 tablets 2 (two) times daily by mouth. What changed:  how much to take   sodium chloride 0.65 % Soln nasal spray Commonly known as:  OCEAN Place 1 spray into both nostrils as needed for congestion.   spironolactone 25 MG tablet Commonly known as:  ALDACTONE Take 25 mg by mouth daily.   tiZANidine 2 MG tablet Commonly known as:  ZANAFLEX Take 2 mg 4 (four) times daily by mouth.   torsemide 10 MG tablet Commonly known as:  DEMADEX Take 40 mg daily by mouth.   traMADol 50 MG tablet Commonly known as:  ULTRAM Take 2 tablets (100 mg total) every 12 (twelve) hours as needed by mouth. What changed:    when to take this  reasons to take this   Vitamin D (Ergocalciferol) 50000 units Caps capsule Commonly known as:  DRISDOL Take 50,000 Units by mouth every 7 (seven) days. Every Wednesday      Follow-up  [facility], [place]. Schedule an appointment as soon as possible for a visit in 2 week(s).   Specialty:  General Surgery Why:  for follow up to have your incisions checked  Contact information: [address] [place], [employer] Follow up.   Specialty:  [place] information: [place] [address] [phone]          Allergies  Allergen Reactions  . Nsaids     CKD Cr 2  . Shellfish Allergy     Consultations:  General Surgery, Dr. Johney Maine  Procedures/Studies: Ct Renal Stone Study  Result Date: 09/26/2017 CLINICAL DATA:  Sudden onset abdominal pain and distention with pain radiating to the low back. History of renal failure, hypertension, endometriosis. EXAM: CT ABDOMEN AND PELVIS WITHOUT CONTRAST TECHNIQUE: Multidetector CT imaging of the abdomen and pelvis was performed following the standard protocol without IV contrast. COMPARISON:  05/09/2017 FINDINGS: Lower chest:  Atelectasis in the lung bases. Hepatobiliary: Diffuse fatty infiltration of the liver. Gallbladder is surgically absent or contracted. No bile duct dilatation. Pancreas: Unremarkable. No pancreatic ductal dilatation or surrounding inflammatory changes. Spleen: Normal in size without focal abnormality. Adrenals/Urinary Tract: Adrenal glands are unremarkable. Kidneys are normal, without renal calculi, focal lesion, or hydronephrosis. Bladder is unremarkable. Stomach/Bowel: Stomach and small bowel are not abnormally distended. Diffusely stool-filled colon with prominent stool in the rectosigmoid region resulting in mild colonic dilatation. 4.9 cm diameter rounded filling defect in the low sigmoid colon is again demonstrated. This was present to represent a stool ball on the previous study but persistence of this lesion may indicate a large polypoid lesion. Consider colonoscopy for further evaluation. Appendix is not identified. Vascular/Lymphatic: Aortic atherosclerosis. No enlarged abdominal or pelvic lymph nodes. Reproductive: Uterus and bilateral adnexa are unremarkable. Other: Laxity of the anterior abdominal wall with probable broad-based periumbilical hernia containing bowel. No proximal obstruction. No free air or free fluid in the abdomen. Musculoskeletal: Degenerative changes in the spine. No destructive bone lesions. IMPRESSION: 1. Diffuse fatty infiltration of the liver. 2. 4.9 cm diameter rounded filling defect in the low sigmoid colon may represent a large polypoid lesion. Consider colonoscopy for further evaluation. 3. Prominent stool-filled rectosigmoid colon resulting in mild colonic dilatation, likely representing constipation. 4. Aortic atherosclerosis. 5. Broad-based periumbilical hernia containing bowel without proximal obstruction. 6. No renal or ureteral stone or obstruction. Electronically Signed   By: Lucienne Capers M.D.   On: 09/26/2017 01:15   PROCEDURE 09/26/2017: DIAGNOSTIC  LAPAROSCOPY Laparoscopic lysis of adhesions  SURGEON:  Adin Hector, MD  PRE-OPERATIVE DIAGNOSIS:  Incarcerated ventral incisional hernia  POST-OPERATIVE DIAGNOSIS:    Diastasis recti Massive left-sided colon distention with constipation. No hernia.   Subjective: Abdominal pain gone, had very large BM yesterday and passing much flatus today. Eating well. No nausea or vomiting or other complaints.   Discharge Exam: BP (!) 110/56 (BP Location: Left Arm)   Pulse 93   Temp 97.6 F (36.4 C) (Oral)   Resp 18   Ht 5\' 9"  (1.753 m)   Wt (!) 150.6 kg (332 lb)   SpO2 100%   BMI 49.03 kg/m   General: Pleasant obese female in no distress Cardiovascular: RRR, S1/S2 +, no rubs, no gallops Respiratory: CTA bilaterally, no wheezing, no rhonchi Abdominal: Soft, NT, ND, bowel sounds + Extremities: No edema, no cyanosis  Labs: Basic Metabolic Panel: Recent Labs  Lab 09/26/17 0225  NA 136  K 4.5  CL 100*  CO2 25  GLUCOSE 161*  BUN 39*  CREATININE 2.11*  CALCIUM 9.3   Liver Function Tests: Recent Labs  Lab 09/26/17 0225  AST 36  ALT 31  ALKPHOS 67  BILITOT 0.5  PROT 8.1  ALBUMIN 3.7   Recent Labs  Lab 09/26/17 0225  LIPASE 27   CBC: Recent Labs  Lab 09/26/17 0225  WBC 16.6*  NEUTROABS 13.7*  HGB 10.4*  HCT 32.2*  MCV 97.9  PLT 313   CBG: Recent Labs  Lab 09/26/17 1138 09/26/17 1648  GLUCAP 147* 161*   Urinalysis    Component Value Date/Time   COLORURINE YELLOW 09/26/2017 0212   APPEARANCEUR HAZY (A) 09/26/2017 0212   LABSPEC 1.011 09/26/2017 0212   PHURINE 5.0 09/26/2017 0212   GLUCOSEU NEGATIVE 09/26/2017 0212   HGBUR NEGATIVE 09/26/2017 0212   BILIRUBINUR NEGATIVE 09/26/2017 0212   KETONESUR NEGATIVE 09/26/2017 0212   PROTEINUR NEGATIVE 09/26/2017 0212   UROBILINOGEN 0.2 12/16/2014 1449   NITRITE NEGATIVE 09/26/2017 0212   LEUKOCYTESUR MODERATE (A) 09/26/2017 0212   Time coordinating discharge: Approximately 40 minutes  Vance Gather,  MD  [facility] 09/27/2017, 12:54 PM Pager [phone]

## 2017-09-27 NOTE — Final Consult Note (Signed)
Consultant Final Sign-Off Note    Assessment/Final recommendations  Lauren Barber is a 61 y.o. female followed by me for abdominal pain. S/P diagnostic laparoscopy by Dr. Johney Maine on 11/08 for presumed hernia. Pt found to have diastasis recti and massive left-sided colon distention with constipation. No hernia.    Wound care (if applicable): Leave water proof dressings on incisions for 3 days post-op then remove. May cover incisions as needed. Pt may shower but not submerge incisions in a bath.    Diet at discharge: per primary team, high fiber   Activity at discharge: per primary team   Follow-up appointment:  2 weeks in our clinic to check incisions   Pending results:  Unresulted Labs (From admission, onward)   None       Medication recommendations: miralax and enemas for large stool burden   Other recommendations:    Thank you for allowing [place] to participate in the care of your patient!  Please consult [place] again if you have further needs for your patient.  Kalman Drape 09/27/2017 9:37 AM    Subjective   CC: abdominal pain  Pt states pain is improved since admission. She states she had a large BM last night and thinks she was having one during my visit with her. No nausea or vomiting. No new complaints.    Objective  Vital signs in last 24 hours: Temp:  [97.6 F (36.4 C)-98.7 F (37.1 C)] 97.6 F (36.4 C) (11/09 0559) Pulse Rate:  [89-109] 92 (11/09 0559) Resp:  [12-24] 17 (11/09 0559) BP: (101-135)/(57-80) 126/62 (11/09 0559) SpO2:  [96 %-100 %] 100 % (11/09 0559)  PE;  Physical Exam  Constitutional: She is oriented to person, place, and time. She appears well-developed and well-nourished. No distress.  HENT:  Head: Normocephalic and atraumatic.  Cardiovascular: Normal rate, regular rhythm and normal heart sounds.  Pulmonary/Chest: Effort normal. No respiratory distress.  Abdominal: Soft. Bowel sounds are normal.  Obese abdomen, incisions with clean  dressings, mild TTP in suprapubic region, no guarding  Neurological: She is alert and oriented to person, place, and time.  Skin: Skin is warm and dry. She is not diaphoretic.  Nursing note and vitals reviewed.   Pertinent labs and Studies: Recent Labs    09/26/17 0225  WBC 16.6*  HGB 10.4*  HCT 32.2*   BMET Recent Labs    09/26/17 0225  NA 136  K 4.5  CL 100*  CO2 25  GLUCOSE 161*  BUN 39*  CREATININE 2.11*  CALCIUM 9.3   No results for input(s): LABURIN in the last 72 hours. Results for orders placed or performed during the hospital encounter of 06/01/15  Culture, blood (routine x 2)     Status: None   Collection Time: 06/01/15  9:14 PM  Result Value Ref Range Status   Specimen Description BLOOD PICC LINE  Final   Special Requests BOTTLES DRAWN AEROBIC AND ANAEROBIC 8CC  Final   Culture NO GROWTH 5 DAYS  Final   Report Status 06/06/2015 FINAL  Final  Culture, blood (routine x 2)     Status: None   Collection Time: 06/01/15  9:21 PM  Result Value Ref Range Status   Specimen Description BLOOD LEFT HAND  Final   Special Requests BOTTLES DRAWN AEROBIC AND ANAEROBIC 5CC  Final   Culture NO GROWTH 5 DAYS  Final   Report Status 06/06/2015 FINAL  Final  MRSA PCR Screening     Status: Abnormal   Collection Time: 06/02/15  2:39 AM  Result Value Ref Range Status   MRSA by PCR POSITIVE (A) NEGATIVE Final    Comment:        The GeneXpert MRSA Assay (FDA approved for NASAL specimens only), is one component of a comprehensive MRSA colonization surveillance program. It is not intended to diagnose MRSA infection nor to guide or monitor treatment for MRSA infections. RESULT CALLED TO, READ BACK BY AND VERIFIED WITH: CARSON,J RN [id number] AT 0503 SKEEN,P   Urine culture     Status: None   Collection Time: 06/02/15  3:06 AM  Result Value Ref Range Status   Specimen Description URINE, CLEAN CATCH  Final   Special Requests NONE  Final   Culture 3,000 COLONIES/mL  INSIGNIFICANT GROWTH  Final   Report Status 06/04/2015 FINAL  Final    Imaging: No results found.  Jackson Latino, [facility] Pager [phone]

## 2017-09-27 NOTE — Care Management Obs Status (Signed)
[place] NOTIFICATION   Patient Details  Name: Shela Esses MRN: [id number] Date of Birth: May 18, 1956   Medicare Observation Status Notification Given:  Yes    Guadalupe Maple, RN 09/27/2017, 11:08 AM

## 2017-09-27 NOTE — Clinical Social Work Note (Signed)
Clinical Social Work Assessment  Patient Details  Name: Lauren Barber MRN: [id number] Date of Birth: 01-Jul-1956  Date of referral:  09/27/17               Reason for consult:  Facility Placement                Permission sought to share information with:    Permission granted to share information::     Name::        Agency::  [facility]  Relationship::     Contact Information:     Housing/Transportation Living arrangements for the past 2 months:  [place] of Information:  Patient Patient Interpreter Needed:  None Criminal Activity/Legal Involvement Pertinent to Current Situation/Hospitalization:  No - Comment as needed Significant Relationships:  Adult Children Lives with:  Facility Resident Do you feel safe going back to the place where you live?  Yes Need for family participation in patient care:  Yes (Comment)  Care giving concerns:  Patient is a Long term patient at  [facility], patient has no concerns at this time.   Social Worker assessment / plan:  CSW met with patient at bedside. Patient reports she has been a resident at [facility] for the past for years. She reports the facility is okay, she hopeful to leave one day. Patient reports while at the faility she is bed bound. A few times a week PT will come to her room and complete exercises with her legs and arms.  Patient states she has a son that is also in a SNF.  Facility will accept patient return. Patient will transport by [facility].  Assessment complete.   Plan: Return to [facility]  Employment status:  Disabled (Comment on whether or not currently receiving Disability) Insurance information:  Managed Medicare PT Recommendations:  Not assessed at this time Information / Referral to community resources:  [place]  Patient/Family's Response to care:  Agreeable and Responding well to care. Appreciative of CSW services.   Patient/Family's Understanding of and Emotional Response  to Diagnosis, Current Treatment, and Prognosis:  Patient reports she came in for abdominal pain but is feeling "alot better now."  Emotional Assessment Appearance:  Developmentally appropriate, Appears stated age Attitude/Demeanor/Rapport:    Affect (typically observed):  Accepting, Calm Orientation:  Oriented to Self, Oriented to Place, Oriented to  Time, Oriented to Situation Alcohol / Substance use:  Not Applicable Psych involvement (Current and /or in the community):  No (Comment)  Discharge Needs  Concerns to be addressed:  Discharge Planning Concerns Readmission within the last 30 days:  Yes Current discharge risk:  None Barriers to Discharge:  Continued Medical Work up   [employer], LCSW 09/27/2017, 10:25 AM

## 2017-09-29 NOTE — Addendum Note (Signed)
Addendum  created 09/29/17 1913 by Lillia Abed, MD   Intraprocedure Blocks edited, Sign clinical note

## 2017-10-07 ENCOUNTER — Encounter: Payer: Self-pay | Admitting: Gastroenterology

## 2017-10-07 ENCOUNTER — Telehealth: Payer: Self-pay | Admitting: Gastroenterology

## 2017-10-07 NOTE — Telephone Encounter (Signed)
I met her when she was hospitalized in 2016.  During that admission I recommended colonoscopy and upper endoscopy.  She could not, would not complete the prep and so we decided to see her back in the office to discuss as an outpatient.  She never followed up with me at that point.   She needs office appointment to discuss colonoscopy and how best to get her ready for that.  Next available appointment with extender or myself.  Please do not double book me for this.

## 2017-10-07 NOTE — Telephone Encounter (Signed)
Patient has been scheduled with Alonza Bogus 10/16/17 at [facility] has been notified of appointment date and time.

## 2017-10-16 ENCOUNTER — Ambulatory Visit: Payer: Medicare Other | Admitting: Gastroenterology

## 2017-10-24 ENCOUNTER — Encounter: Payer: Self-pay | Admitting: Physician Assistant

## 2017-10-24 ENCOUNTER — Ambulatory Visit (INDEPENDENT_AMBULATORY_CARE_PROVIDER_SITE_OTHER): Payer: Medicare Other | Admitting: Physician Assistant

## 2017-10-24 VITALS — BP 124/73 | HR 94 | Ht 69.0 in

## 2017-10-24 DIAGNOSIS — R9389 Abnormal findings on diagnostic imaging of other specified body structures: Secondary | ICD-10-CM | POA: Diagnosis not present

## 2017-10-24 DIAGNOSIS — K5900 Constipation, unspecified: Secondary | ICD-10-CM

## 2017-10-24 MED ORDER — NA SULFATE-K SULFATE-MG SULF 17.5-3.13-1.6 GM/177ML PO SOLN: 1.0000 | ORAL | 0 refills | Status: DC

## 2017-10-24 MED ORDER — METOCLOPRAMIDE HCL 10 MG PO TABS: 10.0000 mg | ORAL_TABLET | ORAL | 0 refills | Status: DC

## 2017-10-24 NOTE — Patient Instructions (Signed)

## 2017-10-24 NOTE — Progress Notes (Signed)
Chief Complaint: Abnormal CT  HPI:    Lauren Barber is a 61 year old aa female with a past medical history as listed below, including being non-ambulatory after left-sided weakness from a CVA and morbid obesity, who was referred to me by ER, for a complaint of abnormal Ct.     Patient had a recent CT renal stone study 09/26/17 which revealed diffuse fatty infiltration of the liver, 4.9 cm diameter rounded filling defect in the lower sigmoid colon which may represent a large polypoid lesion, prominent stool-filled rectosigmoid colon resulting in mild colonic dilation, likely representing constipation, aortic atherosclerosis, broad-based periumbilical hernia containing bowel without proximal obstruction and no renal or ureteral stone or obstruction.  Prior to this patient had a CT abdomen and pelvis without contrast 05/09/17 which showed no acute abnormality of the abdomen and pelvis.  There was a dense "stool ball" in the distal rectum with associated stool-filled sigmoid colon and gas distended transverse colon.  This indicated fecal impaction with no dilated small bowel or other evidence of small bowel obstruction.  Hepatic steatosis and aortic atherosclerosis.    Per review of chart patient was admitted to the hospital 09/25/17-09/27/17 with acute onset of abdominal pain that had started on the day prior to hospital admission with abdominal distention.  In the ER, patient was noted to have a white blood cell count of 16.6 and a CT showing findings consistent with constipation and a broad-based umbilical hernia containing bowel without proximal obstruction.  General surgery was consulted and performed urgent diagnostic laparoscopic and LOA on 11/8.  Bowel regimen was started with good effect and resolution of pain.  Patient was continued on MiraLAX 34 g 3 times daily, psyllium 1 packet twice daily and senna twice daily as well as a Dulcolax suppository daily.    Patient has been seen in the hospital for Dr.  Ardis Hughs for FOBT plus anemia in the past and was unable to finish a bowel prep and in turn did not have her procedures. She failed to follow up in our outpatient clinic at that time.    Today, patient presents to clinic via stretcher and EMT from her nursing facility.  She describes that "I do not know anything about my health".  Patient tells me she was recently in the hospital as above and that she had surgery where they "let down some of my colon".  Patient tells me she has been on a regimen of MiraLAX and Metamucil 3 times a day since returning to her nursing facility and has been maintaining a daily bowel movement, sometimes this is "explosive" and other times it is just a small movement.  Patient tells me she is unaware about any abnormal imaging of her abdomen, explaining that "no one tells me anything".    Patient denies fever, chills, blood in stool, melena, weight loss, anorexia, nausea, vomiting or symptoms that awaken her at night.  Past Medical History:  Diagnosis Date  . Anemia   . Anxiety   . Atopic conjunctivitis   . Cardiomegaly   . Cellulitis    legs  . Cellulitis of right thigh 08/13/2016  . CVA (cerebral vascular accident) ([place])   . Degenerative, intervertebral disc    back, legs  . Depressive disorder    depressive disorder  . Diabetes mellitus without complication ([place])   . Endometriosis   . Essential hypertension 04/12/2007   Qualifier: Diagnosis of  By: Jobe Igo MD, Shanon Brow    . Gout    feet  .  Headache(784.0)    otc meds prn  . History of blood transfusion [place] - unsure number of units transfused  . Hypertension   . Insomnia   . Neuromuscular disorder ([place])    diabetic neuropathy - feet  . Obesities, morbid ([place])   . Stroke [facility]) 2013  . Vaginal bleeding     Past Surgical History:  Procedure Laterality Date  . BIOPSY ENDOMETRIAL N/A 10 03 2013  . [place]   x 2  . DILATION AND CURETTAGE OF UTERUS  2013  . HYSTEROSCOPY W/D&C  N/A 06/28/2014   Procedure: DILATATION AND CURETTAGE /HYSTEROSCOPY;  Surgeon: Lavonia Drafts, MD;  Location: [facility];  Service: Gynecology;  Laterality: N/A;  . INCISIONAL HERNIA REPAIR N/A 09/26/2017   Procedure: DIAGNOSTIC LAPAROSCOPY;  Surgeon: Michael Boston, MD;  Location: [facility];  Service: General;  Laterality: N/A;  . TONSILLECTOMY    . WISDOM TOOTH EXTRACTION      Current Outpatient Medications  Medication Sig Dispense Refill  . acetaminophen (TYLENOL) 325 MG tablet Take 650 mg every 6 (six) hours as needed by mouth for headache.     . allopurinol (ZYLOPRIM) 100 MG tablet Take 200 mg by mouth daily.    Marland Kitchen amitriptyline (ELAVIL) 100 MG tablet Take 100 mg by mouth at bedtime.    . bisacodyl (DULCOLAX) 10 MG suppository Place 1 suppository (10 mg total) daily rectally. 12 suppository 0  . colchicine 0.6 MG tablet Take 0.6 mg by mouth daily.    . diclofenac sodium (VOLTAREN) 1 % GEL Apply 4 g every 8 (eight) hours topically.    . diphenhydrAMINE (BENADRYL) 25 MG tablet Take 25 mg every 8 (eight) hours as needed by mouth for allergies.     Marland Kitchen EPINEPHrine (EPIPEN) 0.3 mg/0.3 mL DEVI Inject 0.3 mg daily as needed into the muscle (allergic reaction).     . fenofibrate (TRICOR) 48 MG tablet Take 48 mg by mouth at bedtime.    Marland Kitchen FLUoxetine (PROZAC) 20 MG capsule Take 60 mg daily by mouth.    . gabapentin (NEURONTIN) 300 MG capsule Take 300 mg by mouth 3 (three) times daily.     Marland Kitchen HYDROcodone-acetaminophen (NORCO/VICODIN) 5-325 MG tablet Take 1 tablet daily as needed by mouth for moderate pain. 3 tablet 0  . hydrocortisone cream 1 % Apply 1 application 2 (two) times daily as needed topically (for irritated area).     Marland Kitchen levothyroxine (SYNTHROID, LEVOTHROID) 50 MCG tablet Take 1 tablet (50 mcg total) by mouth daily. 90 tablet 3  . linagliptin (TRADJENTA) 5 MG TABS tablet Take 5 mg by mouth daily.    . Melatonin 3 MG TABS Take 3 mg by mouth at bedtime.    . Menthol, Topical Analgesic, (BIOFREEZE  ROLL-ON EX) Apply 1 application 4 (four) times daily topically. Apply to joints four times a day for joint pain.    Marland Kitchen nystatin (MYCOSTATIN/NYSTOP) powder Apply 2 (two) times daily topically. under panniculus and skin folds 15 g 0  . pantoprazole (PROTONIX) 40 MG tablet Take 1 tablet (40 mg total) by mouth daily.    . polyethylene glycol (MIRALAX / GLYCOLAX) packet Take 34 g 3 (three) times daily by mouth. 14 each 0  . polyvinyl alcohol (LIQUIFILM TEARS) 1.4 % ophthalmic solution Place 1 drop into both eyes every 4 (four) hours as needed for dry eyes.    Marland Kitchen psyllium (HYDROCIL/METAMUCIL) 95 % PACK Take 1 packet 2 (two) times daily by mouth. 56 each   .  sennosides-docusate sodium (SENOKOT-S) 8.6-50 MG tablet Take 2 tablets 2 (two) times daily by mouth.    . sodium chloride (OCEAN) 0.65 % SOLN nasal spray Place 1 spray into both nostrils as needed for congestion.    Marland Kitchen spironolactone (ALDACTONE) 25 MG tablet Take 25 mg by mouth daily.    Marland Kitchen tiZANidine (ZANAFLEX) 2 MG tablet Take 2 mg 4 (four) times daily by mouth.    . torsemide (DEMADEX) 10 MG tablet Take 40 mg daily by mouth.    . traMADol (ULTRAM) 50 MG tablet Take 2 tablets (100 mg total) every 12 (twelve) hours as needed by mouth. 6 tablet 0  . Vitamin D, Ergocalciferol, (DRISDOL) 50000 units CAPS capsule Take 50,000 Units by mouth every 7 (seven) days. Every Wednesday     No current facility-administered medications for this visit.     Allergies as of 10/24/2017 - Review Complete 09/26/2017  Allergen Reaction Noted  . Nsaids  09/26/2017  . Shellfish allergy  05/25/2016    Family History  Problem Relation Age of Onset  . Hypertension Mother   . Hypertension Father     Social History   Socioeconomic History  . Marital status: Single    Spouse name: Not on file  . Number of children: Not on file  . Years of education: Not on file  . Highest education level: Not on file  Social Needs  . Financial resource strain: Not on file  . Food  insecurity - worry: Not on file  . Food insecurity - inability: Not on file  . Transportation needs - medical: Not on file  . Transportation needs - non-medical: Not on file  Occupational History  . Not on file  Tobacco Use  . Smoking status: Former Smoker    Packs/day: 1.00    Years: 43.00    Pack years: 43.00    Types: Cigarettes    Last attempt to quit: 06/11/2012    Years since quitting: 5.3  . Smokeless tobacco: Never Used  Substance and Sexual Activity  . Alcohol use: No  . Drug use: No  . Sexual activity: No    Birth control/protection: Post-menopausal  Other Topics Concern  . Not on file  Social History Narrative  . Not on file    Review of Systems:    Constitutional: No fever or chills Skin: No rash  Cardiovascular: No chest pain  Respiratory: Positive for shortness of breath Gastrointestinal: See HPI and otherwise negative Genitourinary: No dysuria Neurological: No headache, dizziness or syncope Musculoskeletal: Positive for muscle cramps and back pain Hematologic: No bleeding or bruising Psychiatric: No history of depression or anxiety   Physical Exam:  Vital signs: BP 124/73 Comment: vitals via EMS  Pulse 94 Comment: vitals via EMS  Ht 5\' 9"  (1.753 m)   BMI 49.03 kg/m    Constitutional:   Pleasant morbidly obese AA female appears to be in NAD, Well developed, Well nourished, alert and cooperative Head:  Normocephalic and atraumatic. Eyes:   PEERL, EOMI. No icterus. Conjunctiva pink. Ears:  Normal auditory acuity. Neck:  Supple Throat: Oral cavity and pharynx without inflammation, swelling or lesion.  Respiratory: Respirations even and unlabored. Lungs clear to auscultation bilaterally.   No wheezes, crackles, or rhonchi.  Cardiovascular: Normal S1, S2. No MRG. Regular rate and rhythm. No peripheral edema, cyanosis or pallor.  Gastrointestinal:  limited due to body habitus, soft reducible periumbilical hernia, Soft, nondistended, mild periumbilical ttp,   No rebound or guarding. Normal bowel sounds. No  appreciable masses or hepatomegaly. Rectal:  Not performed.  Msk:  Symmetrical without gross deformities. Without edema, no deformity or joint abnormality.  Neurologic:  Alert and  oriented x4;  grossly normal neurologically.  Skin:   Dry and intact without significant lesions or rashes. Psychiatric: Demonstrates good judgement and reason without abnormal affect or behaviors.  RELEVANT LABS AND IMAGING: CBC    Component Value Date/Time   WBC 16.6 (H) 09/26/2017 0225   RBC 3.29 (L) 09/26/2017 0225   HGB 10.4 (L) 09/26/2017 0225   HCT 32.2 (L) 09/26/2017 0225   PLT 313 09/26/2017 0225   MCV 97.9 09/26/2017 0225   MCH 31.6 09/26/2017 0225   MCHC 32.3 09/26/2017 0225   RDW 16.0 (H) 09/26/2017 0225   LYMPHSABS 2.4 09/26/2017 0225   MONOABS 0.5 09/26/2017 0225   EOSABS 0.1 09/26/2017 0225   BASOSABS 0.0 09/26/2017 0225    CMP     Component Value Date/Time   NA 136 09/26/2017 0225   NA 139 02/18/2017   K 4.5 09/26/2017 0225   CL 100 (L) 09/26/2017 0225   CO2 25 09/26/2017 0225   GLUCOSE 161 (H) 09/26/2017 0225   BUN 39 (H) 09/26/2017 0225   BUN 52 (A) 02/18/2017   CREATININE 2.11 (H) 09/26/2017 0225   CREATININE 1.53 (H) 12/22/2014 0530   CALCIUM 9.3 09/26/2017 0225   PROT 8.1 09/26/2017 0225   ALBUMIN 3.7 09/26/2017 0225   AST 36 09/26/2017 0225   ALT 31 09/26/2017 0225   ALKPHOS 67 09/26/2017 0225   BILITOT 0.5 09/26/2017 0225   GFRNONAA 25 (L) 09/26/2017 0225   GFRAA 28 (L) 09/26/2017 0225   EXAM: CT ABDOMEN AND PELVIS WITHOUT CONTRAST 09/26/17  TECHNIQUE: Multidetector CT imaging of the abdomen and pelvis was performed following the standard protocol without IV contrast.  COMPARISON:  05/09/2017  FINDINGS: Lower chest: Atelectasis in the lung bases.  Hepatobiliary: Diffuse fatty infiltration of the liver. Gallbladder is surgically absent or contracted. No bile duct dilatation.  Pancreas: Unremarkable. No  pancreatic ductal dilatation or surrounding inflammatory changes.  Spleen: Normal in size without focal abnormality.  Adrenals/Urinary Tract: Adrenal glands are unremarkable. Kidneys are normal, without renal calculi, focal lesion, or hydronephrosis. Bladder is unremarkable.  Stomach/Bowel: Stomach and small bowel are not abnormally distended. Diffusely stool-filled colon with prominent stool in the rectosigmoid region resulting in mild colonic dilatation. 4.9 cm diameter rounded filling defect in the low sigmoid colon is again demonstrated. This was present to represent a stool ball on the previous study but persistence of this lesion may indicate a large polypoid lesion. Consider colonoscopy for further evaluation. Appendix is not identified.  Vascular/Lymphatic: Aortic atherosclerosis. No enlarged abdominal or pelvic lymph nodes.  Reproductive: Uterus and bilateral adnexa are unremarkable.  Other: Laxity of the anterior abdominal wall with probable broad-based periumbilical hernia containing bowel. No proximal obstruction. No free air or free fluid in the abdomen.  Musculoskeletal: Degenerative changes in the spine. No destructive bone lesions.  IMPRESSION: 1. Diffuse fatty infiltration of the liver. 2. 4.9 cm diameter rounded filling defect in the low sigmoid colon may represent a large polypoid lesion. Consider colonoscopy for further evaluation. 3. Prominent stool-filled rectosigmoid colon resulting in mild colonic dilatation, likely representing constipation. 4. Aortic atherosclerosis. 5. Broad-based periumbilical hernia containing bowel without proximal obstruction. 6. No renal or ureteral stone or obstruction.   Electronically Signed   By: Lucienne Capers M.D.   On: 09/26/2017 01:15  EXAM: CT ABDOMEN AND PELVIS WITHOUT CONTRAST  05/09/17 TECHNIQUE: Multidetector CT imaging of the abdomen and pelvis was performed following the standard protocol  without IV contrast.  COMPARISON:  CT abdomen pelvis 05/09/2017  FINDINGS: Lower chest: No pulmonary nodules. No visible pleural or pericardial effusion.  Hepatobiliary: Absolute attenuation of the liver less than 40 Hounsfield units, consistent with hepatic steatosis. No focal liver lesions. Gallbladder not visualized.  Pancreas: Normal pancreatic contours. No peripancreatic fluid collection or pancreatic ductal dilatation.  Spleen: Normal.  Adrenals/Urinary Tract: Normal adrenal glands. No hydronephrosis or nephrolithiasis. No abnormal perinephric stranding. No ureteral obstruction.  Stomach/Bowel: There is a dense stool ball in the distal rectum with a large amount of gas distended colon in the anterior abdomen. No small bowel dilatation. No free intraperitoneal air. No evidence of acute inflammation. Appendix is not clearly visualized.  Vascular/Lymphatic: There is atherosclerotic calcification of the non aneurysmal abdominal aorta. No abdominal or pelvic adenopathy.  Reproductive: Normal uterus. 4.5 cm intermediate attenuation left adnexal lesion may be a hemorrhagic cyst.  Musculoskeletal: Multilevel severe lumbar facet arthrosis. No bony spinal canal stenosis. No lytic or blastic osseous lesion. Normal visualized extrathoracic and extraperitoneal soft tissues.  Other: No contributory non-categorized findings.  IMPRESSION: 1. No acute abnormality of the abdomen or pelvis. 2. Dense stool ball in the distal rectum with associated stool-filled sigmoid colon and gas distended transverse colon. This may indicate fecal impaction. No dilated small bowel or other evidence of small-bowel obstruction. 3. Hepatic steatosis. 4.  Aortic Atherosclerosis (ICD10-I70.0).   Electronically Signed   By: Ulyses Jarred M.D.   On: 05/09/2017 16:43  Assessment: 1.  Abnormal CT renal stone study: 09/26/17 showing 4+ cm polypoid lesion in the patient's distal sigmoid  colon 2.  Constipation: Better on a regular bowel regimen per the patient, recent admission for lysis of adhesions related to an acute bowel obstruction in the beginning of November 3.  Morbid obesity: Patient is nonambulatory after a left-sided stroke and morbid obesity  Plan: 1.  Scheduled patient for a colonoscopy in the hospital due to morbid obesity and patient being nonambulatory with Dr. Ardis Hughs.  Did discuss risks, benefits, limitations and alternatives and the patient agrees to proceed. 2.  Patient to continue current bowel regimen.  Prescribed Metoclopramide 10 mg to be taken prior to first prep and a second prep to help patient with nausea.  Prescribed #2 with no refills 3.  Patient to follow in clinic per recommendations from Dr. Ardis Hughs after time of colonoscopy.  Ellouise Newer, PA-C [facility] 10/24/2017, 10:58 AM

## 2017-10-24 NOTE — H&P (View-Only) (Signed)
Chief Complaint: Abnormal CT  HPI:    Lauren Barber is a 61 year old aa female with a past medical history as listed below, including being non-ambulatory after left-sided weakness from a CVA and morbid obesity, who was referred to me by ER, for a complaint of abnormal Ct.     Patient had a recent CT renal stone study 09/26/17 which revealed diffuse fatty infiltration of the liver, 4.9 cm diameter rounded filling defect in the lower sigmoid colon which may represent a large polypoid lesion, prominent stool-filled rectosigmoid colon resulting in mild colonic dilation, likely representing constipation, aortic atherosclerosis, broad-based periumbilical hernia containing bowel without proximal obstruction and no renal or ureteral stone or obstruction.  Prior to this patient had a CT abdomen and pelvis without contrast 05/09/17 which showed no acute abnormality of the abdomen and pelvis.  There was a dense "stool ball" in the distal rectum with associated stool-filled sigmoid colon and gas distended transverse colon.  This indicated fecal impaction with no dilated small bowel or other evidence of small bowel obstruction.  Hepatic steatosis and aortic atherosclerosis.    Per review of chart patient was admitted to the hospital 09/25/17-09/27/17 with acute onset of abdominal pain that had started on the day prior to hospital admission with abdominal distention.  In the ER, patient was noted to have a white blood cell count of 16.6 and a CT showing findings consistent with constipation and a broad-based umbilical hernia containing bowel without proximal obstruction.  General surgery was consulted and performed urgent diagnostic laparoscopic and LOA on 11/8.  Bowel regimen was started with good effect and resolution of pain.  Patient was continued on MiraLAX 34 g 3 times daily, psyllium 1 packet twice daily and senna twice daily as well as a Dulcolax suppository daily.    Patient has been seen in the hospital for Dr.  Ardis Hughs for FOBT plus anemia in the past and was unable to finish a bowel prep and in turn did not have her procedures. She failed to follow up in our outpatient clinic at that time.    Today, patient presents to clinic via stretcher and EMT from her nursing facility.  She describes that "I do not know anything about my health".  Patient tells me she was recently in the hospital as above and that she had surgery where they "let down some of my colon".  Patient tells me she has been on a regimen of MiraLAX and Metamucil 3 times a day since returning to her nursing facility and has been maintaining a daily bowel movement, sometimes this is "explosive" and other times it is just a small movement.  Patient tells me she is unaware about any abnormal imaging of her abdomen, explaining that "no one tells me anything".    Patient denies fever, chills, blood in stool, melena, weight loss, anorexia, nausea, vomiting or symptoms that awaken her at night.  Past Medical History:  Diagnosis Date  . Anemia   . Anxiety   . Atopic conjunctivitis   . Cardiomegaly   . Cellulitis    legs  . Cellulitis of right thigh 08/13/2016  . CVA (cerebral vascular accident) ([place])   . Degenerative, intervertebral disc    back, legs  . Depressive disorder    depressive disorder  . Diabetes mellitus without complication ([place])   . Endometriosis   . Essential hypertension 04/12/2007   Qualifier: Diagnosis of  By: Jobe Igo MD, Shanon Brow    . Gout    feet  .  Headache(784.0)    otc meds prn  . History of blood transfusion [place] - unsure number of units transfused  . Hypertension   . Insomnia   . Neuromuscular disorder ([place])    diabetic neuropathy - feet  . Obesities, morbid ([place])   . Stroke [facility]) 2013  . Vaginal bleeding     Past Surgical History:  Procedure Laterality Date  . BIOPSY ENDOMETRIAL N/A 10 03 2013  . [place]   x 2  . DILATION AND CURETTAGE OF UTERUS  2013  . HYSTEROSCOPY W/D&C  N/A 06/28/2014   Procedure: DILATATION AND CURETTAGE /HYSTEROSCOPY;  Surgeon: Lavonia Drafts, MD;  Location: [facility];  Service: Gynecology;  Laterality: N/A;  . INCISIONAL HERNIA REPAIR N/A 09/26/2017   Procedure: DIAGNOSTIC LAPAROSCOPY;  Surgeon: Michael Boston, MD;  Location: [facility];  Service: General;  Laterality: N/A;  . TONSILLECTOMY    . WISDOM TOOTH EXTRACTION      Current Outpatient Medications  Medication Sig Dispense Refill  . acetaminophen (TYLENOL) 325 MG tablet Take 650 mg every 6 (six) hours as needed by mouth for headache.     . allopurinol (ZYLOPRIM) 100 MG tablet Take 200 mg by mouth daily.    Marland Kitchen amitriptyline (ELAVIL) 100 MG tablet Take 100 mg by mouth at bedtime.    . bisacodyl (DULCOLAX) 10 MG suppository Place 1 suppository (10 mg total) daily rectally. 12 suppository 0  . colchicine 0.6 MG tablet Take 0.6 mg by mouth daily.    . diclofenac sodium (VOLTAREN) 1 % GEL Apply 4 g every 8 (eight) hours topically.    . diphenhydrAMINE (BENADRYL) 25 MG tablet Take 25 mg every 8 (eight) hours as needed by mouth for allergies.     Marland Kitchen EPINEPHrine (EPIPEN) 0.3 mg/0.3 mL DEVI Inject 0.3 mg daily as needed into the muscle (allergic reaction).     . fenofibrate (TRICOR) 48 MG tablet Take 48 mg by mouth at bedtime.    Marland Kitchen FLUoxetine (PROZAC) 20 MG capsule Take 60 mg daily by mouth.    . gabapentin (NEURONTIN) 300 MG capsule Take 300 mg by mouth 3 (three) times daily.     Marland Kitchen HYDROcodone-acetaminophen (NORCO/VICODIN) 5-325 MG tablet Take 1 tablet daily as needed by mouth for moderate pain. 3 tablet 0  . hydrocortisone cream 1 % Apply 1 application 2 (two) times daily as needed topically (for irritated area).     Marland Kitchen levothyroxine (SYNTHROID, LEVOTHROID) 50 MCG tablet Take 1 tablet (50 mcg total) by mouth daily. 90 tablet 3  . linagliptin (TRADJENTA) 5 MG TABS tablet Take 5 mg by mouth daily.    . Melatonin 3 MG TABS Take 3 mg by mouth at bedtime.    . Menthol, Topical Analgesic, (BIOFREEZE  ROLL-ON EX) Apply 1 application 4 (four) times daily topically. Apply to joints four times a day for joint pain.    Marland Kitchen nystatin (MYCOSTATIN/NYSTOP) powder Apply 2 (two) times daily topically. under panniculus and skin folds 15 g 0  . pantoprazole (PROTONIX) 40 MG tablet Take 1 tablet (40 mg total) by mouth daily.    . polyethylene glycol (MIRALAX / GLYCOLAX) packet Take 34 g 3 (three) times daily by mouth. 14 each 0  . polyvinyl alcohol (LIQUIFILM TEARS) 1.4 % ophthalmic solution Place 1 drop into both eyes every 4 (four) hours as needed for dry eyes.    Marland Kitchen psyllium (HYDROCIL/METAMUCIL) 95 % PACK Take 1 packet 2 (two) times daily by mouth. 56 each   .  sennosides-docusate sodium (SENOKOT-S) 8.6-50 MG tablet Take 2 tablets 2 (two) times daily by mouth.    . sodium chloride (OCEAN) 0.65 % SOLN nasal spray Place 1 spray into both nostrils as needed for congestion.    Marland Kitchen spironolactone (ALDACTONE) 25 MG tablet Take 25 mg by mouth daily.    Marland Kitchen tiZANidine (ZANAFLEX) 2 MG tablet Take 2 mg 4 (four) times daily by mouth.    . torsemide (DEMADEX) 10 MG tablet Take 40 mg daily by mouth.    . traMADol (ULTRAM) 50 MG tablet Take 2 tablets (100 mg total) every 12 (twelve) hours as needed by mouth. 6 tablet 0  . Vitamin D, Ergocalciferol, (DRISDOL) 50000 units CAPS capsule Take 50,000 Units by mouth every 7 (seven) days. Every Wednesday     No current facility-administered medications for this visit.     Allergies as of 10/24/2017 - Review Complete 09/26/2017  Allergen Reaction Noted  . Nsaids  09/26/2017  . Shellfish allergy  05/25/2016    Family History  Problem Relation Age of Onset  . Hypertension Mother   . Hypertension Father     Social History   Socioeconomic History  . Marital status: Single    Spouse name: Not on file  . Number of children: Not on file  . Years of education: Not on file  . Highest education level: Not on file  Social Needs  . Financial resource strain: Not on file  . Food  insecurity - worry: Not on file  . Food insecurity - inability: Not on file  . Transportation needs - medical: Not on file  . Transportation needs - non-medical: Not on file  Occupational History  . Not on file  Tobacco Use  . Smoking status: Former Smoker    Packs/day: 1.00    Years: 43.00    Pack years: 43.00    Types: Cigarettes    Last attempt to quit: 06/11/2012    Years since quitting: 5.3  . Smokeless tobacco: Never Used  Substance and Sexual Activity  . Alcohol use: No  . Drug use: No  . Sexual activity: No    Birth control/protection: Post-menopausal  Other Topics Concern  . Not on file  Social History Narrative  . Not on file    Review of Systems:    Constitutional: No fever or chills Skin: No rash  Cardiovascular: No chest pain  Respiratory: Positive for shortness of breath Gastrointestinal: See HPI and otherwise negative Genitourinary: No dysuria Neurological: No headache, dizziness or syncope Musculoskeletal: Positive for muscle cramps and back pain Hematologic: No bleeding or bruising Psychiatric: No history of depression or anxiety   Physical Exam:  Vital signs: BP 124/73 Comment: vitals via EMS  Pulse 94 Comment: vitals via EMS  Ht 5\' 9"  (1.753 m)   BMI 49.03 kg/m    Constitutional:   Pleasant morbidly obese AA female appears to be in NAD, Well developed, Well nourished, alert and cooperative Head:  Normocephalic and atraumatic. Eyes:   PEERL, EOMI. No icterus. Conjunctiva pink. Ears:  Normal auditory acuity. Neck:  Supple Throat: Oral cavity and pharynx without inflammation, swelling or lesion.  Respiratory: Respirations even and unlabored. Lungs clear to auscultation bilaterally.   No wheezes, crackles, or rhonchi.  Cardiovascular: Normal S1, S2. No MRG. Regular rate and rhythm. No peripheral edema, cyanosis or pallor.  Gastrointestinal:  limited due to body habitus, soft reducible periumbilical hernia, Soft, nondistended, mild periumbilical ttp,   No rebound or guarding. Normal bowel sounds. No  appreciable masses or hepatomegaly. Rectal:  Not performed.  Msk:  Symmetrical without gross deformities. Without edema, no deformity or joint abnormality.  Neurologic:  Alert and  oriented x4;  grossly normal neurologically.  Skin:   Dry and intact without significant lesions or rashes. Psychiatric: Demonstrates good judgement and reason without abnormal affect or behaviors.  RELEVANT LABS AND IMAGING: CBC    Component Value Date/Time   WBC 16.6 (H) 09/26/2017 0225   RBC 3.29 (L) 09/26/2017 0225   HGB 10.4 (L) 09/26/2017 0225   HCT 32.2 (L) 09/26/2017 0225   PLT 313 09/26/2017 0225   MCV 97.9 09/26/2017 0225   MCH 31.6 09/26/2017 0225   MCHC 32.3 09/26/2017 0225   RDW 16.0 (H) 09/26/2017 0225   LYMPHSABS 2.4 09/26/2017 0225   MONOABS 0.5 09/26/2017 0225   EOSABS 0.1 09/26/2017 0225   BASOSABS 0.0 09/26/2017 0225    CMP     Component Value Date/Time   NA 136 09/26/2017 0225   NA 139 02/18/2017   K 4.5 09/26/2017 0225   CL 100 (L) 09/26/2017 0225   CO2 25 09/26/2017 0225   GLUCOSE 161 (H) 09/26/2017 0225   BUN 39 (H) 09/26/2017 0225   BUN 52 (A) 02/18/2017   CREATININE 2.11 (H) 09/26/2017 0225   CREATININE 1.53 (H) 12/22/2014 0530   CALCIUM 9.3 09/26/2017 0225   PROT 8.1 09/26/2017 0225   ALBUMIN 3.7 09/26/2017 0225   AST 36 09/26/2017 0225   ALT 31 09/26/2017 0225   ALKPHOS 67 09/26/2017 0225   BILITOT 0.5 09/26/2017 0225   GFRNONAA 25 (L) 09/26/2017 0225   GFRAA 28 (L) 09/26/2017 0225   EXAM: CT ABDOMEN AND PELVIS WITHOUT CONTRAST 09/26/17  TECHNIQUE: Multidetector CT imaging of the abdomen and pelvis was performed following the standard protocol without IV contrast.  COMPARISON:  05/09/2017  FINDINGS: Lower chest: Atelectasis in the lung bases.  Hepatobiliary: Diffuse fatty infiltration of the liver. Gallbladder is surgically absent or contracted. No bile duct dilatation.  Pancreas: Unremarkable. No  pancreatic ductal dilatation or surrounding inflammatory changes.  Spleen: Normal in size without focal abnormality.  Adrenals/Urinary Tract: Adrenal glands are unremarkable. Kidneys are normal, without renal calculi, focal lesion, or hydronephrosis. Bladder is unremarkable.  Stomach/Bowel: Stomach and small bowel are not abnormally distended. Diffusely stool-filled colon with prominent stool in the rectosigmoid region resulting in mild colonic dilatation. 4.9 cm diameter rounded filling defect in the low sigmoid colon is again demonstrated. This was present to represent a stool ball on the previous study but persistence of this lesion may indicate a large polypoid lesion. Consider colonoscopy for further evaluation. Appendix is not identified.  Vascular/Lymphatic: Aortic atherosclerosis. No enlarged abdominal or pelvic lymph nodes.  Reproductive: Uterus and bilateral adnexa are unremarkable.  Other: Laxity of the anterior abdominal wall with probable broad-based periumbilical hernia containing bowel. No proximal obstruction. No free air or free fluid in the abdomen.  Musculoskeletal: Degenerative changes in the spine. No destructive bone lesions.  IMPRESSION: 1. Diffuse fatty infiltration of the liver. 2. 4.9 cm diameter rounded filling defect in the low sigmoid colon may represent a large polypoid lesion. Consider colonoscopy for further evaluation. 3. Prominent stool-filled rectosigmoid colon resulting in mild colonic dilatation, likely representing constipation. 4. Aortic atherosclerosis. 5. Broad-based periumbilical hernia containing bowel without proximal obstruction. 6. No renal or ureteral stone or obstruction.   Electronically Signed   By: Lucienne Capers M.D.   On: 09/26/2017 01:15  EXAM: CT ABDOMEN AND PELVIS WITHOUT CONTRAST  05/09/17 TECHNIQUE: Multidetector CT imaging of the abdomen and pelvis was performed following the standard protocol  without IV contrast.  COMPARISON:  CT abdomen pelvis 05/09/2017  FINDINGS: Lower chest: No pulmonary nodules. No visible pleural or pericardial effusion.  Hepatobiliary: Absolute attenuation of the liver less than 40 Hounsfield units, consistent with hepatic steatosis. No focal liver lesions. Gallbladder not visualized.  Pancreas: Normal pancreatic contours. No peripancreatic fluid collection or pancreatic ductal dilatation.  Spleen: Normal.  Adrenals/Urinary Tract: Normal adrenal glands. No hydronephrosis or nephrolithiasis. No abnormal perinephric stranding. No ureteral obstruction.  Stomach/Bowel: There is a dense stool ball in the distal rectum with a large amount of gas distended colon in the anterior abdomen. No small bowel dilatation. No free intraperitoneal air. No evidence of acute inflammation. Appendix is not clearly visualized.  Vascular/Lymphatic: There is atherosclerotic calcification of the non aneurysmal abdominal aorta. No abdominal or pelvic adenopathy.  Reproductive: Normal uterus. 4.5 cm intermediate attenuation left adnexal lesion may be a hemorrhagic cyst.  Musculoskeletal: Multilevel severe lumbar facet arthrosis. No bony spinal canal stenosis. No lytic or blastic osseous lesion. Normal visualized extrathoracic and extraperitoneal soft tissues.  Other: No contributory non-categorized findings.  IMPRESSION: 1. No acute abnormality of the abdomen or pelvis. 2. Dense stool ball in the distal rectum with associated stool-filled sigmoid colon and gas distended transverse colon. This may indicate fecal impaction. No dilated small bowel or other evidence of small-bowel obstruction. 3. Hepatic steatosis. 4.  Aortic Atherosclerosis (ICD10-I70.0).   Electronically Signed   By: Ulyses Jarred M.D.   On: 05/09/2017 16:43  Assessment: 1.  Abnormal CT renal stone study: 09/26/17 showing 4+ cm polypoid lesion in the patient's distal sigmoid  colon 2.  Constipation: Better on a regular bowel regimen per the patient, recent admission for lysis of adhesions related to an acute bowel obstruction in the beginning of November 3.  Morbid obesity: Patient is nonambulatory after a left-sided stroke and morbid obesity  Plan: 1.  Scheduled patient for a colonoscopy in the hospital due to morbid obesity and patient being nonambulatory with Dr. Ardis Hughs.  Did discuss risks, benefits, limitations and alternatives and the patient agrees to proceed. 2.  Patient to continue current bowel regimen.  Prescribed Metoclopramide 10 mg to be taken prior to first prep and a second prep to help patient with nausea.  Prescribed #2 with no refills 3.  Patient to follow in clinic per recommendations from Dr. Ardis Hughs after time of colonoscopy.  Ellouise Newer, PA-C [facility] 10/24/2017, 10:58 AM

## 2017-10-24 NOTE — Progress Notes (Signed)
I agree with the above note, plan 

## 2017-10-24 NOTE — Addendum Note (Signed)
Addended by: Wyline Beady on: 10/24/2017 01:46 PM   Modules accepted: Orders, SmartSet

## 2017-11-04 ENCOUNTER — Encounter (HOSPITAL_COMMUNITY): Payer: Self-pay | Admitting: Emergency Medicine

## 2017-11-04 ENCOUNTER — Other Ambulatory Visit: Payer: Self-pay

## 2017-11-04 NOTE — Progress Notes (Addendum)
Preop instructions for:     Lauren Barber, Lauren Barber                    Date of Birth   [birth year]                         Date of Procedure:   11/14/2017    Doctor: Ardis Hughs Time to arrive at [facility]:   [id number] Report to: Admitting  Procedure: Colonoscopy Any procedure time changes, MD office will notify you!   Do not eat or drink past midnight the night before your procedure.(To include any tube feedings-must be discontinued) Reminder:Follow bowel prep instructions per MD office!   Take these morning medications only with sips of water.(or give through gastrostomy or feeding tube). [place]   Note: No Insulin or Diabetic meds should be given or taken the morning of the procedure!   Facility contact:   [facility]            Phone:  [phone]                Health Care POA: pt signs her conscent  Transportation contact phone#: [facility] [phone]  Please send day of procedure:current med list and meds last taken that day, confirm nothing by mouth status from what time, Patient Demographic info( to include DNR status, problem list, allergies)   RN contact name/phone#: [place]  [phone]                      and Fax #: [phone]  Bring Insurance card and picture ID Leave all jewelry and other valuables at place where living( no metal or rings to be worn) No contact lens Women-no make-up, no lotions,perfumes,powders Men-no colognes,lotions  Any questions day of procedure,call Endoscopy unit-[phone]!   Sent from :[facility]                   Phone:[phone]                   Fax:[phone]  Sent by :Pervis Hocking Taijuan Serviss

## 2017-11-14 ENCOUNTER — Ambulatory Visit (HOSPITAL_COMMUNITY)
Admission: RE | Admit: 2017-11-14 | Discharge: 2017-11-14 | Disposition: A | Discharge status: Home / Self Care | Payer: Medicare Other | Source: Ambulatory Visit | Attending: Gastroenterology | Admitting: Gastroenterology

## 2017-11-14 ENCOUNTER — Other Ambulatory Visit: Payer: Self-pay

## 2017-11-14 ENCOUNTER — Encounter (HOSPITAL_COMMUNITY)
Admission: RE | Disposition: A | Discharge status: Home / Self Care | Payer: Self-pay | Source: Ambulatory Visit | Attending: Gastroenterology

## 2017-11-14 ENCOUNTER — Ambulatory Visit (HOSPITAL_COMMUNITY): Payer: Medicare Other | Admitting: Certified Registered Nurse Anesthetist

## 2017-11-14 ENCOUNTER — Encounter (HOSPITAL_COMMUNITY): Payer: Self-pay

## 2017-11-14 DIAGNOSIS — G4700 Insomnia, unspecified: Secondary | ICD-10-CM | POA: Insufficient documentation

## 2017-11-14 DIAGNOSIS — M109 Gout, unspecified: Secondary | ICD-10-CM | POA: Insufficient documentation

## 2017-11-14 DIAGNOSIS — I10 Essential (primary) hypertension: Secondary | ICD-10-CM | POA: Insufficient documentation

## 2017-11-14 DIAGNOSIS — Z538 Procedure and treatment not carried out for other reasons: Secondary | ICD-10-CM | POA: Diagnosis not present

## 2017-11-14 DIAGNOSIS — Z7984 Long term (current) use of oral hypoglycemic drugs: Secondary | ICD-10-CM | POA: Insufficient documentation

## 2017-11-14 DIAGNOSIS — I69354 Hemiplegia and hemiparesis following cerebral infarction affecting left non-dominant side: Secondary | ICD-10-CM | POA: Insufficient documentation

## 2017-11-14 DIAGNOSIS — Z6841 Body Mass Index (BMI) 40.0 and over, adult: Secondary | ICD-10-CM | POA: Insufficient documentation

## 2017-11-14 DIAGNOSIS — K760 Fatty (change of) liver, not elsewhere classified: Secondary | ICD-10-CM | POA: Diagnosis not present

## 2017-11-14 DIAGNOSIS — Z886 Allergy status to analgesic agent status: Secondary | ICD-10-CM | POA: Insufficient documentation

## 2017-11-14 DIAGNOSIS — K429 Umbilical hernia without obstruction or gangrene: Secondary | ICD-10-CM | POA: Diagnosis not present

## 2017-11-14 DIAGNOSIS — E1140 Type 2 diabetes mellitus with diabetic neuropathy, unspecified: Secondary | ICD-10-CM | POA: Diagnosis not present

## 2017-11-14 DIAGNOSIS — F419 Anxiety disorder, unspecified: Secondary | ICD-10-CM | POA: Diagnosis not present

## 2017-11-14 DIAGNOSIS — Z91013 Allergy to seafood: Secondary | ICD-10-CM | POA: Insufficient documentation

## 2017-11-14 DIAGNOSIS — R9389 Abnormal findings on diagnostic imaging of other specified body structures: Secondary | ICD-10-CM

## 2017-11-14 DIAGNOSIS — I700 Atherosclerosis of aorta: Secondary | ICD-10-CM | POA: Diagnosis not present

## 2017-11-14 DIAGNOSIS — K219 Gastro-esophageal reflux disease without esophagitis: Secondary | ICD-10-CM | POA: Insufficient documentation

## 2017-11-14 DIAGNOSIS — Z87891 Personal history of nicotine dependence: Secondary | ICD-10-CM | POA: Diagnosis not present

## 2017-11-14 DIAGNOSIS — K5900 Constipation, unspecified: Secondary | ICD-10-CM

## 2017-11-14 DIAGNOSIS — E039 Hypothyroidism, unspecified: Secondary | ICD-10-CM | POA: Insufficient documentation

## 2017-11-14 DIAGNOSIS — R933 Abnormal findings on diagnostic imaging of other parts of digestive tract: Secondary | ICD-10-CM | POA: Insufficient documentation

## 2017-11-14 DIAGNOSIS — F329 Major depressive disorder, single episode, unspecified: Secondary | ICD-10-CM | POA: Insufficient documentation

## 2017-11-14 DIAGNOSIS — Z79899 Other long term (current) drug therapy: Secondary | ICD-10-CM | POA: Insufficient documentation

## 2017-11-14 DIAGNOSIS — M1990 Unspecified osteoarthritis, unspecified site: Secondary | ICD-10-CM | POA: Diagnosis not present

## 2017-11-14 HISTORY — DX: Hypothyroidism, unspecified: E03.9

## 2017-11-14 HISTORY — DX: Chronic kidney disease, unspecified: N18.9

## 2017-11-14 HISTORY — PX: COLONOSCOPY WITH PROPOFOL: SHX5780

## 2017-11-14 LAB — GLUCOSE, CAPILLARY: GLUCOSE-CAPILLARY: 112 mg/dL — AB (ref 65–99)

## 2017-11-14 SURGERY — COLONOSCOPY WITH PROPOFOL
Anesthesia: Monitor Anesthesia Care

## 2017-11-14 MED ORDER — PROPOFOL 500 MG/50ML IV EMUL
INTRAVENOUS | Status: DC | PRN
  Administered 2017-11-14: 50 ug/kg/min via INTRAVENOUS

## 2017-11-14 MED ORDER — PROPOFOL 10 MG/ML IV BOLUS
INTRAVENOUS | Status: DC | PRN
  Administered 2017-11-14 (×3): 10 mg via INTRAVENOUS

## 2017-11-14 MED ORDER — LIDOCAINE 2% (20 MG/ML) 5 ML SYRINGE
INTRAMUSCULAR | Status: DC | PRN
  Administered 2017-11-14: 100 mg via INTRAVENOUS

## 2017-11-14 MED ORDER — LACTATED RINGERS IV SOLN
INTRAVENOUS | Status: DC | PRN
  Administered 2017-11-14: 10:00:00 via INTRAVENOUS

## 2017-11-14 MED ORDER — PROPOFOL 10 MG/ML IV BOLUS
INTRAVENOUS | Status: AC
  Filled 2017-11-14: qty 60

## 2017-11-14 MED ORDER — ONDANSETRON HCL 4 MG/2ML IJ SOLN
INTRAMUSCULAR | Status: DC | PRN
  Administered 2017-11-14: 4 mg via INTRAVENOUS

## 2017-11-14 MED ORDER — SODIUM CHLORIDE 0.9 % IV SOLN: INTRAVENOUS | Status: DC

## 2017-11-14 SURGICAL SUPPLY — 21 items

## 2017-11-14 NOTE — Progress Notes (Signed)
Report called to patient nurse at [facility]. Ruffin Frederick of patient discharge instructions, physician findings, and patient condition on departure from endoscopy recovery with [facility] personnel.

## 2017-11-14 NOTE — Anesthesia Postprocedure Evaluation (Signed)
Anesthesia Post Note  Patient: Lauren Barber  Procedure(s) Performed: COLONOSCOPY WITH PROPOFOL (N/A )     Patient location during evaluation: Endoscopy Anesthesia Type: MAC Level of consciousness: awake and alert, oriented and patient cooperative Pain management: pain level controlled Vital Signs Assessment: post-procedure vital signs reviewed and stable Respiratory status: spontaneous breathing, nonlabored ventilation and respiratory function stable Cardiovascular status: blood pressure returned to baseline and stable Postop Assessment: no apparent nausea or vomiting Anesthetic complications: no    Last Vitals:  Vitals:   11/14/17 1045 11/14/17 1055  BP: (!) 141/76 (!) 137/57  Pulse:  88  Resp: (!) 21 16  Temp: 36.5 C   SpO2: 100% 100%    Last Pain:  Vitals:   11/14/17 1045  TempSrc: Oral                 Yvaine Jankowiak,E. Vernell Townley

## 2017-11-14 NOTE — Anesthesia Preprocedure Evaluation (Addendum)
Anesthesia Evaluation  Patient identified by MRN, date of birth, ID band Patient awake    Reviewed: Allergy & Precautions, NPO status , Patient's Chart, lab work & pertinent test results  History of Anesthesia Complications Negative for: history of anesthetic complications  Airway Mallampati: II  TM Distance: >3 FB Neck ROM: Full    Dental  (+) Chipped, Dental Advisory Given   Pulmonary neg pulmonary ROS, former smoker (quit 2013),    breath sounds clear to auscultation       Cardiovascular hypertension (no meds presently),  Rhythm:Regular Rate:Normal     Neuro/Psych  Headaches, Anxiety Depression CVA (L weakness), Residual Symptoms    GI/Hepatic Neg liver ROS, GERD  Medicated and Controlled,  Endo/Other  diabetes (glu 112), Oral Hypoglycemic AgentsHypothyroidism Morbid obesity  Renal/GU Renal InsufficiencyRenal disease     Musculoskeletal  (+) Arthritis ,   Abdominal (+) + obese,   Peds  Hematology negative hematology ROS (+)   Anesthesia Other Findings   Reproductive/Obstetrics                            Anesthesia Physical Anesthesia Plan  ASA: III  Anesthesia Plan: MAC   Post-op Pain Management:    Induction: Intravenous  PONV Risk Score and Plan: 2 and Treatment may vary due to age or medical condition  Airway Management Planned: Natural Airway and Simple Face Mask  Additional Equipment:   Intra-op Plan:   Post-operative Plan:   Informed Consent: I have reviewed the patients History and Physical, chart, labs and discussed the procedure including the risks, benefits and alternatives for the proposed anesthesia with the patient or authorized representative who has indicated his/her understanding and acceptance.   Dental advisory given  Plan Discussed with: CRNA and Surgeon  Anesthesia Plan Comments: (Plan routine monitors, MAC)        Anesthesia Quick  Evaluation

## 2017-11-14 NOTE — Interval H&P Note (Signed)
History and Physical Interval Note:  11/14/2017 9:11 AM  Lauren Barber  has presented today for surgery, with the diagnosis of abnormal CT scan/constipation/db  The various methods of treatment have been discussed with the patient and family. After consideration of risks, benefits and other options for treatment, the patient has consented to  Procedure(s): COLONOSCOPY WITH PROPOFOL (N/A) as a surgical intervention .  The patient's history has been reviewed, patient examined, no change in status, stable for surgery.  I have reviewed the patient's chart and labs.  Questions were answered to the patient's satisfaction.     Milus Banister

## 2017-11-14 NOTE — Transfer of Care (Signed)
Immediate Anesthesia Transfer of Care Note  Patient: Lauren Barber  Procedure(s) Performed: COLONOSCOPY WITH PROPOFOL (N/A )  Patient Location: Endoscopy Unit  Anesthesia Type:MAC  Level of Consciousness: awake and patient cooperative  Airway & Oxygen Therapy: Patient Spontanous Breathing and Patient connected to face mask oxygen  Post-op Assessment: Report given to RN and Post -op Vital signs reviewed and stable  Post vital signs: Reviewed and stable  Last Vitals:  Vitals:   11/14/17 0907  BP: (!) 144/80  Pulse: 84  Resp: 18  Temp: 36.6 C  SpO2: 100%    Last Pain:  Vitals:   11/14/17 0907  TempSrc: Oral         Complications: No apparent anesthesia complications

## 2017-11-14 NOTE — Op Note (Signed)
[facility] Patient Name: Lauren Barber Procedure Date: 11/14/2017 MRN: [id number] Attending MD: Milus Banister , MD Date of Birth: 1956/06/12 CSN: [id number] Age: 61 Admit Type: Inpatient Procedure:                Colonoscopy Indications:              Abnormal CT of the colon 09/2017, referred by ER;                            reported "4.9 cm diameter rounded filling defect in                            the low sigmoid colon may represent a large                            polypoid lesion." Providers:                Milus Banister, MD, Cleda Daub, RN, William Dalton, Technician Referring MD:              Medicines:                Monitored Anesthesia Care Complications:            No immediate complications. Estimated blood loss:                            None. Estimated Blood Loss:     Estimated blood loss: none. Procedure:                Pre-Anesthesia Assessment:                           - Prior to the procedure, a History and Physical                            was performed, and patient medications and                            allergies were reviewed. The patient's tolerance of                            previous anesthesia was also reviewed. The risks                            and benefits of the procedure and the sedation                            options and risks were discussed with the patient.                            All questions were answered, and informed consent                            was  obtained. Prior Anticoagulants: The patient has                            taken no previous anticoagulant or antiplatelet                            agents. ASA Grade Assessment: IV - A patient with                            severe systemic disease that is a constant threat                            to life. After reviewing the risks and benefits,                            the patient was deemed in satisfactory  condition to                            undergo the procedure.                           After obtaining informed consent, the colonoscope                            was passed under direct vision. Throughout the                            procedure, the patient's blood pressure, pulse, and                            oxygen saturations were monitored continuously. The                            EC-[id number] ([id number]) scope was introduced through                            the anus with the intention of advancing to the                            cecum. The scope was advanced to the transverse                            colon before the procedure was aborted. Medications                            were given. The colonoscopy was performed with                            difficulty due to poor bowel prep with stool                            present. The patient tolerated the procedure well.  The quality of the bowel preparation was poor. The                            rectum was photographed. Findings:      This was an INcomplete colonoscopy due to the very poor prep. There was       a large amount of liquid and some solid stool throughout the extent of       the examination (to the transverse colon segment). This poor prep       precluded very thorough examination of the mucosa. Small lesions may       have been missed however no obstructing cancers were noted.      There was a large, hard round ball of stool in her rectum that       correlates with the CT findings (4.9cm rounded filling defect). I was       able to manually disimpact most of this hard ball of stool after the       colonoscopy.      The exam was otherwise without abnormality on direct and retroflexion       views. Impression:               - Preparation of the colon was poor; I was only                            able to advance the scope to the transverse colon.                           - Large,  hard round stool ball was found in the                            rectum. This is almost certainly the "1.6XW rounded                            filling defect" described on recent CT scan. I                            manually disimpacted the majority of this stool                            ball after the colonoscopy. No further testing is                            necessary for this issue. Moderate Sedation:      N/A- Per Anesthesia Care Recommendation:           - Patient has a contact number available for                            emergencies. The signs and symptoms of potential                            delayed complications were discussed with the                            patient.  Return to normal activities tomorrow.                            Written discharge instructions were provided to the                            patient.                           - Resume previous diet.                           - Continue present medications.                           - Should keep her bowel moving regularly as best as                            possible. Procedure Code(s):        --- Professional ---                           [id number], 8, Colonoscopy, flexible; diagnostic,                            including collection of specimen(s) by brushing or                            washing, when performed (separate procedure) Diagnosis Code(s):        --- Professional ---                           R93.3, Abnormal findings on diagnostic imaging of                            other parts of digestive tract CPT copyright 2016 [employer]. All rights reserved. The codes documented in this report are preliminary and upon coder review may  be revised to meet current compliance requirements. Milus Banister, MD 11/14/2017 10:42:12 AM This report has been signed electronically. Number of Addenda: 0

## 2017-11-14 NOTE — Discharge Instructions (Signed)
YOU HAD AN ENDOSCOPIC PROCEDURE TODAY: Refer to the procedure report and other information in the discharge instructions given to you for any specific questions about what was found during the examination. If this information does not answer your questions, please call [place] office at [phone] to clarify.  ° °YOU SHOULD EXPECT: Some feelings of bloating in the abdomen. Passage of more gas than usual. Walking can help get rid of the air that was put into your GI tract during the procedure and reduce the bloating. If you had a lower endoscopy (such as a colonoscopy or flexible sigmoidoscopy) you may notice spotting of blood in your stool or on the toilet paper. Some abdominal soreness may be present for a day or two, also. ° °DIET: Your first meal following the procedure should be a light meal and then it is ok to progress to your normal diet. A half-sandwich or bowl of soup is an example of a good first meal. Heavy or fried foods are harder to digest and may make you feel nauseous or bloated. Drink plenty of fluids but you should avoid alcoholic beverages for 24 hours. If you had a esophageal dilation, please see attached instructions for diet.   ° °ACTIVITY: Your care partner should take you home directly after the procedure. You should plan to take it easy, moving slowly for the rest of the day. You can resume normal activity the day after the procedure however YOU SHOULD NOT DRIVE, use power tools, machinery or perform tasks that involve climbing or major physical exertion for 24 hours (because of the sedation medicines used during the test).  ° °SYMPTOMS TO REPORT IMMEDIATELY: °A gastroenterologist can be reached at any hour. Please call [phone]  for any of the following symptoms:  °Following lower endoscopy (colonoscopy, flexible sigmoidoscopy) °Excessive amounts of blood in the stool  °Significant tenderness, worsening of abdominal pains  °Swelling of the abdomen that is new, acute  °Fever of 100° or  higher  °Following upper endoscopy (EGD, EUS, ERCP, esophageal dilation) °Vomiting of blood or coffee ground material  °New, significant abdominal pain  °New, significant chest pain or pain under the shoulder blades  °Painful or persistently difficult swallowing  °New shortness of breath  °Black, tarry-looking or red, bloody stools ° °FOLLOW UP:  °If any biopsies were taken you will be contacted by phone or by letter within the next 1-3 weeks. Call [phone]  if you have not heard about the biopsies in 3 weeks.  °Please also call with any specific questions about appointments or follow up tests. ° °

## 2017-11-15 ENCOUNTER — Encounter (HOSPITAL_COMMUNITY): Payer: Self-pay | Admitting: Gastroenterology

## 2018-07-01 ENCOUNTER — Other Ambulatory Visit: Payer: Self-pay | Admitting: Internal Medicine

## 2018-07-01 DIAGNOSIS — R51 Headache: Principal | ICD-10-CM

## 2018-07-01 DIAGNOSIS — R519 Headache, unspecified: Secondary | ICD-10-CM

## 2018-07-03 ENCOUNTER — Ambulatory Visit (HOSPITAL_COMMUNITY)
Admission: RE | Admit: 2018-07-03 | Discharge: 2018-07-03 | Disposition: A | Discharge status: Home / Self Care | Payer: Medicare Other | Source: Ambulatory Visit | Attending: Internal Medicine | Admitting: Internal Medicine

## 2018-07-03 DIAGNOSIS — G4700 Insomnia, unspecified: Secondary | ICD-10-CM | POA: Insufficient documentation

## 2018-07-03 DIAGNOSIS — Z8673 Personal history of transient ischemic attack (TIA), and cerebral infarction without residual deficits: Secondary | ICD-10-CM | POA: Insufficient documentation

## 2018-07-03 DIAGNOSIS — D649 Anemia, unspecified: Secondary | ICD-10-CM | POA: Insufficient documentation

## 2018-07-03 DIAGNOSIS — I129 Hypertensive chronic kidney disease with stage 1 through stage 4 chronic kidney disease, or unspecified chronic kidney disease: Secondary | ICD-10-CM | POA: Diagnosis not present

## 2018-07-03 DIAGNOSIS — N184 Chronic kidney disease, stage 4 (severe): Secondary | ICD-10-CM | POA: Diagnosis not present

## 2018-07-03 DIAGNOSIS — E1122 Type 2 diabetes mellitus with diabetic chronic kidney disease: Secondary | ICD-10-CM | POA: Diagnosis not present

## 2018-07-03 DIAGNOSIS — R51 Headache: Secondary | ICD-10-CM | POA: Diagnosis present

## 2018-07-03 DIAGNOSIS — R519 Headache, unspecified: Secondary | ICD-10-CM

## 2018-12-23 ENCOUNTER — Other Ambulatory Visit: Payer: Self-pay | Admitting: Internal Medicine

## 2018-12-23 DIAGNOSIS — Z1231 Encounter for screening mammogram for malignant neoplasm of breast: Secondary | ICD-10-CM

## 2019-01-20 ENCOUNTER — Ambulatory Visit: Payer: Medicare Other

## 2019-02-18 ENCOUNTER — Ambulatory Visit: Payer: Medicare Other

## 2020-01-14 ENCOUNTER — Other Ambulatory Visit (HOSPITAL_COMMUNITY): Payer: Self-pay | Admitting: Internal Medicine

## 2020-12-07 ENCOUNTER — Other Ambulatory Visit: Payer: Self-pay

## 2020-12-07 ENCOUNTER — Emergency Department (HOSPITAL_COMMUNITY): Payer: Medicare HMO

## 2020-12-07 ENCOUNTER — Emergency Department (HOSPITAL_COMMUNITY)
Admission: EM | Admit: 2020-12-07 | Discharge: 2020-12-08 | Disposition: A | Discharge status: Home / Self Care | Payer: Medicare HMO | Attending: Emergency Medicine | Admitting: Emergency Medicine

## 2020-12-07 ENCOUNTER — Encounter (HOSPITAL_COMMUNITY): Payer: Self-pay

## 2020-12-07 DIAGNOSIS — I6782 Cerebral ischemia: Secondary | ICD-10-CM | POA: Insufficient documentation

## 2020-12-07 DIAGNOSIS — N183 Chronic kidney disease, stage 3 unspecified: Secondary | ICD-10-CM | POA: Insufficient documentation

## 2020-12-07 DIAGNOSIS — I517 Cardiomegaly: Secondary | ICD-10-CM | POA: Insufficient documentation

## 2020-12-07 DIAGNOSIS — I129 Hypertensive chronic kidney disease with stage 1 through stage 4 chronic kidney disease, or unspecified chronic kidney disease: Secondary | ICD-10-CM | POA: Diagnosis not present

## 2020-12-07 DIAGNOSIS — K6389 Other specified diseases of intestine: Secondary | ICD-10-CM | POA: Diagnosis not present

## 2020-12-07 DIAGNOSIS — Z20822 Contact with and (suspected) exposure to covid-19: Secondary | ICD-10-CM | POA: Diagnosis not present

## 2020-12-07 DIAGNOSIS — Z7984 Long term (current) use of oral hypoglycemic drugs: Secondary | ICD-10-CM | POA: Insufficient documentation

## 2020-12-07 DIAGNOSIS — N949 Unspecified condition associated with female genital organs and menstrual cycle: Secondary | ICD-10-CM

## 2020-12-07 DIAGNOSIS — E1122 Type 2 diabetes mellitus with diabetic chronic kidney disease: Secondary | ICD-10-CM | POA: Insufficient documentation

## 2020-12-07 DIAGNOSIS — Z87891 Personal history of nicotine dependence: Secondary | ICD-10-CM | POA: Insufficient documentation

## 2020-12-07 DIAGNOSIS — R410 Disorientation, unspecified: Secondary | ICD-10-CM | POA: Diagnosis present

## 2020-12-07 DIAGNOSIS — E039 Hypothyroidism, unspecified: Secondary | ICD-10-CM | POA: Insufficient documentation

## 2020-12-07 DIAGNOSIS — E1143 Type 2 diabetes mellitus with diabetic autonomic (poly)neuropathy: Secondary | ICD-10-CM | POA: Insufficient documentation

## 2020-12-07 DIAGNOSIS — Z79899 Other long term (current) drug therapy: Secondary | ICD-10-CM | POA: Insufficient documentation

## 2020-12-07 DIAGNOSIS — N390 Urinary tract infection, site not specified: Secondary | ICD-10-CM

## 2020-12-07 DIAGNOSIS — E860 Dehydration: Secondary | ICD-10-CM

## 2020-12-07 LAB — COMPREHENSIVE METABOLIC PANEL
ALT: 160 U/L — ABNORMAL HIGH (ref 0–44)
AST: 137 U/L — ABNORMAL HIGH (ref 15–41)
Albumin: 3.6 g/dL (ref 3.5–5.0)
Alkaline Phosphatase: 152 U/L — ABNORMAL HIGH (ref 38–126)
Anion gap: 15 (ref 5–15)
BUN: 96 mg/dL — ABNORMAL HIGH (ref 8–23)
CO2: 15 mmol/L — ABNORMAL LOW (ref 22–32)
Calcium: 10.3 mg/dL (ref 8.9–10.3)
Chloride: 108 mmol/L (ref 98–111)
Creatinine, Ser: 2.76 mg/dL — ABNORMAL HIGH (ref 0.44–1.00)
GFR, Estimated: 19 mL/min — ABNORMAL LOW (ref >60)
Glucose, Bld: 104 mg/dL — ABNORMAL HIGH (ref 70–99)
Potassium: 5.1 mmol/L (ref 3.5–5.1)
Sodium: 138 mmol/L (ref 135–145)
Total Bilirubin: 0.5 mg/dL (ref 0.3–1.2)
Total Protein: 8.7 g/dL — ABNORMAL HIGH (ref 6.5–8.1)

## 2020-12-07 LAB — URINALYSIS, ROUTINE W REFLEX MICROSCOPIC
Bilirubin Urine: NEGATIVE
Glucose, UA: NEGATIVE mg/dL
Ketones, ur: NEGATIVE mg/dL
Nitrite: NEGATIVE
Protein, ur: 30 mg/dL — AB
Specific Gravity, Urine: 1.014 (ref 1.005–1.030)
pH: 5 (ref 5.0–8.0)

## 2020-12-07 LAB — CBC
HCT: 38.4 % (ref 36.0–46.0)
Hemoglobin: 12.1 g/dL (ref 12.0–15.0)
MCH: 30.8 pg (ref 26.0–34.0)
MCHC: 31.5 g/dL (ref 30.0–36.0)
MCV: 97.7 fL (ref 80.0–100.0)
Platelets: 255 K/uL (ref 150–400)
RBC: 3.93 MIL/uL (ref 3.87–5.11)
RDW: 16.6 % — ABNORMAL HIGH (ref 11.5–15.5)
WBC: 7.8 K/uL (ref 4.0–10.5)
nRBC: 0.4 % — ABNORMAL HIGH (ref 0.0–0.2)

## 2020-12-07 LAB — RESP PANEL BY RT-PCR (RSV, FLU A&B, COVID)  RVPGX2
Influenza A by PCR: NEGATIVE
Influenza B by PCR: NEGATIVE
Resp Syncytial Virus by PCR: NEGATIVE
SARS Coronavirus 2 by RT PCR: NEGATIVE

## 2020-12-07 MED ORDER — CEPHALEXIN 250 MG PO CAPS
500.0000 mg | ORAL_CAPSULE | Freq: Once | ORAL | Status: AC
  Administered 2020-12-07: 500 mg via ORAL
  Filled 2020-12-07: qty 2

## 2020-12-07 MED ORDER — SODIUM CHLORIDE 0.9 % IV BOLUS
1000.0000 mL | Freq: Once | INTRAVENOUS | Status: AC
  Administered 2020-12-07: 1000 mL via INTRAVENOUS

## 2020-12-07 MED ORDER — CEPHALEXIN 250 MG PO CAPS: 250.0000 mg | ORAL_CAPSULE | Freq: Three times a day (TID) | ORAL | 0 refills | Status: DC

## 2020-12-07 NOTE — Discharge Instructions (Addendum)
You were seen in the emergency department for change in mental status.  You had lab work urinalysis CAT scan of your head chest x-ray.  You have a possible urinary tract infection and we are placing you on antibiotics.  You also had some signs of dehydration and will need to increase your fluid intake.  On your CAT scan they also showed an adnexal cyst and this will need a follow-up ultrasound that your primary doctor can order.  Please return to the emergency department if any worsening or concerning symptoms.

## 2020-12-07 NOTE — ED Notes (Signed)
[facility] notified of transport, report called to [facility]

## 2020-12-07 NOTE — ED Triage Notes (Signed)
Pt from [facility] with [facility] for intermittent AMS per staff. Staff reports foul urine odor, wants her to be checked for a UTI. Pt is bedbound. Pt alert and oriented to person place and year in triage. C.o feeling tired. vss

## 2020-12-07 NOTE — ED Provider Notes (Signed)
[facility] EMERGENCY DEPARTMENT Provider Note   CSN: [id number] Arrival date & time: 12/07/20  1049     History Chief Complaint  Patient presents with  . Altered Mental Status    Lauren Barber is a 65 y.o. female.  65 yo F with a chief complaints of delirium.  It sounds like the patient has been altered off and on for the past few days.  Is bedbound chronically and is in a nursing facility after a stroke.  She denies any specific symptoms except for she felt like her urine smelled different.  On further clarification it sounds like she has been having difficulty with her sense of smell in general.  She denies any cough or congestion denies chest pain or pressure denies abdominal pain nausea vomiting or diarrhea.  She has some old wounds to her sacrum that she thinks are much better than baseline.  She denies any other wounds or sores.  She denies any fevers or chills.  The history is provided by the patient and the EMS personnel.  Altered Mental Status Presenting symptoms: confusion and disorientation   Severity:  Moderate Most recent episode:  2 days ago Episode history:  Multiple Duration:  2 days Timing:  Intermittent Progression:  Waxing and waning Chronicity:  New Context: nursing home resident   Associated symptoms: no fever, no headaches, no nausea, no palpitations and no vomiting        Past Medical History:  Diagnosis Date  . Anemia   . Anxiety   . Atopic conjunctivitis   . Cardiomegaly   . Cellulitis    legs  . Cellulitis of right thigh 08/13/2016  . Chronic kidney disease    stage 3 per nursing home chart  . CVA (cerebral vascular accident) ([place])   . Degenerative, intervertebral disc    back, legs  . Depressive disorder    depressive disorder  . Diabetes mellitus without complication ([place])   . Endometriosis   . Essential hypertension 04/12/2007   Qualifier: Diagnosis of  By: Jobe Igo MD, Shanon Brow    . Gout    feet  . Headache(784.0)     otc meds prn  . History of blood transfusion [place] - unsure number of units transfused  . Hypertension   . Hypothyroidism   . Insomnia   . Neuromuscular disorder ([place])    diabetic neuropathy - feet  . Obesities, morbid ([place])   . Stroke [facility]) 2013  . Vaginal bleeding     Patient Active Problem List   Diagnosis Date Noted  . Abnormal CT scan   . Chronic abdominal pain 09/26/2017  . Acute panniculitis 09/26/2017  . Mass of sigmoid colon by CT scan 09/26/2017  . Diastasis recti (NO HERNIA) 09/26/2017  . Leukocytosis 09/26/2017  . 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 Bedridden 09/26/2017  . Depressive disorder   . Ingrown nail of great toe of right foot 10/24/2016  . Bilateral lower extremity edema 06/11/2016  . 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 Dyslipidemia associated with type 2 diabetes mellitus ([place]) 05/05/2016  . Hypothyroidism 06/06/2015  . Chronic kidney disease (CKD), stage III (moderate) ([place]) 12/24/2014  . Insomnia 09/09/2014  . Slow transit constipation 09/09/2014  . Peripheral autonomic neuropathy due to DM ([place]) 12/02/2013  . Constipation 10/24/2013  . Gout 07/03/2013  . Anemia 02/18/2013  . Chronic pain 02/18/2013  . Depression 02/18/2013  . Obesity, Class III, BMI 40-49.9 (morbid obesity) ([place])  04/12/2007  . Essential hypertension 04/12/2007  . DEGENERATION, DISC NOS 04/12/2007    Past Surgical History:  Procedure Laterality Date  . BIOPSY ENDOMETRIAL N/A 10 03 2013  . [place]   x 2  . COLONOSCOPY WITH PROPOFOL N/A 11/14/2017   Procedure: COLONOSCOPY WITH PROPOFOL;  Surgeon: Milus Banister, MD;  Location: [facility];  Service: Endoscopy;  Laterality: N/A;  . DILATION AND CURETTAGE OF UTERUS  2013  . HYSTEROSCOPY WITH D & C N/A 06/28/2014   Procedure: DILATATION AND CURETTAGE /HYSTEROSCOPY;  Surgeon: Lavonia Drafts, MD;  Location: [facility];  Service:  Gynecology;  Laterality: N/A;  . INCISIONAL HERNIA REPAIR N/A 09/26/2017   Procedure: DIAGNOSTIC LAPAROSCOPY;  Surgeon: Michael Boston, MD;  Location: [facility];  Service: General;  Laterality: N/A;  . TONSILLECTOMY    . WISDOM TOOTH EXTRACTION       OB History    Gravida  2   Para  2   Term  2   Preterm  0   AB  0   Living  2     SAB  0   IAB  0   Ectopic  0   Multiple  0   Live Births              Family History  Problem Relation Age of Onset  . Hypertension Mother   . Hypertension Father     Social History   Tobacco Use  . Smoking status: Former Smoker    Packs/day: 1.00    Years: 43.00    Pack years: 43.00    Types: Cigarettes    Quit date: 06/11/2012    Years since quitting: 8.4  . Smokeless tobacco: Never Used  Vaping Use  . Vaping Use: Never used  Substance Use Topics  . Alcohol use: No  . Drug use: No    Home Medications Prior to Admission medications   Medication Sig Start Date End Date Taking? Authorizing Provider  acetaminophen (TYLENOL) 325 MG tablet Take 650 mg every 6 (six) hours as needed by mouth for headache.     [provider]  allopurinol (ZYLOPRIM) 100 MG tablet Take 200 mg by mouth daily.    [provider]  amitriptyline (ELAVIL) 100 MG tablet Take 100 mg by mouth at bedtime.    [provider]  bisacodyl (DULCOLAX) 10 MG suppository Place 1 suppository (10 mg total) daily rectally. 09/28/17   Patrecia Pour, MD  Carboxymethylcellulose Sodium (REFRESH TEARS OP) Place 1 drop into both eyes every 4 (four) hours as needed (for dry eyes).    [provider]  colchicine 0.6 MG tablet Take 0.6 mg by mouth daily.    [provider]  Diclofenac Sodium 1.6 % GEL Place 2 g onto the skin 3 (three) times daily.    [provider]  diphenhydrAMINE (BENADRYL) 25 MG tablet Take 25 mg every 8 (eight) hours as needed by mouth for allergies.     [provider]  EPINEPHrine (EPIPEN) 0.3  mg/0.3 mL DEVI Inject 0.3 mg daily as needed into the muscle (allergic reaction).     [provider]  fenofibrate (TRICOR) 48 MG tablet Take 48 mg by mouth at bedtime.    [provider]  FLUoxetine (PROZAC) 20 MG capsule Take 60 mg daily by mouth.    [provider]  gabapentin (NEURONTIN) 600 MG tablet Take 600 mg by mouth 3 (three) times daily.  [provider]  HYDROcodone-acetaminophen (NORCO/VICODIN) 5-325 MG tablet Take 1 tablet daily as needed by mouth for moderate pain. Patient taking differently: Take 1 tablet by mouth See admin instructions. Take 1 tablet by mouth every 12 hours. Take 1 tablet by mouth daily as needed for pain 09/27/17   Patrecia Pour, MD  hydrocortisone cream 1 % Apply 1 application 2 (two) times daily as needed topically (for irritated area).     [provider]  levothyroxine (SYNTHROID, LEVOTHROID) 50 MCG tablet Take 1 tablet (50 mcg total) by mouth daily. 11/18/15   Gerlene Fee, NP  linagliptin (TRADJENTA) 5 MG TABS tablet Take 5 mg by mouth daily.    [provider]  Melatonin 3 MG TABS Take 3 mg by mouth at bedtime.    [provider]  Menthol, Topical Analgesic, (BIOFREEZE ROLL-ON EX) Apply 1 application 4 (four) times daily topically. Apply to joints four times a day for joint pain.    [provider]  metoCLOPramide (REGLAN) 10 MG tablet Take 1 tablet (10 mg total) by mouth as directed. 10/24/17   Levin Erp, PA  Na Sulfate-K Sulfate-Mg Sulf (SUPREP BOWEL PREP KIT) 17.5-3.13-1.6 GM/177ML SOLN Take 1 kit by mouth as directed. 10/24/17   Levin Erp, PA  pantoprazole (PROTONIX) 40 MG tablet Take 1 tablet (40 mg total) by mouth daily. 12/19/14   Rai, Ripudeep K, MD  polyethylene glycol (MIRALAX / GLYCOLAX) packet Take 34 g 3 (three) times daily by mouth. 09/27/17   Patrecia Pour, MD  polyvinyl alcohol (LIQUIFILM TEARS) 1.4 % ophthalmic solution Place 1 drop into both  eyes every 4 (four) hours as needed for dry eyes.    [provider]  psyllium (HYDROCIL/METAMUCIL) 95 % PACK Take 1 packet 2 (two) times daily by mouth. 09/27/17   Patrecia Pour, MD  sennosides-docusate sodium (SENOKOT-S) 8.6-50 MG tablet Take 2 tablets 2 (two) times daily by mouth. 09/27/17   Patrecia Pour, MD  sodium chloride (OCEAN) 0.65 % SOLN nasal spray Place 1 spray into both nostrils as needed for congestion.    [provider]  spironolactone (ALDACTONE) 25 MG tablet Take 25 mg by mouth daily.    [provider]  tiZANidine (ZANAFLEX) 2 MG tablet Take 2 mg 4 (four) times daily by mouth.    [provider]  torsemide (DEMADEX) 10 MG tablet Take 10 mg by mouth daily.     [provider]  Vitamin D, Ergocalciferol, (DRISDOL) 50000 units CAPS capsule Take 50,000 Units by mouth every Wednesday.     [provider]    Allergies    Nsaids and Shellfish allergy  Review of Systems   Review of Systems  Constitutional: Negative for chills and fever.  HENT: Negative for congestion and rhinorrhea.        Change in sense of smell  Eyes: Negative for redness and visual disturbance.  Respiratory: Negative for shortness of breath and wheezing.   Cardiovascular: Negative for chest pain and palpitations.  Gastrointestinal: Negative for nausea and vomiting.  Genitourinary: Negative for dysuria and urgency.  Musculoskeletal: Negative for arthralgias and myalgias.  Skin: Negative for pallor and wound.  Neurological: Negative for dizziness and headaches.  Psychiatric/Behavioral: Positive for confusion.    Physical Exam Updated Vital Signs BP 124/83   Pulse (!) 107   Temp (!) 97.4 F (36.3 C) (Oral)   Resp 12   Ht _0  (1.753 m)   Wt 134.3 kg  SpO2 100%   BMI 43.71 kg/m   Physical Exam Vitals and nursing note reviewed.  Constitutional:      General: She is not in acute distress.    Appearance: She is well-developed and  well-nourished. She is obese. She is not diaphoretic.  HENT:     Head: Normocephalic and atraumatic.  Eyes:     Extraocular Movements: EOM normal.     Pupils: Pupils are equal, round, and reactive to light.  Cardiovascular:     Rate and Rhythm: Normal rate and regular rhythm.     Heart sounds: No murmur heard. No friction rub. No gallop.   Pulmonary:     Effort: Pulmonary effort is normal.     Breath sounds: No wheezing or rales.  Abdominal:     General: There is no distension.     Palpations: Abdomen is soft.     Tenderness: There is no abdominal tenderness.  Musculoskeletal:        General: No tenderness or edema.     Cervical back: Normal range of motion and neck supple.  Skin:    General: Skin is warm and dry.  Neurological:     Mental Status: She is alert and oriented to person, place, and time.  Psychiatric:        Mood and Affect: Mood and affect normal.        Behavior: Behavior normal.     ED Results / Procedures / Treatments   Labs (all labs ordered are listed, but only abnormal results are displayed) Labs Reviewed  COMPREHENSIVE METABOLIC PANEL - Abnormal; Notable for the following components:      Result Value   CO2 15 (*)    Glucose, Bld 104 (*)    BUN 96 (*)    Creatinine, Ser 2.76 (*)    Total Protein 8.7 (*)    AST 137 (*)    ALT 160 (*)    Alkaline Phosphatase 152 (*)    GFR, Estimated 19 (*)    All other components within normal limits  CBC - Abnormal; Notable for the following components:   RDW 16.6 (*)    nRBC 0.4 (*)    All other components within normal limits  RESP PANEL BY RT-PCR (RSV, FLU A&B, COVID)  RVPGX2  URINALYSIS, ROUTINE W REFLEX MICROSCOPIC  CBG MONITORING, ED    EKG EKG Interpretation  Date/Time:  Wednesday December 07 2020 11:00:40 EST Ventricular Rate:  108 PR Interval:  180 QRS Duration: 122 QT Interval:  348 QTC Calculation: 466 R Axis:   -32 Text Interpretation: Sinus tachycardia Left axis deviation Non-specific  intra-ventricular conduction delay Minimal voltage criteria for LVH, may be normal variant ( Cornell product ) Nonspecific T wave abnormality Abnormal ECG No significant change since last tracing Confirmed by Deno Etienne [phone]) on 12/07/2020 12:22:08 PM   Radiology CT Head Wo Contrast  Result Date: 12/07/2020 CLINICAL DATA:  Delirium.  Intermittent altered mental status. EXAM: CT HEAD WITHOUT CONTRAST TECHNIQUE: Contiguous axial images were obtained from the base of the skull through the vertex without intravenous contrast. COMPARISON:  Prior head CT 07/03/2018. FINDINGS: Brain: Mild cerebral atrophy. Redemonstrated chronic right basal ganglia lacunar infarcts. Moderate ill-defined hypoattenuation within the cerebral white matter is nonspecific, but compatible with chronic small vessel ischemic disease. Redemonstrated small chronic infarct within the right cerebellar hemisphere. There is no acute intracranial hemorrhage. No demarcated cortical infarct. No extra-axial fluid collection. No evidence of intracranial mass. No midline shift. Vascular: No hyperdense vessel.  Atherosclerotic calcifications Skull: Normal. Negative for fracture or focal lesion. Sinuses/Orbits: Visualized orbits show no acute finding. Partial opacification of posterior left ethmoid air cells. IMPRESSION: No evidence of acute intracranial abnormality. Redemonstrated chronic infarcts within the right basal ganglia and right cerebellum. Stable background mild cerebral atrophy and moderate chronic small vessel ischemic disease. Left ethmoid sinusitis. Electronically Signed   By: Kellie Simmering DO   On: 12/07/2020 13:33   DG Chest Port 1 View  Result Date: 12/07/2020 CLINICAL DATA:  Delirium EXAM: PORTABLE CHEST 1 VIEW COMPARISON:  06/01/2015 chest radiograph. FINDINGS: Stable cardiomediastinal silhouette with mild cardiomegaly. No pneumothorax. No pleural effusion. No pulmonary edema. Hazy left retrocardiac opacity. IMPRESSION: 1. Stable  mild cardiomegaly without pulmonary edema. 2. Hazy left retrocardiac opacity, favor atelectasis, difficult to exclude a component of pneumonia. Electronically Signed   By: Ilona Sorrel M.D.   On: 12/07/2020 13:23    Procedures Procedures (including critical care time)  Medications Ordered in ED Medications  sodium chloride 0.9 % bolus 1,000 mL (has no administration in time range)    ED Course  I have reviewed the triage vital signs and the nursing notes.  Pertinent labs & imaging results that were available during my care of the patient were reviewed by me and considered in my medical decision making (see chart for details).    MDM Rules/Calculators/A&P                          65 yo F with what sounds like delirium fluctuating mental status for the past few days.  Patient appears clinically dehydrated we will give a bolus of IV fluids.  With her change in sense of smell we will do a coronavirus test.  Chest x-ray UA CT of the head.  She has mild elevation in her LFTs from her baseline.  No abdominal tenderness on my exam.  Covid negative, will CT, ua.   Signed out to Dr. Melina Copa, please see their note for further details of care in the ED.   The patients results and plan were reviewed and discussed.   Any x-rays performed were independently reviewed by myself.   Differential diagnosis were considered with the presenting HPI.  Medications  sodium chloride 0.9 % bolus 1,000 mL (has no administration in time range)    Vitals:   12/07/20 1055 12/07/20 1234 12/07/20 1330  BP: 113/73  124/83  Pulse: (!) 108  (!) 107  Resp: 18  12  Temp: (!) 97.4 F (36.3 C)    TempSrc: Oral    SpO2: 98%  100%  Weight:  134.3 kg   Height:  _0  (1.753 m)     Final diagnoses:  Delirium    Admission/ observation were discussed with the admitting physician, patient and/or family and they are comfortable with the plan.   Final Clinical Impression(s) / ED Diagnoses Final diagnoses:   Delirium    Rx / DC Orders ED Discharge Orders    None       Deno Etienne, DO 12/07/20 1521

## 2020-12-07 NOTE — ED Provider Notes (Signed)
Signout from Dr. Tyrone Nine.  Patient is from a facility and is bedbound.  Staff sent her in for being altered for the past few days.  Concern for possible UTI.  Getting labs and hydration.  Anticipate discharge back to facility if no acute findings and work-up. Physical Exam  BP 115/85   Pulse (!) 109   Temp (!) 97.4 F (36.3 C) (Oral)   Resp 13   Ht 5\' 9"  (1.753 m)   Wt 134.3 kg   SpO2 99%   BMI 43.71 kg/m   Physical Exam  ED Course/Procedures     Procedures  MDM  After fluids patient is now awake and alert.  Tachycardia improving.  She states she would like to go home.  Still waiting on urine.  Analysis with possible signs of infection.  Ordered culture.  Will cover with Keflex.  Patient awaiting transport back to her facility.       Lauren Rasmussen, MD 12/08/20 1149

## 2020-12-07 NOTE — ED Notes (Signed)
Pt states she has not had any appetite for 1 week, tongue and mucus membranes dry- denies any nausea- states she still has "a little smell"

## 2020-12-07 NOTE — ED Notes (Signed)
Pt is awake, states she is ready to go home, is from [place]- will need [facility] to transport back

## 2020-12-07 NOTE — ED Notes (Signed)
Called [facility] for patient to go back to [facility]. Patient has 11 people ahead of her.

## 2020-12-09 LAB — URINE CULTURE

## 2020-12-11 ENCOUNTER — Encounter (HOSPITAL_COMMUNITY): Payer: Self-pay

## 2020-12-11 ENCOUNTER — Observation Stay (HOSPITAL_COMMUNITY): Payer: Medicare HMO

## 2020-12-11 ENCOUNTER — Inpatient Hospital Stay (HOSPITAL_COMMUNITY)
Admission: EM | Admit: 2020-12-11 | Discharge: 2020-12-20 | DRG: 871 | Disposition: A | Discharge status: Hospice | Payer: Medicare HMO | Attending: Family Medicine | Admitting: Family Medicine

## 2020-12-11 ENCOUNTER — Other Ambulatory Visit: Payer: Self-pay

## 2020-12-11 ENCOUNTER — Emergency Department (HOSPITAL_COMMUNITY): Payer: Medicare HMO

## 2020-12-11 ENCOUNTER — Other Ambulatory Visit (HOSPITAL_COMMUNITY): Payer: Medicare HMO

## 2020-12-11 DIAGNOSIS — E1169 Type 2 diabetes mellitus with other specified complication: Secondary | ICD-10-CM | POA: Diagnosis present

## 2020-12-11 DIAGNOSIS — E875 Hyperkalemia: Secondary | ICD-10-CM | POA: Diagnosis present

## 2020-12-11 DIAGNOSIS — I6930 Unspecified sequelae of cerebral infarction: Secondary | ICD-10-CM

## 2020-12-11 DIAGNOSIS — R6521 Severe sepsis with septic shock: Secondary | ICD-10-CM | POA: Diagnosis present

## 2020-12-11 DIAGNOSIS — I358 Other nonrheumatic aortic valve disorders: Secondary | ICD-10-CM | POA: Diagnosis present

## 2020-12-11 DIAGNOSIS — Z8249 Family history of ischemic heart disease and other diseases of the circulatory system: Secondary | ICD-10-CM

## 2020-12-11 DIAGNOSIS — Z20822 Contact with and (suspected) exposure to covid-19: Secondary | ICD-10-CM | POA: Diagnosis present

## 2020-12-11 DIAGNOSIS — R4182 Altered mental status, unspecified: Secondary | ICD-10-CM

## 2020-12-11 DIAGNOSIS — Z1621 Resistance to vancomycin: Secondary | ICD-10-CM | POA: Diagnosis present

## 2020-12-11 DIAGNOSIS — B952 Enterococcus as the cause of diseases classified elsewhere: Secondary | ICD-10-CM | POA: Diagnosis present

## 2020-12-11 DIAGNOSIS — E870 Hyperosmolality and hypernatremia: Secondary | ICD-10-CM | POA: Diagnosis present

## 2020-12-11 DIAGNOSIS — N390 Urinary tract infection, site not specified: Secondary | ICD-10-CM

## 2020-12-11 DIAGNOSIS — E039 Hypothyroidism, unspecified: Secondary | ICD-10-CM | POA: Diagnosis present

## 2020-12-11 DIAGNOSIS — M1A9XX0 Chronic gout, unspecified, without tophus (tophi): Secondary | ICD-10-CM | POA: Diagnosis present

## 2020-12-11 DIAGNOSIS — N1832 Chronic kidney disease, stage 3b: Secondary | ICD-10-CM | POA: Diagnosis present

## 2020-12-11 DIAGNOSIS — E1143 Type 2 diabetes mellitus with diabetic autonomic (poly)neuropathy: Secondary | ICD-10-CM | POA: Diagnosis present

## 2020-12-11 DIAGNOSIS — Z87891 Personal history of nicotine dependence: Secondary | ICD-10-CM

## 2020-12-11 DIAGNOSIS — Z79899 Other long term (current) drug therapy: Secondary | ICD-10-CM

## 2020-12-11 DIAGNOSIS — K7290 Hepatic failure, unspecified without coma: Secondary | ICD-10-CM | POA: Diagnosis present

## 2020-12-11 DIAGNOSIS — M25511 Pain in right shoulder: Secondary | ICD-10-CM | POA: Diagnosis present

## 2020-12-11 DIAGNOSIS — F32A Depression, unspecified: Secondary | ICD-10-CM | POA: Diagnosis present

## 2020-12-11 DIAGNOSIS — G9341 Metabolic encephalopathy: Secondary | ICD-10-CM | POA: Diagnosis present

## 2020-12-11 DIAGNOSIS — D689 Coagulation defect, unspecified: Secondary | ICD-10-CM | POA: Diagnosis present

## 2020-12-11 DIAGNOSIS — E876 Hypokalemia: Secondary | ICD-10-CM | POA: Diagnosis present

## 2020-12-11 DIAGNOSIS — B965 Pseudomonas (aeruginosa) (mallei) (pseudomallei) as the cause of diseases classified elsewhere: Secondary | ICD-10-CM | POA: Diagnosis present

## 2020-12-11 DIAGNOSIS — Z7982 Long term (current) use of aspirin: Secondary | ICD-10-CM

## 2020-12-11 DIAGNOSIS — E43 Unspecified severe protein-calorie malnutrition: Secondary | ICD-10-CM | POA: Diagnosis present

## 2020-12-11 DIAGNOSIS — A419 Sepsis, unspecified organism: Principal | ICD-10-CM

## 2020-12-11 DIAGNOSIS — E861 Hypovolemia: Secondary | ICD-10-CM | POA: Diagnosis present

## 2020-12-11 DIAGNOSIS — D649 Anemia, unspecified: Secondary | ICD-10-CM | POA: Diagnosis present

## 2020-12-11 DIAGNOSIS — Z6841 Body Mass Index (BMI) 40.0 and over, adult: Secondary | ICD-10-CM

## 2020-12-11 DIAGNOSIS — Z515 Encounter for palliative care: Secondary | ICD-10-CM

## 2020-12-11 DIAGNOSIS — Z7984 Long term (current) use of oral hypoglycemic drugs: Secondary | ICD-10-CM

## 2020-12-11 DIAGNOSIS — R319 Hematuria, unspecified: Secondary | ICD-10-CM

## 2020-12-11 DIAGNOSIS — N170 Acute kidney failure with tubular necrosis: Secondary | ICD-10-CM | POA: Diagnosis present

## 2020-12-11 DIAGNOSIS — Z79890 Hormone replacement therapy (postmenopausal): Secondary | ICD-10-CM

## 2020-12-11 DIAGNOSIS — E1122 Type 2 diabetes mellitus with diabetic chronic kidney disease: Secondary | ICD-10-CM | POA: Diagnosis present

## 2020-12-11 DIAGNOSIS — I129 Hypertensive chronic kidney disease with stage 1 through stage 4 chronic kidney disease, or unspecified chronic kidney disease: Secondary | ICD-10-CM | POA: Diagnosis present

## 2020-12-11 DIAGNOSIS — Z66 Do not resuscitate: Secondary | ICD-10-CM | POA: Diagnosis present

## 2020-12-11 DIAGNOSIS — M6282 Rhabdomyolysis: Secondary | ICD-10-CM | POA: Diagnosis present

## 2020-12-11 DIAGNOSIS — E860 Dehydration: Secondary | ICD-10-CM | POA: Diagnosis present

## 2020-12-11 DIAGNOSIS — L89159 Pressure ulcer of sacral region, unspecified stage: Secondary | ICD-10-CM | POA: Diagnosis present

## 2020-12-11 DIAGNOSIS — K219 Gastro-esophageal reflux disease without esophagitis: Secondary | ICD-10-CM | POA: Diagnosis present

## 2020-12-11 DIAGNOSIS — E785 Hyperlipidemia, unspecified: Secondary | ICD-10-CM | POA: Diagnosis present

## 2020-12-11 DIAGNOSIS — N179 Acute kidney failure, unspecified: Secondary | ICD-10-CM | POA: Diagnosis present

## 2020-12-11 DIAGNOSIS — Z7401 Bed confinement status: Secondary | ICD-10-CM

## 2020-12-11 DIAGNOSIS — L98429 Non-pressure chronic ulcer of back with unspecified severity: Secondary | ICD-10-CM | POA: Diagnosis present

## 2020-12-11 LAB — RENAL FUNCTION PANEL
Albumin: 2.8 g/dL — ABNORMAL LOW (ref 3.5–5.0)
Anion gap: 15 (ref 5–15)
BUN: 155 mg/dL — ABNORMAL HIGH (ref 8–23)
CO2: 13 mmol/L — ABNORMAL LOW (ref 22–32)
Calcium: 9.2 mg/dL (ref 8.9–10.3)
Chloride: 113 mmol/L — ABNORMAL HIGH (ref 98–111)
Creatinine, Ser: 4.58 mg/dL — ABNORMAL HIGH (ref 0.44–1.00)
GFR, Estimated: 10 mL/min — ABNORMAL LOW (ref >60)
Glucose, Bld: 96 mg/dL (ref 70–99)
Phosphorus: 6.5 mg/dL — ABNORMAL HIGH (ref 2.5–4.6)
Potassium: 5.5 mmol/L — ABNORMAL HIGH (ref 3.5–5.1)
Sodium: 141 mmol/L (ref 135–145)

## 2020-12-11 LAB — CBC WITH DIFFERENTIAL/PLATELET
Abs Immature Granulocytes: 0.07 10*3/uL (ref 0.00–0.07)
Basophils Absolute: 0 10*3/uL (ref 0.0–0.1)
Basophils Relative: 0 %
Eosinophils Absolute: 0.3 10*3/uL (ref 0.0–0.5)
Eosinophils Relative: 3 %
HCT: 39.2 % (ref 36.0–46.0)
Hemoglobin: 12.1 g/dL (ref 12.0–15.0)
Immature Granulocytes: 1 %
Lymphocytes Relative: 24 %
Lymphs Abs: 1.9 10*3/uL (ref 0.7–4.0)
MCH: 29.9 pg (ref 26.0–34.0)
MCHC: 30.9 g/dL (ref 30.0–36.0)
MCV: 96.8 fL (ref 80.0–100.0)
Monocytes Absolute: 0.7 10*3/uL (ref 0.1–1.0)
Monocytes Relative: 9 %
Neutro Abs: 4.9 10*3/uL (ref 1.7–7.7)
Neutrophils Relative %: 63 %
Platelets: 233 10*3/uL (ref 150–400)
RBC: 4.05 MIL/uL (ref 3.87–5.11)
RDW: 16.8 % — ABNORMAL HIGH (ref 11.5–15.5)
WBC: 7.7 10*3/uL (ref 4.0–10.5)
nRBC: 0.8 % — ABNORMAL HIGH (ref 0.0–0.2)

## 2020-12-11 LAB — URINALYSIS, ROUTINE W REFLEX MICROSCOPIC
Bilirubin Urine: NEGATIVE
Glucose, UA: NEGATIVE mg/dL
Ketones, ur: 5 mg/dL — AB
Nitrite: NEGATIVE
Protein, ur: 100 mg/dL — AB
Specific Gravity, Urine: 1.017 (ref 1.005–1.030)
WBC, UA: >50 WBC/hpf — ABNORMAL HIGH (ref 0–5)
pH: 6 (ref 5.0–8.0)

## 2020-12-11 LAB — CBG MONITORING, ED
Glucose-Capillary: 108 mg/dL — ABNORMAL HIGH (ref 70–99)
Glucose-Capillary: 134 mg/dL — ABNORMAL HIGH (ref 70–99)

## 2020-12-11 LAB — COMPREHENSIVE METABOLIC PANEL
ALT: 266 U/L — ABNORMAL HIGH (ref 0–44)
ALT: 274 U/L — ABNORMAL HIGH (ref 0–44)
AST: 193 U/L — ABNORMAL HIGH (ref 15–41)
AST: 192 U/L — ABNORMAL HIGH (ref 15–41)
Albumin: 3.4 g/dL — ABNORMAL LOW (ref 3.5–5.0)
Albumin: 3.5 g/dL (ref 3.5–5.0)
Alkaline Phosphatase: 172 U/L — ABNORMAL HIGH (ref 38–126)
Alkaline Phosphatase: 174 U/L — ABNORMAL HIGH (ref 38–126)
Anion gap: 18 — ABNORMAL HIGH (ref 5–15)
Anion gap: 18 — ABNORMAL HIGH (ref 5–15)
BUN: 161 mg/dL — ABNORMAL HIGH (ref 8–23)
BUN: 159 mg/dL — ABNORMAL HIGH (ref 8–23)
CO2: 12 mmol/L — ABNORMAL LOW (ref 22–32)
CO2: 14 mmol/L — ABNORMAL LOW (ref 22–32)
Calcium: 10.3 mg/dL (ref 8.9–10.3)
Calcium: 10.4 mg/dL — ABNORMAL HIGH (ref 8.9–10.3)
Chloride: 110 mmol/L (ref 98–111)
Chloride: 109 mmol/L (ref 98–111)
Creatinine, Ser: 5.53 mg/dL — ABNORMAL HIGH (ref 0.44–1.00)
Creatinine, Ser: 5.54 mg/dL — ABNORMAL HIGH (ref 0.44–1.00)
GFR, Estimated: 8 mL/min — ABNORMAL LOW (ref >60)
GFR, Estimated: 8 mL/min — ABNORMAL LOW (ref >60)
Glucose, Bld: 112 mg/dL — ABNORMAL HIGH (ref 70–99)
Glucose, Bld: 109 mg/dL — ABNORMAL HIGH (ref 70–99)
Potassium: 6.5 mmol/L (ref 3.5–5.1)
Potassium: 6.1 mmol/L — ABNORMAL HIGH (ref 3.5–5.1)
Sodium: 140 mmol/L (ref 135–145)
Sodium: 141 mmol/L (ref 135–145)
Total Bilirubin: 0.8 mg/dL (ref 0.3–1.2)
Total Bilirubin: 0.5 mg/dL (ref 0.3–1.2)
Total Protein: 8.4 g/dL — ABNORMAL HIGH (ref 6.5–8.1)
Total Protein: 8.8 g/dL — ABNORMAL HIGH (ref 6.5–8.1)

## 2020-12-11 LAB — AMMONIA: Ammonia: 37 umol/L — ABNORMAL HIGH (ref 9–35)

## 2020-12-11 LAB — LACTIC ACID, PLASMA
Lactic Acid, Venous: 1.4 mmol/L (ref 0.5–1.9)
Lactic Acid, Venous: 1.1 mmol/L (ref 0.5–1.9)

## 2020-12-11 LAB — APTT: aPTT: 21 seconds — ABNORMAL LOW (ref 24–36)

## 2020-12-11 LAB — PROTIME-INR
INR: 1.3 — ABNORMAL HIGH (ref 0.8–1.2)
Prothrombin Time: 15.7 seconds — ABNORMAL HIGH (ref 11.4–15.2)

## 2020-12-11 LAB — CK: Total CK: 2474 U/L — ABNORMAL HIGH (ref 38–234)

## 2020-12-11 LAB — TSH: TSH: 2.832 u[IU]/mL (ref 0.350–4.500)

## 2020-12-11 LAB — SARS CORONAVIRUS 2 BY RT PCR (HOSPITAL ORDER, PERFORMED IN ~~LOC~~ HOSPITAL LAB): SARS Coronavirus 2: NEGATIVE

## 2020-12-11 MED ORDER — VANCOMYCIN HCL 2000 MG/400ML IV SOLN
2000.0000 mg | Freq: Once | INTRAVENOUS | Status: AC
  Administered 2020-12-11: 2000 mg via INTRAVENOUS
  Filled 2020-12-11: qty 400

## 2020-12-11 MED ORDER — INSULIN ASPART 100 UNIT/ML IV SOLN
5.0000 [IU] | Freq: Once | INTRAVENOUS | Status: AC
  Administered 2020-12-11: 5 [IU] via INTRAVENOUS

## 2020-12-11 MED ORDER — LACTATED RINGERS IV BOLUS (SEPSIS): 1000.0000 mL | Freq: Once | INTRAVENOUS | Status: DC

## 2020-12-11 MED ORDER — SODIUM CHLORIDE 0.9 % IV SOLN
2.0000 g | INTRAVENOUS | Status: DC
  Administered 2020-12-12: 2 g via INTRAVENOUS
  Filled 2020-12-11: qty 2

## 2020-12-11 MED ORDER — LACTATED RINGERS IV SOLN: INTRAVENOUS | Status: DC

## 2020-12-11 MED ORDER — SODIUM CHLORIDE 0.9 % IV SOLN: 1.0000 g | Freq: Once | INTRAVENOUS | Status: DC

## 2020-12-11 MED ORDER — VANCOMYCIN HCL IN DEXTROSE 1-5 GM/200ML-% IV SOLN: 1000.0000 mg | Freq: Once | INTRAVENOUS | Status: DC

## 2020-12-11 MED ORDER — SODIUM CHLORIDE 0.9 % IV SOLN: Freq: Once | INTRAVENOUS | Status: AC

## 2020-12-11 MED ORDER — SODIUM CHLORIDE 0.9 % IV BOLUS
250.0000 mL | Freq: Once | INTRAVENOUS | Status: AC
  Administered 2020-12-11: 250 mL via INTRAVENOUS

## 2020-12-11 MED ORDER — SODIUM CHLORIDE 0.9 % IV SOLN
2.0000 g | Freq: Once | INTRAVENOUS | Status: AC
  Administered 2020-12-11: 2 g via INTRAVENOUS
  Filled 2020-12-11: qty 2

## 2020-12-11 MED ORDER — HEPARIN SODIUM (PORCINE) 5000 UNIT/ML IJ SOLN
5000.0000 [IU] | Freq: Three times a day (TID) | INTRAMUSCULAR | Status: DC
  Administered 2020-12-11 – 2020-12-17 (×17): 5000 [IU] via SUBCUTANEOUS
  Filled 2020-12-11 (×17): qty 1

## 2020-12-11 MED ORDER — SODIUM CHLORIDE 0.9 % IV BOLUS
1000.0000 mL | Freq: Once | INTRAVENOUS | Status: AC
  Administered 2020-12-11: 1000 mL via INTRAVENOUS

## 2020-12-11 MED ORDER — LACTATED RINGERS IV BOLUS (SEPSIS)
1000.0000 mL | Freq: Once | INTRAVENOUS | Status: AC
  Administered 2020-12-11: 1000 mL via INTRAVENOUS

## 2020-12-11 MED ORDER — METRONIDAZOLE IN NACL 5-0.79 MG/ML-% IV SOLN
500.0000 mg | Freq: Once | INTRAVENOUS | Status: AC
  Administered 2020-12-11: 500 mg via INTRAVENOUS
  Filled 2020-12-11: qty 100

## 2020-12-11 MED ORDER — DEXTROSE 50 % IV SOLN
1.0000 | Freq: Once | INTRAVENOUS | Status: AC
  Administered 2020-12-11: 50 mL via INTRAVENOUS
  Filled 2020-12-11: qty 50

## 2020-12-11 MED ORDER — VANCOMYCIN VARIABLE DOSE PER UNSTABLE RENAL FUNCTION (PHARMACIST DOSING): Status: DC

## 2020-12-11 MED ORDER — STERILE WATER FOR INJECTION IV SOLN
INTRAVENOUS | Status: DC
  Filled 2020-12-11 (×5): qty 9.62

## 2020-12-11 MED ORDER — SODIUM BICARBONATE 8.4 % IV SOLN
Freq: Once | INTRAVENOUS | Status: AC
  Filled 2020-12-11: qty 100

## 2020-12-11 MED ORDER — LACTATED RINGERS IV BOLUS (SEPSIS): 200.0000 mL | Freq: Once | INTRAVENOUS | Status: DC

## 2020-12-11 NOTE — ED Triage Notes (Signed)
Pt arrived to ED via [facility] from [facility]. Pt was here on the 19th for sepsis, was discharged to [facility] on keflex. [facility] staff called EMS today d/t decreases LOC. EMS reports pt is responsive to painful stimuli only.  EMS VS: initial BP 86/50, last BP 90/70, HR 105-110, 94% RA 96% on 2L, ETCO2 31, Temp 96.2

## 2020-12-11 NOTE — ED Provider Notes (Signed)
[facility] EMERGENCY DEPARTMENT Provider Note   CSN: [id number] Arrival date & time: 12/11/20  1119     History Chief Complaint  Patient presents with  . Code Sepsis  . Altered Mental Status    Lauren Barber is a 65 y.o. female with a hx of prior stroke, hypertension, anemia, CKD, hypothyroidism, T2DM, & bedridden status after her CVA who presents to the ED from [facility] via EMS for evaluation of AMS. Patient is currently not answering any questions- level 5 caveat applies secondary to AMS. I called & spoke with staff @ [facility] who relay that over the past couple of days patient has started to have decline in mental status, she is not speaking much and has been much sleepier, worse this AM. At baseline she can communicate appropriately & is alert & oriented x 4.  She is full code. They have not noted any fever, dyspnea, or urinary issues. On chart review ED visit 12/07/20- AMS, seemed improved, discharged back to facility on keflex, culture grew out multiple species. Per EMS BP 86/50 initially 90/70 with repeat. Oral temp 96.2. HR 105-110.   HPI     Past Medical History:  Diagnosis Date  . Anemia   . Anxiety   . Atopic conjunctivitis   . Cardiomegaly   . Cellulitis    legs  . Cellulitis of right thigh 08/13/2016  . Chronic kidney disease    stage 3 per nursing home chart  . CVA (cerebral vascular accident) ([place])   . Degenerative, intervertebral disc    back, legs  . Depressive disorder    depressive disorder  . Diabetes mellitus without complication ([place])   . Endometriosis   . Essential hypertension 04/12/2007   Qualifier: Diagnosis of  By: Jobe Igo MD, Shanon Brow    . Gout    feet  . Headache(784.0)    otc meds prn  . History of blood transfusion [place] - unsure number of units transfused  . Hypertension   . Hypothyroidism   . Insomnia   . Neuromuscular disorder ([place])    diabetic neuropathy - feet  . Obesities, morbid ([place])   .  Stroke [facility]) 2013  . Vaginal bleeding     Patient Active Problem List   Diagnosis Date Noted  . Abnormal CT scan   . Chronic abdominal pain 09/26/2017  . Acute panniculitis 09/26/2017  . Mass of sigmoid colon by CT scan 09/26/2017  . Diastasis recti (NO HERNIA) 09/26/2017  . Leukocytosis 09/26/2017  . 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 Bedridden 09/26/2017  . Depressive disorder   . Ingrown nail of great toe of right foot 10/24/2016  . Bilateral lower extremity edema 06/11/2016  . Type II diabetes mellitus with peripheral autonomic neuropathy ([place]) 05/05/2016  . Dyslipidemia associated with type 2 diabetes mellitus ([place]) 05/05/2016  . Hypothyroidism 06/06/2015  . Chronic kidney disease (CKD), stage III (moderate) ([place]) 12/24/2014  . Insomnia 09/09/2014  . Slow transit constipation 09/09/2014  . Peripheral autonomic neuropathy due to DM ([place]) 12/02/2013  . Constipation 10/24/2013  . Gout 07/03/2013  . Anemia 02/18/2013  . Chronic pain 02/18/2013  . Depression 02/18/2013  . Obesity, Class III, BMI 40-49.9 (morbid obesity) ([place]) 04/12/2007  . Essential hypertension 04/12/2007  . DEGENERATION, DISC NOS 04/12/2007    Past Surgical History:  Procedure Laterality Date  . BIOPSY ENDOMETRIAL N/A 10 03 2013  . CESAREAN SECTION  1982, 1989   x 2  . COLONOSCOPY WITH PROPOFOL N/A  11/14/2017   Procedure: COLONOSCOPY WITH PROPOFOL;  Surgeon: Milus Banister, MD;  Location: [facility];  Service: Endoscopy;  Laterality: N/A;  . DILATION AND CURETTAGE OF UTERUS  2013  . HYSTEROSCOPY WITH D & C N/A 06/28/2014   Procedure: DILATATION AND CURETTAGE /HYSTEROSCOPY;  Surgeon: Lavonia Drafts, MD;  Location: [facility];  Service: Gynecology;  Laterality: N/A;  . INCISIONAL HERNIA REPAIR N/A 09/26/2017   Procedure: DIAGNOSTIC LAPAROSCOPY;  Surgeon: Michael Boston, MD;  Location: [facility];  Service: General;  Laterality: N/A;  . TONSILLECTOMY    . WISDOM TOOTH EXTRACTION       OB  History    Gravida  2   Para  2   Term  2   Preterm  0   AB  0   Living  2     SAB  0   IAB  0   Ectopic  0   Multiple  0   Live Births              Family History  Problem Relation Age of Onset  . Hypertension Mother   . Hypertension Father     Social History   Tobacco Use  . Smoking status: Former Smoker    Packs/day: 1.00    Years: 43.00    Pack years: 43.00    Types: Cigarettes    Quit date: 06/11/2012    Years since quitting: 8.5  . Smokeless tobacco: Never Used  Vaping Use  . Vaping Use: Never used  Substance Use Topics  . Alcohol use: No  . Drug use: No    Home Medications Prior to Admission medications   Medication Sig Start Date End Date Taking? Authorizing Provider  acetaminophen (TYLENOL) 325 MG tablet Take 650 mg every 6 (six) hours as needed by mouth for headache.     [provider]  allopurinol (ZYLOPRIM) 100 MG tablet Take 200 mg by mouth daily.    [provider]  amitriptyline (ELAVIL) 100 MG tablet Take 100 mg by mouth at bedtime.    [provider]  bisacodyl (DULCOLAX) 10 MG suppository Place 1 suppository (10 mg total) daily rectally. 09/28/17   Patrecia Pour, MD  Carboxymethylcellulose Sodium (REFRESH TEARS OP) Place 1 drop into both eyes every 4 (four) hours as needed (for dry eyes).    [provider]  cephALEXin (KEFLEX) 250 MG capsule Take 1 capsule (250 mg total) by mouth 3 (three) times daily. 12/07/20   Hayden Rasmussen, MD  colchicine 0.6 MG tablet Take 0.6 mg by mouth daily.    [provider]  Diclofenac Sodium 1.6 % GEL Place 2 g onto the skin 3 (three) times daily.    [provider]  diphenhydrAMINE (BENADRYL) 25 MG tablet Take 25 mg every 8 (eight) hours as needed by mouth for allergies.     [provider]  EPINEPHrine (EPIPEN) 0.3 mg/0.3 mL DEVI Inject 0.3 mg daily as needed into the muscle (allergic reaction).     [provider]  fenofibrate  (TRICOR) 48 MG tablet Take 48 mg by mouth at bedtime.    [provider]  FLUoxetine (PROZAC) 20 MG capsule Take 60 mg daily by mouth.    [provider]  gabapentin (NEURONTIN) 600 MG tablet Take 600 mg by mouth 3 (three) times daily.     [provider]  HYDROcodone-acetaminophen (NORCO/VICODIN) 5-325 MG tablet Take 1 tablet daily as needed by mouth for moderate pain. Patient taking  differently: Take 1 tablet by mouth See admin instructions. Take 1 tablet by mouth every 12 hours. Take 1 tablet by mouth daily as needed for pain 09/27/17   Patrecia Pour, MD  hydrocortisone cream 1 % Apply 1 application 2 (two) times daily as needed topically (for irritated area).     [provider]  levothyroxine (SYNTHROID, LEVOTHROID) 50 MCG tablet Take 1 tablet (50 mcg total) by mouth daily. 11/18/15   Gerlene Fee, NP  linagliptin (TRADJENTA) 5 MG TABS tablet Take 5 mg by mouth daily.    [provider]  Melatonin 3 MG TABS Take 3 mg by mouth at bedtime.    [provider]  Menthol, Topical Analgesic, (BIOFREEZE ROLL-ON EX) Apply 1 application 4 (four) times daily topically. Apply to joints four times a day for joint pain.    [provider]  metoCLOPramide (REGLAN) 10 MG tablet Take 1 tablet (10 mg total) by mouth as directed. 10/24/17   Levin Erp, PA  Na Sulfate-K Sulfate-Mg Sulf (SUPREP BOWEL PREP KIT) 17.5-3.13-1.6 GM/177ML SOLN Take 1 kit by mouth as directed. 10/24/17   Levin Erp, PA  pantoprazole (PROTONIX) 40 MG tablet Take 1 tablet (40 mg total) by mouth daily. 12/19/14   Rai, Ripudeep K, MD  polyethylene glycol (MIRALAX / GLYCOLAX) packet Take 34 g 3 (three) times daily by mouth. 09/27/17   Patrecia Pour, MD  polyvinyl alcohol (LIQUIFILM TEARS) 1.4 % ophthalmic solution Place 1 drop into both eyes every 4 (four) hours as needed for dry eyes.    [provider]  psyllium (HYDROCIL/METAMUCIL) 95 % PACK Take 1  packet 2 (two) times daily by mouth. 09/27/17   Patrecia Pour, MD  sennosides-docusate sodium (SENOKOT-S) 8.6-50 MG tablet Take 2 tablets 2 (two) times daily by mouth. 09/27/17   Patrecia Pour, MD  sodium chloride (OCEAN) 0.65 % SOLN nasal spray Place 1 spray into both nostrils as needed for congestion.    [provider]  spironolactone (ALDACTONE) 25 MG tablet Take 25 mg by mouth daily.    [provider]  tiZANidine (ZANAFLEX) 2 MG tablet Take 2 mg 4 (four) times daily by mouth.    [provider]  torsemide (DEMADEX) 10 MG tablet Take 10 mg by mouth daily.     [provider]  Vitamin D, Ergocalciferol, (DRISDOL) 50000 units CAPS capsule Take 50,000 Units by mouth every Wednesday.     [provider]    Allergies    Nsaids and Shellfish allergy  Review of Systems   Review of Systems  Unable to perform ROS: Mental status change    Physical Exam Updated Vital Signs BP 90/60 (BP Location: Right Arm)   Pulse (!) 105   Temp 98.5 F (36.9 C) (Rectal)   Resp 18   Ht _0  (1.753 m)   Wt 134.3 kg   SpO2 99%   BMI 43.71 kg/m   Physical Exam Vitals and nursing note reviewed.  Constitutional:      Appearance: She is ill-appearing.  HENT:     Head: Normocephalic and atraumatic.     Mouth/Throat:     Comments: Extremely dry MM.  Eyes:     Pupils: Pupils are equal, round, and reactive to light.  Cardiovascular:     Rate and Rhythm: Regular rhythm. Tachycardia present.     Comments: 2+ symmetric radial pulses.  Pulmonary:     Effort: Pulmonary effort is normal. No respiratory distress.  Breath sounds: Normal breath sounds. No wheezing, rhonchi or rales.  Abdominal:     General: There is no distension.     Palpations: Abdomen is soft.     Tenderness: There is no abdominal tenderness. There is no guarding or rebound.  Musculoskeletal:     Cervical back: Neck supple. No rigidity.  Skin:    General: Skin is warm and dry.      Comments: Skin break down to the bilateral gluteal/uppper thigh regions. No purulent drainage.   Neurological:     Comments: Eyes open spontaneously at times, open to painful stimuli consistently. Moans at times. Protecting airway at this time.        ED Results / Procedures / Treatments   Labs (all labs ordered are listed, but only abnormal results are displayed) Labs Reviewed  COMPREHENSIVE METABOLIC PANEL - Abnormal; Notable for the following components:      Result Value   Potassium 6.5 (*)    CO2 12 (*)    Glucose, Bld 112 (*)    BUN 161 (*)    Creatinine, Ser 5.53 (*)    Total Protein 8.4 (*)    Albumin 3.4 (*)    AST 193 (*)    ALT 266 (*)    Alkaline Phosphatase 172 (*)    GFR, Estimated 8 (*)    Anion gap 18 (*)    All other components within normal limits  PROTIME-INR - Abnormal; Notable for the following components:   Prothrombin Time 15.7 (*)    INR 1.3 (*)    All other components within normal limits  APTT - Abnormal; Notable for the following components:   aPTT 21 (*)    All other components within normal limits  URINALYSIS, ROUTINE W REFLEX MICROSCOPIC - Abnormal; Notable for the following components:   Color, Urine AMBER (*)    APPearance CLOUDY (*)    Hgb urine dipstick SMALL (*)    Ketones, ur 5 (*)    Protein, ur 100 (*)    Leukocytes,Ua LARGE (*)    WBC, UA >50 (*)    Bacteria, UA MANY (*)    Non Squamous Epithelial 0-5 (*)    All other components within normal limits  CBC WITH DIFFERENTIAL/PLATELET - Abnormal; Notable for the following components:   RDW 16.8 (*)    nRBC 0.8 (*)    All other components within normal limits  CBG MONITORING, ED - Abnormal; Notable for the following components:   Glucose-Capillary 108 (*)    All other components within normal limits  URINE CULTURE  CULTURE, BLOOD (ROUTINE X 2)  CULTURE, BLOOD (ROUTINE X 2)  SARS CORONAVIRUS 2 BY RT PCR (HOSPITAL ORDER, [facility])  LACTIC ACID, PLASMA   TSH  LACTIC ACID, PLASMA  CBC WITH DIFFERENTIAL/PLATELET    EKG EKG Interpretation  Date/Time:  Sunday December 11 2020 11:51:58 EST Ventricular Rate:  104 PR Interval:    QRS Duration: 120 QT Interval:  378 QTC Calculation: 498 R Axis:   -44 Text Interpretation: Sinus or ectopic atrial tachycardia Left anterior fascicular block Probable left ventricular hypertrophy Nonspecific T abnormalities, lateral leads Confirmed by Dene Gentry [phone]) on 12/11/2020 12:00:28 PM   Radiology DG Chest Port 1 View  Result Date: 12/11/2020 CLINICAL DATA:  Question sepsis. EXAM: PORTABLE CHEST 1 VIEW COMPARISON:  December 07, 2020 FINDINGS: The mediastinal contour is stable. The heart size is enlarged. There is no focal infiltrate, pulmonary edema, or pleural effusion. Mild  atelectasis of the left mid lung is noted. The bony structures are normal. IMPRESSION: No focal pneumonia. Electronically Signed   By: Abelardo Diesel M.D.   On: 12/11/2020 13:27    Procedures .Critical Care Performed by: Amaryllis Dyke, PA-C Authorized by: Amaryllis Dyke, PA-C     CRITICAL CARE Performed by: Kennith Maes   Total critical care time: 45 minutes  Critical care time was exclusive of separately billable procedures and treating other patients.  Critical care was necessary to treat or prevent imminent or life-threatening deterioration.  Critical care was time spent personally by me on the following activities: development of treatment plan with patient and/or surrogate as well as nursing, discussions with consultants, evaluation of patient's response to treatment, examination of patient, obtaining history from patient or surrogate, ordering and performing treatments and interventions, ordering and review of laboratory studies, ordering and review of radiographic studies, pulse oximetry and re-evaluation of patient's condition. (including critical care time)  Medications Ordered in  ED Medications  ceFEPIme (MAXIPIME) 2 g in sodium chloride 0.9 % 100 mL IVPB (has no administration in time range)  metroNIDAZOLE (FLAGYL) IVPB 500 mg (has no administration in time range)  vancomycin (VANCOREADY) IVPB 2000 mg/400 mL (has no administration in time range)  insulin aspart (novoLOG) injection 5 Units (has no administration in time range)    And  dextrose 50 % solution 50 mL (has no administration in time range)  sodium bicarbonate 100 mEq in dextrose 5 % 1,000 mL infusion (has no administration in time range)  sodium chloride 0.9 % bolus 1,000 mL (has no administration in time range)  sodium chloride 0.9 % bolus 250 mL (has no administration in time range)  ceFEPIme (MAXIPIME) 2 g in sodium chloride 0.9 % 100 mL IVPB (has no administration in time range)  vancomycin variable dose per unstable renal function (pharmacist dosing) (has no administration in time range)  0.9 %  sodium chloride infusion (has no administration in time range)  lactated ringers bolus 1,000 mL (1,000 mLs Intravenous New Bag/Given 12/11/20 1326)    ED Course  I have reviewed the triage vital signs and the nursing notes.  Pertinent labs & imaging results that were available during my care of the patient were reviewed by me and considered in my medical decision making (see chart for details).    MDM Rules/Calculators/A&P                         Patient presents to the ED for evaluation of AMS.  DDX: Infectious process/sepsis, metabolic derangement, uremia, thyroid dysfunction, ischemic/hemorrhagic stroke.  Additional history obtained:  Additional history obtained from chart review, EMS, & discussion w/ nursing staff.   ED Course:  On initial assessment BP 90s/60s, HR 105- mildly tachycardic, afebrile, appears very dry. Start 1L LR and will obtain labs with evolving sepsis order set.  CBG 108.   Imaging Studies ordered:  I ordered imaging studies which included CXR, I independently visualized and  interpreted imaging which showed no focal pneumonia. CT head pending.   13:44: I was unaware of Bps < 09N systolic until this time, additional 1200 cc LR ordered to obtain ideal body weight 30 cc/kg bolus. Initial plan was for rocephin but will broaden to cefepime, vanc, and flagyl per discussion w/ attending. Code sepsis has been called. Initially there was difficulty with IV access which has delayed patient's care some, access attempted by nursing staff and my attending, ultimately just  now able to obtain IV access by IV team and above interventions will be initiated.   Lab Tests:  I Ordered, reviewed, and interpreted labs, which included:  CBC: no significant leukocytosis.  CMP: Acute renal failure with creatinine 5.53 and BUN 161, bicarb is 12, anion gap is 18.  She is hyperkalemic at 6.5.  LFTs are similar to prior somewhat. PT/INR: Mild elevation APTT: Mildly decreased UA: UTI, sent for culture.  TSH: WNL Lactic: WNL  13:54: Patient noted to be in acute renal failure. She is hyperkalemic to 6.5 with slight hemolysis, mildly peaked T waves noted on EKG, bicarb/insulin/dextrose ordered, based on mental status not appropriate for PO lokelma at this time.  LR switched to NS with her hyperkalemia.   14:07: CONSULT: Discussed with nephrologist Dr. Carolin Sicks- in agreement with plan for hydration, sodium bicarb, and insulin/dextrose. Will see in consultation.   Plan for admission for AMS, acute renal failure, hyperkalemia, & UTI.  Discussed with family medicine residency service- accept admission.    Sepsis - Repeat Assessment Performed at: 1600 Vital signs: BP: 107/75, Pulse: 98, RR: 12, Temp 98.5 F,  Heart:     Regular rate and rhythm  Lungs:    No obvious adventitious breath sounds Capillary Refill:   <2 sec Peripheral Pulse:   Radial pulse palpable Skin:     Dry   Findings and plan of care discussed with supervising physician Dr. Francia Greaves who has evaluated patient as shared visit & is  in agreement.   Portions of this note were generated with [employer]. Dictation errors may occur despite best attempts at proofreading.  Final Clinical Impression(s) / ED Diagnoses Final diagnoses:  Altered mental status, unspecified altered mental status type  Acute renal failure, unspecified acute renal failure type ([place])  Hyperkalemia  Urinary tract infection with hematuria, site unspecified    Rx / DC Orders ED Discharge Orders    None       Amaryllis Dyke, PA-C 12/13/20 0048    Valarie Merino, MD 12/14/20 1055

## 2020-12-11 NOTE — Progress Notes (Signed)
Potassium level greatly improved from 6.5 > 5.5.   Milus Banister, [place], PGY-3 12/11/2020 11:54 PM

## 2020-12-11 NOTE — ED Notes (Signed)
Unable to obtain IV access x 2. MD at bedside for EJ insertion

## 2020-12-11 NOTE — ED Notes (Signed)
Urine specimen obtained through in and out cath

## 2020-12-11 NOTE — Progress Notes (Signed)
FPTS Interim Progress Note  S: Went to bedside to assess patient.  Still minimally responsive.  O: BP 104/72   Pulse 94   Temp 98.5 F (36.9 C) (Rectal)   Resp 12   Ht 5\' 9"  (1.753 m)   Wt 134.3 kg   SpO2 98%   BMI 43.71 kg/m   General: Obese female laying in bed, occasionally muttering incomprehensible sounds and opening eyes to voice, did say "ouch" with sternal rub CV: RRR, no murmurs Respiratory: CTAB, airway intact, normal WOB on room air  A/P: Altered mental status In the setting of acute renal failure.  Still minimally responsive though appears to be slightly improved from earlier today.  Hyperkalemia K 6.5 earlier today s/p insulin, dextrose, and sodium bicarb. Awaiting repeat RFP.  Zola Button, MD 12/11/2020, 10:39 PM PGY-1, [place] pager [phone]

## 2020-12-11 NOTE — ED Notes (Signed)
IV team at bedside 

## 2020-12-11 NOTE — Sepsis Progress Note (Signed)
Sepsis protocol is being monitored by [facility]. 

## 2020-12-11 NOTE — Consult Note (Addendum)
[facility] Consultation Note  Requesting MD: Dr Andrena Mews Reason for consult: AKI  HPI:  Lauren Barber is a 65 y.o. female with history of hypertension, obesity, DM, neuropathy, stroke, who was arrived via EMS for altered mental status, seen as a consultation for the evaluation of worsening renal failure and hyperkalemia. Patient was in ER on 1/19 for sepsis due to UTI and discharged with Keflex.  The urine culture came back polymicrobial organism.  Per EMS note patient was responsive to only painful stimuli.  The vital was 86/50 initially. On arrival to the ER, patient was tachycardic, BP 70/48, in room air.  The labs showed potassium 6.5, CO2 12, BUN 161, creatinine level 5.53 and WBC count 7.7.  Lactic acid was 1.4. The baseline serum creatinine level seems to be around one-point 5-2 however the creatinine level was elevated to 2.76 during recent ER visit on 1/19. In the ER she received 2 L of IV fluid bolus and treated hyperkalemia with insulin, dextrose, sodium bicarbonate.  Also treated with cefepime, Flagyl and vancomycin for sepsis. Per nursing staff, the history at ins and out with mostly pus and unable to drain bladder completely. Patient is not following commands and mostly not responding.  Exact baseline mental status unknown.  Review of system limited. Home medication list has polypharmacy including spironolactone, torsemide 10 mg and allopurinol.  Creatinine, Ser  Date/Time Value Ref Range Status  12/11/2020 12:44 PM 5.53 (H) 0.44 - 1.00 mg/dL Final  12/07/2020 11:08 AM 2.76 (H) 0.44 - 1.00 mg/dL Final  09/26/2017 02:25 AM 2.11 (H) 0.44 - 1.00 mg/dL Final  06/03/2015 05:42 AM 1.55 (H) 0.44 - 1.00 mg/dL Final    PMHx:   Past Medical History:  Diagnosis Date  . Anemia   . Anxiety   . Atopic conjunctivitis   . Cardiomegaly   . Cellulitis    legs  . Cellulitis of right thigh 08/13/2016  . Chronic kidney disease    stage 3 per nursing  home chart  . CVA (cerebral vascular accident) ([place])   . Degenerative, intervertebral disc    back, legs  . Depressive disorder    depressive disorder  . Diabetes mellitus without complication ([place])   . Endometriosis   . Essential hypertension 04/12/2007   Qualifier: Diagnosis of  By: Jobe Igo MD, Shanon Brow    . Gout    feet  . Headache(784.0)    otc meds prn  . History of blood transfusion [place] - unsure number of units transfused  . Hypertension   . Hypothyroidism   . Insomnia   . Neuromuscular disorder ([place])    diabetic neuropathy - feet  . Obesities, morbid ([place])   . Stroke [facility]) 2013  . Vaginal bleeding     Past Surgical History:  Procedure Laterality Date  . BIOPSY ENDOMETRIAL N/A 10 03 2013  . [place]   x 2  . COLONOSCOPY WITH PROPOFOL N/A 11/14/2017   Procedure: COLONOSCOPY WITH PROPOFOL;  Surgeon: Milus Banister, MD;  Location: [facility];  Service: Endoscopy;  Laterality: N/A;  . DILATION AND CURETTAGE OF UTERUS  2013  . HYSTEROSCOPY WITH D & C N/A 06/28/2014   Procedure: DILATATION AND CURETTAGE /HYSTEROSCOPY;  Surgeon: Lavonia Drafts, MD;  Location: [facility];  Service: Gynecology;  Laterality: N/A;  . INCISIONAL HERNIA REPAIR N/A 09/26/2017   Procedure: DIAGNOSTIC LAPAROSCOPY;  Surgeon: Michael Boston, MD;  Location: [facility];  Service: General;  Laterality: N/A;  .  TONSILLECTOMY    . WISDOM TOOTH EXTRACTION      Family Hx:  Family History  Problem Relation Age of Onset  . Hypertension Mother   . Hypertension Father     Social History:  reports that she quit smoking about 8 years ago. Her smoking use included cigarettes. She has a 43.00 pack-year smoking history. She has never used smokeless tobacco. She reports that she does not drink alcohol and does not use drugs.  Allergies:  Allergies  Allergen Reactions  . Nsaids Other (See Comments)    CKD Cr 2  . Shellfish Allergy Other (See Comments)    Per MAR     Medications: Prior to Admission medications   Medication Sig Start Date End Date Taking? Authorizing Provider  cephALEXin (KEFLEX) 250 MG capsule Take 1 capsule (250 mg total) by mouth 3 (three) times daily. 12/07/20  Yes Terrilee Files, MD  acetaminophen (TYLENOL) 325 MG tablet Take 650 mg every 6 (six) hours as needed by mouth for headache.     [provider]  allopurinol (ZYLOPRIM) 100 MG tablet Take 200 mg by mouth daily.    [provider]  amitriptyline (ELAVIL) 100 MG tablet Take 100 mg by mouth at bedtime.    [provider]  amitriptyline (ELAVIL) 75 MG tablet Take 75 mg by mouth at bedtime. 11/22/20   [provider]  atorvastatin (LIPITOR) 20 MG tablet Take 20 mg by mouth daily. 11/18/20   [provider]  bisacodyl (DULCOLAX) 10 MG suppository Place 1 suppository (10 mg total) daily rectally. 09/28/17   Tyrone Nine, MD  Carboxymethylcellulose Sodium (REFRESH TEARS OP) Place 1 drop into both eyes every 4 (four) hours as needed (for dry eyes).    [provider]  colchicine 0.6 MG tablet Take 0.6 mg by mouth daily.    [provider]  desvenlafaxine (PRISTIQ) 100 MG 24 hr tablet Take 100 mg by mouth daily. 12/03/20   [provider]  diclofenac Sodium (VOLTAREN) 1 % GEL Apply 2 g topically 4 (four) times daily. 08/23/20   [provider]  Diclofenac Sodium 1.6 % GEL Place 2 g onto the skin 3 (three) times daily.    [provider]  diphenhydrAMINE (BENADRYL) 25 MG tablet Take 25 mg every 8 (eight) hours as needed by mouth for allergies.     [provider]  EPINEPHrine (EPI-PEN) 0.3 mg/0.3 mL DEVI Inject 0.3 mg daily as needed into the muscle (allergic reaction).     [provider]  famotidine (PEPCID) 20 MG tablet Take 20 mg by mouth daily. 12/08/20   [provider]  fenofibrate (TRICOR) 48 MG tablet Take 48 mg by mouth at bedtime.    [provider]   FLUoxetine (PROZAC) 10 MG capsule Take 10 mg by mouth daily. 09/06/20   [provider]  FLUoxetine (PROZAC) 20 MG capsule Take 60 mg daily by mouth.    [provider]  FLUoxetine (PROZAC) 20 MG tablet Take by mouth. 09/30/20   [provider]  FLUoxetine (PROZAC) 40 MG capsule Take 40 mg by mouth daily. 10/04/20   [provider]  gabapentin (NEURONTIN) 600 MG tablet Take 600 mg by mouth 3 (three) times daily.     [provider]  HYDROcodone-acetaminophen (NORCO/VICODIN) 5-325 MG tablet Take 1 tablet daily as needed by mouth for moderate pain. Patient taking differently: Take 1 tablet by mouth See admin instructions. Take 1 tablet by mouth every 12 hours.  Take 1 tablet by mouth daily as needed for pain 09/27/17   Patrecia Pour, MD  hydrocortisone cream 1 % Apply 1 application 2 (two) times daily as needed topically (for irritated area).     [provider]  JANUVIA 50 MG tablet Take 50 mg by mouth daily. 12/08/20   [provider]  levothyroxine (SYNTHROID, LEVOTHROID) 50 MCG tablet Take 1 tablet (50 mcg total) by mouth daily. 11/18/15   Gerlene Fee, NP  linagliptin (TRADJENTA) 5 MG TABS tablet Take 5 mg by mouth daily.    [provider]  lubiprostone (AMITIZA) 8 MCG capsule  12/05/20   [provider]  Melatonin 3 MG TABS Take 3 mg by mouth at bedtime.    [provider]  Menthol, Topical Analgesic, (BIOFREEZE ROLL-ON EX) Apply 1 application 4 (four) times daily topically. Apply to joints four times a day for joint pain.    [provider]  metoCLOPramide (REGLAN) 10 MG tablet Take 1 tablet (10 mg total) by mouth as directed. 10/24/17   Levin Erp, PA  MITIGARE 0.6 MG CAPS Take by mouth. 12/05/20   [provider]  Na Sulfate-K Sulfate-Mg Sulf (SUPREP BOWEL PREP KIT) 17.5-3.13-1.6 GM/177ML SOLN Take 1 kit by mouth as directed. 10/24/17   Levin Erp, PA   pantoprazole (PROTONIX) 40 MG tablet Take 1 tablet (40 mg total) by mouth daily. 12/19/14   Rai, Ripudeep K, MD  polyethylene glycol (MIRALAX / GLYCOLAX) packet Take 34 g 3 (three) times daily by mouth. 09/27/17   Patrecia Pour, MD  polyvinyl alcohol (LIQUIFILM TEARS) 1.4 % ophthalmic solution Place 1 drop into both eyes every 4 (four) hours as needed for dry eyes.    [provider]  psyllium (HYDROCIL/METAMUCIL) 95 % PACK Take 1 packet 2 (two) times daily by mouth. 09/27/17   Patrecia Pour, MD  sennosides-docusate sodium (SENOKOT-S) 8.6-50 MG tablet Take 2 tablets 2 (two) times daily by mouth. 09/27/17   Patrecia Pour, MD  sodium chloride (OCEAN) 0.65 % SOLN nasal spray Place 1 spray into both nostrils as needed for congestion.    [provider]  spironolactone (ALDACTONE) 25 MG tablet Take 25 mg by mouth daily.    [provider]  tiZANidine (ZANAFLEX) 2 MG tablet Take 2 mg 4 (four) times daily by mouth.    [provider]  torsemide (DEMADEX) 10 MG tablet Take 10 mg by mouth daily.     [provider]  TRULICITY 1.5 GH/8.2XH SOPN Inject into the skin. 10/24/20   [provider]  Vitamin D, Ergocalciferol, (DRISDOL) 50000 units CAPS capsule Take 50,000 Units by mouth every Wednesday.     [provider]    I have reviewed the patient's current medications.  Labs:  Results for orders placed or performed during the hospital encounter of 12/11/20 (from the past 48 hour(s))  SARS Coronavirus 2 by RT PCR (hospital order, performed in [facility] hospital lab) Nasopharyngeal Nasopharyngeal Swab     Status: None   Collection Time: 12/11/20 11:56 AM   Specimen: Nasopharyngeal Swab  Result Value Ref Range   SARS Coronavirus 2 NEGATIVE NEGATIVE    Comment: (NOTE) SARS-CoV-2 target nucleic acids are NOT DETECTED.  The SARS-CoV-2 RNA is generally detectable in upper and lower respiratory specimens during the acute phase of infection. The  lowest concentration of SARS-CoV-2 viral copies this assay can detect is 250 copies / mL. A negative result does not preclude  SARS-CoV-2 infection and should not be used as the sole basis for treatment or other patient management decisions.  A negative result may occur with improper specimen collection / handling, submission of specimen other than nasopharyngeal swab, presence of viral mutation(s) within the areas targeted by this assay, and inadequate number of viral copies (<250 copies / mL). A negative result must be combined with clinical observations, patient history, and epidemiological information.  Fact Sheet for Patients:   StrictlyIdeas.no  Fact Sheet for Healthcare Providers: BankingDealers.co.za  This test is not yet approved or  cleared by the [place] FDA and has been authorized for detection and/or diagnosis of SARS-CoV-2 by FDA under an Emergency Use Authorization (EUA).  This EUA will remain in effect (meaning this test can be used) for the duration of the COVID-19 declaration under Section 564(b)(1) of the Act, 21 U.S.C. section 360bbb-3(b)(1), unless the authorization is terminated or revoked sooner.  Performed at [facility], [place] [address], [place], [place] [address]   Urinalysis, Routine w reflex microscopic Urine, Catheterized     Status: Abnormal   Collection Time: 12/11/20 12:21 PM  Result Value Ref Range   Color, Urine AMBER (A) YELLOW    Comment: BIOCHEMICALS MAY BE AFFECTED BY COLOR   APPearance CLOUDY (A) CLEAR   Specific Gravity, Urine 1.017 1.005 - 1.030   pH 6.0 5.0 - 8.0   Glucose, UA NEGATIVE NEGATIVE mg/dL   Hgb urine dipstick SMALL (A) NEGATIVE   Bilirubin Urine NEGATIVE NEGATIVE   Ketones, ur 5 (A) NEGATIVE mg/dL   Protein, ur 100 (A) NEGATIVE mg/dL   Nitrite NEGATIVE NEGATIVE   Leukocytes,Ua LARGE (A) NEGATIVE   RBC / HPF 6-10 0 - 5 RBC/hpf   WBC, UA >50 (H) 0 - 5 WBC/hpf    Bacteria, UA MANY (A) NONE SEEN   Squamous Epithelial / LPF 0-5 0 - 5   WBC Clumps PRESENT    Mucus PRESENT    Hyaline Casts, UA PRESENT    Non Squamous Epithelial 0-5 (A) NONE SEEN    Comment: Performed at [facility], [place] [address], [place], [place] [address]  Comprehensive metabolic panel     Status: Abnormal   Collection Time: 12/11/20 12:44 PM  Result Value Ref Range   Sodium 140 135 - 145 mmol/L   Potassium 6.5 (HH) 3.5 - 5.1 mmol/L    Comment: SLIGHT HEMOLYSIS CRITICAL RESULT CALLED TO, READ BACK BY AND VERIFIED WITH: E DIXSON RN [id number] BY A BENNETT    Chloride 110 98 - 111 mmol/L   CO2 12 (L) 22 - 32 mmol/L   Glucose, Bld 112 (H) 70 - 99 mg/dL    Comment: Glucose reference range applies only to samples taken after fasting for at least 8 hours.   BUN 161 (H) 8 - 23 mg/dL   Creatinine, Ser 5.53 (H) 0.44 - 1.00 mg/dL   Calcium 10.3 8.9 - 10.3 mg/dL   Total Protein 8.4 (H) 6.5 - 8.1 g/dL   Albumin 3.4 (L) 3.5 - 5.0 g/dL   AST 193 (H) 15 - 41 U/L   ALT 266 (H) 0 - 44 U/L   Alkaline Phosphatase 172 (H) 38 - 126 U/L   Total Bilirubin 0.8 0.3 - 1.2 mg/dL   GFR, Estimated 8 (L) >60 mL/min    Comment: (NOTE) Calculated using the CKD-EPI Creatinine Equation (2021)    Anion gap 18 (H) 5 - 15    Comment: Performed at [facility], [place] [address], [place], [place] [address]  Protime-INR     Status: Abnormal   Collection Time: 12/11/20 12:44 PM  Result Value Ref Range   Prothrombin Time 15.7 (H) 11.4 - 15.2 seconds   INR 1.3 (H) 0.8 - 1.2    Comment: (NOTE) INR goal varies based on device and disease states. Performed at [facility], [place] [address], [place], [place] [address]   APTT     Status: Abnormal   Collection Time: 12/11/20 12:44 PM  Result Value Ref Range   aPTT 21 (L) 24 - 36 seconds    Comment: Performed at [facility] [address] [place], [place] [address]  Lactic acid, plasma     Status: None   Collection Time: 12/11/20   1:13 PM  Result Value Ref Range   Lactic Acid, Venous 1.4 0.5 - 1.9 mmol/L    Comment: Performed at [facility] [address], [place], [place] [address]  TSH     Status: None   Collection Time: 12/11/20  1:13 PM  Result Value Ref Range   TSH 2.832 0.350 - 4.500 uIU/mL    Comment: Performed by a 3rd Generation assay with a functional sensitivity of <=0.01 uIU/mL. Performed at [facility], [place] [address], [place], [place] [address]   CBC with Differential/Platelet     Status: Abnormal   Collection Time: 12/11/20  1:13 PM  Result Value Ref Range   WBC 7.7 4.0 - 10.5 K/uL   RBC 4.05 3.87 - 5.11 MIL/uL   Hemoglobin 12.1 12.0 - 15.0 g/dL   HCT 39.2 36.0 - 46.0 %   MCV 96.8 80.0 - 100.0 fL   MCH 29.9 26.0 - 34.0 pg   MCHC 30.9 30.0 - 36.0 g/dL   RDW 16.8 (H) 11.5 - 15.5 %   Platelets 233 150 - 400 K/uL   nRBC 0.8 (H) 0.0 - 0.2 %   Neutrophils Relative % 63 %   Neutro Abs 4.9 1.7 - 7.7 K/uL   Lymphocytes Relative 24 %   Lymphs Abs 1.9 0.7 - 4.0 K/uL   Monocytes Relative 9 %   Monocytes Absolute 0.7 0.1 - 1.0 K/uL   Eosinophils Relative 3 %   Eosinophils Absolute 0.3 0.0 - 0.5 K/uL   Basophils Relative 0 %   Basophils Absolute 0.0 0.0 - 0.1 K/uL   Immature Granulocytes 1 %   Abs Immature Granulocytes 0.07 0.00 - 0.07 K/uL    Comment: Performed at [facility] [address], [place], [place] [address]  POC CBG, ED     Status: Abnormal   Collection Time: 12/11/20  1:24 PM  Result Value Ref Range   Glucose-Capillary 108 (H) 70 - 99 mg/dL    Comment: Glucose reference range applies only to samples taken after fasting for at least 8 hours.   Comment 1 Notify RN    Comment 2 Document in Chart      ROS: Unable to obtain review of system.  Physical Exam: Vitals:   12/11/20 1353 12/11/20 1400  BP: 107/67 110/64  Pulse: 99 98  Resp: 13 13  Temp:    SpO2: 100% 97%     General exam: Unresponsive female lying on bed, looks comfortable Respiratory system:  Clear to auscultation. Respiratory effort normal. No wheezing or crackle Cardiovascular system: S1 & S2 heard, RRR.  No pedal edema. Gastrointestinal system: Abdomen is nondistended, soft and nontender. Normal bowel  sounds heard. Central nervous system: Not responding Extremities: No edema or cyanosis Skin: No rashes, lesions or ulcers Psychiatry:  unable to assess.   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 UA with purulent urine, already on broad-spectrum antibiotics. Received 2 L of IV fluid bolus with improvement of BP. Order bladder scan, kidney ultrasound Repeat lab this evening to follow-up electrolytes. Given hyperkalemia and acidosis, start IV sodium bicarbonate. Strict ins and outs and close monitoring. Other work-up of AMS including CT head pending.  Her confusion could be due to sepsis and uremia.  First we will try to manage medically with IV hydration. No plan for dialysis today.  If no improvement in kidney function or improvement in mental status then we may consider RRT.  #Hyperkalemia: Due to AKI and spironolactone.  Already received medical treatment including insulin, dextrose, IV fluid and bicarb in ER.  Repeating labs this evening.  Discontinue spironolactone.  #Metabolic acidosis due to renal failure, sepsis: Starting sodium bicarbonate.  #Acute metabolic encephalopathy: She has a history of a stroke.  Baseline mental status unknown.  Encephalopathy due to multiple etiology including sepsis, uremia.  Attempting to manage medically first.  CT head pending.  #Septic shock: Probably UTI is the main source.  Currently BP improved with IV fluid bolus.  Received empiric antibiotics including cefepime, Flagyl and vancomycin in ER.  Follow-up culture results.  Per primary team.  Thank you for the consult, we will follow with you.  Claudie Brickhouse Tanna Furry 12/11/2020, 3:36 PM  [facility].

## 2020-12-11 NOTE — H&P (Addendum)
[facility] Admission History and Physical Service Pager: [phone]  Patient name: Lauren Barber Medical record number: [id number] Date of birth: 1956-01-25 Age: 65 y.o. Gender: female  Primary Care Provider: Salome Arnt, [place] Consultants: Nephrology Code Status: FULL (confirmed with Mom and with patient's facility) Preferred Emergency Contact: Mom [phone])  Chief Complaint: AMS  Assessment and Plan: Lauren Barber is a 65 y.o. female presenting from [place] with altered mental status. PMH is significant for CKD, T2DM, depression, GERD, hyperlipidemia, hypothyroidism, history of CVA, sacral ulcers.  Altered Mental Status Patient presenting from [place] with altered mental status. At baseline she is alert and oriented x4. Per nurse at patient's facility, she has been confused and with increasing somnolence over past ~1 week. On exam she grimaces to painful stimuli but otherwise is unresponsive. She was seen in the ED 4 days ago, at which time she was diagnosed with a UTI and discharged on Keflex. Vitals initially showed mild tachycardia (low 100s) and hypotension (70/48) but BP now improved to 110/64 s/p 1L fluid bolus. She is afebrile and WBC wnl (does not meet SIRS criteria). Lactic acid wnl at 1.4. Head CT negative for acute intracranial abnormality. Labs remarkable for K 6.5, Cr 5.53, elevated AST/ALT (193/266), alk phos 172 and UA concerning for infection. Differential is extremely broad at this point and includes: UTI, other infection (ie skin- patient does have sacral wounds), pneumonia (although not seen on CXR), COVID (PCR negative today), metabolic encephalopathy (most likely), hepatic encephalopathy, CVA or other intracranial pathology, medication reaction, etc.  -Admit to FPTS, progressive, attending Dr. Gwendlyn Deutscher -Continuous cardiac monitoring and pulse ox -Vitals per routine -Consider MRI -f/u blood and urine cultures -s/p Vanc, Cefepime, and  Flagyl in the ED -plan to narrow abx pending culture results -We will check CK, ammonia, hepatitis panel -5pm CMP -am CBC and CMP -PT/OT starting 1/24  Acute renal failure on CKD Patient's creatinine 5.53 (up from 2.76 on 12/07/20). Baseline appears to be ~2. Facility reports significantly decreased PO intake over past few days. Nephrology was consulted in the emergency department and suspect etiology is UTI and severe dehydration. -Strict Is/Os -Repeat CMP at 5pm -am CMP -Continue IV fluids -Nephrology following appreciate recommendations -Avoid nephrotoxic agents  Hyperkalemia Patient's potassium 6.5 on presentation. EKG concerning for mildly peaked T waves in anterior leads. In the emergency department she received insulin with dextrose as well as sodium bicarb. -Nephrology following, appreciate recommendations -We will recheck CMP at 1700 -A.m. CMP -Holding home spironolactone  Urinary tract infection Today UA shows large leuk esterase, negative nitrite, and many bacteria (>50 WBCs) despite outpatient treatment with Keflex.   -s/p Vanc, Cefepime, and Flagyl in the ED -Continue broad spectrum antibiotics -Follow-up urine cultures -Monitor for fevers -Daily CBC  T2DM Glucose 112 on presentation. Most recent A1c is from 2018, and was 6.3 at that time.  Home medications include Januvia 50 mg/day and Trulicity 1.5 mg weekly.  -Recheck A1c -Sensitive sliding scale -CBG checks -Holding home medications  History of CVA  Hyperlipidemia Hx of prior CVA with significant deficits. Patient is bed bound at baseline. Most recent lipid panel is from 2018 per chart review. At that time: triglycerides 200, cholesterol 170, HDL 45, LDL 85. Home medications include atorvastatin 20 mg/daily and ASA 81mg  daily, also on tizanadine 2mg  QID for spasticity -We will hold home medications due to altered mental status  Hypertension, ?CHF Patient on torsemide 10mg  daily and spironolactone 25mg  daily  at home. No diagnosis of CHF  and no echo on chart review. BP has been on the lower side since arrival. -Holding torsemide and spironolactone in the setting of acute renal failure and hyperkalemia  Sacral wounds Patient with chronic sacral wounds on exam (see media tab for images). -We will consult wound care  Depression Home meds include fluoxetine 50mg  daily and desvenlafaxine 100mg  daily -Holding home meds due to altered mental status  Gout Chronic, stable. Home medications include colchicine 0.6 mg BID and allopurinol 200 mg/day. -Holding home medications due to altered mental status  GERD Chronic, stable. Home medications include Pepcid 20 mg/day -Holding home medications due to altered mental status  Hypothyroidism Home medications include Synthroid 50 mcg daily. TSH wnl on admission (2.832). -Holding home medications due to altered mental status -Consider re-starting tomorrow morning   FEN/GI: NPO Prophylaxis: heparin  Disposition: progressive  History of Present Illness:  Lauren Barber is a 65 y.o. female presenting with altered mental status.  Patient presents from [place] with altered mental status. Per nurse at patient's facility, it sounds as though patient has been more confused over past 1 week. She became more somnolent yesterday and today was essentially unresponsive. She has had decreased PO intake over past 1 week. At baseline she is alert and oriented x4. She was seen in the ED on 1/19, at which time she was diagnosed with a UTI and discharged home on Keflex.   History is limited due to altered mental status and facility able to provide fairly limited history due to staffing turnover.   Review Of Systems: Per HPI with the following additions:   Review of Systems  Unable to perform ROS: Mental status change     Patient Active Problem List   Diagnosis Date Noted  . Acute renal failure ([place]) 12/11/2020  . Abnormal CT scan   . Chronic abdominal pain  09/26/2017  . Acute panniculitis 09/26/2017  . Mass of sigmoid colon by CT scan 09/26/2017  . Diastasis recti (NO HERNIA) 09/26/2017  . Leukocytosis 09/26/2017  . 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 Bedridden 09/26/2017  . Depressive disorder   . Ingrown nail of great toe of right foot 10/24/2016  . Bilateral lower extremity edema 06/11/2016  . Type II diabetes mellitus with peripheral autonomic neuropathy ([place]) 05/05/2016  . Dyslipidemia associated with type 2 diabetes mellitus ([place]) 05/05/2016  . Hypothyroidism 06/06/2015  . Chronic kidney disease (CKD), stage III (moderate) ([place]) 12/24/2014  . Insomnia 09/09/2014  . Slow transit constipation 09/09/2014  . Peripheral autonomic neuropathy due to DM ([place]) 12/02/2013  . Constipation 10/24/2013  . Gout 07/03/2013  . Anemia 02/18/2013  . Chronic pain 02/18/2013  . Depression 02/18/2013  . Obesity, Class III, BMI 40-49.9 (morbid obesity) ([place]) 04/12/2007  . Essential hypertension 04/12/2007  . DEGENERATION, DISC NOS 04/12/2007    Past Medical History: Past Medical History:  Diagnosis Date  . Anemia   . Anxiety   . Atopic conjunctivitis   . Cardiomegaly   . Cellulitis    legs  . Cellulitis of right thigh 08/13/2016  . Chronic kidney disease    stage 3 per nursing home chart  . CVA (cerebral vascular accident) ([place])   . Degenerative, intervertebral disc    back, legs  . Depressive disorder    depressive disorder  . Diabetes mellitus without complication ([place])   . Endometriosis   . Essential hypertension 04/12/2007   Qualifier: Diagnosis of  By: Jobe Igo MD, Shanon Brow    . Gout    feet  .  Headache(784.0)    otc meds prn  . History of blood transfusion Will - unsure number of units transfused  . Hypertension   . Hypothyroidism   . Insomnia   . Neuromuscular disorder ([place])    diabetic neuropathy - feet  . Obesities, morbid ([place])   . Stroke [facility]) 2013  . Vaginal bleeding     Past Surgical  History: Past Surgical History:  Procedure Laterality Date  . BIOPSY ENDOMETRIAL N/A 10 03 2013  . Declo   x 2  . COLONOSCOPY WITH PROPOFOL N/A 11/14/2017   Procedure: COLONOSCOPY WITH PROPOFOL;  Surgeon: Milus Banister, MD;  Location: [facility] END[facility];  Service: Endoscopy;  Laterality: N/A;  . DILATION AND CURETTAGE OF UTERUS  2013  . HYSTEROSCOPY WITH D & C N/A 06/28/2014   Procedure: DILATATION AND CURETTAGE /HYSTEROSCOPY;  Surgeon: Lavonia Drafts, MD;  Location: [facility];  Service: Gynecology;  Laterality: N/A;  . INCISIONAL HERNIA REPAIR N/A 09/26/2017   Procedure: DIAGNOSTIC LAPAROSCOPY;  Surgeon: Michael Boston, MD;  Location: [facility];  Service: General;  Laterality: N/A;  . TONSILLECTOMY    . WISDOM TOOTH EXTRACTION      Social History: Social History   Tobacco Use  . Smoking status: Former Smoker    Packs/day: 1.00    Years: 43.00    Pack years: 43.00    Types: Cigarettes    Quit date: 06/11/2012    Years since quitting: 8.5  . Smokeless tobacco: Never Used  Vaping Use  . Vaping Use: Never used  Substance Use Topics  . Alcohol use: No  . Drug use: No    Family History: Family History  Problem Relation Age of Onset  . Hypertension Mother   . Hypertension Father     Allergies and Medications: Allergies  Allergen Reactions  . Nsaids Other (See Comments)    CKD Cr 2  . Shellfish Allergy Other (See Comments)    Per MAR   No current facility-administered medications on file prior to encounter.   Current Outpatient Medications on File Prior to Encounter  Medication Sig Dispense Refill  . allopurinol (ZYLOPRIM) 100 MG tablet Take 200 mg by mouth daily.    Marland Kitchen amitriptyline (ELAVIL) 75 MG tablet Take 75 mg by mouth at bedtime.    Marland Kitchen aspirin EC 81 MG tablet Take 81 mg by mouth daily. Swallow whole.    Marland Kitchen atorvastatin (LIPITOR) 20 MG tablet Take 20 mg by mouth every evening.    . cephALEXin (KEFLEX) 250 MG capsule Take 1 capsule (250 mg  total) by mouth 3 (three) times daily. 21 capsule 0  . desvenlafaxine (PRISTIQ) 100 MG 24 hr tablet Take 100 mg by mouth daily.    . famotidine (PEPCID) 20 MG tablet Take 20 mg by mouth daily.    Marland Kitchen FLUoxetine HCl (PROZAC PO) Take 50 mg by mouth daily.    Marland Kitchen gabapentin (NEURONTIN) 600 MG tablet Take 600 mg by mouth 3 (three) times daily.    Marland Kitchen JANUVIA 50 MG tablet Take 50 mg by mouth daily.    Marland Kitchen levothyroxine (SYNTHROID, LEVOTHROID) 50 MCG tablet Take 1 tablet (50 mcg total) by mouth daily. 90 tablet 3  . lubiprostone (AMITIZA) 8 MCG capsule Take 8 mcg by mouth every evening.    . Menthol, Topical Analgesic, (BIOFREEZE ROLL-ON EX) Apply 1 application topically 4 (four) times daily. Apply to joints four times a day for joint pain.    Marland Kitchen MITIGARE 0.6  MG CAPS Take 1 capsule by mouth in the morning and at bedtime.    . polyethylene glycol (MIRALAX / GLYCOLAX) packet Take 34 g 3 (three) times daily by mouth. (Patient taking differently: Take 17 g by mouth daily.) 14 each 0  . sennosides-docusate sodium (SENOKOT-S) 8.6-50 MG tablet Take 2 tablets 2 (two) times daily by mouth.    . spironolactone (ALDACTONE) 25 MG tablet Take 25 mg by mouth daily.    Marland Kitchen tiZANidine (ZANAFLEX) 2 MG tablet Take 2 mg 4 (four) times daily by mouth.    . torsemide (DEMADEX) 10 MG tablet Take 10 mg by mouth daily.     Marland Kitchen acetaminophen (TYLENOL) 325 MG tablet Take 650 mg every 6 (six) hours as needed by mouth for headache.     . bisacodyl (DULCOLAX) 10 MG suppository Place 1 suppository (10 mg total) daily rectally. (Patient not taking: Reported on 12/11/2020) 12 suppository 0  . Carboxymethylcellulose Sodium (REFRESH TEARS OP) Place 1 drop into both eyes every 4 (four) hours as needed (for dry eyes).    Marland Kitchen EPINEPHrine (EPI-PEN) 0.3 mg/0.3 mL DEVI Inject 0.3 mg daily as needed into the muscle (allergic reaction).     Marland Kitchen HYDROcodone-acetaminophen (NORCO/VICODIN) 5-325 MG tablet Take 1 tablet daily as needed by mouth for moderate pain.  (Patient not taking: Reported on 12/11/2020) 3 tablet 0  . metoCLOPramide (REGLAN) 10 MG tablet Take 1 tablet (10 mg total) by mouth as directed. (Patient not taking: Reported on 12/11/2020) 2 tablet 0  . Na Sulfate-K Sulfate-Mg Sulf (SUPREP BOWEL PREP KIT) 17.5-3.13-1.6 GM/177ML SOLN Take 1 kit by mouth as directed. (Patient not taking: Reported on 12/11/2020) 324 mL 0  . pantoprazole (PROTONIX) 40 MG tablet Take 1 tablet (40 mg total) by mouth daily. (Patient not taking: Reported on 12/11/2020)    . polyvinyl alcohol (LIQUIFILM TEARS) 1.4 % ophthalmic solution Place 1 drop into both eyes every 4 (four) hours as needed for dry eyes.    Marland Kitchen psyllium (HYDROCIL/METAMUCIL) 95 % PACK Take 1 packet 2 (two) times daily by mouth. (Patient not taking: Reported on 12/11/2020) 56 each   . sodium chloride (OCEAN) 0.65 % SOLN nasal spray Place 1 spray into both nostrils as needed for congestion.    . TRULICITY 1.5 HW/2.9HB SOPN Inject 1.5 mg into the skin once a week. Thursdays    . Vitamin D, Ergocalciferol, (DRISDOL) 50000 units CAPS capsule Take 50,000 Units by mouth every Wednesday.       Objective: BP 110/64   Pulse 98   Temp 98.5 F (36.9 C) (Rectal)   Resp 13   Ht $R'5\' 9"'KS$  (1.753 m)   Wt 134.3 kg   SpO2 97%   BMI 43.71 kg/m  Exam: General: obese female laying supine in bed Eyes: PERRL, does not attempt to close eyes when bright light shined into them. ENTM: dry mucous membranes Cardiovascular: RRR, normal S1/S2 without m/r/g Respiratory: normal WOB on room air, lungs CTAB although exam is limited Gastrointestinal: soft, nondistended Derm:  Neuro: Grimaces to sternal rub, otherwise non-responsive. Does not awaken to speech or shaking.  Patient does not retract fingers when they are pinched.  Labs and Imaging: CBC BMET  Recent Labs  Lab 12/11/20 1313  WBC 7.7  HGB 12.1  HCT 39.2  PLT 233   Recent Labs  Lab 12/11/20 1244  NA 140  K 6.5*  CL 110  CO2 12*  BUN 161*  CREATININE 5.53*   GLUCOSE 112*  CALCIUM 10.3  UA: many bacteria, > 50 WBCs, large leuks, negative nitrite  EKG: sinus tachycardia at 102bpm, LAD, peaked T waves in anterior leads  CT Head Wo Contrast Result Date: 12/07/2020 IMPRESSION: No evidence of acute intracranial abnormality. Redemonstrated chronic infarcts within the right basal ganglia and right cerebellum. Stable background mild cerebral atrophy and moderate chronic small vessel ischemic disease. Left ethmoid sinusitis.   DG Chest Port 1 View Result Date: 12/11/2020 IMPRESSION: No focal pneumonia.    Alcus Dad, MD 12/11/2020, 4:26 PM PGY-1, [place] Intern pager: [phone], text pages welcome  Upper Level Addendum:  I have seen and evaluated this patient along with Dr. Rock Nephew and reviewed the above note, making necessary revisions as appropriate. I agree with the physical exam above and the medical decision making.  Lurline Del, DO 12/11/2020, 6:09 PM PGY-2, [place]

## 2020-12-11 NOTE — Sepsis Progress Note (Signed)
Secure chat with provider. Patient is receiving fluid bolus based off of ideal body weight.

## 2020-12-11 NOTE — Progress Notes (Signed)
Pharmacy Antibiotic Note  Lauren Barber is a 65 y.o. female admitted on 12/11/2020 with sepsis.  Pharmacy has been consulted for vancomycin and cefepime dosing.  Plan: Vancomycin 2000 mg IV x 1, then variable dosing due to unstable renal function Cefepime 2g IV every 24h Monitor renal function, Cx and clinical progression to narrow Vancomycin level as needed  Height: 5\' 9"  (175.3 cm) Weight: 134.3 kg (296 lb) IBW/kg (Calculated) : 66.2  Temp (24hrs), Avg:98.5 F (36.9 C), Min:98.5 F (36.9 C), Max:98.5 F (36.9 C)  Recent Labs  Lab 12/07/20 1108 12/11/20 1244 12/11/20 1313  WBC 7.8  --  7.7  CREATININE 2.76* 5.53*  --   LATICACIDVEN  --   --  1.4    Estimated Creatinine Clearance: 15.6 mL/min (A) (by C-G formula based on SCr of 5.53 mg/dL (H)).    Allergies  Allergen Reactions  . Nsaids Other (See Comments)    CKD Cr 2  . Shellfish Allergy Other (See Comments)    Per [facility]    Bertis Ruddy, PharmD Clinical Pharmacist ED Pharmacist Phone # [phone] 12/11/2020 1:51 PM

## 2020-12-11 NOTE — ED Notes (Signed)
Pt transported to CT at this time.

## 2020-12-12 DIAGNOSIS — N390 Urinary tract infection, site not specified: Secondary | ICD-10-CM | POA: Diagnosis present

## 2020-12-12 DIAGNOSIS — R4182 Altered mental status, unspecified: Secondary | ICD-10-CM

## 2020-12-12 DIAGNOSIS — D689 Coagulation defect, unspecified: Secondary | ICD-10-CM | POA: Diagnosis present

## 2020-12-12 DIAGNOSIS — Z6841 Body Mass Index (BMI) 40.0 and over, adult: Secondary | ICD-10-CM | POA: Diagnosis not present

## 2020-12-12 DIAGNOSIS — E43 Unspecified severe protein-calorie malnutrition: Secondary | ICD-10-CM | POA: Diagnosis present

## 2020-12-12 DIAGNOSIS — N1832 Chronic kidney disease, stage 3b: Secondary | ICD-10-CM | POA: Diagnosis present

## 2020-12-12 DIAGNOSIS — Z515 Encounter for palliative care: Secondary | ICD-10-CM | POA: Diagnosis not present

## 2020-12-12 DIAGNOSIS — Z20822 Contact with and (suspected) exposure to covid-19: Secondary | ICD-10-CM | POA: Diagnosis present

## 2020-12-12 DIAGNOSIS — N179 Acute kidney failure, unspecified: Secondary | ICD-10-CM

## 2020-12-12 DIAGNOSIS — R6521 Severe sepsis with septic shock: Secondary | ICD-10-CM | POA: Diagnosis present

## 2020-12-12 DIAGNOSIS — G9340 Encephalopathy, unspecified: Secondary | ICD-10-CM

## 2020-12-12 DIAGNOSIS — Z66 Do not resuscitate: Secondary | ICD-10-CM | POA: Diagnosis present

## 2020-12-12 DIAGNOSIS — E039 Hypothyroidism, unspecified: Secondary | ICD-10-CM | POA: Diagnosis present

## 2020-12-12 DIAGNOSIS — K567 Ileus, unspecified: Secondary | ICD-10-CM | POA: Diagnosis not present

## 2020-12-12 DIAGNOSIS — I959 Hypotension, unspecified: Secondary | ICD-10-CM | POA: Diagnosis not present

## 2020-12-12 DIAGNOSIS — I361 Nonrheumatic tricuspid (valve) insufficiency: Secondary | ICD-10-CM | POA: Diagnosis not present

## 2020-12-12 DIAGNOSIS — E1122 Type 2 diabetes mellitus with diabetic chronic kidney disease: Secondary | ICD-10-CM | POA: Diagnosis present

## 2020-12-12 DIAGNOSIS — N171 Acute kidney failure with acute cortical necrosis: Secondary | ICD-10-CM | POA: Diagnosis not present

## 2020-12-12 DIAGNOSIS — D649 Anemia, unspecified: Secondary | ICD-10-CM | POA: Diagnosis present

## 2020-12-12 DIAGNOSIS — R509 Fever, unspecified: Secondary | ICD-10-CM | POA: Diagnosis not present

## 2020-12-12 DIAGNOSIS — M6282 Rhabdomyolysis: Secondary | ICD-10-CM | POA: Diagnosis present

## 2020-12-12 DIAGNOSIS — R319 Hematuria, unspecified: Secondary | ICD-10-CM | POA: Diagnosis not present

## 2020-12-12 DIAGNOSIS — K7200 Acute and subacute hepatic failure without coma: Secondary | ICD-10-CM | POA: Diagnosis not present

## 2020-12-12 DIAGNOSIS — Z7189 Other specified counseling: Secondary | ICD-10-CM | POA: Diagnosis not present

## 2020-12-12 DIAGNOSIS — F32A Depression, unspecified: Secondary | ICD-10-CM | POA: Diagnosis present

## 2020-12-12 DIAGNOSIS — Z1621 Resistance to vancomycin: Secondary | ICD-10-CM | POA: Diagnosis present

## 2020-12-12 DIAGNOSIS — A419 Sepsis, unspecified organism: Secondary | ICD-10-CM | POA: Diagnosis present

## 2020-12-12 DIAGNOSIS — N178 Other acute kidney failure: Secondary | ICD-10-CM | POA: Diagnosis not present

## 2020-12-12 DIAGNOSIS — L89159 Pressure ulcer of sacral region, unspecified stage: Secondary | ICD-10-CM | POA: Diagnosis present

## 2020-12-12 DIAGNOSIS — E875 Hyperkalemia: Secondary | ICD-10-CM | POA: Diagnosis present

## 2020-12-12 DIAGNOSIS — E870 Hyperosmolality and hypernatremia: Secondary | ICD-10-CM | POA: Diagnosis present

## 2020-12-12 DIAGNOSIS — N170 Acute kidney failure with tubular necrosis: Secondary | ICD-10-CM | POA: Diagnosis present

## 2020-12-12 DIAGNOSIS — N3001 Acute cystitis with hematuria: Secondary | ICD-10-CM | POA: Diagnosis not present

## 2020-12-12 DIAGNOSIS — G9341 Metabolic encephalopathy: Secondary | ICD-10-CM | POA: Diagnosis present

## 2020-12-12 DIAGNOSIS — I6930 Unspecified sequelae of cerebral infarction: Secondary | ICD-10-CM | POA: Diagnosis not present

## 2020-12-12 LAB — CBG MONITORING, ED: Glucose-Capillary: 88 mg/dL (ref 70–99)

## 2020-12-12 LAB — COMPREHENSIVE METABOLIC PANEL
ALT: 219 U/L — ABNORMAL HIGH (ref 0–44)
ALT: 216 U/L — ABNORMAL HIGH (ref 0–44)
AST: 130 U/L — ABNORMAL HIGH (ref 15–41)
AST: 127 U/L — ABNORMAL HIGH (ref 15–41)
Albumin: 2.8 g/dL — ABNORMAL LOW (ref 3.5–5.0)
Albumin: 2.8 g/dL — ABNORMAL LOW (ref 3.5–5.0)
Alkaline Phosphatase: 136 U/L — ABNORMAL HIGH (ref 38–126)
Alkaline Phosphatase: 146 U/L — ABNORMAL HIGH (ref 38–126)
Anion gap: 14 (ref 5–15)
Anion gap: 13 (ref 5–15)
BUN: 144 mg/dL — ABNORMAL HIGH (ref 8–23)
BUN: 138 mg/dL — ABNORMAL HIGH (ref 8–23)
CO2: 15 mmol/L — ABNORMAL LOW (ref 22–32)
CO2: 16 mmol/L — ABNORMAL LOW (ref 22–32)
Calcium: 9.5 mg/dL (ref 8.9–10.3)
Calcium: 9.6 mg/dL (ref 8.9–10.3)
Chloride: 114 mmol/L — ABNORMAL HIGH (ref 98–111)
Chloride: 116 mmol/L — ABNORMAL HIGH (ref 98–111)
Creatinine, Ser: 4.16 mg/dL — ABNORMAL HIGH (ref 0.44–1.00)
Creatinine, Ser: 3.69 mg/dL — ABNORMAL HIGH (ref 0.44–1.00)
GFR, Estimated: 12 mL/min — ABNORMAL LOW (ref >60)
GFR, Estimated: 13 mL/min — ABNORMAL LOW (ref >60)
Glucose, Bld: 97 mg/dL (ref 70–99)
Glucose, Bld: 97 mg/dL (ref 70–99)
Potassium: 4.8 mmol/L (ref 3.5–5.1)
Potassium: 4.7 mmol/L (ref 3.5–5.1)
Sodium: 143 mmol/L (ref 135–145)
Sodium: 145 mmol/L (ref 135–145)
Total Bilirubin: 0.6 mg/dL (ref 0.3–1.2)
Total Bilirubin: 0.7 mg/dL (ref 0.3–1.2)
Total Protein: 7.6 g/dL (ref 6.5–8.1)
Total Protein: 7.8 g/dL (ref 6.5–8.1)

## 2020-12-12 LAB — PHOSPHORUS: Phosphorus: 5.3 mg/dL — ABNORMAL HIGH (ref 2.5–4.6)

## 2020-12-12 LAB — CBC
HCT: 35.6 % — ABNORMAL LOW (ref 36.0–46.0)
Hemoglobin: 10.6 g/dL — ABNORMAL LOW (ref 12.0–15.0)
MCH: 29.6 pg (ref 26.0–34.0)
MCHC: 29.8 g/dL — ABNORMAL LOW (ref 30.0–36.0)
MCV: 99.4 fL (ref 80.0–100.0)
Platelets: 175 10*3/uL (ref 150–400)
RBC: 3.58 MIL/uL — ABNORMAL LOW (ref 3.87–5.11)
RDW: 17 % — ABNORMAL HIGH (ref 11.5–15.5)
WBC: 6.6 10*3/uL (ref 4.0–10.5)
nRBC: 0.6 % — ABNORMAL HIGH (ref 0.0–0.2)

## 2020-12-12 LAB — GLUCOSE, CAPILLARY: Glucose-Capillary: 90 mg/dL (ref 70–99)

## 2020-12-12 LAB — TROPONIN I (HIGH SENSITIVITY): Troponin I (High Sensitivity): 65 ng/L — ABNORMAL HIGH (ref <18)

## 2020-12-12 LAB — HEMOGLOBIN A1C
Hgb A1c MFr Bld: 6.2 % — ABNORMAL HIGH (ref 4.8–5.6)
Mean Plasma Glucose: 131.24 mg/dL

## 2020-12-12 LAB — HEPATITIS PANEL, ACUTE
HCV Ab: NONREACTIVE
Hep A IgM: NONREACTIVE
Hep B C IgM: NONREACTIVE
Hepatitis B Surface Ag: NONREACTIVE

## 2020-12-12 LAB — CK: Total CK: 1997 U/L — ABNORMAL HIGH (ref 38–234)

## 2020-12-12 LAB — HIV ANTIBODY (ROUTINE TESTING W REFLEX): HIV Screen 4th Generation wRfx: NONREACTIVE

## 2020-12-12 MED ORDER — ZINC OXIDE 40 % EX OINT
TOPICAL_OINTMENT | Freq: Two times a day (BID) | CUTANEOUS | Status: DC
  Administered 2020-12-16: 1 via TOPICAL
  Filled 2020-12-12 (×2): qty 57

## 2020-12-12 NOTE — ED Notes (Signed)
Patient CBG was 88.

## 2020-12-12 NOTE — Consult Note (Signed)
WOC Nurse Consult Note: Reason for Consult: Consult requested for bilat buttocks and sacrum Wound type: Bilat buttocks with pink dry intact scar tissue from previous wounds which have healed. Pt is frequently incontinent of stool and urine and it is difficult to keep the affected areas from becoming soiled.  Right inner buttock appearance and location are consistent with moisture associated skin damage. Patchy areas of red moist partial thickness skin loss; each is approx .2X.2X.1cm or smaller in size.  These are NOT pressure injuries. Dressing procedure/placement/frequency: Topical treatment orders provided for bedside nurses to perform as follows to repel moisture and protect from further injury;  Apply Desitin to bilat buttocks and sacrum BID and PRN when cleaning Please re-consult if further assistance is needed.  Thank-you,  Julien Girt MSN, [place], [employer], [place], [place]

## 2020-12-12 NOTE — Evaluation (Addendum)
Physical Therapy Evaluation and Discharge Patient Details Name: Lauren Barber MRN: [id number] DOB: Oct 18, 1956 Today's Date: 12/12/2020   History of Present Illness  Pt is a 65 y/o female admitted from SNF secondary to ARF, AMS, and UTI. PMH includes DM, CVA, HTN, gout, and sacral wounds.  Clinical Impression  Pt admitted secondary to problem above with deficits below. Pt moaning in pain with all mobility tasks. Total A for rolling this session. Pt from SNF and per notes bed bound at baseline. Recommend return to SNF. Since pt likely close to baseline, no further acute PT needs. Will sign off. If needs change, please re-consult.     Follow Up Recommendations SNF;Supervision/Assistance - 24 hour    Equipment Recommendations  None recommended by PT    Recommendations for Other Services       Precautions / Restrictions Precautions Precautions: Fall Restrictions Weight Bearing Restrictions: No      Mobility  Bed Mobility Overal bed mobility: Needs Assistance Bed Mobility: Rolling Rolling: Total assist         General bed mobility comments: Total A for bed mobility tasks. Pt moaning in pain with rolling, so further mobility deferred.    Transfers                    Ambulation/Gait                Stairs            Wheelchair Mobility    Modified Rankin (Stroke Patients Only)       Balance                                             Pertinent Vitals/Pain Pain Assessment: Faces Faces Pain Scale: Hurts whole lot Pain Location: back, sacral area Pain Descriptors / Indicators: Aching;Guarding;Grimacing;Moaning Pain Intervention(s): Limited activity within patient's tolerance;Monitored during session;Repositioned    Home Living Family/patient expects to be discharged to:: Skilled nursing facility                      Prior Function Level of Independence: Needs assistance   Gait / Transfers Assistance Needed:  Reports she stays in the bed mostly. If she does get out of bed, its with hoyer.  ADL's / Homemaking Assistance Needed: Total A        Hand Dominance        Extremity/Trunk Assessment   Upper Extremity Assessment Upper Extremity Assessment: LUE deficits/detail;RUE deficits/detail;Defer to OT evaluation RUE Deficits / Details: Unable to move fingers bilaterally. Moaning in pain when attempting to perform ROM. LUE Deficits / Details: Unable to move fingers bilaterally. Moaning in pain when attempting to perform ROM.    Lower Extremity Assessment Lower Extremity Assessment: RLE deficits/detail;LLE deficits/detail RLE Deficits / Details: Pt not moving her legs actively. PF contractures noted. PROM at knee and hip WFL. LLE Deficits / Details: Pt not moving her legs actively. PF contractures noted. PROM at knee and hip WFL.       Communication   Communication: Expressive difficulties (soft spoken with slurred speech)  Cognition Arousal/Alertness: Awake/alert Behavior During Therapy: Flat affect Overall Cognitive Status: No family/caregiver present to determine baseline cognitive functioning  General Comments: Pt with slurred speech and slow to respond to questions. Difficult to understand at times.      General Comments      Exercises     Assessment/Plan    PT Assessment All further PT needs can be met in the next venue of care;Patent does not need any further PT services  PT Problem List Decreased strength;Decreased activity tolerance;Decreased balance;Decreased mobility;Decreased cognition;Decreased safety awareness;Decreased knowledge of use of DME;Decreased knowledge of precautions       PT Treatment Interventions      PT Goals (Current goals can be found in the Care Plan section)  Acute Rehab PT Goals PT Goal Formulation: Patient unable to participate in goal setting Time For Goal Achievement: 12/12/20 Potential to  Achieve Goals: Fair    Frequency     Barriers to discharge        Co-evaluation               AM-PAC PT "6 Clicks" Mobility  Outcome Measure Help needed turning from your back to your side while in a flat bed without using bedrails?: Total Help needed moving from lying on your back to sitting on the side of a flat bed without using bedrails?: Total Help needed moving to and from a bed to a chair (including a wheelchair)?: Total Help needed standing up from a chair using your arms (e.g., wheelchair or bedside chair)?: Total Help needed to walk in hospital room?: Total Help needed climbing 3-5 steps with a railing? : Total 6 Click Score: 6    End of Session   Activity Tolerance: Patient limited by pain Patient left: in bed;with call bell/phone within reach (on stretcher in ED) Nurse Communication: Mobility status PT Visit Diagnosis: Difficulty in walking, not elsewhere classified (R26.2)    Time: 3142-7670 PT Time Calculation (min) (ACUTE ONLY): 10 min   Charges:   PT Evaluation $PT Eval Moderate Complexity: 1 Mod          Lou Miner, DPT  Acute Rehabilitation Services  Pager: [phone] Office: [phone]   Rudean Hitt 12/12/2020, 10:37 AM

## 2020-12-12 NOTE — ED Notes (Signed)
Bladder scan volume is 167ml

## 2020-12-12 NOTE — Progress Notes (Addendum)
Family Medicine Teaching Service Daily Progress Note Intern Pager: [phone]  Patient name: Lauren Barber Medical record number: [id number] Date of birth: 1956-04-04 Age: 65 y.o. Gender: female  Primary Care Provider: Salome Arnt, [place] Consultants: Nephrology Code Status: Full  Pt Overview and Major Events to Date:  1/23: Admitted  Assessment and Plan:  Lauren Barber is a 65 y.o. female presenting from [facility] with altered mental status. PMH is significant for CKD, T2DM, depression, GERD, hyperlipidemia, hypothyroidism, history of CVA, sacral ulcers.  Altered mental status Patient still unable to provide any history, but will follow commands and can nod yes/no and groan single words. Etiology thought to be metabolic encephalopathy 2/2 acute renal failure.  Ammonia minimally elevated at 37 and anion gap normalized at 15 this morning. Possibly component of hepatic encephalopathy as well (LFTs elevated). CT head was unremarkable. -Nephro following, appreciate recommendations -NPO due to altered mental status -Consider MRI if persistently altered despite improvement in metabolic derangements -PT/OT eval as appropriate  Acute renal failure on CKD  Hyperphosphatemia Gradually improving. Cr 4.16 this morning (from 5.54 yesterday on admission). Thought to be pre-renal etiology. CK [id number] this morning. Phos elevated to 6.5. -Nephrology following, appreciate recommendations -pm CMP -Continue IV fluids (on sodium bicarb gtt currently. Consider switching to D5NS at some point)  Urinary Tract Infection Does not meet sepsis criteria. Afebrile, vitals wnl, normal WBC. Urine culture growing gram negative rods. -d/c vanc -Continue cefepime -f/u urine culture results  Hyperkalemia Resolved. K 4.8 this morning (from 6.1 on admission). S/p insulin and dextrose in the ED, has been on sodium bicarb gtt. -Monitor with CMP this afternoon and tomorrow am  Sacral Skin Changes Patient was  evaluated by wound care. Her sacral area has skin changes consistent with scar tissue. No active wounds at this time. -Desitin prn -Keep area clean and dry  T2DM Very well controlled- A1c 6.2%. Glucose 88 this morning as patient has been NPO for at least 24h. Home meds: Januvia and Trulicity -Holding home meds -Consider initiating dextrose containing fluids and SSI if she remains NPO tomorrow  History of CVA  Hyperlipidemia Patient bedbound at baseline 2/2 deficits from prior stroke. On Atorvastatin 20mg  daily and ASA 81 mg daily at home. Also on tizanidine 2mg  QID for spasticity -Holding home meds due to altered mental status  Hypertension BP has been normotensive over past 24h. Takes torsemide 10mg  daily and spironolactone 25mg  daily at home. -Holding home meds in the setting of normal BP, acute renal failure, and altered mental status  Depression Home meds include fluoxetine 50mg  daily and desvenlafaxine 100mg  daily -Holding home meds due to altered mental status  Gout Chronic, stable. Home medications include colchicine 0.6 mg BID and allopurinol 200 mg/day. -Holding home medications due to altered mental status  GERD Chronic, stable. Home medications include Pepcid 20 mg/day -Holding home medications due to altered mental status  Hypothyroidism Home medications include Synthroid 50 mcg daily. TSH wnl on admission (2.832). -Holding home medications due to altered mental status  FEN/GI: NPO due to altered mental status PPx: Heparin   Status is: Observation The patient will require care spanning > 2 midnights and should be moved to inpatient because: Persistent severe electrolyte disturbances and Altered mental status  Dispo: The patient is from: SNF              Anticipated d/c is to: SNF              Anticipated d/c date is: 3 days  Patient currently is not medically stable to d/c.   Difficult to place patient No   Subjective:  No acute events  overnight. Patient slightly more alert this morning but still extremely altered and unable to provide any history. Seems to be complaining of R shoulder pain, although she groans nonspecifically on exam so it's difficult to assess.  Objective: Temp:  [97.7 F (36.5 C)-98.5 F (36.9 C)] 97.7 F (36.5 C) (01/24 0527) Pulse Rate:  [92-108] 97 (01/24 0315) Resp:  [11-23] 12 (01/24 0315) BP: (70-114)/(45-82) 112/77 (01/24 0315) SpO2:  [96 %-100 %] 98 % (01/24 0315) Weight:  [134.3 kg] 134.3 kg (01/23 1139) Physical Exam: General: obese female laying still in bed Eyes: PERRL Cardiovascular: RRR, normal S1/S2  Respiratory: normal WOB, lungs CTA Abdomen: obese, soft, nondistended Neuro: follows some very basic commands (will open eyes), can nod yes/no. Groans to painful stimuli. Can state her name. Otherwise unresponsive.  Laboratory: Recent Labs  Lab 12/07/20 1108 12/11/20 1313 12/12/20 0528  WBC 7.8 7.7 6.6  HGB 12.1 12.1 10.6*  HCT 38.4 39.2 35.6*  PLT 255 233 175   Recent Labs  Lab 12/07/20 1108 12/11/20 1244 12/11/20 1313 12/11/20 2304  NA 138 140 141 141  K 5.1 6.5* 6.1* 5.5*  CL 108 110 109 113*  CO2 15* 12* 14* 13*  BUN 96* 161* 159* 155*  CREATININE 2.76* 5.53* 5.54* 4.58*  CALCIUM 10.3 10.3 10.4* 9.2  PROT 8.7* 8.4* 8.8*  --   BILITOT 0.5 0.8 0.5  --   ALKPHOS 152* 172* 174*  --   ALT 160* 266* 274*  --   AST 137* 193* 192*  --   GLUCOSE 104* 112* 109* 96   CK 1,997 Acute hepatitis panel negative  Imaging/Diagnostic Tests: CT Head Wo Contrast Result Date: 12/11/2020 IMPRESSION: 1. No acute intracranial pathology. 2. Age-related atrophy and chronic microvascular ischemic changes. Bilateral basal ganglia old lacunar infarcts.  US RENAL Result Date: 12/11/2020 IMPRESSION: No hydronephrosis. Mild renal cortical thinning. No other explanation for renal insufficiency. Limitations secondary to patient body habitus.    Alcus Dad, MD 12/12/2020, 6:26  AM PGY-1, [place] Intern pager: [phone], text pages welcome

## 2020-12-12 NOTE — Progress Notes (Signed)
[facility] Progress Note   Subjective:   Unable to provide any history this AM.  Remains on hold in ED, no family present.  Called only number for family in chart - Mother [phone] and call couldn't be completed.  Ins yesterday 3.8L/UOP 735mL; afebrile, BPs reasonable.  BUN down from 161 to 144, Cr from 5.5 to 4.16.  Na bicarb gtt currently on.  Renal US R 6.4, L 6.9, cortical thinning, no obstruction.   Objective Vitals:   12/12/20 0200 12/12/20 0315 12/12/20 0527 12/12/20 0615  BP: 110/78 112/77  112/64  Pulse: 97 97  97  Resp: 12 12  12   Temp:   97.7 F (36.5 C)   TempSrc:   Axillary   SpO2: 98% 98%  98%  Weight:      Height:       Physical Exam General: lying flat in bed, briefly arouses to shoulder shake but otherwise sleeping Heart: tachy to low 100s on exam Lungs: normal wob on [place], clear ant  Abdomen: soft, obese Extremities: no edema Neuro:  Will say name but answers no other questions, no clonus at ankles  Additional Objective Labs: Basic Metabolic Panel: Recent Labs  Lab 12/11/20 1244 12/11/20 1313 12/11/20 2304  NA 140 141 141  K 6.5* 6.1* 5.5*  CL 110 109 113*  CO2 12* 14* 13*  GLUCOSE 112* 109* 96  BUN 161* 159* 155*  CREATININE 5.53* 5.54* 4.58*  CALCIUM 10.3 10.4* 9.2  PHOS  --   --  6.5*   Liver Function Tests: Recent Labs  Lab 12/07/20 1108 12/11/20 1244 12/11/20 1313 12/11/20 2304  AST 137* 193* 192*  --   ALT 160* 266* 274*  --   ALKPHOS 152* 172* 174*  --   BILITOT 0.5 0.8 0.5  --   PROT 8.7* 8.4* 8.8*  --   ALBUMIN 3.6 3.4* 3.5 2.8*   No results for input(s): LIPASE, AMYLASE in the last 168 hours. CBC: Recent Labs  Lab 12/07/20 1108 12/11/20 1313 12/12/20 0528  WBC 7.8 7.7 6.6  NEUTROABS  --  4.9  --   HGB 12.1 12.1 10.6*  HCT 38.4 39.2 35.6*  MCV 97.7 96.8 99.4  PLT 255 233 175   Blood Culture    Component Value Date/Time   SDES URINE, RANDOM 12/07/2020 1847   SPECREQUEST  12/07/2020 1847     NONE Performed at [facility], [place] [address], [place], [place] [address]    CULT MULTIPLE SPECIES PRESENT, SUGGEST RECOLLECTION (A) 12/07/2020 1847   REPTSTATUS 12/09/2020 FINAL 12/07/2020 1847    Cardiac Enzymes: Recent Labs  Lab 12/11/20 1313  CKTOTAL 2,474*   CBG: Recent Labs  Lab 12/11/20 1324 12/11/20 1621  GLUCAP 108* 134*   Iron Studies: No results for input(s): IRON, TIBC, TRANSFERRIN, FERRITIN in the last 72 hours. @lablastinr3 @ Studies/Results: CT Head Wo Contrast  Result Date: 12/11/2020 CLINICAL DATA:  65 year old female with delirium. EXAM: CT HEAD WITHOUT CONTRAST TECHNIQUE: Contiguous axial images were obtained from the base of the skull through the vertex without intravenous contrast. COMPARISON:  Head CT dated 12/07/2020. FINDINGS: Brain: Mild age-related atrophy and moderate chronic microvascular ischemic changes. Bilateral basal ganglia old lacunar infarcts. There is no acute intracranial hemorrhage. No mass effect or midline shift. No extra-axial fluid collection. Vascular: No hyperdense vessel or unexpected calcification. Skull: Normal. Negative for fracture or focal lesion. Sinuses/Orbits: Mild mucoperiosteal thickening of paranasal sinuses. No air-fluid level. The mastoid air cells are clear. Other:  None IMPRESSION: 1. No acute intracranial pathology. 2. Age-related atrophy and chronic microvascular ischemic changes. Bilateral basal ganglia old lacunar infarcts. Electronically Signed   By: Anner Crete M.D.   On: 12/11/2020 16:47   US RENAL  Result Date: 12/11/2020 CLINICAL DATA:  Acute renal insufficiency EXAM: RENAL / URINARY TRACT ULTRASOUND COMPLETE COMPARISON:  CT 12/06/2020 FINDINGS: Right Kidney: Renal measurements: 6.4 x 4.7 x 5.4 cm = volume: 137 mL. Echogenicity within normal limits. No mass or hydronephrosis visualized. Mild renal cortical thinning. Left Kidney: Renal measurements: 6.9 x 4.9 x 4.2 cm = volume: 118 mL. Echogenicity within  normal limits. No mass or hydronephrosis visualized. Mild renal cortical thinning. Bladder: Grossly within normal limits. Other: Exam is degraded by patient body habitus. note is made of a simple cystic lesion within the left adnexa including at 4.9 cm. This is incompletely evaluated on this nondedicated exam. IMPRESSION: No hydronephrosis. Mild renal cortical thinning. No other explanation for renal insufficiency. Limitations secondary to patient body habitus. Electronically Signed   By: Abigail Miyamoto M.D.   On: 12/11/2020 18:03   DG Chest Port 1 View  Result Date: 12/11/2020 CLINICAL DATA:  Question sepsis. EXAM: PORTABLE CHEST 1 VIEW COMPARISON:  December 07, 2020 FINDINGS: The mediastinal contour is stable. The heart size is enlarged. There is no focal infiltrate, pulmonary edema, or pleural effusion. Mild atelectasis of the left mid lung is noted. The bony structures are normal. IMPRESSION: No focal pneumonia. Electronically Signed   By: Abelardo Diesel M.D.   On: 12/11/2020 13:27   Medications: . ceFEPime (MAXIPIME) IV    . sodium bicarbonate in 1/4 NS 1000 mL infusion 125 mL/hr at 12/11/20 2301   . heparin  5,000 Units Subcutaneous Q8H  . vancomycin variable dose per unstable renal function (pharmacist dosing)   Does not apply See admin instructions    Assessment/Plan: **AKI on CKD 4:  Severe AKI in setting of UTI and AMS x 1 week prior ? Prerenal with poor po intake.  CK modestly elevated as well.  Renal US without obstruction; kidneys small.  Last Cr for comparison 09/2017 2.1.  Cr 5.5 on presentation and thankfully is improving with supportive care.  She seems to be a poor candidate for long term dialysis with SNF bound and numerous comorbids.  I was unable to discuss with family.  For now would continue supportive care and hopefully BUN/Cr will continue to improve.  Certainly part of her mental status could be uremia but I'd like to watch for now.   **AMS: Per above, I suspect at least in  part uremia but given improving parameters and poor dialysis candidate I will follow for now.  Primary holding neuroactive meds currently too and treating underlying infection.   **UTI: abx per primary.   **Metabolic acidosis:  Na bicarb gtt. Slowly improving.   **Hyperkalemia: resolved this AM.   **Anemia: mild, no need for ESA.   Will follow.  Jannifer Hick MD 12/12/2020, 6:55 AM  [facility] Pager: [phone]

## 2020-12-12 NOTE — Progress Notes (Signed)
FPTS Interim Progress Note  S: Evaluated patient at bedside.  When asked if she had any pain and where, she replied "all over."  O: BP 99/65 (BP Location: Right Arm)   Pulse (!) 108   Temp (!) 97.5 F (36.4 C) (Oral)   Resp 18   Ht 5\' 9"  (1.753 m)   Wt 113 kg   SpO2 100%   BMI 36.79 kg/m   General: Lethargic obese female laying still in bed CV: Tachycardic, regular rhythm Resp: CTAB, breathing comfortably on room air Neuro: Lethargic, oriented x2 (able to state name and "[place]," unable to make out response for year), speech difficult to understand, opens eyes on command, inattention noted  A/P: Altered mental status Likely uremia in the setting of acute renal failure. Mental status appears to be overall improved from earlier today. No changes to current plan.  Zola Button, MD 12/12/2020, 10:52 PM PGY-1, [place] Medicine Service pager [phone]

## 2020-12-13 ENCOUNTER — Inpatient Hospital Stay (HOSPITAL_COMMUNITY): Payer: Medicare HMO

## 2020-12-13 DIAGNOSIS — N179 Acute kidney failure, unspecified: Secondary | ICD-10-CM | POA: Diagnosis not present

## 2020-12-13 DIAGNOSIS — R4182 Altered mental status, unspecified: Secondary | ICD-10-CM | POA: Diagnosis not present

## 2020-12-13 LAB — COMPREHENSIVE METABOLIC PANEL
ALT: 153 U/L — ABNORMAL HIGH (ref 0–44)
AST: 82 U/L — ABNORMAL HIGH (ref 15–41)
Albumin: 2.2 g/dL — ABNORMAL LOW (ref 3.5–5.0)
Alkaline Phosphatase: 116 U/L (ref 38–126)
Anion gap: 14 (ref 5–15)
BUN: 119 mg/dL — ABNORMAL HIGH (ref 8–23)
CO2: 24 mmol/L (ref 22–32)
Calcium: 8.1 mg/dL — ABNORMAL LOW (ref 8.9–10.3)
Chloride: 107 mmol/L (ref 98–111)
Creatinine, Ser: 2.85 mg/dL — ABNORMAL HIGH (ref 0.44–1.00)
GFR, Estimated: 18 mL/min — ABNORMAL LOW (ref >60)
Glucose, Bld: 84 mg/dL (ref 70–99)
Potassium: 3.8 mmol/L (ref 3.5–5.1)
Sodium: 145 mmol/L (ref 135–145)
Total Bilirubin: 0.7 mg/dL (ref 0.3–1.2)
Total Protein: 6.1 g/dL — ABNORMAL LOW (ref 6.5–8.1)

## 2020-12-13 LAB — GLUCOSE, CAPILLARY
Glucose-Capillary: 100 mg/dL — ABNORMAL HIGH (ref 70–99)
Glucose-Capillary: 104 mg/dL — ABNORMAL HIGH (ref 70–99)
Glucose-Capillary: 90 mg/dL (ref 70–99)
Glucose-Capillary: 94 mg/dL (ref 70–99)
Glucose-Capillary: 99 mg/dL (ref 70–99)
Glucose-Capillary: 99 mg/dL (ref 70–99)

## 2020-12-13 LAB — HEPATITIS B SURFACE ANTIBODY, QUANTITATIVE: Hep B S AB Quant (Post): <3.1 m[IU]/mL — ABNORMAL LOW (ref >9.9)

## 2020-12-13 MED ORDER — SODIUM CHLORIDE 0.9 % IV SOLN
INTRAVENOUS | Status: DC | PRN
  Administered 2020-12-13 – 2020-12-15 (×2): 250 mL via INTRAVENOUS

## 2020-12-13 MED ORDER — INSULIN ASPART 100 UNIT/ML ~~LOC~~ SOLN: 0.0000 [IU] | Freq: Three times a day (TID) | SUBCUTANEOUS | Status: DC

## 2020-12-13 MED ORDER — PIPERACILLIN-TAZOBACTAM 3.375 G IVPB
3.3750 g | Freq: Three times a day (TID) | INTRAVENOUS | Status: DC
  Administered 2020-12-13 – 2020-12-17 (×13): 3.375 g via INTRAVENOUS
  Filled 2020-12-13 (×12): qty 50

## 2020-12-13 MED ORDER — GADOBUTROL 1 MMOL/ML IV SOLN
10.0000 mL | Freq: Once | INTRAVENOUS | Status: AC | PRN
  Administered 2020-12-13: 10 mL via INTRAVENOUS

## 2020-12-13 MED ORDER — SODIUM CHLORIDE 0.9 % IV SOLN: INTRAVENOUS | Status: DC

## 2020-12-13 NOTE — Progress Notes (Signed)
[facility] Progress Note   Subjective:   Awake, sitting up in bed alert today.  Says mouth dry.  Ins yesterday 3.1L/UOP 836mL; afebrile, BPs 90-100s.  BUN down from 161 to 144 to 119, Cr from 5.5 to 4.16 tp 2.85.  Na bicarb gtt currently on, serum bicarb 24.     Objective Vitals:   12/13/20 0038 12/13/20 0500 12/13/20 0554 12/13/20 0600  BP: 101/63 99/68  101/66  Pulse: (!) 108 (!) 101  100  Resp: 19 18 15 17   Temp: (!) 97.5 F (36.4 C) (!) 97.1 F (36.2 C)  98 F (36.7 C)  TempSrc: Oral Oral  Oral  SpO2:  100%  100%  Weight: 113.7 kg 113.3 kg    Height:       Physical Exam General: sitting up alert in bed Heart: tachy to low 100s on exam, no rub Lungs: normal wob on [place], clear ant  Abdomen: soft, obese Extremities: no edema Neuro:  Awake, alert; conversant but having difficulty with orientation questions. Much improved c/w yesterday.  Additional Objective Labs: Basic Metabolic Panel: Recent Labs  Lab 12/11/20 2304 12/12/20 0632 12/12/20 1547 12/13/20 0427  NA 141 143 145 145  K 5.5* 4.8 4.7 3.8  CL 113* 114* 116* 107  CO2 13* 15* 16* 24  GLUCOSE 96 97 97 84  BUN 155* 144* 138* 119*  CREATININE 4.58* 4.16* 3.69* 2.85*  CALCIUM 9.2 9.5 9.6 8.1*  PHOS 6.5*  --  5.3*  --    Liver Function Tests: Recent Labs  Lab 12/12/20 0632 12/12/20 1547 12/13/20 0427  AST 130* 127* 82*  ALT 219* 216* 153*  ALKPHOS 136* 146* 116  BILITOT 0.6 0.7 0.7  PROT 7.6 7.8 6.1*  ALBUMIN 2.8* 2.8* 2.2*   No results for input(s): LIPASE, AMYLASE in the last 168 hours. CBC: Recent Labs  Lab 12/07/20 1108 12/11/20 1313 12/12/20 0528  WBC 7.8 7.7 6.6  NEUTROABS  --  4.9  --   HGB 12.1 12.1 10.6*  HCT 38.4 39.2 35.6*  MCV 97.7 96.8 99.4  PLT 255 233 175   Blood Culture    Component Value Date/Time   SDES IN/OUT CATH URINE 12/11/2020 1219   SPECREQUEST  12/11/2020 1219    NONE Performed at [facility], [place] [address], [place], [place]  [address]    CULT >=100,000 COLONIES/mL GRAM NEGATIVE RODS (A) 12/11/2020 1219   REPTSTATUS PENDING 12/11/2020 1219    Cardiac Enzymes: Recent Labs  Lab 12/11/20 1313 12/12/20 0632  CKTOTAL 2,474* 1,997*   CBG: Recent Labs  Lab 12/11/20 1621 12/12/20 0807 12/12/20 2246 12/13/20 0045 12/13/20 0506  GLUCAP 134* 88 90 94 99   Iron Studies: No results for input(s): IRON, TIBC, TRANSFERRIN, FERRITIN in the last 72 hours. @lablastinr3 @ Studies/Results: CT Head Wo Contrast  Result Date: 12/11/2020 CLINICAL DATA:  65 year old female with delirium. EXAM: CT HEAD WITHOUT CONTRAST TECHNIQUE: Contiguous axial images were obtained from the base of the skull through the vertex without intravenous contrast. COMPARISON:  Head CT dated 12/07/2020. FINDINGS: Brain: Mild age-related atrophy and moderate chronic microvascular ischemic changes. Bilateral basal ganglia old lacunar infarcts. There is no acute intracranial hemorrhage. No mass effect or midline shift. No extra-axial fluid collection. Vascular: No hyperdense vessel or unexpected calcification. Skull: Normal. Negative for fracture or focal lesion. Sinuses/Orbits: Mild mucoperiosteal thickening of paranasal sinuses. No air-fluid level. The mastoid air cells are clear. Other: None IMPRESSION: 1. No acute intracranial pathology. 2. Age-related atrophy  and chronic microvascular ischemic changes. Bilateral basal ganglia old lacunar infarcts. Electronically Signed   By: Anner Crete M.D.   On: 12/11/2020 16:47   US RENAL  Result Date: 12/11/2020 CLINICAL DATA:  Acute renal insufficiency EXAM: RENAL / URINARY TRACT ULTRASOUND COMPLETE COMPARISON:  CT 12/06/2020 FINDINGS: Right Kidney: Renal measurements: 6.4 x 4.7 x 5.4 cm = volume: 137 mL. Echogenicity within normal limits. No mass or hydronephrosis visualized. Mild renal cortical thinning. Left Kidney: Renal measurements: 6.9 x 4.9 x 4.2 cm = volume: 118 mL. Echogenicity within normal limits. No  mass or hydronephrosis visualized. Mild renal cortical thinning. Bladder: Grossly within normal limits. Other: Exam is degraded by patient body habitus. note is made of a simple cystic lesion within the left adnexa including at 4.9 cm. This is incompletely evaluated on this nondedicated exam. IMPRESSION: No hydronephrosis. Mild renal cortical thinning. No other explanation for renal insufficiency. Limitations secondary to patient body habitus. Electronically Signed   By: Abigail Miyamoto M.D.   On: 12/11/2020 18:03   DG Chest Port 1 View  Result Date: 12/11/2020 CLINICAL DATA:  Question sepsis. EXAM: PORTABLE CHEST 1 VIEW COMPARISON:  December 07, 2020 FINDINGS: The mediastinal contour is stable. The heart size is enlarged. There is no focal infiltrate, pulmonary edema, or pleural effusion. Mild atelectasis of the left mid lung is noted. The bony structures are normal. IMPRESSION: No focal pneumonia. Electronically Signed   By: Abelardo Diesel M.D.   On: 12/11/2020 13:27   Medications: . ceFEPime (MAXIPIME) IV Stopped (12/12/20 1617)  . sodium bicarbonate in 1/4 NS 1000 mL infusion 125 mL/hr at 12/13/20 0559   . heparin  5,000 Units Subcutaneous Q8H  . liver oil-zinc oxide   Topical BID    Assessment/Plan: **AKI on CKD 4:  Severe AKI in setting of UTI and AMS x 1 week prior ? Prerenal with poor po intake.  CK modestly elevated as well.  Renal US without obstruction; kidneys small.  Last Cr for comparison 09/2017 2.1.  Cr 5.5 on presentation and thankfully is improving substantially with supportive care. D/C bicarb gtt and replace with NS MIVF.   She seems to be a poor candidate for long term dialysis with SNF bound and numerous comorbids. For now would continue supportive care and hopefully BUN/Cr will continue to improve.  Certainly part of her mental status could be uremia but I'd like to watch for now given improving.  I don't see that she has a nephrologist - I can follow her longitudinally on discharge.    **AMS: Per above, I suspect at least in part uremia but given improving parameters and poor dialysis candidate I will follow for now.  Primary holding neuroactive meds currently too and treating underlying infection.   She is greatly improved compared to yesterday morning.  **UTI: abx per primary.   **Metabolic acidosis:  Na bicarb gtt - d/c today.   **Hyperkalemia: resolved  **Anemia: mild in 10s, no need for ESA.   Will follow.  Jannifer Hick MD 12/13/2020, 8:38 AM  [facility] Pager: [phone]

## 2020-12-13 NOTE — Progress Notes (Addendum)
Family Medicine Teaching Service Daily Progress Note Intern Pager: [phone]  Patient name: Lauren Barber Medical record number: [id number] Date of birth: 1956/08/23 Age: 65 y.o. Gender: female  Primary Care Provider: Salome Arnt, [place] Consultants: Nephrology Code Status: Full  Pt Overview and Major Events to Date:  1/23: Admitted  Assessment and Plan:  Lauren Barber is a 65 y.o. female presenting from [place] with altered mental status. PMH is significant for CKD, T2DM, depression, GERD, hyperlipidemia, hypothyroidism, history of CVA, sacral ulcers.  Altered mental status Patient remains significantly altered this morning- is minimally responsive other than groaning. She is alert and oriented x4 at baseline. Etiology unclear at this point-- renal failure is improving with supportive care, liver enzymes are downtrending, electrolytes wnl, head CT unremarkable but she remains altered. She is not receiving any meds so do not suspect pharmacologic etiology. -Will obtain MRI Brain -Nephro following, appreciate recommendations -NPO due to altered mental status -PT/OT eval and treat as appropriate  Acute renal failure on CKD  Hyperphosphatemia Improving. Cr 2.85 this morning (from 5.54 on admission). Thought to be pre-renal etiology. Metabolic acidosis has resolved (anion gap 14). Phos improving as well- 5.3 yesterday afternoon. -Nephrology following, appreciate recommendations -Off bicarb gtt -NS @ 100 mL/hr  Urinary Tract Infection Does not meet sepsis criteria. Afebrile, vitals wnl, normal WBC. Urine culture growing >100,000 pseudomonas and 50,000 enterococcus. -Will d/c cefepime and switch to Zosyn -f/u susceptibilities  Sacral Skin Changes Patient was evaluated by wound care. Her sacral area has skin changes consistent with scar tissue. No active wounds at this time. -Desitin prn -Keep area clean and dry  T2DM Very well controlled- A1c 6.2%. Glucose 99 this morning.  Patient has remained NPO since admission due to altered mental status. Home meds: Januvia and Trulicity -Holding home meds -CBG monitoring  History of CVA  Hyperlipidemia Patient bedbound at baseline 2/2 deficits from prior stroke. On Atorvastatin 20mg  daily and ASA 81 mg daily at home. Also on tizanidine 2mg  QID for spasticity -Holding home meds due to altered mental status  Hypertension BP has been normotensive over past 24h. Takes torsemide 10mg  daily and spironolactone 25mg  daily at home. -Holding home meds in the setting of normal BP, acute renal failure, and altered mental status  Depression Home meds include fluoxetine 50mg  daily and desvenlafaxine 100mg  daily -Holding home meds due to altered mental status  Gout Chronic, stable. Home medications include colchicine 0.6 mg BID and allopurinol 200 mg/day. -Holding home medications due to altered mental status  GERD Chronic, stable. Home medications include Pepcid 20 mg/day -Holding home medications due to altered mental status  Hypothyroidism Home medications include Synthroid 50 mcg daily. TSH wnl on admission (2.832). -Holding home medications due to altered mental status  FEN/GI: NPO due to altered mental status PPx: Heparin   Status is: Inpatient Remains inpatient appropriate because: Persistent severe electrolyte disturbances and Altered mental status  Dispo: The patient is from: SNF              Anticipated d/c is to: SNF              Anticipated d/c date is: 3 days              Patient currently is not medically stable to d/c.   Difficult to place patient No   Subjective:  No acute events overnight. Patient remains altered and unable to provide any history this morning.   Objective: Temp:  [97.1 F (36.2 C)-98 F (36.7 C)] 98  F (36.7 C) (01/25 0600) Pulse Rate:  [100-111] 100 (01/25 0600) Resp:  [13-19] 17 (01/25 0600) BP: (97-113)/(63-75) 101/66 (01/25 0600) SpO2:  [94 %-100 %] 100 % (01/25  0600) Weight:  [427 kg-113.7 kg] 113.3 kg (01/25 0500) Physical Exam: General: obese female laying still in bed Eyes: PERRL Cardiovascular: mildly tachycardic, normal S1/S2  Respiratory: normal WOB, lungs CTA Abdomen: obese, soft, nondistended, patient groans with palpation of abdomen Neuro: Mumbles few words but otherwise is non-responsive, she is able to follow some basic commands (opens mouth, eyes). Difficult to assess neuro exam due to patient's altered mental status. She groans when I lift either arm. Patient unable to lift any extremities off the bed (does not wiggle fingers or toes)  Laboratory: Recent Labs  Lab 12/07/20 1108 12/11/20 1313 12/12/20 0528  WBC 7.8 7.7 6.6  HGB 12.1 12.1 10.6*  HCT 38.4 39.2 35.6*  PLT 255 233 175   Recent Labs  Lab 12/12/20 0632 12/12/20 1547 12/13/20 0427  NA 143 145 145  K 4.8 4.7 3.8  CL 114* 116* 107  CO2 15* 16* 24  BUN 144* 138* 119*  CREATININE 4.16* 3.69* 2.85*  CALCIUM 9.5 9.6 8.1*  PROT 7.6 7.8 6.1*  BILITOT 0.6 0.7 0.7  ALKPHOS 136* 146* 116  ALT 219* 216* 153*  AST 130* 127* 82*  GLUCOSE 97 97 84    Imaging/Diagnostic Tests: No new imaging in past 24h.    Alcus Dad, MD 12/13/2020, 6:42 AM PGY-1, [place] Intern pager: [phone], text pages welcome

## 2020-12-13 NOTE — Progress Notes (Signed)
Patient more alert, asking for cold water to drink, says her mouth is so dried. Ice chip given, oral care performed, spoke with DR Rock Nephew concerning water and she okay to give sips if patient tolerates well. Water given, patient tolerated well, says she feel better.

## 2020-12-13 NOTE — Progress Notes (Signed)
Pharmacy Antibiotic Note  Lauren Barber is a 65 y.o. female admitted on 12/11/2020 with AMS, found to have a UTI.  Pharmacy has been consulted for Zosyn dosing.  Patient was seen for suspected UTI in ED on 1/19 and prescribed a 7-day course of cephalexin. Patient returned to hospital on 1/23 with altered mental status and was given broad spectrum antibiotics (vancomycin, cefepime, Flagyl). Continued on cefepime yesterday 1/24. Urine cultures returned today 1/25 growing Pseudomonas and Enterococcus faecalis.  Plan:  After discussion with Dr. Rock Nephew and Dr. Gwendlyn Deutscher, antibiotics will be changed to Zosyn 3.375 g IV q8h. Follow up on susceptibility results.  Height: 5\' 9"  (175.3 cm) Weight: 113.3 kg (249 lb 12.5 oz) IBW/kg (Calculated) : 66.2  Temp (24hrs), Avg:97.7 F (36.5 C), Min:97.1 F (36.2 C), Max:98.5 F (36.9 C)  Recent Labs  Lab 12/07/20 1108 12/11/20 1244 12/11/20 1313 12/11/20 2303 12/11/20 2304 12/12/20 0528 12/12/20 0632 12/12/20 1547 12/13/20 0427  WBC 7.8  --  7.7  --   --  6.6  --   --   --   CREATININE 2.76*   < > 5.54*  --  4.58*  --  4.16* 3.69* 2.85*  LATICACIDVEN  --   --  1.4 1.1  --   --   --   --   --    < > = values in this interval not displayed.    Estimated Creatinine Clearance: 27.5 mL/min (A) (by C-G formula based on SCr of 2.85 mg/dL (H)).    Allergies  Allergen Reactions  . Nsaids Other (See Comments)    CKD Cr 2  . Shellfish Allergy Other (See Comments)    Per MAR    Antimicrobials this admission: Vancomycin 1/23 x 1 Metronidazole 1/23 x 1 Cefepime 1/23 >> 1/24 Zosyn 1/25 >>  Microbiology results: 1/23 BCx: no growth to date 1/23 UCx: >100,000 colonies/mL Pseudomonas and 50,000 colonies/mL Enterococcus faecalis  Thank you for allowing pharmacy to be a part of this patient's care.  Esmeralda Links, PharmD Candidate 12/13/2020 12:27 PM

## 2020-12-13 NOTE — Evaluation (Signed)
Occupational Therapy Evaluation Patient Details Name: Lauren Barber MRN: [id number] DOB: 07/25/1956 Today's Date: 12/13/2020    History of Present Illness Pt is a 65 y/o female admitted from SNF secondary to ARF, AMS, and UTI. PMH includes DM, CVA, HTN, gout, and sacral wounds.   Clinical Impression   PTA, pt was living with at [facility] and required assistance for ADLs and used hoyer for transfers OOB; per MD who contacted [facility], pt was able to participate in feeding and conversations. Pt currently requiring Total A for all ADLs and bed mobility. Pt lethargic and poor following of commands. After bed mobility and repositioning to upright posture, pt more awake and verbalizing simple words including yes, no, and "laptop". Pt would benefit from further acute OT to increase occupational particiaption. Recommend dc to SNF for further OT to optimize safety, independence with ADLs, and return to PLOF.     Follow Up Recommendations  SNF    Equipment Recommendations  None recommended by OT    Recommendations for Other Services       Precautions / Restrictions Precautions Precautions: Fall      Mobility Bed Mobility Overal bed mobility: Needs Assistance Bed Mobility: Rolling Rolling: Total assist         General bed mobility comments: Total A for rolling in bed. Pt incontinience of stool    Transfers                      Balance                                           ADL either performed or assessed with clinical judgement   ADL Overall ADL's : Needs assistance/impaired Eating/Feeding: NPO   Grooming: Total assistance;Oral care Grooming Details (indicate cue type and reason): Total A for oral care; pt following cues to open mouth. Attempting hand over hand to bring swab to mouth, but pt unable to maintain grasp and fatigues quickly.                               General ADL Comments: Total A for ADLs and bed  mobility.     Vision         Perception     Praxis      Pertinent Vitals/Pain Pain Assessment: Faces Faces Pain Scale: Hurts whole lot Pain Location: Generalized with movement Pain Descriptors / Indicators: Aching;Guarding;Grimacing;Moaning Pain Intervention(s): Limited activity within patient's tolerance;Monitored during session;Repositioned     Hand Dominance     Extremity/Trunk Assessment Upper Extremity Assessment Upper Extremity Assessment: RUE deficits/detail;LUE deficits/detail RUE Deficits / Details: Unable to move fingers bilaterally. Moaning in pain when attempting to perform ROM. Increased edema and stiffness. RUE Coordination: decreased fine motor;decreased gross motor LUE Deficits / Details: Unable to move fingers bilaterally. Moaning in pain when attempting to perform ROM. Increased edema and stiffness. LUE Coordination: decreased fine motor;decreased gross motor   Lower Extremity Assessment Lower Extremity Assessment: Defer to PT evaluation RLE Deficits / Details: Pt not moving her legs actively. PF contractures noted. PROM at knee and hip WFL. LLE Deficits / Details: Pt not moving her legs actively. PF contractures noted. PROM at knee and hip WFL.   Cervical / Trunk Assessment Cervical / Trunk Assessment: Other exceptions Cervical / Trunk Exceptions: Increased body habitus  Communication Communication Communication: Expressive difficulties (soft spoken with slurred speech)   Cognition Arousal/Alertness: Awake/alert Behavior During Therapy: Flat affect Overall Cognitive Status: No family/caregiver present to determine baseline cognitive functioning                                 General Comments: At first, pt very sleepy and lethargic. Not following commands. After peri care, rolling in bed, and repositioning to upright posture in bed, pt attempting to verbalize sentences. COntinue to be very fatigue and would stand a sentence and then  loose attention. When asking pt about watching TV, she was able to state "laptop" and nodding yes when asked if she watches TV on her laptop at home. Pt abel to say yes or no to certain show on television. During peri care, pt stating "it feels like you're cutting my butt."   General Comments       Exercises     Shoulder Instructions      Home Living Family/patient expects to be discharged to:: Skilled nursing facility                                        Prior Functioning/Environment Level of Independence: Needs assistance  Gait / Transfers Assistance Needed: chart review - bed bound. If she does get out of bed, its with hoyer. ADL's / Homemaking Assistance Needed: Total A for bathing, dressing, and toileting. Per MD (who was in room upon arrival), pt was able to feed her self and maintain conversation per phone call with staff at [facility].            OT Problem List: Decreased strength;Decreased range of motion;Impaired balance (sitting and/or standing);Decreased activity tolerance;Decreased knowledge of use of DME or AE;Decreased safety awareness;Decreased knowledge of precautions;Pain      OT Treatment/Interventions: Self-care/ADL training;Therapeutic exercise;Energy conservation;DME and/or AE instruction;Therapeutic activities;Patient/family education    OT Goals(Current goals can be found in the care plan section) Acute Rehab OT Goals Patient Stated Goal: unstated OT Goal Formulation: Patient unable to participate in goal setting Time For Goal Achievement: 12/27/20 Potential to Achieve Goals: Good  OT Frequency: Min 2X/week   Barriers to D/C:            Co-evaluation              AM-PAC OT "6 Clicks" Daily Activity     Outcome Measure Help from another person eating meals?: Total Help from another person taking care of personal grooming?: Total Help from another person toileting, which includes using toliet, bedpan, or urinal?:  Total Help from another person bathing (including washing, rinsing, drying)?: Total Help from another person to put on and taking off regular upper body clothing?: Total Help from another person to put on and taking off regular lower body clothing?: Total 6 Click Score: 6   End of Session Nurse Communication: Mobility status  Activity Tolerance: Patient limited by fatigue;Patient limited by pain Patient left: in bed;with call bell/phone within reach  OT Visit Diagnosis: Unsteadiness on feet (R26.81);Other abnormalities of gait and mobility (R26.89);Muscle weakness (generalized) (M62.81);Pain Pain - part of body:  (sacral wound)                Time: 9622-2979 OT Time Calculation (min): 38 min Charges:  OT General Charges $OT Visit: 1 Visit OT Evaluation $OT  Eval Moderate Complexity: 1 Mod OT Treatments $Self Care/Home Management : 23-37 mins  Elmon Shader MSOT, OTR/L Acute Rehab Pager: [phone] Office: [place] 12/13/2020, 1:09 PM

## 2020-12-14 DIAGNOSIS — R319 Hematuria, unspecified: Secondary | ICD-10-CM

## 2020-12-14 DIAGNOSIS — N390 Urinary tract infection, site not specified: Secondary | ICD-10-CM

## 2020-12-14 LAB — GLUCOSE, CAPILLARY
Glucose-Capillary: 86 mg/dL (ref 70–99)
Glucose-Capillary: 105 mg/dL — ABNORMAL HIGH (ref 70–99)
Glucose-Capillary: 93 mg/dL (ref 70–99)
Glucose-Capillary: 122 mg/dL — ABNORMAL HIGH (ref 70–99)
Glucose-Capillary: 116 mg/dL — ABNORMAL HIGH (ref 70–99)
Glucose-Capillary: 123 mg/dL — ABNORMAL HIGH (ref 70–99)

## 2020-12-14 LAB — BLOOD CULTURE ID PANEL (REFLEXED) - BCID2

## 2020-12-14 LAB — CBC
HCT: 32 % — ABNORMAL LOW (ref 36.0–46.0)
Hemoglobin: 10.1 g/dL — ABNORMAL LOW (ref 12.0–15.0)
MCH: 30.7 pg (ref 26.0–34.0)
MCHC: 31.6 g/dL (ref 30.0–36.0)
MCV: 97.3 fL (ref 80.0–100.0)
Platelets: 139 10*3/uL — ABNORMAL LOW (ref 150–400)
RBC: 3.29 MIL/uL — ABNORMAL LOW (ref 3.87–5.11)
RDW: 17 % — ABNORMAL HIGH (ref 11.5–15.5)
WBC: 9 10*3/uL (ref 4.0–10.5)
nRBC: 0.7 % — ABNORMAL HIGH (ref 0.0–0.2)

## 2020-12-14 LAB — URINE CULTURE: Culture: >=100000 — AB

## 2020-12-14 LAB — COMPREHENSIVE METABOLIC PANEL
ALT: 145 U/L — ABNORMAL HIGH (ref 0–44)
AST: 70 U/L — ABNORMAL HIGH (ref 15–41)
Albumin: 2.4 g/dL — ABNORMAL LOW (ref 3.5–5.0)
Alkaline Phosphatase: 151 U/L — ABNORMAL HIGH (ref 38–126)
Anion gap: 15 (ref 5–15)
BUN: 133 mg/dL — ABNORMAL HIGH (ref 8–23)
CO2: 16 mmol/L — ABNORMAL LOW (ref 22–32)
Calcium: 9.6 mg/dL (ref 8.9–10.3)
Chloride: 119 mmol/L — ABNORMAL HIGH (ref 98–111)
Creatinine, Ser: 3.11 mg/dL — ABNORMAL HIGH (ref 0.44–1.00)
GFR, Estimated: 16 mL/min — ABNORMAL LOW (ref >60)
Glucose, Bld: 101 mg/dL — ABNORMAL HIGH (ref 70–99)
Potassium: 4.4 mmol/L (ref 3.5–5.1)
Sodium: 150 mmol/L — ABNORMAL HIGH (ref 135–145)
Total Bilirubin: 1.1 mg/dL (ref 0.3–1.2)
Total Protein: 7.3 g/dL (ref 6.5–8.1)

## 2020-12-14 MED ORDER — SODIUM CHLORIDE 0.9 % IV BOLUS
500.0000 mL | Freq: Once | INTRAVENOUS | Status: AC
  Administered 2020-12-14: 500 mL via INTRAVENOUS

## 2020-12-14 MED ORDER — DEXTROSE-NACL 5-0.45 % IV SOLN: INTRAVENOUS | Status: AC

## 2020-12-14 MED ORDER — SODIUM BICARBONATE 650 MG PO TABS: 1300.0000 mg | ORAL_TABLET | Freq: Two times a day (BID) | ORAL | Status: DC

## 2020-12-14 NOTE — Progress Notes (Signed)
PHARMACY - PHYSICIAN COMMUNICATION CRITICAL VALUE ALERT - BLOOD CULTURE IDENTIFICATION (BCID)  Lauren Barber is an 65 y.o. female who presented to [facility] on 12/11/2020 with a chief complaint of AMS found to have Enterococcal/Pseudomonal UTI currently on piperacillin/tazobactam therapy. Her 1/23 blood cultures returned positive for coagulase negative Staph spp in 1/4 bottles likely representing contamination. No changes in therapy required.  Assessment:  Bcx 1/4 Staph spp (no ID on BCID) - likely contamination  Name of physician (or Provider) Contacted: Dr. Gwendlyn Deutscher, Dr. Rock Nephew  Current antibiotics: Piperacillin/tazobactam  Changes to prescribed antibiotics recommended:  Patient is on recommended antibiotics - No changes needed  Results for orders placed or performed during the hospital encounter of 12/11/20  Blood Culture ID Panel (Reflexed) (Collected: 12/11/2020 11:48 AM)  Result Value Ref Range   Enterococcus faecalis NOT DETECTED NOT DETECTED   Enterococcus Faecium NOT DETECTED NOT DETECTED   Listeria monocytogenes NOT DETECTED NOT DETECTED   Staphylococcus species DETECTED (A) NOT DETECTED   Staphylococcus aureus (BCID) NOT DETECTED NOT DETECTED   Staphylococcus epidermidis NOT DETECTED NOT DETECTED   Staphylococcus lugdunensis NOT DETECTED NOT DETECTED   Streptococcus species NOT DETECTED NOT DETECTED   Streptococcus agalactiae NOT DETECTED NOT DETECTED   Streptococcus pneumoniae NOT DETECTED NOT DETECTED   Streptococcus pyogenes NOT DETECTED NOT DETECTED   A.calcoaceticus-baumannii NOT DETECTED NOT DETECTED   Bacteroides fragilis NOT DETECTED NOT DETECTED   Enterobacterales NOT DETECTED NOT DETECTED   Enterobacter cloacae complex NOT DETECTED NOT DETECTED   Escherichia coli NOT DETECTED NOT DETECTED   Klebsiella aerogenes NOT DETECTED NOT DETECTED   Klebsiella oxytoca NOT DETECTED NOT DETECTED   Klebsiella pneumoniae NOT DETECTED NOT DETECTED   Proteus species NOT  DETECTED NOT DETECTED   Salmonella species NOT DETECTED NOT DETECTED   Serratia marcescens NOT DETECTED NOT DETECTED   Haemophilus influenzae NOT DETECTED NOT DETECTED   Neisseria meningitidis NOT DETECTED NOT DETECTED   Pseudomonas aeruginosa NOT DETECTED NOT DETECTED   Stenotrophomonas maltophilia NOT DETECTED NOT DETECTED   Candida albicans NOT DETECTED NOT DETECTED   Candida auris NOT DETECTED NOT DETECTED   Candida glabrata NOT DETECTED NOT DETECTED   Candida krusei NOT DETECTED NOT DETECTED   Candida parapsilosis NOT DETECTED NOT DETECTED   Candida tropicalis NOT DETECTED NOT DETECTED   Cryptococcus neoformans/gattii NOT DETECTED NOT DETECTED   Alfonse Spruce, PharmD PGY2 ID Pharmacy Resident [phone]  12/14/2020  2:47 PM

## 2020-12-14 NOTE — Progress Notes (Signed)
Family Medicine Teaching Service Daily Progress Note Intern Pager: [phone]  Patient name: Lauren Barber Medical record number: [id number] Date of birth: 10-22-1956 Age: 65 y.o. Gender: female  Primary Care Provider: Salome Arnt, [place] Consultants: Nephrology Code Status: Full  Pt Overview and Major Events to Date:  1/23: Admitted  Assessment and Plan:  Lauren Barber is a 65 y.o. female presenting from [place] with altered mental status. PMH is significant for CKD, T2DM, depression, GERD, hyperlipidemia, hypothyroidism, history of CVA, sacral ulcers.  Sepsis  Urinary Tract Infection Patient currently meeting sepsis criteria as she is tachycardic and tachypneic.  Urine culture grew >100,000 pansensitive pseudomonas and 50,000 vancomycin resistant enterococcus.  Blood cultures no growth x2 days -Continue Zosyn (plan for 7-10 day total abx course) -Continue IV fluids  Altered mental status- improving MRI brain with no acute findings. Etiology thought to be sepsis secondary to UTI. Also likely component of uremia 2/2 acute renal failure. At baseline patient is alert and oriented x4, feeds herself, has no difficulties with speech. -SLP swallow eval -PT/OT -Nephro following, appreciate recommendations -Treatment of UTI as above  Acute renal failure on CKD Creatinine 3.11 this morning (2.85 yesterday, 5.54 on admission). BUN significantly elevated to 133. -Nephrology following, appreciate recommendations -D5 1/2 NS @ 180mL/hr -Daily RFP -sodium bicarb 1300mg  PO BID  Hypernatremia Sodium 150 this morning (from 145 previously) -IV fluids switched to D5 1/2 NS -Daily RFP  Sacral Skin Changes Patient was evaluated by wound care. Her sacral area has skin changes consistent with scar tissue. No active wounds at this time. -Desitin prn -Keep area clean and dry  T2DM Very well controlled- A1c 6.2%. Glucose 105 this morning. Patient has remained NPO since admission due to altered  mental status. Home meds: Januvia and Trulicity -Holding home meds -CBG monitoring -SLP eval  History of CVA  Hyperlipidemia Patient bedbound at baseline 2/2 deficits from prior stroke. On Atorvastatin 20mg  daily and ASA 81 mg daily at home. Also on tizanidine 2mg  QID for spasticity -Holding home meds pending SLP eval  Hypertension BP has been normotensive over past 24h (on soft side this morning). Takes torsemide 10mg  daily and spironolactone 25mg  daily at home. -Holding home meds in the setting of low BP and AKI  Depression Home meds include fluoxetine 50mg  daily and desvenlafaxine 100mg  daily -Holding home meds, will plan to restart after SLP eval  Gout Chronic, stable. Home medications include colchicine 0.6 mg BID and allopurinol 200 mg/day. -Holding home medications for now  GERD Chronic, stable. Home medications include Pepcid 20 mg/day -Holding home medications  Hypothyroidism Home medications include Synthroid 50 mcg daily. TSH wnl on admission (2.832). -Plan to restart home medications pending SLP eval   FEN/GI: NPO pending SLP eval PPx: Heparin   Status is: Inpatient Remains inpatient appropriate because: IV treatments appropriate due to intensity of illness or inability to take PO  Dispo: The patient is from: SNF              Anticipated d/c is to: SNF              Anticipated d/c date is: 2 days              Patient currently is not medically stable to d/c.   Difficult to place patient No   Subjective:  No acute events overnight. Patient alert this morning but still not at baseline. She is very slow to respond, only says 1-2 words with difficult to understand speech.  Objective: Temp:  [  97.4 F (36.3 C)-99.3 F (37.4 C)] 99.3 F (37.4 C) (01/26 0350) Pulse Rate:  [95-117] 115 (01/26 0350) Resp:  [16-25] 25 (01/26 0500) BP: (91-113)/(56-79) 94/56 (01/26 0350) SpO2:  [100 %] 100 % (01/26 0350) Weight:  [114.2 kg] 114.2 kg (01/26 0350) Physical  Exam: General: Alert, obese female resting comfortably in bed Eyes: PERRL Cardiovascular: Tachycardic, normal S1/S2 Respiratory: Tachypneic to the 20s, lungs CTAB although limited by body habitus Abdomen: obese, soft, nondistended, nontender Neuro: Alert, slow to respond but follows simple commands  Laboratory: Recent Labs  Lab 12/07/20 1108 12/11/20 1313 12/12/20 0528  WBC 7.8 7.7 6.6  HGB 12.1 12.1 10.6*  HCT 38.4 39.2 35.6*  PLT 255 233 175   Recent Labs  Lab 12/12/20 1547 12/13/20 0427 12/14/20 0340  NA 145 145 150*  K 4.7 3.8 4.4  CL 116* 107 119*  CO2 16* 24 16*  BUN 138* 119* 133*  CREATININE 3.69* 2.85* 3.11*  CALCIUM 9.6 8.1* 9.6  PROT 7.8 6.1* 7.3  BILITOT 0.7 0.7 1.1  ALKPHOS 146* 116 151*  ALT 216* 153* 145*  AST 127* 82* 70*  GLUCOSE 97 84 101*    Imaging/Diagnostic Tests: MR BRAIN W WO CONTRAST Result Date: 12/13/2020 IMPRESSION: Marked motion degradation. No acute or reversible finding. Chronic small-vessel ischemic changes throughout the brain as outlined above. Electronically Signed   By: Nelson Chimes M.D.   On: 12/13/2020 13:21     Alcus Dad, MD 12/14/2020, 6:20 AM PGY-1, [place] Intern pager: [phone], text pages welcome

## 2020-12-14 NOTE — Progress Notes (Signed)
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

## 2020-12-14 NOTE — Progress Notes (Signed)
Lab attempted unsucessfull

## 2020-12-14 NOTE — Evaluation (Signed)
Clinical/Bedside Swallow Evaluation Patient Details  Name: Lauren Barber MRN: [id number] Date of Birth: 1956/10/11  Today's Date: 12/14/2020 Time: SLP Start Time (ACUTE ONLY): 1660 SLP Stop Time (ACUTE ONLY): 0952 SLP Time Calculation (min) (ACUTE ONLY): 15 min  Past Medical History:  Past Medical History:  Diagnosis Date  . Anemia   . Anxiety   . Atopic conjunctivitis   . Cardiomegaly   . Cellulitis    legs  . Cellulitis of right thigh 08/13/2016  . Chronic kidney disease    stage 3 per nursing home chart  . CVA (cerebral vascular accident) ([place])   . Degenerative, intervertebral disc    back, legs  . Depressive disorder    depressive disorder  . Diabetes mellitus without complication ([place])   . Endometriosis   . Essential hypertension 04/12/2007   Qualifier: Diagnosis of  By: Jobe Igo MD, Shanon Brow    . Gout    feet  . Headache(784.0)    otc meds prn  . History of blood transfusion [place] - unsure number of units transfused  . Hypertension   . Hypothyroidism   . Insomnia   . Neuromuscular disorder ([place])    diabetic neuropathy - feet  . Obesities, morbid ([place])   . Stroke [facility]) 2013  . Vaginal bleeding    Past Surgical History:  Past Surgical History:  Procedure Laterality Date  . BIOPSY ENDOMETRIAL N/A 10 03 2013  . [place]   x 2  . COLONOSCOPY WITH PROPOFOL N/A 11/14/2017   Procedure: COLONOSCOPY WITH PROPOFOL;  Surgeon: Milus Banister, MD;  Location: [facility];  Service: Endoscopy;  Laterality: N/A;  . DILATION AND CURETTAGE OF UTERUS  2013  . HYSTEROSCOPY WITH D & C N/A 06/28/2014   Procedure: DILATATION AND CURETTAGE /HYSTEROSCOPY;  Surgeon: Lavonia Drafts, MD;  Location: [facility];  Service: Gynecology;  Laterality: N/A;  . INCISIONAL HERNIA REPAIR N/A 09/26/2017   Procedure: DIAGNOSTIC LAPAROSCOPY;  Surgeon: Michael Boston, MD;  Location: [facility];  Service: General;  Laterality: N/A;  . TONSILLECTOMY    . WISDOM TOOTH  EXTRACTION     HPI:  Pt is a 65 y/o female admitted from SNF secondary to ARF, AMS, and UTI. MRI without acute findings. CXR without evidence of PNA. PMH includes DM, CVA, HTN, gout, and sacral wounds.   Assessment / Plan / Recommendation Clinical Impression  Pt has evidence of an oral and suspected pharyngeal dysphagia with cognition and level of alertness likely largely contributing to current presentation. She is drowsy with reduced awareness, not consistently able to suck on a straw. She also exhibits frequent clenches her teeth to block a straw or orally holds boluses. Anterior spillage without pt awareness is noted on her L side. She initiates a swallow response to palpation most consistently while drinking thin liquids via straw, but then there is coughing that follows. Recommend that she remain NPO given current mentation, but with good prognosis for advancement as her cognition improves. SLP Visit Diagnosis: Dysphagia, unspecified (R13.10)    Aspiration Risk  Moderate aspiration risk    Diet Recommendation NPO   Medication Administration: Via alternative means    Other  Recommendations Oral Care Recommendations: Oral care QID Other Recommendations: Have oral suction available   Follow up Recommendations Skilled Nursing facility      Frequency and Duration min 2x/week  2 weeks       Prognosis Prognosis for Safe Diet Advancement: Good Barriers to Reach Goals: Cognitive deficits  Swallow Study   General HPI: Pt is a 65 y/o female admitted from SNF secondary to ARF, AMS, and UTI. MRI without acute findings. CXR without evidence of PNA. PMH includes DM, CVA, HTN, gout, and sacral wounds. Type of Study: Bedside Swallow Evaluation Previous Swallow Assessment: none in chart Diet Prior to this Study: NPO Temperature Spikes Noted: No Respiratory Status: Room air History of Recent Intubation: No Behavior/Cognition: Cooperative;Requires cueing;Lethargic/Drowsy Oral Cavity  Assessment: Other (comment) (limited visibility, does not open mouth fully) Oral Care Completed by SLP: Recent completion by staff Oral Cavity - Dentition: Adequate natural dentition Vision: Functional for self-feeding Self-Feeding Abilities: Total assist Patient Positioning: Upright in bed Baseline Vocal Quality: Low vocal intensity (speech also slurred) Volitional Cough: Cognitively unable to elicit Volitional Swallow: Unable to elicit    Oral/Motor/Sensory Function Overall Oral Motor/Sensory Function: Other (comment) (not following commands well to assess)   Ice Chips Ice chips: Impaired Presentation: Spoon Oral Phase Impairments: Reduced labial seal;Poor awareness of bolus Oral Phase Functional Implications: Oral holding;Left anterior spillage Pharyngeal Phase Impairments: Unable to trigger swallow   Thin Liquid Thin Liquid: Impaired Presentation: Spoon;Straw Oral Phase Impairments: Reduced labial seal;Poor awareness of bolus Oral Phase Functional Implications: Oral holding;Left anterior spillage Pharyngeal  Phase Impairments: Cough - Immediate    Nectar Thick Nectar Thick Liquid: Not tested   Honey Thick Honey Thick Liquid: Not tested   Puree Puree: Impaired Presentation: Spoon Oral Phase Impairments: Poor awareness of bolus Oral Phase Functional Implications: Oral holding   Solid     Solid: Not tested      Osie Bond., M.A. [place] Pager [phone] Office [phone]  12/14/2020,11:25 AM

## 2020-12-14 NOTE — Progress Notes (Addendum)
[facility] Progress Note   Subjective:   Awake but less alert today.   Ins yesterday 1.4L/UOP 461mL; afebrile, BPs 90s.  BUN down from 161 to 144 to 119 but now to 133, Cr from 5.5 to 4.16 t0 2.85 to 3.11.  Serum bicarb 16.    I was able to speak to mom on phone at length  Objective Vitals:   12/13/20 2350 12/14/20 0350 12/14/20 0400 12/14/20 0500  BP: 91/67 (!) 94/56    Pulse: (!) 116 (!) 115    Resp: 16 (!) 24 (!) 24 (!) 25  Temp: 98.7 F (37.1 C) 99.3 F (37.4 C)    TempSrc: Oral Oral    SpO2: 100% 100%    Weight:  114.2 kg    Height:       Physical Exam General: sitting up in bed drowsy Heart: tachy to low 100s on exam, no rub Lungs: normal wob on [place], clear ant  Abdomen: soft, obese Extremities: no edema Neuro:  Drowsy, says name but nothing else  Additional Objective Labs: Basic Metabolic Panel: Recent Labs  Lab 12/11/20 2304 12/12/20 0632 12/12/20 1547 12/13/20 0427 12/14/20 0340  NA 141   < > 145 145 150*  K 5.5*   < > 4.7 3.8 4.4  CL 113*   < > 116* 107 119*  CO2 13*   < > 16* 24 16*  GLUCOSE 96   < > 97 84 101*  BUN 155*   < > 138* 119* 133*  CREATININE 4.58*   < > 3.69* 2.85* 3.11*  CALCIUM 9.2   < > 9.6 8.1* 9.6  PHOS 6.5*  --  5.3*  --   --    < > = values in this interval not displayed.   Liver Function Tests: Recent Labs  Lab 12/12/20 1547 12/13/20 0427 12/14/20 0340  AST 127* 82* 70*  ALT 216* 153* 145*  ALKPHOS 146* 116 151*  BILITOT 0.7 0.7 1.1  PROT 7.8 6.1* 7.3  ALBUMIN 2.8* 2.2* 2.4*   No results for input(s): LIPASE, AMYLASE in the last 168 hours. CBC: Recent Labs  Lab 12/07/20 1108 12/11/20 1313 12/12/20 0528  WBC 7.8 7.7 6.6  NEUTROABS  --  4.9  --   HGB 12.1 12.1 10.6*  HCT 38.4 39.2 35.6*  MCV 97.7 96.8 99.4  PLT 255 233 175   Blood Culture    Component Value Date/Time   SDES IN/OUT CATH URINE 12/11/2020 1219   SPECREQUEST  12/11/2020 1219    NONE Performed at [facility], [place] [address], [place], [place] [address]    CULT (A) 12/11/2020 1219    >=100,000 COLONIES/mL PSEUDOMONAS AERUGINOSA 50,000 COLONIES/mL VANCOMYCIN RESISTANT ENTEROCOCCUS ISOLATED    REPTSTATUS 12/14/2020 FINAL 12/11/2020 1219    Cardiac Enzymes: Recent Labs  Lab 12/11/20 1313 12/12/20 0632  CKTOTAL 2,474* 1,997*   CBG: Recent Labs  Lab 12/13/20 1318 12/13/20 1537 12/13/20 2116 12/14/20 0345 12/14/20 0636  GLUCAP 100* 99 90 86 105*   Iron Studies: No results for input(s): IRON, TIBC, TRANSFERRIN, FERRITIN in the last 72 hours. @lablastinr3 @ Studies/Results: MR BRAIN W WO CONTRAST  Result Date: 12/13/2020 CLINICAL DATA:  Worsening mental status. EXAM: MRI HEAD WITHOUT AND WITH CONTRAST TECHNIQUE: Multiplanar, multiecho pulse sequences of the brain and surrounding structures were obtained without and with intravenous contrast. CONTRAST:  68mL GADAVIST GADOBUTROL 1 MMOL/ML IV SOLN COMPARISON:  Head CT 12/11/2020 FINDINGS: Brain: The study suffers from motion degradation. Diffusion  imaging does not show any acute or subacute infarction. There are chronic small-vessel changes of the pons. There are a few old small vessel cerebellar infarctions. Cerebral hemispheres show old infarction in the right caudate and chronic small-vessel ischemic changes affecting the hemispheric deep white matter. No large vessel territory infarction. No mass lesion, hemorrhage, hydrocephalus or extra-axial collection. After contrast administration, no abnormal enhancement is seen. Vascular: Major vessels at the base of the brain show flow. Skull and upper cervical spine: Negative Sinuses/Orbits: Clear except for some fluid in the posterior ethmoid region on the left. Orbits appear unremarkable as seen. Other: None IMPRESSION: Marked motion degradation. No acute or reversible finding. Chronic small-vessel ischemic changes throughout the brain as outlined above. Electronically Signed   By: Nelson Chimes M.D.   On: 12/13/2020  13:21   Medications: . sodium chloride 100 mL/hr at 12/14/20 0527  . sodium chloride Stopped (12/13/20 2240)  . piperacillin-tazobactam (ZOSYN)  IV 12.5 mL/hr at 12/14/20 0527   . heparin  5,000 Units Subcutaneous Q8H  . insulin aspart  0-6 Units Subcutaneous TID WC  . liver oil-zinc oxide   Topical BID    Assessment/Plan: **AKI on CKD 4:  Severe AKI in setting of UTI and AMS x 1 week prior ? Prerenal with poor po intake, ATN with sepsis from UTI.  CK modestly elevated as well.  Renal US without obstruction; kidneys small.  Last Cr for comparison 09/2017 2.1.  Cr 5.5 on presentation and has improved some with supportive care but a bit worse today.  Will repeat bladder scan.  She's being hydrated since she's not taking much po  - continue but switch to D5 1/2NS given hypernatremia.   She seems to be a poor candidate for long term dialysis with SNF bound and numerous comorbids. For now would continue supportive care and hopefully BUN/Cr will continue to improve.  Certainly part of her mental status could be uremia but I'd like to watch for now given improving.  I had a lengthy discussion with her mother today via phone - she's familiar with dialysis because patient's son (Alahia's son) does HD locally now x 2 months.  She is in agreement that we will pursue conservative care should need for dialysis arise.    **AMS: Per above, I suspect at least in part uremia but given improving parameters and poor dialysis candidate I will follow for now.  Primary holding neuroactive meds currently too and treating underlying infection.  Generally improved compared to admission but more lethargic compared to yesterday.    **UTI: abx per primary.   **Metabolic acidosis:  Stopped bicarb gtt yesterday, start oral bicarb now.  **Hypernatremia:  Serum sodium 150 today.  Switch to D5 1/2NS.   **Anemia: mild in 10s, no need for ESA.   Will follow.  Jannifer Hick MD 12/14/2020, 9:40 AM  [facility] Pager: [phone]

## 2020-12-14 NOTE — Progress Notes (Signed)
MD made ware of BP and HR No new orders at this time. Pt in no distress at this time.

## 2020-12-14 NOTE — Progress Notes (Addendum)
Interim progress note  Noted patient's vitals temp 99.5 F, heart rate 108-118, respiratory rate 24, BP 85/60, patient satting 95% on room air.  Patient is asymptomatic and at her baseline function.  Currently has on 125 cc/hour D5-half normal saline, also currently receiving Zosyn for recent urine culture growing Pseudomonas, urine culture also grew 50,000 colonies of vancomycin resistant Enterococcus which is sensitive to Ampicillin.  Update: patient developed fever of 100.6*F, Tylenol given. Patient noted to have Anion Gap of 16.  Placed orders for repeat blood culture, INR, Lactic Acid, 1 amp NaBicarb (patient did not receive PO Bicarb d/t being NPO), and ABG. If labs are acutely worse will consider broadening coverage with Linezolid.   Will continue to follow.    Milus Banister, [place], PGY-3 12/14/2020 11:27 PM

## 2020-12-15 ENCOUNTER — Inpatient Hospital Stay (HOSPITAL_COMMUNITY): Payer: Medicare HMO

## 2020-12-15 DIAGNOSIS — R509 Fever, unspecified: Secondary | ICD-10-CM

## 2020-12-15 DIAGNOSIS — I959 Hypotension, unspecified: Secondary | ICD-10-CM

## 2020-12-15 DIAGNOSIS — K7200 Acute and subacute hepatic failure without coma: Secondary | ICD-10-CM

## 2020-12-15 DIAGNOSIS — K567 Ileus, unspecified: Secondary | ICD-10-CM | POA: Diagnosis not present

## 2020-12-15 DIAGNOSIS — I361 Nonrheumatic tricuspid (valve) insufficiency: Secondary | ICD-10-CM

## 2020-12-15 DIAGNOSIS — R6520 Severe sepsis without septic shock: Secondary | ICD-10-CM

## 2020-12-15 DIAGNOSIS — A419 Sepsis, unspecified organism: Principal | ICD-10-CM

## 2020-12-15 DIAGNOSIS — N3001 Acute cystitis with hematuria: Secondary | ICD-10-CM | POA: Diagnosis not present

## 2020-12-15 DIAGNOSIS — N171 Acute kidney failure with acute cortical necrosis: Secondary | ICD-10-CM | POA: Diagnosis not present

## 2020-12-15 DIAGNOSIS — L89153 Pressure ulcer of sacral region, stage 3: Secondary | ICD-10-CM

## 2020-12-15 DIAGNOSIS — Z7401 Bed confinement status: Secondary | ICD-10-CM

## 2020-12-15 DIAGNOSIS — R4182 Altered mental status, unspecified: Secondary | ICD-10-CM | POA: Diagnosis not present

## 2020-12-15 LAB — ECHOCARDIOGRAM COMPLETE
AR max vel: 2.17 cm2
AV Area VTI: 1.95 cm2
AV Area mean vel: 2.12 cm2
AV Mean grad: 6 mmHg
AV Peak grad: 11.8 mmHg
Ao pk vel: 1.72 m/s
Height: 69 in
S' Lateral: 3.3 cm
Weight: 4130.54 oz

## 2020-12-15 LAB — RENAL FUNCTION PANEL
Albumin: 2 g/dL — ABNORMAL LOW (ref 3.5–5.0)
Albumin: 2 g/dL — ABNORMAL LOW (ref 3.5–5.0)
Anion gap: 16 — ABNORMAL HIGH (ref 5–15)
Anion gap: 15 (ref 5–15)
BUN: 134 mg/dL — ABNORMAL HIGH (ref 8–23)
BUN: 131 mg/dL — ABNORMAL HIGH (ref 8–23)
CO2: 11 mmol/L — ABNORMAL LOW (ref 22–32)
CO2: 14 mmol/L — ABNORMAL LOW (ref 22–32)
Calcium: 8.8 mg/dL — ABNORMAL LOW (ref 8.9–10.3)
Calcium: 8.7 mg/dL — ABNORMAL LOW (ref 8.9–10.3)
Chloride: 123 mmol/L — ABNORMAL HIGH (ref 98–111)
Chloride: 121 mmol/L — ABNORMAL HIGH (ref 98–111)
Creatinine, Ser: 3.16 mg/dL — ABNORMAL HIGH (ref 0.44–1.00)
Creatinine, Ser: 3.24 mg/dL — ABNORMAL HIGH (ref 0.44–1.00)
GFR, Estimated: 16 mL/min — ABNORMAL LOW (ref >60)
GFR, Estimated: 16 mL/min — ABNORMAL LOW (ref >60)
Glucose, Bld: 126 mg/dL — ABNORMAL HIGH (ref 70–99)
Glucose, Bld: 135 mg/dL — ABNORMAL HIGH (ref 70–99)
Phosphorus: 3.7 mg/dL (ref 2.5–4.6)
Phosphorus: 3.7 mg/dL (ref 2.5–4.6)
Potassium: 5.3 mmol/L — ABNORMAL HIGH (ref 3.5–5.1)
Potassium: 4.3 mmol/L (ref 3.5–5.1)
Sodium: 150 mmol/L — ABNORMAL HIGH (ref 135–145)
Sodium: 150 mmol/L — ABNORMAL HIGH (ref 135–145)

## 2020-12-15 LAB — BLOOD GAS, VENOUS
Acid-base deficit: 9 mmol/L — ABNORMAL HIGH (ref 0.0–2.0)
Bicarbonate: 16.4 mmol/L — ABNORMAL LOW (ref 20.0–28.0)
Drawn by: 5680
FIO2: 21
O2 Saturation: 97.9 %
Patient temperature: 37
pCO2, Ven: 36 mmHg — ABNORMAL LOW (ref 44.0–60.0)
pH, Ven: 7.281 (ref 7.250–7.430)
pO2, Ven: 133 mmHg — ABNORMAL HIGH (ref 32.0–45.0)

## 2020-12-15 LAB — PROCALCITONIN: Procalcitonin: 8.25 ng/mL

## 2020-12-15 LAB — SEDIMENTATION RATE: Sed Rate: 90 mm/hr — ABNORMAL HIGH (ref 0–22)

## 2020-12-15 LAB — HEPATIC FUNCTION PANEL
ALT: 147 U/L — ABNORMAL HIGH (ref 0–44)
AST: 132 U/L — ABNORMAL HIGH (ref 15–41)
Albumin: 1.8 g/dL — ABNORMAL LOW (ref 3.5–5.0)
Alkaline Phosphatase: 182 U/L — ABNORMAL HIGH (ref 38–126)
Bilirubin, Direct: 0.3 mg/dL — ABNORMAL HIGH (ref 0.0–0.2)
Indirect Bilirubin: 0.4 mg/dL (ref 0.3–0.9)
Total Bilirubin: 0.7 mg/dL (ref 0.3–1.2)
Total Protein: 6 g/dL — ABNORMAL LOW (ref 6.5–8.1)

## 2020-12-15 LAB — GLUCOSE, CAPILLARY
Glucose-Capillary: 105 mg/dL — ABNORMAL HIGH (ref 70–99)
Glucose-Capillary: 111 mg/dL — ABNORMAL HIGH (ref 70–99)
Glucose-Capillary: 104 mg/dL — ABNORMAL HIGH (ref 70–99)

## 2020-12-15 LAB — CBC
HCT: 28.2 % — ABNORMAL LOW (ref 36.0–46.0)
Hemoglobin: 8.8 g/dL — ABNORMAL LOW (ref 12.0–15.0)
MCH: 30.8 pg (ref 26.0–34.0)
MCHC: 31.2 g/dL (ref 30.0–36.0)
MCV: 98.6 fL (ref 80.0–100.0)
Platelets: 99 10*3/uL — ABNORMAL LOW (ref 150–400)
RBC: 2.86 MIL/uL — ABNORMAL LOW (ref 3.87–5.11)
RDW: 17.4 % — ABNORMAL HIGH (ref 11.5–15.5)
WBC: 6.3 10*3/uL (ref 4.0–10.5)
nRBC: 0.6 % — ABNORMAL HIGH (ref 0.0–0.2)

## 2020-12-15 LAB — BLOOD GAS, ARTERIAL
Acid-base deficit: 9.7 mmol/L — ABNORMAL HIGH (ref 0.0–2.0)
Bicarbonate: 15.2 mmol/L — ABNORMAL LOW (ref 20.0–28.0)
FIO2: 21
O2 Saturation: 95.3 %
Patient temperature: 37.7
pCO2 arterial: 31.6 mmHg — ABNORMAL LOW (ref 32.0–48.0)
pH, Arterial: 7.307 — ABNORMAL LOW (ref 7.350–7.450)
pO2, Arterial: 88.8 mmHg (ref 83.0–108.0)

## 2020-12-15 LAB — LACTIC ACID, PLASMA
Lactic Acid, Venous: 3.8 mmol/L (ref 0.5–1.9)
Lactic Acid, Venous: 3.9 mmol/L (ref 0.5–1.9)

## 2020-12-15 LAB — CULTURE, BLOOD (ROUTINE X 2)

## 2020-12-15 LAB — PROTIME-INR
INR: 1.7 — ABNORMAL HIGH (ref 0.8–1.2)
Prothrombin Time: 19 seconds — ABNORMAL HIGH (ref 11.4–15.2)

## 2020-12-15 LAB — C-REACTIVE PROTEIN: CRP: 18.1 mg/dL — ABNORMAL HIGH (ref <1.0)

## 2020-12-15 LAB — AMMONIA: Ammonia: 32 umol/L (ref 9–35)

## 2020-12-15 LAB — LACTATE DEHYDROGENASE: LDH: 266 U/L — ABNORMAL HIGH (ref 98–192)

## 2020-12-15 LAB — SARS CORONAVIRUS 2 (TAT 6-24 HRS): SARS Coronavirus 2: NEGATIVE

## 2020-12-15 MED ORDER — DEXTROSE-NACL 5-0.45 % IV SOLN: INTRAVENOUS | Status: DC

## 2020-12-15 MED ORDER — LEVOTHYROXINE SODIUM 100 MCG/5ML IV SOLN
25.0000 ug | Freq: Every day | INTRAVENOUS | Status: DC
  Administered 2020-12-15 – 2020-12-18 (×4): 25 ug via INTRAVENOUS
  Filled 2020-12-15 (×5): qty 5

## 2020-12-15 MED ORDER — SODIUM BICARBONATE 8.4 % IV SOLN
50.0000 meq | Freq: Once | INTRAVENOUS | Status: AC
  Administered 2020-12-15: 50 meq via INTRAVENOUS
  Filled 2020-12-15: qty 50

## 2020-12-15 MED ORDER — SODIUM BICARBONATE 8.4 % IV SOLN
INTRAVENOUS | Status: DC
  Filled 2020-12-15 (×4): qty 100

## 2020-12-15 MED ORDER — PERFLUTREN LIPID MICROSPHERE
1.0000 mL | INTRAVENOUS | Status: AC | PRN
  Administered 2020-12-15: 3 mL via INTRAVENOUS
  Filled 2020-12-15: qty 10

## 2020-12-15 MED ORDER — ACETAMINOPHEN 650 MG RE SUPP
650.0000 mg | RECTAL | Status: DC | PRN
  Administered 2020-12-15 – 2020-12-17 (×2): 650 mg via RECTAL
  Filled 2020-12-15 (×2): qty 1

## 2020-12-15 MED ORDER — SODIUM CHLORIDE 0.9 % IV BOLUS
1000.0000 mL | Freq: Once | INTRAVENOUS | Status: AC
  Administered 2020-12-15: 1000 mL via INTRAVENOUS

## 2020-12-15 MED ORDER — LINEZOLID 600 MG/300ML IV SOLN
600.0000 mg | Freq: Once | INTRAVENOUS | Status: AC
  Administered 2020-12-15: 600 mg via INTRAVENOUS
  Filled 2020-12-15: qty 300

## 2020-12-15 NOTE — Consult Note (Signed)
Date of Admission:  12/11/2020          Reason for Consult: Sepsis   Referring Provider: Madison Hickman, MD   Assessment:  1. Sepsis: Presented with AMS and found to be septic presumably due to UTI. Blood cultures with no growth in 4 days and urine culture showing Pseudomonas and VRE. Patient with intermittent fevers, but no leukocytosis. Currently on  zosyn and Linezolid today. Pending repeat bcx. Pending CT chest and CT abdomen.  Patient's encephalopathy likely due to combination of infection and uremia. In the setting of patient being bedbound, tachycardic and hypotensive, plan to evaluate for possible PE with imaging.   Plan: 1. Continue Zosyn and Linezolid for now until repeat blood culture results. 2. Getting an echocardiogram 3. Will get CT chest to rule out pulmonary pathology  4. Will get CT abd/pel to rule out ileus 5. Will follow-up FUO labs  Active Problems:   AKI (acute kidney injury) ([place])   Acute renal failure (HCC)   Acute renal failure (ARF) (HCC)   Urinary tract infection with hematuria   Scheduled Meds: . heparin  5,000 Units Subcutaneous Q8H  . insulin aspart  0-6 Units Subcutaneous TID WC  . levothyroxine  25 mcg Intravenous Daily  . liver oil-zinc oxide   Topical BID  . sodium bicarbonate  1,300 mg Oral BID   Continuous Infusions: . sodium chloride Stopped (12/15/20 0450)  . dextrose 5 % and 0.45% NaCl    . piperacillin-tazobactam (ZOSYN)  IV 12.5 mL/hr at 12/15/20 0848   PRN Meds:.sodium chloride, acetaminophen  HPI: Lauren Barber is a 65 y.o. female w/ PMH of T2DM, CKD IIIb, CVA, hypothyroidism, HTN, bedbound s/p CVA, HLD, depression, and obesity presented from [facility] with altered mental states and found to be septic 2/2 to UTI and possible uremic encephalopathy.   At baseline, patient is oriented x4.  Patient initially presented to the ED on 1/19 normal mental status, dehydration and foul-smelling urine.  CT abdomen pelvis showed possible  ileus.  Patient reported loss of taste/smell so she was tested for Covid which came back negative.  Urinalysis showed pyuria with urine culture culture that grew multiple species. Patient was discharged to her facility with Keflex after receiving IV fluids.   Patient's condition deteriorated while at the facility so he returned to the ED on 1/23.  During this admission he was found to have AKI on CKD with mild rhabdomyolysis, hypokalemia as well as hypotension requiring IV fluids.  Blood cultures were obtained the patient was started on empiric for vancomycin, cefepime and metronidazole.  Blood culture showed no growth so antibiotics regimen was de-escalated to cefepime and then to Zosyn.  Urine culture grew Pseudomonas and vancomycin resistant enterobacter.   Nephrology and PCCM were consulted for assistance in managing her encephalopathy.  Patient became febrile overnight.  Patient became somnolent and only responded to touch overnight.  ID was consulted for further evaluation and management sepsis secondary to UTI.  On exam, patient remains somnolent but follows a few commands to voice.  Fever curve trending down.  Encephalopathy likely multifactorial in nature.  Could be an element of uremic plus infectious etiology.  We will continue work-up with imaging and further infectious labs.  Pending repeat blood culture.   Review of Systems: Unable to obtain a comprehensive ROS due to patient's altered mental status. Patient able to mumble "no" when asked if she was in pain.   Past Medical History:  Diagnosis Date  . Anemia   .  Anxiety   . Atopic conjunctivitis   . Cardiomegaly   . Cellulitis    legs  . Cellulitis of right thigh 08/13/2016  . Chronic kidney disease    stage 3 per nursing home chart  . CVA (cerebral vascular accident) ([facility])   . Degenerative, intervertebral disc    back, legs  . Depressive disorder    depressive disorder  . Diabetes mellitus without complication ([facility])   .  Endometriosis   . Essential hypertension 04/12/2007   Qualifier: Diagnosis of  By: Jobe Igo MD, Shanon Brow    . Gout    feet  . Headache(784.0)    otc meds prn  . History of blood transfusion [place] - unsure number of units transfused  . Hypertension   . Hypothyroidism   . Insomnia   . Neuromuscular disorder ([facility])    diabetic neuropathy - feet  . Obesities, morbid ([place])   . Stroke [facility]) 2013  . Vaginal bleeding     Social History   Tobacco Use  . Smoking status: Former Smoker    Packs/day: 1.00    Years: 43.00    Pack years: 43.00    Types: Cigarettes    Quit date: 06/11/2012    Years since quitting: 8.5  . Smokeless tobacco: Never Used  Vaping Use  . Vaping Use: Never used  Substance Use Topics  . Alcohol use: No  . Drug use: No    Family History  Problem Relation Age of Onset  . Hypertension Mother   . Hypertension Father    Allergies  Allergen Reactions  . Nsaids Other (See Comments)    CKD Cr 2  . Shellfish Allergy Other (See Comments)    Per MAR    OBJECTIVE: Blood pressure (!) 91/59, pulse (!) 114, temperature 99.9 F (37.7 C), temperature source Rectal, resp. rate (!) 24, height 5\' 9"  (1.753 m), weight 117.1 kg, SpO2 98 %.  Physical Exam Constitutional:      Appearance: She is obese. She is ill-appearing.     Comments: Somnolent.   Eyes:     Extraocular Movements: Extraocular movements intact.  Cardiovascular:     Rate and Rhythm: Tachycardia present.     Heart sounds: Murmur heard.      Comments: II/VI heard best in the LUSB Pulmonary:     Effort: Pulmonary effort is normal. No respiratory distress.     Breath sounds: Rhonchi present.  Abdominal:     General: Bowel sounds are normal. There is distension.     Palpations: Abdomen is soft.     Tenderness: There is no abdominal tenderness.  Musculoskeletal:        General: Tenderness present. Normal range of motion.     Comments: Pain w/ movement of the arms and shoulders  Skin:     General: Skin is warm and dry.  Neurological:     Mental Status: She is disoriented.     Comments: Somnolent. Opens eyes to command. Able to follow some commands. Unable to assess strength and sensation.      Lab Results Lab Results  Component Value Date   WBC 9.0 12/14/2020   HGB 10.1 (L) 12/14/2020   HCT 32.0 (L) 12/14/2020   MCV 97.3 12/14/2020   PLT 139 (L) 12/14/2020    Lab Results  Component Value Date   CREATININE 3.24 (H) 12/15/2020   BUN 131 (H) 12/15/2020   NA 150 (H) 12/15/2020   K 4.3 12/15/2020   CL 121 (H)  12/15/2020   CO2 14 (L) 12/15/2020    Lab Results  Component Value Date   ALT 145 (H) 12/14/2020   AST 70 (H) 12/14/2020   ALKPHOS 151 (H) 12/14/2020   BILITOT 1.1 12/14/2020     Microbiology: Recent Results (from the past 240 hour(s))  Resp panel by RT-PCR (RSV, Flu A&B, Covid) Nasopharyngeal Swab     Status: None   Collection Time: 12/07/20 12:45 PM   Specimen: Nasopharyngeal Swab; Nasopharyngeal(NP) swabs in vial transport medium  Result Value Ref Range Status   SARS Coronavirus 2 by RT PCR NEGATIVE NEGATIVE Final    Comment: (NOTE) SARS-CoV-2 target nucleic acids are NOT DETECTED.  The SARS-CoV-2 RNA is generally detectable in upper respiratory specimens during the acute phase of infection. The lowest concentration of SARS-CoV-2 viral copies this assay can detect is 138 copies/mL. A negative result does not preclude SARS-Cov-2 infection and should not be used as the sole basis for treatment or other patient management decisions. A negative result may occur with  improper specimen collection/handling, submission of specimen other than nasopharyngeal swab, presence of viral mutation(s) within the areas targeted by this assay, and inadequate number of viral copies(<138 copies/mL). A negative result must be combined with clinical observations, patient history, and epidemiological information. The expected result is Negative.  Fact Sheet for  Patients:  EntrepreneurPulse.com.au  Fact Sheet for Healthcare Providers:  IncredibleEmployment.be  This test is no t yet approved or cleared by the [place] FDA and  has been authorized for detection and/or diagnosis of SARS-CoV-2 by FDA under an Emergency Use Authorization (EUA). This EUA will remain  in effect (meaning this test can be used) for the duration of the COVID-19 declaration under Section 564(b)(1) of the Act, 21 U.S.C.section 360bbb-3(b)(1), unless the authorization is terminated  or revoked sooner.       Influenza A by PCR NEGATIVE NEGATIVE Final   Influenza B by PCR NEGATIVE NEGATIVE Final    Comment: (NOTE) The Xpert Xpress SARS-CoV-2/FLU/RSV plus assay is intended as an aid in the diagnosis of influenza from Nasopharyngeal swab specimens and should not be used as a sole basis for treatment. Nasal washings and aspirates are unacceptable for Xpert Xpress SARS-CoV-2/FLU/RSV testing.  Fact Sheet for Patients: EntrepreneurPulse.com.au  Fact Sheet for Healthcare Providers: IncredibleEmployment.be  This test is not yet approved or cleared by the [place] FDA and has been authorized for detection and/or diagnosis of SARS-CoV-2 by FDA under an Emergency Use Authorization (EUA). This EUA will remain in effect (meaning this test can be used) for the duration of the COVID-19 declaration under Section 564(b)(1) of the Act, 21 U.S.C. section 360bbb-3(b)(1), unless the authorization is terminated or revoked.     Resp Syncytial Virus by PCR NEGATIVE NEGATIVE Final    Comment: (NOTE) Fact Sheet for Patients: EntrepreneurPulse.com.au  Fact Sheet for Healthcare Providers: IncredibleEmployment.be  This test is not yet approved or cleared by the [place] FDA and has been authorized for detection and/or diagnosis of SARS-CoV-2 by FDA under an Emergency  Use Authorization (EUA). This EUA will remain in effect (meaning this test can be used) for the duration of the COVID-19 declaration under Section 564(b)(1) of the Act, 21 U.S.C. section 360bbb-3(b)(1), unless the authorization is terminated or revoked.  Performed at [facility], [place] [address], [place], [place] [address]   Urine culture     Status: Abnormal   Collection Time: 12/07/20  6:47 PM   Specimen: Urine, Random  Result Value  Ref Range Status   Specimen Description URINE, RANDOM  Final   Special Requests   Final    NONE Performed at [facility], [address], [place], [place] [address]    Culture MULTIPLE SPECIES PRESENT, SUGGEST RECOLLECTION (A)  Final   Report Status 12/09/2020 FINAL  Final  Blood Culture (routine x 2)     Status: Abnormal   Collection Time: 12/11/20 11:48 AM   Specimen: BLOOD  Result Value Ref Range Status   Specimen Description BLOOD BLOOD RIGHT HAND  Final   Special Requests   Final    BOTTLES DRAWN AEROBIC AND ANAEROBIC Blood Culture results may not be optimal due to an inadequate volume of blood received in culture bottles   Culture  Setup Time   Final    GRAM POSITIVE COCCI IN CLUSTERS AEROBIC BOTTLE ONLY CRITICAL RESULT CALLED TO, READ BACK BY AND VERIFIED WITH: A. [facility] PharmD 14:35 12/14/20 ([email or web address])    Culture (A)  Final    STAPHYLOCOCCUS CAPITIS THE SIGNIFICANCE OF ISOLATING THIS ORGANISM FROM A SINGLE SET OF BLOOD CULTURES WHEN MULTIPLE SETS ARE DRAWN IS UNCERTAIN. PLEASE NOTIFY THE MICROBIOLOGY DEPARTMENT WITHIN ONE WEEK IF SPECIATION AND SENSITIVITIES ARE REQUIRED. Performed at [facility], [place] [address], [place], [place] [address]    Report Status 12/15/2020 FINAL  Final  Blood Culture ID Panel (Reflexed)     Status: Abnormal   Collection Time: 12/11/20 11:48 AM  Result Value Ref Range Status   Enterococcus faecalis NOT DETECTED NOT DETECTED Final   Enterococcus Faecium NOT DETECTED NOT DETECTED Final    Listeria monocytogenes NOT DETECTED NOT DETECTED Final   Staphylococcus species DETECTED (A) NOT DETECTED Final    Comment: CRITICAL RESULT CALLED TO, READ BACK BY AND VERIFIED WITH: A. [facility] PharmD 14:35 12/14/20 ([email or web address])    Staphylococcus aureus (BCID) NOT DETECTED NOT DETECTED Final   Staphylococcus epidermidis NOT DETECTED NOT DETECTED Final   Staphylococcus lugdunensis NOT DETECTED NOT DETECTED Final   Streptococcus species NOT DETECTED NOT DETECTED Final   Streptococcus agalactiae NOT DETECTED NOT DETECTED Final   Streptococcus pneumoniae NOT DETECTED NOT DETECTED Final   Streptococcus pyogenes NOT DETECTED NOT DETECTED Final   A.calcoaceticus-baumannii NOT DETECTED NOT DETECTED Final   Bacteroides fragilis NOT DETECTED NOT DETECTED Final   Enterobacterales NOT DETECTED NOT DETECTED Final   Enterobacter cloacae complex NOT DETECTED NOT DETECTED Final   Escherichia coli NOT DETECTED NOT DETECTED Final   Klebsiella aerogenes NOT DETECTED NOT DETECTED Final   Klebsiella oxytoca NOT DETECTED NOT DETECTED Final   Klebsiella pneumoniae NOT DETECTED NOT DETECTED Final   Proteus species NOT DETECTED NOT DETECTED Final   Salmonella species NOT DETECTED NOT DETECTED Final   Serratia marcescens NOT DETECTED NOT DETECTED Final   Haemophilus influenzae NOT DETECTED NOT DETECTED Final   Neisseria meningitidis NOT DETECTED NOT DETECTED Final   Pseudomonas aeruginosa NOT DETECTED NOT DETECTED Final   Stenotrophomonas maltophilia NOT DETECTED NOT DETECTED Final   Candida albicans NOT DETECTED NOT DETECTED Final   Candida auris NOT DETECTED NOT DETECTED Final   Candida glabrata NOT DETECTED NOT DETECTED Final   Candida krusei NOT DETECTED NOT DETECTED Final   Candida parapsilosis NOT DETECTED NOT DETECTED Final   Candida tropicalis NOT DETECTED NOT DETECTED Final   Cryptococcus neoformans/gattii NOT DETECTED NOT DETECTED Final    Comment: Performed at [facility], [address]  [place],  [address]  Blood Culture (routine x 2)  Status: None (Preliminary result)   Collection Time: 12/11/20 11:53 AM   Specimen: BLOOD  Result Value Ref Range Status   Specimen Description BLOOD RIGHT ANTECUBITAL  Final   Special Requests   Final    BOTTLES DRAWN AEROBIC AND ANAEROBIC Blood Culture results may not be optimal due to an inadequate volume of blood received in culture bottles   Culture   Final    NO GROWTH 3 DAYS Performed at [facility], [place] [address], [place], [place] [address]    Report Status PENDING  Incomplete  SARS Coronavirus 2 by RT PCR (hospital order, performed in [facility] hospital lab) Nasopharyngeal Nasopharyngeal Swab     Status: None   Collection Time: 12/11/20 11:56 AM   Specimen: Nasopharyngeal Swab  Result Value Ref Range Status   SARS Coronavirus 2 NEGATIVE NEGATIVE Final    Comment: (NOTE) SARS-CoV-2 target nucleic acids are NOT DETECTED.  The SARS-CoV-2 RNA is generally detectable in upper and lower respiratory specimens during the acute phase of infection. The lowest concentration of SARS-CoV-2 viral copies this assay can detect is 250 copies / mL. A negative result does not preclude SARS-CoV-2 infection and should not be used as the sole basis for treatment or other patient management decisions.  A negative result may occur with improper specimen collection / handling, submission of specimen other than nasopharyngeal swab, presence of viral mutation(s) within the areas targeted by this assay, and inadequate number of viral copies (<250 copies / mL). A negative result must be combined with clinical observations, patient history, and epidemiological information.  Fact Sheet for Patients:   StrictlyIdeas.no  Fact Sheet for Healthcare Providers: BankingDealers.co.za  This test is not yet approved or  cleared by the [place] FDA and has been authorized for detection and/or  diagnosis of SARS-CoV-2 by FDA under an Emergency Use Authorization (EUA).  This EUA will remain in effect (meaning this test can be used) for the duration of the COVID-19 declaration under Section 564(b)(1) of the Act, 21 U.S.C. section 360bbb-3(b)(1), unless the authorization is terminated or revoked sooner.  Performed at [facility], [place] [address], [place], [place] [address]   Urine culture     Status: Abnormal   Collection Time: 12/11/20 12:19 PM   Specimen: In/Out Cath Urine  Result Value Ref Range Status   Specimen Description IN/OUT CATH URINE  Final   Special Requests   Final    NONE Performed at [facility], [place] [address], [place], [place] [address]    Culture (A)  Final    >=100,000 COLONIES/mL PSEUDOMONAS AERUGINOSA 50,000 COLONIES/mL VANCOMYCIN RESISTANT ENTEROCOCCUS ISOLATED    Report Status 12/14/2020 FINAL  Final   Organism ID, Bacteria PSEUDOMONAS AERUGINOSA (A)  Final   Organism ID, Bacteria VANCOMYCIN RESISTANT ENTEROCOCCUS ISOLATED (A)  Final      Susceptibility   Pseudomonas aeruginosa - MIC*    CEFTAZIDIME 4 SENSITIVE Sensitive     CIPROFLOXACIN <=0.25 SENSITIVE Sensitive     GENTAMICIN <=1 SENSITIVE Sensitive     IMIPENEM 1 SENSITIVE Sensitive     PIP/TAZO 8 SENSITIVE Sensitive     CEFEPIME 2 SENSITIVE Sensitive     * >=100,000 COLONIES/mL PSEUDOMONAS AERUGINOSA   Vancomycin resistant enterococcus isolated - MIC*    AMPICILLIN <=2 SENSITIVE Sensitive     NITROFURANTOIN <=16 SENSITIVE Sensitive     VANCOMYCIN >=32 RESISTANT Resistant     LINEZOLID 2 SENSITIVE Sensitive     * 50,000 COLONIES/mL  VANCOMYCIN RESISTANT ENTEROCOCCUS ISOLATED  SARS CORONAVIRUS 2 (TAT 6-24 HRS) Nasopharyngeal Nasopharyngeal Swab     Status: None   Collection Time: 12/15/20  3:25 AM   Specimen: Nasopharyngeal Swab  Result Value Ref Range Status   SARS Coronavirus 2 NEGATIVE NEGATIVE Final    Comment: (NOTE) SARS-CoV-2 target nucleic acids are NOT  DETECTED.  The SARS-CoV-2 RNA is generally detectable in upper and lower respiratory specimens during the acute phase of infection. Negative results do not preclude SARS-CoV-2 infection, do not rule out co-infections with other pathogens, and should not be used as the sole basis for treatment or other patient management decisions. Negative results must be combined with clinical observations, patient history, and epidemiological information. The expected result is Negative.  Fact Sheet for Patients: SugarRoll.be  Fact Sheet for Healthcare Providers: https://www.woods-mathews.com/  This test is not yet approved or cleared by the [place] FDA and  has been authorized for detection and/or diagnosis of SARS-CoV-2 by FDA under an Emergency Use Authorization (EUA). This EUA will remain  in effect (meaning this test can be used) for the duration of the COVID-19 declaration under Se ction 564(b)(1) of the Act, 21 U.S.C. section 360bbb-3(b)(1), unless the authorization is terminated or revoked sooner.  Performed at [facility], [place] [address] [place], [place] [address]     Shalaina Guardiola M Giovannina Mun, [facility] [place] [phone] pager  12/15/2020, 12:04 PM

## 2020-12-15 NOTE — Progress Notes (Signed)
Radiology dept reported that a 2-view CXR cannot be obtained while pt on Airborne and Contact precautions. CXR would have to be portable 1-view. Will continue to monitor.

## 2020-12-15 NOTE — Progress Notes (Signed)
Interim progress note  Called number listed in the chart [phone].  Was unable to reach Ms. [facility], however was able to speak to the patient's brother.  I disclosed to the brother that while the patient is stable at this time, she has not had much clinical improvement since arrival to the hospital.  I discussed the idea of having palliative reach out to his mom versus meeting here in the hospital in person.  He believes she would be open to this idea, he recommended calling back tomorrow.  In the meantime patient will remain full code.  Milus Banister, [place], PGY-3 12/15/2020 9:47 PM

## 2020-12-15 NOTE — Progress Notes (Addendum)
Initial Nutrition Assessment  DOCUMENTATION CODES:   Obesity unspecified  INTERVENTION:   -RD will follow for diet advancement and adjust supplement regimen as appropriate -Once cortrak is placed:  Initiate Osmolite 1.5 @ 25 ml/hr and increase by 10 ml every 8 hours to goal rate of 55 ml/hr.   45 ml Prosource TF TID   Tube feeding regimen provides 2100 kcal (100% of needs), 116 grams of protein, and 1006 ml of H2O.   -If feedings are initiated, monitor Mg, K, and Phos daily and replete as necessary due to high refeeding risk   NUTRITION DIAGNOSIS:   Inadequate oral intake related to inability to eat as evidenced by NPO status.  GOAL:   Patient will meet greater than or equal to 90% of their needs  MONITOR:   Diet advancement,Labs,Weight trends,Skin,I & O's  REASON FOR ASSESSMENT:   Low [place]    ASSESSMENT:   Lauren Barber is a 65 y.o. female presenting from [place] with altered mental status. PMH is significant for CKD, T2DM, depression, GERD, hyperlipidemia, hypothyroidism, history of CVA, sacral ulcers.  Pt admitted with AMS from SNF.   1/26- s/p BSE- recommend NPO  Reviewed I/O's: -800 ml x 24 hours and +5.5 L since admission  UOP: 900 ml x 24 hours  1/26- s/p BSE- recommend NPO  Pt not available at time of visit.  Per nephrology notes, pt is a very poor HD candidate.  Per PCCM notes, pt currently able to protect airway, however, would not do well on life support. Attending awaiting call back from family to discuss goals of care.  If prolonged NPO status is anticipated and full scope of care is desired, pt would benefit from addition of alternative means of nutrition/ hydration. Noted order for cortrak placement.  Reviewed wt hx; pt has experienced a 19.3% wt loss over the past month, which is significant for time frame.  Lab Results  Component Value Date   HGBA1C 6.2 (H) 12/12/2020   PTA DM medications are 50 mg [place] daily and 1.5 mg  trulicity weekly.   Labs reviewed: CBGS: 111 (inpatient orders for glycemic control are 0-6 units insulin aspart TID with meals).   Diet Order:   Diet Order            Diet NPO time specified  Diet effective now                 EDUCATION NEEDS:   Not appropriate for education at this time  Skin:  Skin Assessment: Skin Integrity Issues: Skin Integrity Issues:: Other (Comment) Other: MASD to buttocks  Last BM:  Unknown  Height:   Ht Readings from Last 1 Encounters:  12/12/20 5\' 9"  (1.753 m)    Weight:   Wt Readings from Last 1 Encounters:  12/15/20 117.1 kg    Ideal Body Weight:  65.9 kg  BMI:  Body mass index is 38.12 kg/m.  Estimated Nutritional Needs:   Kcal:  2000-2200  Protein:  115-130 grams  Fluid:  > 2 L    Loistine Chance, RD, LDN, [place] Registered Dietitian II Certified Diabetes Care and Education Specialist Please refer to [facility] for RD and/or RD on-call/weekend/after hours pager

## 2020-12-15 NOTE — Progress Notes (Signed)
INFECTIOUS DISEASE ATTENDING ADDENDUM:   Date: 12/15/2020  Patient name: Lauren Barber  Medical record number: [id number]  Date of birth: May 15, 1956   This patient has been seen and discussed with the house staff. Please see the resident's note for complete details. I have reviewed the pertinent HPI, Past medical, surgical, family, social history, ROS, allergies, medications, pertinent laboratory and radiographic data and examined the patient independently.  I concur with their findings with the following additions/corrections:  See also attested note by Dr. Coy Saunas.  This is a 65 year old African-American woman with multiple medical problems including prior stroke, diabetes mellitus obesity hypothyroidism who has been bedbound for many years is my understanding.  She resides at [facility].  She had been seen in the emergency department on 19 January when she presented with confusion and there was thoughts that she had a urinary tract infection.  During that ER stay she had a CT of the abdomen and pelvis showed some gaseous distention of the colon but no obstruction concern for possible mild ileus and a simple adnexal cyst.  She had a urinalysis performed which did show pyuria.  Urine culture only yielded mixed organisms and she had been discharged on Keflex.  Unfortunately she deteriorated further at the skilled nursing facility was readmitted to the family medicine service again with confusion dehydration and foul-smelling urine.  On admission to the hospital she was in acute renal failure with worsening of chronic kidney disease and also evidence of rhabdomyolysis.  She had also significant hyperkalemia and was hypotensive.  She also had elevated transaminases as well as alkaline phosphatase.   Other been some concern for COVID-19 and the patient apparently had possible loss of sense of smell though she was tested negative for Covid on the 19th when she had such symptoms  apparently and is tested negative again now on admission to the hospital both times by PCR.  On admission she had blood cultures obtained as well as urine cultures and she was placed on cefepime metronidazole and vancomycin.  She was then de-escalate to cefepime and then changed to Zosyn.  Urine cultures have grown.  More than 100,000 colony-forming units of Pseudomonas aeruginosa and 50,000 colony-forming units of vancomycin resistant but ampicillin sensitive Enterococcus.  Blood cultures are growing a coagulase-negative species in 1 of 2 sites.   She again became febrile overnight despite being on appropriate antibiotics with a temperature reaching nearly 102 degrees.  She remains confused.  Has been seen by nephrology and also critical care medicine.  Renal failure appears improved by creatinine versus when she was admitted.  She continues to have elevated transaminases and alkaline phosphatase.  Her hepatitis panel was negative.  She had Zyvox added to her Zosyn.  We will obtain a CT of the chest but also of the abdomen and pelvis in particular given her evidence of a possible ileus seen on exam and her somewhat cholestatic hepatic profile  She does have decubitus ulcers but they appear stable and she does not have evidence of deep involvement to suggest example sacral osteomyelitis.  She remains quite somnolent on exam does moan as if she is in pain but is difficult to me ascertain what part of her body is hurting.  She may have some shoulder tenderness bilaterally but is not clear to me.  We can certainly send other FUO labs and will do so  If there is no evidence of pulmonary pathology I would prefer great of the Zyvox we certainly  do not need it for the VRE that was in the urine.  Hopefully her cognition will improve and we can interact with her more to ascertain what types of symptoms she is suffering from besides the fevers that we have been measuring in the labs that we have  been following.           Rhina Brackett Dam 12/15/2020, 2:09 PM

## 2020-12-15 NOTE — Consult Note (Addendum)
NAME:  Lauren Barber, MRN:  [id number], DOB:  14-Mar-1956, LOS: 3 ADMISSION DATE:  12/11/2020, CONSULTATION DATE:  12/15/20 REFERRING MD:  FPTS , CHIEF COMPLAINT:  AMS, AKI  Brief History:  64 yo bedbound F  admitted to [facility] 1/23 with encephalopathy, AKI on CKD. Assessed by primary 1/27 to have GCS 4, hypotension. PCCM consulted in this setting   History of Present Illness:  65 yo F w extensive PMH including prior CVA, chronic bedbound state, CKDIIIb, obesity, DM II, Depression, hypothyroidism, chronic pain who presented to ED from [facility] 1/23 with AMS. Notably, pt presented to ED from facility 1/19 for AMS, dehydration, foul smelling urine. Was dc to facility with Keflex after IVF.  At baseline, pt is reportedly AAOx4.  On presentation 1/23 found to have AKI on CKD with mild rhabdo and hyperkalemia, hypotension requiring IVF c/b difficult IV access, UTI. Admitted to [facility], Nephrology consulted-- poor candidate for long term HD.  Pt mentation and BP somewhat improved 1/24-26, however on 1/27 pt assessed by primary team to have GCS of 4 and hypotension SBP 80s-- PCCM consulted in this setting.   Past Medical History:  CVA Depression Insomnia Bedridden  CKD IIIb DMII Chronic pain Dyslipidemia Constipation, slow transit Anemia Obesity HTN DDD Gout Peripheral neuropathy  Functional L sided megacolon  Hypothyroidism  Significant Hospital Events:  1/23 admitted to [facility], nephro consulted. AMS, AKI on CKD HyperK Rhabdo UTI Hypotension. CT H no evidence of acute CVA 1/25 MRI brain with chronic small vessel ischemic changes, no acute changes  1/26 late night given amp of bicarb, continued on D51/2 for hyperNa 1/27 LA 3.8, abx broadened to linezolid, zosyn. BP soft, worsening mental status, PCCM consulted. Primary has reswabbed for COVID   Consults:  Nephrology PCCM  Procedures:    Significant Diagnostic Tests:   1/23 CT H> old bilateral basal ganglia and lacunar infarcts. Age  related atrophy, chronic small vessel ischemic changes.  1/23 renal US> no hydronephrosis  1/25 MRI Brain- limited by motion degradation. chronic small   vessel ischemic changes, no acute change  Micro Data:  1/23 COVID neg, 1/27 COVID neg  1/23 BCx> staph species in aerobic bottle only 1/23 UCx> Pseudomonas >100,000. Vanc resistant enterococcus >50,000 1/27 BCx>>    Antimicrobials:  1/27 linezolid> 1/27 zosyn>   Interim History / Subjective:  Overnight abx broadened and repeat BCx acquired. Now zosyn zyvox  Has been off IV bicarb for orals  Today, pccm asked to consult in setting of hypotension and AMS  On arrival patient opens eyes, will stick out tongue to command and answering simply questions   Objective   Blood pressure (!) 80/60, pulse (!) 113, temperature 99.9 F (37.7 C), temperature source Rectal, resp. rate (!) 24, height 5' 9" (1.753 m), weight 117.1 kg, SpO2 100 %.        Intake/Output Summary (Last 24 hours) at 12/15/2020 0854 Last data filed at 12/15/2020 0848 Gross per 24 hour  Intake 2416.5 ml  Output 900 ml  Net 1516.5 ml   Filed Weights   12/14/20 0350 12/15/20 0001 12/15/20 0448  Weight: 114.2 kg 118.8 kg 117.1 kg    Examination: General: Chronically and acutely ill appearing older adult F, reclined in bed NAD HENT: NCAT. Anicteric sclera. Pink tacky mm. Dry lips with taught skin. Dry appearing tongue.  Lungs: Diminished breath sounds bilaterally. Mildly increased rate, no accessory use on RA. Symmetrical chest expansion  Cardiovascular: tachycardic rate reg rhythm. S1s2. Cap refill < 3 seconds  Abdomen: Obese, soft non tender  Extremities: No obvious acute joint deformity. No cyanosis or clubbing  Neuro: Drowsy-- Awakens to voice, falls asleep easily. Oriented to self. Opens eyes to command and tactile stimulation, sticks out tongue to command. Answering simple questions. Does not move extremities to command   GU: defer  Resolved Hospital Problem  list     Assessment & Plan:   Acute encephalopathy, metabolic -AKI with uremia, acidosis, UTI -does have prior CVA but baseline is reportedly AAOx4  -CT H and MRI (1/23 and 1/25 respectively) without intracranial pathology to explain encephalopathy  P -AKI, acidosis, UTI as below -delirium precautions -continue monitoring neuro status -I have ordered ammonia as well   AKI on CKD IIIb -prerenal from hypovolemia + likely ATN component, UTI, hypotension -clinically seems dehydrated 1/27 -BUN 131 Cr 3.24 on 1/27 Hyperkalemia, improved  Hypernatramia Rhabdo, mild  - CK was improving 1/24 but not checked since  P -nephro following, poor long term HD candidate -IVF -- is currently on d51/2 at 125/hr for hypernatremia  -recheck CK  -Strict I/O  Metabolic Acidosis -serum bicarb 14. AG 15 -AKI Lactic Acidosis P -possible sepsis as below -AKI as above, nephro following and managing Bicarb supplementation, will defer possible changes to them   UTI -PsA >100,000, vanc resistant enterococcus 50,000  Possible sepsis -no leukocytosis on last CBC, was febrile x1 overnight to 101.9. Abx broadened to zyvox zosyn by primary and repeat BCx acquired Positive BCx -1/23 BCx with staph x1- suspected contaminant P -check current CBC, PCT. In setting of AKI, PCT may not be of great utility (possible false elevation) but certainly could be helpful in de-escalating abx if reassuring  -abx per primary -dc airborne precautions.  -ID consulted by primary  Hypotension -hypovolemia, possible sepsis. Appears to have had low IVF 1/26 (18m in I/Os) -known poor PO intake prior to admission -seems to be somewhat fluid responsive as BP improved 91 with bolus IVF and HR with some improvement 114 to 98 P -1L NS bolus  -suspect will need continued IVF. Has poor baseline nutr state, third spacing likely even if intravascularly deplete.  -abx as above per primary   Coagulopathy, mild -I am not seeing  home med to explain this. On 1/23 INR 1.3, now on 1/27 1.7 -coupled with AMS, AKI, possible hepatic process? P -I have ordered LFTs -Ammonia ordered as above   Hypothyroidism -synthroid   DMII -SSI  Bedridden status Obesity with poor nutrition status  Sacral wound P -turn, WOC consulted, RDN consulted  -destin ointment     Thank you for consulting PCCM. Patient does not currently require ICU level of care. We will sign off. Please re-engage if patient's clinical status changes or if we can be of further assistance otherwise.   Best practice (evaluated daily)  Diet: npo -- rdn consulted  Pain/Anxiety/Delirium protocol (if indicated): na VAP protocol (if indicated): na DVT prophylaxis: heparin GI prophylaxis: na  Glucose control:  Mobility: turn q2 Disposition:SDU   Goals of Care:  Last date of multidisciplinary goals of care discussion: Per primary -- FPTS  Family and staff present: -- Summary of discussion: -- Follow up goals of care discussion due: -- Code Status: Full   Labs   CBC: Recent Labs  Lab 12/11/20 1313 12/12/20 0528 12/14/20 1004  WBC 7.7 6.6 9.0  NEUTROABS 4.9  --   --   HGB 12.1 10.6* 10.1*  HCT 39.2 35.6* 32.0*  MCV 96.8 99.4 97.3  PLT 233 175  139*    Basic Metabolic Panel: Recent Labs  Lab 12/11/20 2304 12/12/20 0632 12/12/20 1547 12/13/20 0427 12/14/20 0340 12/14/20 2203 12/15/20 0100  NA 141   < > 145 145 150* 150* 150*  K 5.5*   < > 4.7 3.8 4.4 5.3* 4.3  CL 113*   < > 116* 107 119* 123* 121*  CO2 13*   < > 16* 24 16* 11* 14*  GLUCOSE 96   < > 97 84 101* 126* 135*  BUN 155*   < > 138* 119* 133* 134* 131*  CREATININE 4.58*   < > 3.69* 2.85* 3.11* 3.16* 3.24*  CALCIUM 9.2   < > 9.6 8.1* 9.6 8.8* 8.7*  PHOS 6.5*  --  5.3*  --   --  3.7 3.7   < > = values in this interval not displayed.   GFR: Estimated Creatinine Clearance: 24.6 mL/min (A) (by C-G formula based on SCr of 3.24 mg/dL (H)). Recent Labs  Lab 12/11/20 1313  12/11/20 2303 12/12/20 0528 12/14/20 1004 12/15/20 0100 12/15/20 0643  WBC 7.7  --  6.6 9.0  --   --   LATICACIDVEN 1.4 1.1  --   --  3.8* 3.9*    Liver Function Tests: Recent Labs  Lab 12/11/20 1313 12/11/20 2304 12/12/20 0254 12/12/20 1547 12/13/20 0427 12/14/20 0340 12/14/20 2203 12/15/20 0100  AST 192*  --  130* 127* 82* 70*  --   --   ALT 274*  --  219* 216* 153* 145*  --   --   ALKPHOS 174*  --  136* 146* 116 151*  --   --   BILITOT 0.5  --  0.6 0.7 0.7 1.1  --   --   PROT 8.8*  --  7.6 7.8 6.1* 7.3  --   --   ALBUMIN 3.5   < > 2.8* 2.8* 2.2* 2.4* 2.0* 2.0*   < > = values in this interval not displayed.   No results for input(s): LIPASE, AMYLASE in the last 168 hours. Recent Labs  Lab 12/11/20 2302  AMMONIA 37*    ABG    Component Value Date/Time   PHART 7.353 10/24/2013 0246   PCO2ART 31.1 (L) 10/24/2013 0246   PO2ART 91.0 10/24/2013 0246   HCO3 16.4 (L) 12/15/2020 0142   TCO2 18 10/24/2013 0246   ACIDBASEDEF 9.0 (H) 12/15/2020 0142   O2SAT 97.9 12/15/2020 0142     Coagulation Profile: Recent Labs  Lab 12/11/20 1244 12/15/20 0100  INR 1.3* 1.7*    Cardiac Enzymes: Recent Labs  Lab 12/11/20 1313 12/12/20 0632  CKTOTAL 2,474* 1,997*    HbA1C: Hemoglobin A1C  Date/Time Value Ref Range Status  02/18/2017 12:00 AM 6.3  Final  11/22/2016 12:00 AM 6.9  Final   Hgb A1c MFr Bld  Date/Time Value Ref Range Status  12/12/2020 05:28 AM 6.2 (H) 4.8 - 5.6 % Final    Comment:    (NOTE) Pre diabetes:          5.7%-6.4%  Diabetes:              >6.4%  Glycemic control for   <7.0% adults with diabetes     CBG: Recent Labs  Lab 12/14/20 1123 12/14/20 1559 12/14/20 2052 12/14/20 2057 12/15/20 0617  GLUCAP 93 122* 116* 123* 105*    Review of Systems:   Unable to reliably obtain due to encephalopathy Denies SOB and pain   Past Medical History:  She,  has  a past medical history of Anemia, Anxiety, Atopic conjunctivitis, Cardiomegaly,  Cellulitis, Cellulitis of right thigh (08/13/2016), Chronic kidney disease, CVA (cerebral vascular accident) ([place]), Degenerative, intervertebral disc, Depressive disorder, Diabetes mellitus without complication ([place]), Endometriosis, Essential hypertension (04/12/2007), Gout, Headache(784.0), History of blood transfusion (1989), Hypertension, Hypothyroidism, Insomnia, Neuromuscular disorder ([place]), Obesities, morbid ([place]), Stroke ([place]) (2013), and Vaginal bleeding.   Surgical History:   Past Surgical History:  Procedure Laterality Date  . BIOPSY ENDOMETRIAL N/A 10 03 2013  . [place]   x 2  . COLONOSCOPY WITH PROPOFOL N/A 11/14/2017   Procedure: COLONOSCOPY WITH PROPOFOL;  Surgeon: Milus Banister, MD;  Location: [facility];  Service: Endoscopy;  Laterality: N/A;  . DILATION AND CURETTAGE OF UTERUS  2013  . HYSTEROSCOPY WITH D & C N/A 06/28/2014   Procedure: DILATATION AND CURETTAGE /HYSTEROSCOPY;  Surgeon: Lavonia Drafts, MD;  Location: [facility];  Service: Gynecology;  Laterality: N/A;  . INCISIONAL HERNIA REPAIR N/A 09/26/2017   Procedure: DIAGNOSTIC LAPAROSCOPY;  Surgeon: Michael Boston, MD;  Location: [facility];  Service: General;  Laterality: N/A;  . TONSILLECTOMY    . WISDOM TOOTH EXTRACTION       Social History:   reports that she quit smoking about 8 years ago. Her smoking use included cigarettes. She has a 43.00 pack-year smoking history. She has never used smokeless tobacco. She reports that she does not drink alcohol and does not use drugs.   Family History:  Her family history includes Hypertension in her father and mother.   Allergies Allergies  Allergen Reactions  . Nsaids Other (See Comments)    CKD Cr 2  . Shellfish Allergy Other (See Comments)    Per MAR     Home Medications  Prior to Admission medications   Medication Sig Start Date End Date Taking? Authorizing Provider  allopurinol (ZYLOPRIM) 100 MG tablet Take 200 mg by mouth daily.   Yes  [provider]  amitriptyline (ELAVIL) 75 MG tablet Take 75 mg by mouth at bedtime. 11/22/20  Yes [provider]  aspirin EC 81 MG tablet Take 81 mg by mouth daily. Swallow whole.   Yes [provider]  atorvastatin (LIPITOR) 20 MG tablet Take 20 mg by mouth every evening. 11/18/20  Yes [provider]  cephALEXin (KEFLEX) 250 MG capsule Take 1 capsule (250 mg total) by mouth 3 (three) times daily. 12/07/20  Yes Hayden Rasmussen, MD  desvenlafaxine (PRISTIQ) 100 MG 24 hr tablet Take 100 mg by mouth daily. 12/03/20  Yes [provider]  famotidine (PEPCID) 20 MG tablet Take 20 mg by mouth daily. 12/08/20  Yes [provider]  FLUoxetine HCl (PROZAC PO) Take 50 mg by mouth daily.   Yes [provider]  gabapentin (NEURONTIN) 600 MG tablet Take 600 mg by mouth 3 (three) times daily.   Yes [provider]  JANUVIA 50 MG tablet Take 50 mg by mouth daily. 12/08/20  Yes [provider]  levothyroxine (SYNTHROID, LEVOTHROID) 50 MCG tablet Take 1 tablet (50 mcg total) by mouth daily. 11/18/15  Yes Gerlene Fee, NP  lubiprostone (AMITIZA) 8 MCG capsule Take 8 mcg by mouth every evening. 12/05/20  Yes [provider]  Menthol, Topical Analgesic, (BIOFREEZE ROLL-ON EX) Apply 1 application topically 4 (four) times daily. Apply to joints four times a day for joint pain.   Yes [provider]  MITIGARE 0.6 MG CAPS Take 1 capsule by mouth in the morning and at  bedtime. 12/05/20  Yes [provider]  polyethylene glycol (MIRALAX / GLYCOLAX) packet Take 34 g 3 (three) times daily by mouth. Patient taking differently: Take 17 g by mouth daily. 09/27/17  Yes Patrecia Pour, MD  sennosides-docusate sodium (SENOKOT-S) 8.6-50 MG tablet Take 2 tablets 2 (two) times daily by mouth. 09/27/17  Yes Patrecia Pour, MD  spironolactone (ALDACTONE) 25 MG tablet Take 25 mg by mouth daily.   Yes [provider]   tiZANidine (ZANAFLEX) 2 MG tablet Take 2 mg 4 (four) times daily by mouth.   Yes [provider]  torsemide (DEMADEX) 10 MG tablet Take 10 mg by mouth daily.    Yes [provider]  acetaminophen (TYLENOL) 325 MG tablet Take 650 mg every 6 (six) hours as needed by mouth for headache.     [provider]  bisacodyl (DULCOLAX) 10 MG suppository Place 1 suppository (10 mg total) daily rectally. Patient not taking: Reported on 12/11/2020 09/28/17   Patrecia Pour, MD  Carboxymethylcellulose Sodium (REFRESH TEARS OP) Place 1 drop into both eyes every 4 (four) hours as needed (for dry eyes).    [provider]  EPINEPHrine (EPI-PEN) 0.3 mg/0.3 mL DEVI Inject 0.3 mg daily as needed into the muscle (allergic reaction).     [provider]  HYDROcodone-acetaminophen (NORCO/VICODIN) 5-325 MG tablet Take 1 tablet daily as needed by mouth for moderate pain. Patient not taking: Reported on 12/11/2020 09/27/17   Patrecia Pour, MD  metoCLOPramide (REGLAN) 10 MG tablet Take 1 tablet (10 mg total) by mouth as directed. Patient not taking: Reported on 12/11/2020 10/24/17   Levin Erp, PA  Na Sulfate-K Sulfate-Mg Sulf (SUPREP BOWEL PREP KIT) 17.5-3.13-1.6 GM/177ML SOLN Take 1 kit by mouth as directed. Patient not taking: Reported on 12/11/2020 10/24/17   Levin Erp, PA  pantoprazole (PROTONIX) 40 MG tablet Take 1 tablet (40 mg total) by mouth daily. Patient not taking: Reported on 12/11/2020 12/19/14   Rai, Vernelle Emerald, MD  polyvinyl alcohol (LIQUIFILM TEARS) 1.4 % ophthalmic solution Place 1 drop into both eyes every 4 (four) hours as needed for dry eyes.    [provider]  psyllium (HYDROCIL/METAMUCIL) 95 % PACK Take 1 packet 2 (two) times daily by mouth. Patient not taking: Reported on 12/11/2020 09/27/17   Patrecia Pour, MD  sodium chloride (OCEAN) 0.65 % SOLN nasal spray Place 1 spray into both nostrils as needed for congestion.    [provider]  TRULICITY 1.5 HW/3.8UE SOPN Inject 1.5 mg into the skin once a week. Thursdays 10/24/20   [provider]  Vitamin D, Ergocalciferol, (DRISDOL) 50000 units CAPS capsule Take 50,000 Units by mouth every Wednesday.     [provider]     CCT: n/a   Eliseo Gum MSN, AGACNP-BC [employer] [phone] If no answer, [phone] 12/15/2020, 11:06 AM

## 2020-12-15 NOTE — Progress Notes (Signed)
Lab and respiratory paged for STAT labs. Tylenol supp to be given ASAP after blood cultures drawn.

## 2020-12-15 NOTE — Progress Notes (Signed)
Interim Progress Note  Patient's LA returned elevated at 3.8, was previously normal. VBG with pH at lower end of normal at 7.28, CO2 low at 36 (patient tachypneic), Bicarb low at 16.4. Patient was scheduled to received Sodium Bicarb BID on 01/26 but this was not given as patient is NPO. She has already received 1 amp of Sodium-Bicarb this evening.   Most recent temp 101.9*F, HR 120s, RR >20, last WBC ~9, however there is suspected source of infection (UTI) and LA is elevated at 3.8, therefore patient meeting criteria for Severe Sepsis.   Physical exam: General: patient sleeping, remains difficult to arouse but is easier to awaken in comparison to initial presentation to the hospital Respiratory: tachypneic up to 25 respirations per min, distant breath sounds are clear, moving air well Cardio: Rate 109-122 bpm, regular rhythm, clear S1S2, good peripheral pulses Integumentary: skin breakdown to buttocks appears slightly better than before, no obvious sign of infection at this point  Extremities: warm, well perfused  Updates to plan: -Broadening antibiotics, initiate Linezolid IV for MRSA coverage (Zosyn covering just about everything else, patient with history of + MRSA-PCR) -Chest Xray (ordered 2 view, if patient cannot awaken enough to cooperate with 2 view can perform 1 view) -Repeat COVID Test -follow up repeat blood cultures -follow up repeat Lactic Acid -Continue Pulse Ox/Cardiac Monitoring -Tylenol PRN Fevers   Milus Banister, [place], PGY-3 12/15/2020 3:05 AM

## 2020-12-15 NOTE — Consult Note (Signed)
RT tried 3x times for abg and not enough blood can be obtained, VBG if possible RN aware.

## 2020-12-15 NOTE — Progress Notes (Addendum)
ABG unable to be obtained, order placed for VBG. Thank you!  FAMILY MEDICINE TEACHING SERVICE Patient - Please contact intern pager [phone] (via phone or AMION, login: [email or web address]) for questions regarding care. Text pages welcome. DO NOT page or secure chat the listed attending provider unless there is no answer from the number above.   Milus Banister, [place], PGY-3 12/15/2020 1:37 AM

## 2020-12-15 NOTE — Progress Notes (Signed)
  Echocardiogram 2D Echocardiogram has been performed with [facility].  Lauren Barber 12/15/2020, 5:16 PM

## 2020-12-15 NOTE — Progress Notes (Signed)
[facility] Progress Note   Subjective:   Febrile, hypotensive overnight.  NS bolus being given. Awake but less alert today.  Ins yesterday 0.1L/UOP 985mL  ABG 7.30 / 31/88, venous lactate3.8 CXR no new dz, repeat covid neg, bld cx pending AM labs Na 150, K 4.3, BUN 131, Cr 8.7   Objective Vitals:   12/15/20 0500 12/15/20 0600 12/15/20 0700 12/15/20 0800  BP:  (!) 88/60  (!) 80/60  Pulse:    (!) 113  Resp: (!) 26 (!) 24 (!) 22 (!) 24  Temp:    99.9 F (37.7 C)  TempSrc:    Rectal  SpO2: 100% 100% 100% 100%  Weight:      Height:       Physical Exam General: lying flat in bed drowsy Heart: tachy to low 100s on exam, no rub Lungs: normal wob on [place], clear ant  Abdomen: soft, obese Extremities: no edema Neuro:  Drowsy, mumbles  Additional Objective Labs: Basic Metabolic Panel: Recent Labs  Lab 12/12/20 1547 12/13/20 0427 12/14/20 0340 12/14/20 2203 12/15/20 0100  NA 145   < > 150* 150* 150*  K 4.7   < > 4.4 5.3* 4.3  CL 116*   < > 119* 123* 121*  CO2 16*   < > 16* 11* 14*  GLUCOSE 97   < > 101* 126* 135*  BUN 138*   < > 133* 134* 131*  CREATININE 3.69*   < > 3.11* 3.16* 3.24*  CALCIUM 9.6   < > 9.6 8.8* 8.7*  PHOS 5.3*  --   --  3.7 3.7   < > = values in this interval not displayed.   Liver Function Tests: Recent Labs  Lab 12/12/20 1547 12/13/20 0427 12/14/20 0340 12/14/20 2203 12/15/20 0100  AST 127* 82* 70*  --   --   ALT 216* 153* 145*  --   --   ALKPHOS 146* 116 151*  --   --   BILITOT 0.7 0.7 1.1  --   --   PROT 7.8 6.1* 7.3  --   --   ALBUMIN 2.8* 2.2* 2.4* 2.0* 2.0*   No results for input(s): LIPASE, AMYLASE in the last 168 hours. CBC: Recent Labs  Lab 12/11/20 1313 12/12/20 0528 12/14/20 1004  WBC 7.7 6.6 9.0  NEUTROABS 4.9  --   --   HGB 12.1 10.6* 10.1*  HCT 39.2 35.6* 32.0*  MCV 96.8 99.4 97.3  PLT 233 175 139*   Blood Culture    Component Value Date/Time   SDES IN/OUT CATH URINE 12/11/2020 1219   SPECREQUEST   12/11/2020 1219    NONE Performed at [facility], [place] [address] [place],  [address]    CULT (A) 12/11/2020 1219    >=100,000 COLONIES/mL PSEUDOMONAS AERUGINOSA 50,000 COLONIES/mL VANCOMYCIN RESISTANT ENTEROCOCCUS ISOLATED    REPTSTATUS 12/14/2020 FINAL 12/11/2020 1219    Cardiac Enzymes: Recent Labs  Lab 12/11/20 1313 12/12/20 0632  CKTOTAL 2,474* 1,997*   CBG: Recent Labs  Lab 12/14/20 1123 12/14/20 1559 12/14/20 2052 12/14/20 2057 12/15/20 0617  GLUCAP 93 122* 116* 123* 105*   Iron Studies: No results for input(s): IRON, TIBC, TRANSFERRIN, FERRITIN in the last 72 hours. @lablastinr3 @ Studies/Results: MR BRAIN W WO CONTRAST  Result Date: 12/13/2020 CLINICAL DATA:  Worsening mental status. EXAM: MRI HEAD WITHOUT AND WITH CONTRAST TECHNIQUE: Multiplanar, multiecho pulse sequences of the brain and surrounding structures were obtained without and with intravenous contrast. CONTRAST:  39mL GADAVIST  GADOBUTROL 1 MMOL/ML IV SOLN COMPARISON:  Head CT 12/11/2020 FINDINGS: Brain: The study suffers from motion degradation. Diffusion imaging does not show any acute or subacute infarction. There are chronic small-vessel changes of the pons. There are a few old small vessel cerebellar infarctions. Cerebral hemispheres show old infarction in the right caudate and chronic small-vessel ischemic changes affecting the hemispheric deep white matter. No large vessel territory infarction. No mass lesion, hemorrhage, hydrocephalus or extra-axial collection. After contrast administration, no abnormal enhancement is seen. Vascular: Major vessels at the base of the brain show flow. Skull and upper cervical spine: Negative Sinuses/Orbits: Clear except for some fluid in the posterior ethmoid region on the left. Orbits appear unremarkable as seen. Other: None IMPRESSION: Marked motion degradation. No acute or reversible finding. Chronic small-vessel ischemic changes throughout the brain as  outlined above. Electronically Signed   By: Nelson Chimes M.D.   On: 12/13/2020 13:21   DG CHEST PORT 1 VIEW  Result Date: 12/15/2020 CLINICAL DATA:  Sepsis EXAM: PORTABLE CHEST 1 VIEW COMPARISON:  12/11/2020 FINDINGS: Left basilar scarring unchanged. Pulmonary insufflation is stable. No superimposed focal pulmonary infiltrate. No pneumothorax or pleural effusion. Cardiac size is at the upper limits of normal. IMPRESSION: No active disease. Electronically Signed   By: Fidela Salisbury MD   On: 12/15/2020 06:35   Medications: . sodium chloride Stopped (12/15/20 0450)  . dextrose 5 % and 0.45% NaCl 125 mL/hr at 12/15/20 0848  . piperacillin-tazobactam (ZOSYN)  IV 12.5 mL/hr at 12/15/20 0848  . sodium chloride     . heparin  5,000 Units Subcutaneous Q8H  . insulin aspart  0-6 Units Subcutaneous TID WC  . liver oil-zinc oxide   Topical BID  . sodium bicarbonate  1,300 mg Oral BID    Assessment/Plan: **AKI on CKD 4:  Severe AKI in setting of UTI and AMS x 1 week prior ? Prerenal with poor po intake, ATN with sepsis from UTI.  CK modestly elevated as well.  Renal US without obstruction; kidneys small.  Last Cr for comparison 09/2017 2.1.  Cr 5.5 on presentation and has improved some with supportive care now in the low 3s with BUN 130s.   Being given isotonic bolus now for hypotension, will resume MIVF with D5 1/2NS as well with hypernatremia.  She seems to be a poor candidate for long term dialysis with SNF bound and numerous comorbids. For now would continue supportive care and hopefully BUN/Cr will continue to improve.  Certainly part of her mental status could be uremia but its likely multifactorial.  I had a lengthy discussion with her mother yesterday via phone - she's familiar with dialysis because patient's son (Ernesto's son) does HD locally now x 2 months.  She is in agreement that we will pursue conservative care should need for dialysis arise.    **AMS: Suspect multifactorial, uremia  certainly could be playing part per above.  Primary holding neuroactive meds currently too and treating underlying infection.    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  Switch to D5 1/2NS.   **Anemia: mild in 10s, no need for ESA.   Will follow.  She seems clinically worse - would continue to address goals of care with family.  Per above, poor dialysis candidate and mother declines that treatment for.  She has been clear she doesn't want  Alailah to suffer.   Jannifer Hick MD 12/15/2020, 9:56 AM  [facility] Pager: [phone]

## 2020-12-15 NOTE — Progress Notes (Addendum)
Family Medicine Teaching Service Daily Progress Note Intern Pager: [phone]  Patient name: Lauren Barber Medical record number: [id number] Date of birth: 10/10/1956 Age: 65 y.o. Gender: female  Primary Care Provider: Salome Arnt, [place] Consultants: Nephrology Code Status: Full  Pt Overview and Major Events to Date:  1/23: admitted 1/25: started Zosyn 1/27: started Linezolid  Assessment and Plan:  Lauren Harrisonis a 65 y.o.femalep[facility]ing [facility].PMH is significant for CKD, T2DM, depression, GERD, hyperlipidemia, hypothyroidism, history of CVA, sacral ulcers.  Severe Sepsis  Urinary Tract Infection Patient febrile overnight to 101.9. Her mental status this morning is worse from prior (grimaces to painful stimuli, otherwise non-responsive, not following commands). Remains tachycardic, mildly tachypneic, and hypotensive (MAPs 60s over past 24h). Lactic acid elevated to 3.8>3.9.  Urine culture grew pansensitive Pseudomonas and vancomycin-resistant Enterococcus-- has been on Zosyn since 1/25 and was broadened to Linezolid overnight. Blood cx grew 1/4  Staph species, thought to be contaminant. CXR unremarkable. -Repeat COVID swab pending -CCM consulted, appreciate recommendations -Will contact ID, appreciate recommendations -Continue Linezolid and Zosyn for now -f/u repeat culture results -Monitor vitals closely -Will obtain ABG -Continue IV fluids (s/p 1L 1/2NS bolus overnight, on D5 1/2 NS @ 125 cc/hr)  Acute Renal Failure on CKD Creatinine 3.24 this morning, BUN 131, est GFR 16. Creatinine 5.5 on admission (baseline ~2). 900cc's UOP yesterday.  -Nephro following, appreciate recommendations -Per nephro, patient is poor dialysis candidate -Sodium bicarb ordered but patient remains NPO (consider giving additional amps of IV, will await nephro recs) -Continue D5 1/2 NS at 125 cc/hr  Altered Mental Status Worse from prior. Essentially  unresponsive on exam. Thought to be secondary to sepsis as well as uremic encephalopathy. -CCM consulted, appreciate recommendations -May require intubation if mentation does not improve -Will check ABG -Continue to monitor closely -Treatment of sepsis and renal failure as above  Hypernatremia Na remains elevated at 150.  -Continue D5 1/2NS @ 125 cc/hr -Repeat BMP this afternoon  Hypotension  Hx of Hypertension Patient has been hypotensive over past 24h secondary to sepsis. MAPs in the 60s. Most recent BP 88/60. Takes torsemide 10mg  daily and spironolactone 25mg  daily at home for hypertension. -Holding home meds in the setting of hypotension  T2DM Very well controlled- A1c 6.2%. Glucose 135 on am BMP. Patient has remained NPO since admission due to altered mental status. Home meds: Januvia and Trulicity -Holding home meds -CBG monitoring  Severe Protein Calorie Malnutrition Albumin 2.0.  -Consider nutrition consult when patient is more stable -Remains NPO for now due to altered mental status  History of CVA  Hyperlipidemia Patient bedbound at baseline 2/2 deficits from prior stroke. On Atorvastatin 20mg  daily and ASA 81 mg daily at home. Also on tizanidine 2mg  QID for spasticity -Holding home meds due to NPO status and altered mental status  Hypothyroidism Chronic, stable. Home medications include Synthroid 50 mcgdaily. [place] admission (2.832). -Will start IV Synthroid as she remains NPO  Depression Home meds include fluoxetine 50mg  daily and desvenlafaxine 100mg  daily -Holding home meds due to NPO status  Gout Chronic, stable.Home medications include colchicine 0.6 mgBID andallopurinol 200 mg/day. -Holding home medications for now  GERD Chronic, stable.Home medications include Pepcid 20 mg/day -Holding home medications due to altered mental status  Sacral Skin Changes Patient was evaluated by wound care- her sacral skin changes deemed to be scar tissue,  not active lesions. -Desitin prn   FEN/GI: NPO (failed swallow eval 1/26) PPx: Heparin   Status is: Inpatient Remains inpatient appropriate  because:Ongoing diagnostic testing needed not appropriate for outpatient work up and Inpatient level of care appropriate due to severity of illness   Dispo:  Patient From: [place]  Planned Disposition: [place]  Expected discharge date: 12/19/2020  Medically stable for discharge: No      Subjective:  Patient unable to provide any history this morning. Responsive only to painful stimuli  Objective: Temp:  [97.1 F (36.2 C)-101.9 F (38.8 C)] 101.9 F (38.8 C) (01/27 0144) Pulse Rate:  [116] 116 (01/26 2141) Resp:  [20-26] 24 (01/27 0600) BP: (83-94)/(53-60) 88/60 (01/27 0600) SpO2:  [95 %-100 %] 100 % (01/27 0600) Weight:  [117.1 kg-118.8 kg] 117.1 kg (01/27 0448) Physical Exam: General: obese female laying completely still in bed with eyes closed HEENT: PERRL Cardiovascular: tachycardic, regular rhythm, normal S1/S2 Respiratory: tachypneic but shallow breathing, lung exam limited by body habitus and non-cooperative with exam Abdomen: obese, soft, nondistended Extremities: 1+ nonpitting pedal edema bilaterally, unchanged from prior Neuro: grimaces to sternal rub, otherwise does not follow any commands and is essentially unresponsive  Laboratory: Recent Labs  Lab 12/11/20 1313 12/12/20 0528 12/14/20 1004  WBC 7.7 6.6 9.0  HGB 12.1 10.6* 10.1*  HCT 39.2 35.6* 32.0*  PLT 233 175 139*   Recent Labs  Lab 12/12/20 1547 12/13/20 0427 12/14/20 0340 12/14/20 2203 12/15/20 0100  NA 145 145 150* 150* 150*  K 4.7 3.8 4.4 5.3* 4.3  CL 116* 107 119* 123* 121*  CO2 16* 24 16* 11* 14*  BUN 138* 119* 133* 134* 131*  CREATININE 3.69* 2.85* 3.11* 3.16* 3.24*  CALCIUM 9.6 8.1* 9.6 8.8* 8.7*  PROT 7.8 6.1* 7.3  --   --   BILITOT 0.7 0.7 1.1  --   --   ALKPHOS 146* 116 151*  --   --   ALT 216* 153*  145*  --   --   AST 127* 82* 70*  --   --   GLUCOSE 97 84 101* 126* 135*  Lactic acid 3.8>3.9 Repeat COVID pending  Imaging/Diagnostic Tests: DG CHEST PORT 1 VIEW Result Date: 12/15/2020 CLINICAL DATA:  Sepsis EXAM: PORTABLE CHEST 1 VIEW COMPARISON:  12/11/2020 FINDINGS: Left basilar scarring unchanged. Pulmonary insufflation is stable. No superimposed focal pulmonary infiltrate. No pneumothorax or pleural effusion. Cardiac size is at the upper limits of normal. IMPRESSION: No active disease. Electronically Signed   By: Fidela Salisbury MD   On: 12/15/2020 06:35     Alcus Dad, MD 12/15/2020, 6:19 AM PGY-1, [place] Intern pager: [phone], text pages welcome

## 2020-12-15 NOTE — Progress Notes (Addendum)
Received concerning sign out on patient from night team. Martin Majestic to evaluate patient.  Vitals- sinus tachy ~120, RR19, SBP 80.  Patient is unresponsive to verbal responses. Only slight grimace of face with sternal rub or moving her left arm. Cannot follow any commands. Bilateral pupils PERRL. Did not appear to have labored breathing. Took of the [place] and remained >95% O2 saturation ORA. Lung exam limited from no patient participation, positioning and body habitus. Heart- no murmur, rapid rate.  Extremities well perfused. Did not see infective endocarditis signs on exam.  Patient had not yet received linezolid. Labs as previously stated by night team. Repeat covid and blood cultures pending. GCS 4. CCM consulted and will go to see.  ID consulted per attending recs Continue IV fluids D5 1/2. 900cc UOP yesterday. Repeat ammonia. Has been NPO since admission 1/24.

## 2020-12-15 NOTE — Progress Notes (Signed)
FPTS Interim Progress Note  Attempted to contact patient's mother x3 with no success. Voicemail not set up at this time, so unable to leave message. Would like to provide her with an update as well as discuss palliative consultation/goals of care. Will try again as time allows.  Alcus Dad, MD 12/15/2020, 4:21 PM PGY-1, [place] [facility] Service pager [phone]

## 2020-12-16 DIAGNOSIS — Z7189 Other specified counseling: Secondary | ICD-10-CM

## 2020-12-16 DIAGNOSIS — Z66 Do not resuscitate: Secondary | ICD-10-CM

## 2020-12-16 DIAGNOSIS — N178 Other acute kidney failure: Secondary | ICD-10-CM

## 2020-12-16 DIAGNOSIS — E875 Hyperkalemia: Secondary | ICD-10-CM | POA: Diagnosis not present

## 2020-12-16 DIAGNOSIS — R4182 Altered mental status, unspecified: Secondary | ICD-10-CM | POA: Diagnosis not present

## 2020-12-16 DIAGNOSIS — N179 Acute kidney failure, unspecified: Secondary | ICD-10-CM | POA: Diagnosis not present

## 2020-12-16 LAB — EBV AB TO VIRAL CAPSID AG PNL, IGG+IGM
EBV VCA IgG: >600 U/mL — ABNORMAL HIGH (ref 0.0–17.9)
EBV VCA IgM: <36 U/mL (ref 0.0–35.9)

## 2020-12-16 LAB — CULTURE, BLOOD (ROUTINE X 2): Culture: NO GROWTH

## 2020-12-16 LAB — GLUCOSE, CAPILLARY
Glucose-Capillary: 97 mg/dL (ref 70–99)
Glucose-Capillary: 93 mg/dL (ref 70–99)
Glucose-Capillary: 95 mg/dL (ref 70–99)
Glucose-Capillary: 97 mg/dL (ref 70–99)
Glucose-Capillary: 89 mg/dL (ref 70–99)

## 2020-12-16 LAB — CBC
HCT: 26.6 % — ABNORMAL LOW (ref 36.0–46.0)
Hemoglobin: 8.4 g/dL — ABNORMAL LOW (ref 12.0–15.0)
MCH: 31.3 pg (ref 26.0–34.0)
MCHC: 31.6 g/dL (ref 30.0–36.0)
MCV: 99.3 fL (ref 80.0–100.0)
Platelets: 101 10*3/uL — ABNORMAL LOW (ref 150–400)
RBC: 2.68 MIL/uL — ABNORMAL LOW (ref 3.87–5.11)
RDW: 17.5 % — ABNORMAL HIGH (ref 11.5–15.5)
WBC: 6.8 10*3/uL (ref 4.0–10.5)
nRBC: 0.7 % — ABNORMAL HIGH (ref 0.0–0.2)

## 2020-12-16 LAB — BASIC METABOLIC PANEL
Anion gap: 17 — ABNORMAL HIGH (ref 5–15)
Anion gap: 16 — ABNORMAL HIGH (ref 5–15)
BUN: 109 mg/dL — ABNORMAL HIGH (ref 8–23)
BUN: 102 mg/dL — ABNORMAL HIGH (ref 8–23)
CO2: 16 mmol/L — ABNORMAL LOW (ref 22–32)
CO2: 18 mmol/L — ABNORMAL LOW (ref 22–32)
Calcium: 9 mg/dL (ref 8.9–10.3)
Calcium: 8.9 mg/dL (ref 8.9–10.3)
Chloride: 122 mmol/L — ABNORMAL HIGH (ref 98–111)
Chloride: 122 mmol/L — ABNORMAL HIGH (ref 98–111)
Creatinine, Ser: 2.62 mg/dL — ABNORMAL HIGH (ref 0.44–1.00)
Creatinine, Ser: 2.53 mg/dL — ABNORMAL HIGH (ref 0.44–1.00)
GFR, Estimated: 20 mL/min — ABNORMAL LOW (ref >60)
GFR, Estimated: 21 mL/min — ABNORMAL LOW (ref >60)
Glucose, Bld: 97 mg/dL (ref 70–99)
Glucose, Bld: 105 mg/dL — ABNORMAL HIGH (ref 70–99)
Potassium: 4 mmol/L (ref 3.5–5.1)
Potassium: 3.8 mmol/L (ref 3.5–5.1)
Sodium: 155 mmol/L — ABNORMAL HIGH (ref 135–145)
Sodium: 156 mmol/L — ABNORMAL HIGH (ref 135–145)

## 2020-12-16 LAB — CMV ANTIBODY, IGG (EIA): CMV Ab - IgG: >10 U/mL — ABNORMAL HIGH (ref 0.00–0.59)

## 2020-12-16 LAB — MAGNESIUM: Magnesium: 2.1 mg/dL (ref 1.7–2.4)

## 2020-12-16 LAB — PROCALCITONIN: Procalcitonin: 8.08 ng/mL

## 2020-12-16 LAB — LACTATE DEHYDROGENASE: LDH: 219 U/L — ABNORMAL HIGH (ref 98–192)

## 2020-12-16 LAB — CK: Total CK: 741 U/L — ABNORMAL HIGH (ref 38–234)

## 2020-12-16 LAB — PHOSPHORUS: Phosphorus: 3.8 mg/dL (ref 2.5–4.6)

## 2020-12-16 LAB — CMV IGM: CMV IgM: <30 AU/mL (ref 0.0–29.9)

## 2020-12-16 MED ORDER — SODIUM BICARBONATE 8.4 % IV SOLN
INTRAVENOUS | Status: DC
  Filled 2020-12-16 (×6): qty 50

## 2020-12-16 MED ORDER — SODIUM BICARBONATE 8.4 % IV SOLN
50.0000 meq | Freq: Once | INTRAVENOUS | Status: AC
  Administered 2020-12-16: 50 meq via INTRAVENOUS
  Filled 2020-12-16: qty 50

## 2020-12-16 NOTE — Progress Notes (Addendum)
Subjective: No new complaints. Patient's mental status improved somewhat today compared to yesterday. Still mumbles her words but able to answer questions after delayed thought process. She denies any fever, or pain.   Antibiotics:  Anti-infectives (From admission, onward)   Start     Dose/Rate Route Frequency Ordered Stop   12/15/20 0300  linezolid (ZYVOX) IVPB 600 mg        600 mg 300 mL/hr over 60 Minutes Intravenous  Once 12/15/20 0221 12/15/20 0418   12/13/20 1400  piperacillin-tazobactam (ZOSYN) IVPB 3.375 g        3.375 g 12.5 mL/hr over 240 Minutes Intravenous Every 8 hours 12/13/20 1249     12/12/20 1500  ceFEPIme (MAXIPIME) 2 g in sodium chloride 0.9 % 100 mL IVPB  Status:  Discontinued        2 g 200 mL/hr over 30 Minutes Intravenous Every 24 hours 12/11/20 1355 12/13/20 1054   12/11/20 1400  vancomycin (VANCOREADY) IVPB 2000 mg/400 mL        2,000 mg 200 mL/hr over 120 Minutes Intravenous  Once 12/11/20 1353 12/11/20 1700   12/11/20 1355  vancomycin variable dose per unstable renal function (pharmacist dosing)  Status:  Discontinued         Does not apply See admin instructions 12/11/20 1355 12/12/20 1017   12/11/20 1345  cefTRIAXone (ROCEPHIN) 1 g in sodium chloride 0.9 % 100 mL IVPB  Status:  Discontinued        1 g 200 mL/hr over 30 Minutes Intravenous  Once 12/11/20 1334 12/11/20 1344   12/11/20 1345  ceFEPIme (MAXIPIME) 2 g in sodium chloride 0.9 % 100 mL IVPB        2 g 200 mL/hr over 30 Minutes Intravenous  Once 12/11/20 1344 12/11/20 1557   12/11/20 1345  metroNIDAZOLE (FLAGYL) IVPB 500 mg        500 mg 100 mL/hr over 60 Minutes Intravenous  Once 12/11/20 1344 12/11/20 1610   12/11/20 1345  vancomycin (VANCOCIN) IVPB 1000 mg/200 mL premix  Status:  Discontinued        1,000 mg 200 mL/hr over 60 Minutes Intravenous  Once 12/11/20 1344 12/11/20 1353      Medications: Scheduled Meds: . heparin  5,000 Units Subcutaneous Q8H  . levothyroxine  25 mcg  Intravenous Daily  . liver oil-zinc oxide   Topical BID   Continuous Infusions: . sodium chloride 250 mL (12/15/20 2247)  . piperacillin-tazobactam (ZOSYN)  IV 3.375 g (12/16/20 0610)  . sodium bicarbonate in D5W 1000 mL infusion 125 mL/hr at 12/16/20 0136   PRN Meds:.sodium chloride, acetaminophen    Objective: Weight change: -1 kg  Intake/Output Summary (Last 24 hours) at 12/16/2020 0906 Last data filed at 12/16/2020 0810 Gross per 24 hour  Intake 1135.54 ml  Output 860 ml  Net 275.54 ml   Blood pressure 91/65, pulse 99, temperature 98.3 F (36.8 C), temperature source Axillary, resp. rate 20, height 5\' 9"  (1.753 m), weight 117.8 kg, SpO2 100 %. Temp:  [98.3 F (36.8 C)-99.1 F (37.3 C)] 98.3 F (36.8 C) (01/28 0708) Pulse Rate:  [99-114] 99 (01/28 0708) Resp:  [18-25] 20 (01/28 0708) BP: (90-110)/(59-67) 91/65 (01/28 0708) SpO2:  [97 %-100 %] 100 % (01/28 0708) Weight:  [117.8 kg] 117.8 kg (01/28 0500)  Physical Exam: Physical Exam Constitutional:      General: She is not in acute distress.    Appearance: She is obese. She is ill-appearing.  Eyes:     Conjunctiva/sclera: Conjunctivae normal.  Cardiovascular:     Rate and Rhythm: Regular rhythm. Tachycardia present.     Heart sounds: Murmur heard.    Pulmonary:     Effort: Pulmonary effort is normal.     Breath sounds: Normal breath sounds.  Abdominal:     General: There is distension.     Palpations: Abdomen is soft.     Tenderness: There is no abdominal tenderness.  Musculoskeletal:        General: Normal range of motion.     Comments: Pain w/ movement of the arms and shoulders   Skin:    General: Skin is warm and dry.  Neurological:     Mental Status: She is alert.     Comments: Alert. Oriented to self and place. Disoriented to time. Able to state that she is in the hospital, states it is February and President is [place]. Unable to raise arms and legs.   Psychiatric:     Comments: Delayed thought  process. Difficulty articulating her words.      CBC:  CBC Latest Ref Rng & Units 12/16/2020 12/15/2020 12/14/2020  WBC 4.0 - 10.5 K/uL 6.8 6.3 9.0  Hemoglobin 12.0 - 15.0 g/dL 8.4(L) 8.8(L) 10.1(L)  Hematocrit 36.0 - 46.0 % 26.6(L) 28.2(L) 32.0(L)  Platelets 150 - 400 K/uL 101(L) 99(L) 139(L)    BMET Recent Labs    12/15/20 0100 12/16/20 0604  NA 150* 155*  K 4.3 4.0  CL 121* 122*  CO2 14* 16*  GLUCOSE 135* 97  BUN 131* 109*  CREATININE 3.24* 2.62*  CALCIUM 8.7* 9.0    Liver Panel Recent Labs    12/14/20 0340 12/14/20 2203 12/15/20 0100 12/15/20 1110  PROT 7.3  --   --  6.0*  ALBUMIN 2.4*   < > 2.0* 1.8*  AST 70*  --   --  132*  ALT 145*  --   --  147*  ALKPHOS 151*  --   --  182*  BILITOT 1.1  --   --  0.7  BILIDIR  --   --   --  0.3*  IBILI  --   --   --  0.4   < > = values in this interval not displayed.    Sedimentation Rate Recent Labs    12/15/20 1302  ESRSEDRATE 90*   C-Reactive Protein Recent Labs    12/15/20 1302  CRP 18.1*    Micro Results: Recent Results (from the past 720 hour(s))  Resp panel by RT-PCR (RSV, Flu A&B, Covid) Nasopharyngeal Swab     Status: None   Collection Time: 12/07/20 12:45 PM   Specimen: Nasopharyngeal Swab; Nasopharyngeal(NP) swabs in vial transport medium  Result Value Ref Range Status   SARS Coronavirus 2 by RT PCR NEGATIVE NEGATIVE Final    Comment: (NOTE) SARS-CoV-2 target nucleic acids are NOT DETECTED.  The SARS-CoV-2 RNA is generally detectable in upper respiratory specimens during the acute phase of infection. The lowest concentration of SARS-CoV-2 viral copies this assay can detect is 138 copies/mL. A negative result does not preclude SARS-Cov-2 infection and should not be used as the sole basis for treatment or other patient management decisions. A negative result may occur with  improper specimen collection/handling, submission of specimen other than nasopharyngeal swab, presence of viral mutation(s)  within the areas targeted by this assay, and inadequate number of viral copies(<138 copies/mL). A negative result must be combined with clinical observations, patient history, and epidemiological  information. The expected result is Negative.  Fact Sheet for Patients:  EntrepreneurPulse.com.au  Fact Sheet for Healthcare Providers:  IncredibleEmployment.be  This test is no t yet approved or cleared by the [place] FDA and  has been authorized for detection and/or diagnosis of SARS-CoV-2 by FDA under an Emergency Use Authorization (EUA). This EUA will remain  in effect (meaning this test can be used) for the duration of the COVID-19 declaration under Section 564(b)(1) of the Act, 21 U.S.C.section 360bbb-3(b)(1), unless the authorization is terminated  or revoked sooner.       Influenza A by PCR NEGATIVE NEGATIVE Final   Influenza B by PCR NEGATIVE NEGATIVE Final    Comment: (NOTE) The Xpert Xpress SARS-CoV-2/FLU/RSV plus assay is intended as an aid in the diagnosis of influenza from Nasopharyngeal swab specimens and should not be used as a sole basis for treatment. Nasal washings and aspirates are unacceptable for Xpert Xpress SARS-CoV-2/FLU/RSV testing.  Fact Sheet for Patients: EntrepreneurPulse.com.au  Fact Sheet for Healthcare Providers: IncredibleEmployment.be  This test is not yet approved or cleared by the [place] FDA and has been authorized for detection and/or diagnosis of SARS-CoV-2 by FDA under an Emergency Use Authorization (EUA). This EUA will remain in effect (meaning this test can be used) for the duration of the COVID-19 declaration under Section 564(b)(1) of the Act, 21 U.S.C. section 360bbb-3(b)(1), unless the authorization is terminated or revoked.     Resp Syncytial Virus by PCR NEGATIVE NEGATIVE Final    Comment: (NOTE) Fact Sheet for  Patients: EntrepreneurPulse.com.au  Fact Sheet for Healthcare Providers: IncredibleEmployment.be  This test is not yet approved or cleared by the [place] FDA and has been authorized for detection and/or diagnosis of SARS-CoV-2 by FDA under an Emergency Use Authorization (EUA). This EUA will remain in effect (meaning this test can be used) for the duration of the COVID-19 declaration under Section 564(b)(1) of the Act, 21 U.S.C. section 360bbb-3(b)(1), unless the authorization is terminated or revoked.  Performed at [facility], [place] [address] [place], [place] [address]   Urine culture     Status: Abnormal   Collection Time: 12/07/20  6:47 PM   Specimen: Urine, Random  Result Value Ref Range Status   Specimen Description URINE, RANDOM  Final   Special Requests   Final    NONE Performed at [facility], [place] [address] [place],  [address]    Culture MULTIPLE SPECIES PRESENT, SUGGEST RECOLLECTION (A)  Final   Report Status 12/09/2020 FINAL  Final  Blood Culture (routine x 2)     Status: Abnormal   Collection Time: 12/11/20 11:48 AM   Specimen: BLOOD  Result Value Ref Range Status   Specimen Description BLOOD BLOOD RIGHT HAND  Final   Special Requests   Final    BOTTLES DRAWN AEROBIC AND ANAEROBIC Blood Culture results may not be optimal due to an inadequate volume of blood received in culture bottles   Culture  Setup Time   Final    GRAM POSITIVE COCCI IN CLUSTERS AEROBIC BOTTLE ONLY CRITICAL RESULT CALLED TO, READ BACK BY AND VERIFIED WITH: A. [facility] PharmD 14:35 12/14/20 ([email or web address])    Culture (A)  Final    STAPHYLOCOCCUS CAPITIS THE SIGNIFICANCE OF ISOLATING THIS ORGANISM FROM A SINGLE SET OF BLOOD CULTURES WHEN MULTIPLE SETS ARE DRAWN IS UNCERTAIN. PLEASE NOTIFY THE MICROBIOLOGY DEPARTMENT WITHIN ONE WEEK IF SPECIATION AND SENSITIVITIES ARE REQUIRED. Performed at [facility], [place] [address]  [place],  [place] [address]    Report Status 12/15/2020 FINAL  Final  Blood Culture ID Panel (Reflexed)     Status: Abnormal   Collection Time: 12/11/20 11:48 AM  Result Value Ref Range Status   Enterococcus faecalis NOT DETECTED NOT DETECTED Final   Enterococcus Faecium NOT DETECTED NOT DETECTED Final   Listeria monocytogenes NOT DETECTED NOT DETECTED Final   Staphylococcus species DETECTED (A) NOT DETECTED Final    Comment: CRITICAL RESULT CALLED TO, READ BACK BY AND VERIFIED WITH: A. [facility] PharmD 14:35 12/14/20 ([email or web address])    Staphylococcus aureus (BCID) NOT DETECTED NOT DETECTED Final   Staphylococcus epidermidis NOT DETECTED NOT DETECTED Final   Staphylococcus lugdunensis NOT DETECTED NOT DETECTED Final   Streptococcus species NOT DETECTED NOT DETECTED Final   Streptococcus agalactiae NOT DETECTED NOT DETECTED Final   Streptococcus pneumoniae NOT DETECTED NOT DETECTED Final   Streptococcus pyogenes NOT DETECTED NOT DETECTED Final   A.calcoaceticus-baumannii NOT DETECTED NOT DETECTED Final   Bacteroides fragilis NOT DETECTED NOT DETECTED Final   Enterobacterales NOT DETECTED NOT DETECTED Final   Enterobacter cloacae complex NOT DETECTED NOT DETECTED Final   Escherichia coli NOT DETECTED NOT DETECTED Final   Klebsiella aerogenes NOT DETECTED NOT DETECTED Final   Klebsiella oxytoca NOT DETECTED NOT DETECTED Final   Klebsiella pneumoniae NOT DETECTED NOT DETECTED Final   Proteus species NOT DETECTED NOT DETECTED Final   Salmonella species NOT DETECTED NOT DETECTED Final   Serratia marcescens NOT DETECTED NOT DETECTED Final   Haemophilus influenzae NOT DETECTED NOT DETECTED Final   Neisseria meningitidis NOT DETECTED NOT DETECTED Final   Pseudomonas aeruginosa NOT DETECTED NOT DETECTED Final   Stenotrophomonas maltophilia NOT DETECTED NOT DETECTED Final   Candida albicans NOT DETECTED NOT DETECTED Final   Candida auris NOT DETECTED NOT DETECTED Final   Candida glabrata NOT DETECTED NOT DETECTED Final    Candida krusei NOT DETECTED NOT DETECTED Final   Candida parapsilosis NOT DETECTED NOT DETECTED Final   Candida tropicalis NOT DETECTED NOT DETECTED Final   Cryptococcus neoformans/gattii NOT DETECTED NOT DETECTED Final    Comment: Performed at [facility], [address], [place], [place] [address]  Blood Culture (routine x 2)     Status: None (Preliminary result)   Collection Time: 12/11/20 11:53 AM   Specimen: BLOOD  Result Value Ref Range Status   Specimen Description BLOOD RIGHT ANTECUBITAL  Final   Special Requests   Final    BOTTLES DRAWN AEROBIC AND ANAEROBIC Blood Culture results may not be optimal due to an inadequate volume of blood received in culture bottles   Culture   Final    NO GROWTH 4 DAYS Performed at [facility], [place] [address], [place],  [address]    Report Status PENDING  Incomplete  SARS Coronavirus 2 by RT PCR (hospital order, performed in [facility] hospital lab) Nasopharyngeal Nasopharyngeal Swab     Status: None   Collection Time: 12/11/20 11:56 AM   Specimen: Nasopharyngeal Swab  Result Value Ref Range Status   SARS Coronavirus 2 NEGATIVE NEGATIVE Final    Comment: (NOTE) SARS-CoV-2 target nucleic acids are NOT DETECTED.  The SARS-CoV-2 RNA is generally detectable in upper and lower respiratory specimens during the acute phase of infection. The lowest concentration of SARS-CoV-2 viral copies this assay can detect is 250 copies / mL. A negative result does not preclude SARS-CoV-2 infection and should not be used as the sole basis for treatment or other patient  management decisions.  A negative result may occur with improper specimen collection / handling, submission of specimen other than nasopharyngeal swab, presence of viral mutation(s) within the areas targeted by this assay, and inadequate number of viral copies (<250 copies / mL). A negative result must be combined with clinical observations, patient history, and  epidemiological information.  Fact Sheet for Patients:   StrictlyIdeas.no  Fact Sheet for Healthcare Providers: BankingDealers.co.za  This test is not yet approved or  cleared by the [place] FDA and has been authorized for detection and/or diagnosis of SARS-CoV-2 by FDA under an Emergency Use Authorization (EUA).  This EUA will remain in effect (meaning this test can be used) for the duration of the COVID-19 declaration under Section 564(b)(1) of the Act, 21 U.S.C. section 360bbb-3(b)(1), unless the authorization is terminated or revoked sooner.  Performed at [facility], [place] [address] [place], [place] [address]   Urine culture     Status: Abnormal   Collection Time: 12/11/20 12:19 PM   Specimen: In/Out Cath Urine  Result Value Ref Range Status   Specimen Description IN/OUT CATH URINE  Final   Special Requests   Final    NONE Performed at [facility], [place] [address], [place], [place] [address]    Culture (A)  Final    >=100,000 COLONIES/mL PSEUDOMONAS AERUGINOSA 50,000 COLONIES/mL VANCOMYCIN RESISTANT ENTEROCOCCUS ISOLATED    Report Status 12/14/2020 FINAL  Final   Organism ID, Bacteria PSEUDOMONAS AERUGINOSA (A)  Final   Organism ID, Bacteria VANCOMYCIN RESISTANT ENTEROCOCCUS ISOLATED (A)  Final      Susceptibility   Pseudomonas aeruginosa - MIC*    CEFTAZIDIME 4 SENSITIVE Sensitive     CIPROFLOXACIN <=0.25 SENSITIVE Sensitive     GENTAMICIN <=1 SENSITIVE Sensitive     IMIPENEM 1 SENSITIVE Sensitive     PIP/TAZO 8 SENSITIVE Sensitive     CEFEPIME 2 SENSITIVE Sensitive     * >=100,000 COLONIES/mL PSEUDOMONAS AERUGINOSA   Vancomycin resistant enterococcus isolated - MIC*    AMPICILLIN <=2 SENSITIVE Sensitive     NITROFURANTOIN <=16 SENSITIVE Sensitive     VANCOMYCIN >=32 RESISTANT Resistant     LINEZOLID 2 SENSITIVE Sensitive     * 50,000 COLONIES/mL VANCOMYCIN RESISTANT ENTEROCOCCUS ISOLATED  SARS  CORONAVIRUS 2 (TAT 6-24 HRS) Nasopharyngeal Nasopharyngeal Swab     Status: None   Collection Time: 12/15/20  3:25 AM   Specimen: Nasopharyngeal Swab  Result Value Ref Range Status   SARS Coronavirus 2 NEGATIVE NEGATIVE Final    Comment: (NOTE) SARS-CoV-2 target nucleic acids are NOT DETECTED.  The SARS-CoV-2 RNA is generally detectable in upper and lower respiratory specimens during the acute phase of infection. Negative results do not preclude SARS-CoV-2 infection, do not rule out co-infections with other pathogens, and should not be used as the sole basis for treatment or other patient management decisions. Negative results must be combined with clinical observations, patient history, and epidemiological information. The expected result is Negative.  Fact Sheet for Patients: SugarRoll.be  Fact Sheet for Healthcare Providers: https://www.woods-mathews.com/  This test is not yet approved or cleared by the [place] FDA and  has been authorized for detection and/or diagnosis of SARS-CoV-2 by FDA under an Emergency Use Authorization (EUA). This EUA will remain  in effect (meaning this test can be used) for the duration of the COVID-19 declaration under Se ction 564(b)(1) of the Act, 21 U.S.C. section 360bbb-3(b)(1), unless the authorization is terminated or revoked sooner.  Performed at  [facility], [place] [address], [place], [place] [address]     Studies/Results: CT ABDOMEN PELVIS WO CONTRAST  Result Date: 12/15/2020 CLINICAL DATA:  65 year old female with fever of unknown origin. EXAM: CT CHEST, ABDOMEN AND PELVIS WITHOUT CONTRAST TECHNIQUE: Multidetector CT imaging of the chest, abdomen and pelvis was performed following the standard protocol without IV contrast. COMPARISON:  Chest radiograph dated 12/15/2020 and CT abdomen pelvis dated 12/07/2020 FINDINGS: Evaluation of this exam is limited in the absence of intravenous  contrast. Evaluation is also limited due to streak artifact caused by patient's arms. CT CHEST FINDINGS Cardiovascular: Borderline cardiomegaly. No pericardial effusion. Mild atherosclerotic calcification of the aortic arch. No aneurysmal dilatation. There is mild dilatation of the main pulmonary trunk suggestive of a degree of pulmonary hypertension. Clinical correlation is recommended. Mediastinum/Nodes: No hilar or mediastinal adenopathy. The esophagus is grossly unremarkable. No mediastinal fluid collection. Lungs/Pleura: Bilateral lower lobe and bibasilar streaky densities may represent atelectasis/scarring. Atypical infiltrate is less likely but not excluded clinical correlation is recommended. There is no pleural effusion pneumothorax. The central airways are patent. Musculoskeletal: Mild osteopenia and mild degenerative changes of the spine. No acute osseous pathology. CT ABDOMEN PELVIS FINDINGS No intra-abdominal free air or free fluid. Hepatobiliary: The liver is grossly unremarkable. No intrahepatic biliary ductal dilatation. Cholecystectomy. Pancreas: Unremarkable. No pancreatic ductal dilatation or surrounding inflammatory changes. Spleen: Normal in size without focal abnormality. Adrenals/Urinary Tract: The adrenal glands unremarkable. There is no hydronephrosis or nephrolithiasis on either side. There is irregularity of the right renal contour. Subcentimeter exophytic high attenuating lesion from the inferior pole of the right kidney is not characterized on this CT. Ultrasound may provide better evaluation. The visualized ureters and urinary bladder appear unremarkable. Stomach/Bowel: There is gaseous distension of the colon. Liquid stool throughout the colon compatible with diarrhea state. There is thickened appearance of the rectosigmoid which may be related to underdistention or represent colitis. Clinical correlation is recommended. No bowel obstruction. The appendix is not visualized with  certainty. No inflammatory changes identified in the right lower quadrant. Vascular/Lymphatic: Mild aortoiliac atherosclerotic disease. The IVC is unremarkable. No portal venous gas. There is no adenopathy. Reproductive: The uterus is anteverted and grossly unremarkable. There is a 5.5 cm left adnexal cyst. Because this lesion is not adequately characterized, prompt [place] is recommended for further evaluation. Note: This recommendation does not apply to premenarchal patients and to those with increased risk (genetic, family history, elevated tumor markers or other high-risk factors) of ovarian cancer. Reference: JACR 2020 Feb; 17(2):248-254 Other: Midline vertical anterior pelvic wall incisional scar. Stranding with small pockets of air in the subcutaneous soft tissues of the left anterior pelvic wall. No drainable fluid collection. Musculoskeletal: Osteopenia with degenerative changes of the spine. No acute osseous pathology. IMPRESSION: 1. Bilateral lower lobe and bibasilar streaky densities may represent atelectasis/scarring. Atypical infiltrate is less likely but not excluded. 2. Liquid stool throughout the colon compatible with diarrhea state. Thickened appearance of the rectosigmoid may be related to underdistention or represent colitis. No bowel obstruction. 3. No hydronephrosis or nephrolithiasis. 4. A 5.5 cm left adnexal cyst. 5. Aortic Atherosclerosis (ICD10-I70.0). Electronically Signed   By: Anner Crete M.D.   On: 12/15/2020 16:28   CT CHEST WO CONTRAST  Result Date: 12/15/2020 CLINICAL DATA:  65 year old female with fever of unknown origin. EXAM: CT CHEST, ABDOMEN AND PELVIS WITHOUT CONTRAST TECHNIQUE: Multidetector CT imaging of the chest, abdomen and pelvis was performed following the standard protocol without IV contrast. COMPARISON:  Chest radiograph dated 12/15/2020 and CT abdomen pelvis dated 12/07/2020 FINDINGS: Evaluation of this exam is limited in the absence of intravenous contrast.  Evaluation is also limited due to streak artifact caused by patient's arms. CT CHEST FINDINGS Cardiovascular: Borderline cardiomegaly. No pericardial effusion. Mild atherosclerotic calcification of the aortic arch. No aneurysmal dilatation. There is mild dilatation of the main pulmonary trunk suggestive of a degree of pulmonary hypertension. Clinical correlation is recommended. Mediastinum/Nodes: No hilar or mediastinal adenopathy. The esophagus is grossly unremarkable. No mediastinal fluid collection. Lungs/Pleura: Bilateral lower lobe and bibasilar streaky densities may represent atelectasis/scarring. Atypical infiltrate is less likely but not excluded clinical correlation is recommended. There is no pleural effusion pneumothorax. The central airways are patent. Musculoskeletal: Mild osteopenia and mild degenerative changes of the spine. No acute osseous pathology. CT ABDOMEN PELVIS FINDINGS No intra-abdominal free air or free fluid. Hepatobiliary: The liver is grossly unremarkable. No intrahepatic biliary ductal dilatation. Cholecystectomy. Pancreas: Unremarkable. No pancreatic ductal dilatation or surrounding inflammatory changes. Spleen: Normal in size without focal abnormality. Adrenals/Urinary Tract: The adrenal glands unremarkable. There is no hydronephrosis or nephrolithiasis on either side. There is irregularity of the right renal contour. Subcentimeter exophytic high attenuating lesion from the inferior pole of the right kidney is not characterized on this CT. Ultrasound may provide better evaluation. The visualized ureters and urinary bladder appear unremarkable. Stomach/Bowel: There is gaseous distension of the colon. Liquid stool throughout the colon compatible with diarrhea state. There is thickened appearance of the rectosigmoid which may be related to underdistention or represent colitis. Clinical correlation is recommended. No bowel obstruction. The appendix is not visualized with certainty. No  inflammatory changes identified in the right lower quadrant. Vascular/Lymphatic: Mild aortoiliac atherosclerotic disease. The IVC is unremarkable. No portal venous gas. There is no adenopathy. Reproductive: The uterus is anteverted and grossly unremarkable. There is a 5.5 cm left adnexal cyst. Because this lesion is not adequately characterized, prompt [place] is recommended for further evaluation. Note: This recommendation does not apply to premenarchal patients and to those with increased risk (genetic, family history, elevated tumor markers or other high-risk factors) of ovarian cancer. Reference: JACR 2020 Feb; 17(2):248-254 Other: Midline vertical anterior pelvic wall incisional scar. Stranding with small pockets of air in the subcutaneous soft tissues of the left anterior pelvic wall. No drainable fluid collection. Musculoskeletal: Osteopenia with degenerative changes of the spine. No acute osseous pathology. IMPRESSION: 1. Bilateral lower lobe and bibasilar streaky densities may represent atelectasis/scarring. Atypical infiltrate is less likely but not excluded. 2. Liquid stool throughout the colon compatible with diarrhea state. Thickened appearance of the rectosigmoid may be related to underdistention or represent colitis. No bowel obstruction. 3. No hydronephrosis or nephrolithiasis. 4. A 5.5 cm left adnexal cyst. 5. Aortic Atherosclerosis (ICD10-I70.0). Electronically Signed   By: Anner Crete M.D.   On: 12/15/2020 16:28   DG CHEST PORT 1 VIEW  Result Date: 12/15/2020 CLINICAL DATA:  Sepsis EXAM: PORTABLE CHEST 1 VIEW COMPARISON:  12/11/2020 FINDINGS: Left basilar scarring unchanged. Pulmonary insufflation is stable. No superimposed focal pulmonary infiltrate. No pneumothorax or pleural effusion. Cardiac size is at the upper limits of normal. IMPRESSION: No active disease. Electronically Signed   By: Fidela Salisbury MD   On: 12/15/2020 06:35   ECHOCARDIOGRAM COMPLETE  Result Date: 12/15/2020     ECHOCARDIOGRAM REPORT   Patient Name:   MADICYN SECREASE Date of Exam: 12/15/2020 Medical Rec #:  [id number]       Height:  69.0 in Accession #:    [id number]      Weight:       258.2 lb Date of Birth:  1956-05-13       BSA:          2.303 m Patient Age:    65 years        BP:           95/65 mmHg Patient Gender: F               HR:           106 bpm. Exam Location:  Inpatient Procedure: 2D Echo, Cardiac Doppler, Color Doppler and Intracardiac            Opacification Agent Indications:    Fever  History:        Patient has no prior history of Echocardiogram examinations.                 Risk Factors:Hypertension and Diabetes. CKD.  Sonographer:    Clayton Lefort RDCS (AE) Referring Phys: Francis Dowse Gwyndolyn Saxon A HENSEL  Sonographer Comments: Technically challenging study due to limited acoustic windows, Technically difficult study due to poor echo windows and suboptimal apical window. Image acquisition challenging due to patient body habitus. IMPRESSIONS  1. Left ventricular ejection fraction, by estimation, is 40 to 45%. The left ventricle has mildly decreased function. The left ventricle demonstrates global hypokinesis. There is mild left ventricular hypertrophy. Left ventricular diastolic parameters are indeterminate.  2. Right ventricular systolic function is normal. The right ventricular size is normal. There is normal pulmonary artery systolic pressure. The estimated right ventricular systolic pressure is 18.8 mmHg.  3. The mitral valve is normal in structure. Trivial mitral valve regurgitation. No evidence of mitral stenosis.  4. The aortic valve is tricuspid. Aortic valve regurgitation is mild. Mild aortic valve sclerosis is present, with no evidence of aortic valve stenosis.  5. The inferior vena cava is normal in size with greater than 50% respiratory variability, suggesting right atrial pressure of 3 mmHg.  6. Technically difficult study with poor acoustic windows. FINDINGS  Left Ventricle: Left ventricular ejection  fraction, by estimation, is 40 to 45%. The left ventricle has mildly decreased function. The left ventricle demonstrates global hypokinesis. Definity contrast agent was given IV to delineate the left ventricular  endocardial borders. The left ventricular internal cavity size was normal in size. There is mild left ventricular hypertrophy. Left ventricular diastolic parameters are indeterminate. Right Ventricle: The right ventricular size is normal. No increase in right ventricular wall thickness. Right ventricular systolic function is normal. There is normal pulmonary artery systolic pressure. The tricuspid regurgitant velocity is 2.71 m/s, and  with an assumed right atrial pressure of 3 mmHg, the estimated right ventricular systolic pressure is 41.6 mmHg. Left Atrium: Left atrial size was normal in size. Right Atrium: Right atrial size was normal in size. Pericardium: There is no evidence of pericardial effusion. Mitral Valve: The mitral valve is normal in structure. Trivial mitral valve regurgitation. No evidence of mitral valve stenosis. Tricuspid Valve: The tricuspid valve is normal in structure. Tricuspid valve regurgitation is mild. Aortic Valve: The aortic valve is tricuspid. Aortic valve regurgitation is mild. Mild aortic valve sclerosis is present, with no evidence of aortic valve stenosis. Aortic valve mean gradient measures 6.0 mmHg. Aortic valve peak gradient measures 11.8 mmHg. Aortic valve area, by VTI measures 1.95 cm. Pulmonic Valve: The pulmonic valve was normal in structure. Pulmonic valve regurgitation is trivial. Aorta:  The aortic root is normal in size and structure. Venous: The inferior vena cava is normal in size with greater than 50% respiratory variability, suggesting right atrial pressure of 3 mmHg. IAS/Shunts: No atrial level shunt detected by color flow Doppler.  LEFT VENTRICLE PLAX 2D LVIDd:         4.50 cm LVIDs:         3.30 cm LV PW:         2.40 cm LV IVS:        1.10 cm LVOT diam:      2.10 cm LV SV:         52 LV SV Index:   23 LVOT Area:     3.46 cm  RIGHT VENTRICLE             IVC RV Basal diam:  3.10 cm     IVC diam: 0.90 cm RV S prime:     14.10 cm/s TAPSE (M-mode): 2.5 cm LEFT ATRIUM             Index       RIGHT ATRIUM          Index LA diam:        2.50 cm 1.09 cm/m  RA Area:     9.85 cm LA Vol (A2C):   50.7 ml 22.02 ml/m RA Volume:   19.30 ml 8.38 ml/m LA Vol (A4C):   49.4 ml 21.45 ml/m LA Biplane Vol: 49.9 ml 21.67 ml/m  AORTIC VALVE AV Area (Vmax):    2.17 cm AV Area (Vmean):   2.12 cm AV Area (VTI):     1.95 cm AV Vmax:           172.00 cm/s AV Vmean:          111.000 cm/s AV VTI:            0.266 m AV Peak Grad:      11.8 mmHg AV Mean Grad:      6.0 mmHg LVOT Vmax:         108.00 cm/s LVOT Vmean:        67.900 cm/s LVOT VTI:          0.150 m LVOT/AV VTI ratio: 0.56  AORTA Ao Root diam: 2.90 cm Ao Asc diam:  3.00 cm TRICUSPID VALVE TR Peak grad:   29.4 mmHg TR Vmax:        271.00 cm/s  SHUNTS Systemic VTI:  0.15 m Systemic Diam: 2.10 cm Loralie Champagne MD Electronically signed by Loralie Champagne MD Signature Date/Time: 12/15/2020/11:49:31 PM    Final     Assessment/Plan:  INTERVAL HISTORY:   CT abd/pel: No SBO, 5.5 cm left adnexal cyst and possible colitis.  CT chest: No PE or pneumothorax. Mild pulm HTN.  ECHO: New EF of 40-45%, mild LVH. No endocarditis.   Active Problems:   AKI (acute kidney injury) ([place])   Sepsis ([place])   Acute renal failure ([place])   Acute renal failure (ARF) ([facility])   Urinary tract infection with hematuria   Jenavie Merritts is a 65 y.o. female w/ PMH of T2DM, CKD IIIb, CVA, hypothyroidism, HTN, bedbound s/p CVA, HLD, depression, and obesity presented from [facility] with altered mental states and found to be septic 2/2 to UTI and possible uremic encephalopathy.   Patient afebrile overnight. White count remains normal. BUN trending down. Repeat blood cultures with no growth in 1 day. No significant findings on CT chest and CT abd/pel. Patient  with  new HFrEF with EF of 40-45%. Patient's encephalopathy is improving.   Will continue zosyn for 3 more days to treat her UTI.   Code status has been changed to DNR. Continue conversation to palliative about goals of care.   Please with call with any questions.    LOS: 4 days   Lacinda Axon 12/16/2020, 9:06 AM

## 2020-12-16 NOTE — Progress Notes (Addendum)
Nutrition Follow-up  DOCUMENTATION CODES:   Obesity unspecified  INTERVENTION:   -RD will follow for diet advancement and adjust supplement regimen as appropriate -If pt remains NPO and is within pt's goals of care, recommend:  Initiate Osmolite 1.5 @ 25 ml/hr via cortrak tube and increase by 10 ml every 8 hours to goal rate of 55 ml/hr.   45 ml Prosource TF TID   Tube feeding regimen provides 2100 kcal (100% of needs), 116 grams of protein, and 1006 ml of H2O.   -If feedings are initiated, monitor Mg, K, and Phos daily and replete as necessary due to high refeeding risk  NUTRITION DIAGNOSIS:   Inadequate oral intake related to inability to eat as evidenced by NPO status.  Ongoing  GOAL:   Patient will meet greater than or equal to 90% of their needs  Progressing   MONITOR:   Diet advancement,Labs,Weight trends,Skin,I & O's  REASON FOR ASSESSMENT:   Low [place]    ASSESSMENT:   Lauren Barber is a 65 y.o. female presenting from [facility] with altered mental status. PMH is significant for CKD, T2DM, depression, GERD, hyperlipidemia, hypothyroidism, history of CVA, sacral ulcers.  1/26- s/p BSE- recommend NPO 1/28- cortrak tube placed (tip of tube in stomach)  Reviewed I/O's: +2.3 L x 24 hours and +7.8 L since admission  UOP: 1.2 L x 24 hours  Case discussed with MD. She reports that she would like to hold off on initiating TF at this time. Per her report, pt is more alert today and is awaiting SLP eval to see if she can advance to a PO diet. Palliative care team also following; family meeting scheduled tomorrow morning to further define goals of care.   Labs reviewed: Na: 155, CBGS: 93-97.   Diet Order:   Diet Order            Diet NPO time specified  Diet effective now                 EDUCATION NEEDS:   Not appropriate for education at this time  Skin:  Skin Assessment: Skin Integrity Issues: Skin Integrity Issues:: Other (Comment) Other:  MASD to buttocks  Last BM:  12/15/20  Height:   Ht Readings from Last 1 Encounters:  12/12/20 5\' 9"  (1.753 m)    Weight:   Wt Readings from Last 1 Encounters:  12/16/20 117.8 kg    Ideal Body Weight:  65.9 kg  BMI:  Body mass index is 38.35 kg/m.  Estimated Nutritional Needs:   Kcal:  2000-2200  Protein:  115-130 grams  Fluid:  > 2 L    Loistine Chance, RD, LDN, [place] Registered Dietitian II Certified Diabetes Care and Education Specialist Please refer to [facility] for RD and/or RD on-call/weekend/after hours pager

## 2020-12-16 NOTE — Progress Notes (Signed)
Pharmacy Antibiotic Note  Lauren Barber is a 64 y.o. female admitted on 12/11/2020 with AMS, found to have a UTI.  Pharmacy has been consulted for Zosyn dosing.  Day 4 of Zosyn. Urine culture susceptibilities showed Pseudomonas sensitive to Zosyn and VRE sensitive to ampicillin. Continued treatment for UTI is appropriate, as patient expressed painful urination today. Dose remains appropriate for patient's renal function.  Plan:  Continue Zosyn 3.375 g IV q8h. Follow up with team on an appropriate stop date.  Height: 5\' 9"  (175.3 cm) Weight: 117.8 kg (259 lb 11.2 oz) IBW/kg (Calculated) : 66.2  Temp (24hrs), Avg:98.9 F (37.2 C), Min:98.3 F (36.8 C), Max:99.6 F (37.6 C)  Recent Labs  Lab 12/11/20 1313 12/11/20 2303 12/11/20 2304 12/12/20 0528 12/12/20 2947 12/13/20 0427 12/14/20 0340 12/14/20 1004 12/14/20 2203 12/15/20 0100 12/15/20 0643 12/15/20 1110 12/16/20 0604  WBC 7.7  --   --  6.6  --   --   --  9.0  --   --   --  6.3 6.8  CREATININE 5.54*  --    < >  --    < > 2.85* 3.11*  --  3.16* 3.24*  --   --  2.62*  LATICACIDVEN 1.4 1.1  --   --   --   --   --   --   --  3.8* 3.9*  --   --    < > = values in this interval not displayed.    Estimated Creatinine Clearance: 30.5 mL/min (A) (by C-G formula based on SCr of 2.62 mg/dL (H)).    Allergies  Allergen Reactions  . Nsaids Other (See Comments)    CKD Cr 2  . Shellfish Allergy Other (See Comments)    Per MAR    Antimicrobials this admission: Vancomycin 1/23 x 1 Metronidazole 1/23 x 1 Cefepime 1/23 >> 1/24 Zosyn 1/25 >>  Microbiology results: 1/23 BCx: no growth to date 1/23 UCx: >100,000 colonies/mL Pseudomonas and 50,000 colonies/mL Enterococcus faecalis  Thank you for allowing pharmacy to be a part of this patient's care.  Esmeralda Links, PharmD Candidate 12/16/2020 12:39 PM

## 2020-12-16 NOTE — Progress Notes (Signed)
  Speech Language Pathology Treatment: Dysphagia  Patient Details Name: Lauren Barber MRN: [id number] DOB: 1956/08/20 Today's Date: 12/16/2020 Time: 6010-9323 SLP Time Calculation (min) (ACUTE ONLY): 15 min  Assessment / Plan / Recommendation Clinical Impression  Pt is more alert today but still with altered mentation that seems to impact ability to consume POs. Oral holding is noted with ice chips and purees. She did not swallow the ice chip at all as it melted, and ultimately had to spit out the applesauce. Before doing so, she told me that it was "too thick" and that she couldn't swallow it. Pt needed Max cues for awareness and acceptance of thin liquids, only intermittently able to suck from a straw, and with coughing that often followed. She is not ready for a PO diet. Would consider use of cortrak placed today to provide temporary, alternative means of nutrition with SLP to provide ongoing follow up.    HPI HPI: Pt is a 65 y/o female admitted from SNF secondary to ARF, AMS, and UTI. MRI without acute findings. CXR without evidence of PNA. PMH includes DM, CVA, HTN, gout, and sacral wounds.      SLP Plan  Continue with current plan of care       Recommendations  Diet recommendations: NPO Medication Administration: Via alternative means                Oral Care Recommendations: Oral care QID Follow up Recommendations: Skilled Nursing facility SLP Visit Diagnosis: Dysphagia, unspecified (R13.10) Plan: Continue with current plan of care       GO                Osie Bond., M.A. [facility] Pager [phone] Office [phone]  12/16/2020, 2:46 PM

## 2020-12-16 NOTE — Progress Notes (Addendum)
Family Medicine Teaching Service Daily Progress Note Intern Pager: [phone]  Patient name: Lauren Barber Medical record number: [id number] Date of birth: [birth year] Age: 65 y.o. Gender: female  Primary Care Provider: Salome Arnt, [place] Consultants: Nephrology, ID, palliative, CCM (s/o) Code Status: DNR  Pt Overview and Major Events to Date:  1/23: admitted, nephro consulted 1/25: started Zosyn 1/27: ID, CCM and palliative consulted  Assessment and Plan:  Lauren Harrisonis a 65 y.o.[place]em[place] [place].PMH is significant for CKD, T2DM, depression, GERD, hyperlipidemia, hypothyroidism, history of CVA, sacral ulcers.  Severe Sepsis  Urinary Tract Infection Improving- patient no longer meeting severe sepsis criteria. Presumed to be secondary to pseudomonas and VRE UTI. Still intermittently tachycardic into the low 100s but BP slightly improved, afebrile, WBC remains wnl. Mental status improving, but still not at baseline. She was able to report burning with urination to me this morning. Additional labs obtained yesterday revealed CRP 18.1, sed rate 90, procal 8.25. CT chest, abdomen, pelvis unremarkable. Echo showed EF 40-45% but no concerns for endocarditis. -ID following, appreciate recommendations -Palliative consulted, appreciate their expertise with [place] discussion. Patient now DNR -Continue Zosyn (1/25- ) -f/u repeat culture results from 1/27 -Monitor vitals closely -Continue IV fluids, spoke with nephro and we agreed upon 67mEq sodium bicarb in D5 @ 125cc/hr (will also give 1 amp push of bicarb)  Acute Renal Failure on CKD  AG Metabolic Acidosis Improving, Creatinine 2.62 this morning, BUN 109, est GFR 20. Creatinine 5.5 on admission (baseline ~2). Patient had 1.1L UOP yesterday.  -Nephro following, appreciate recommendations -Per nephro, patient is poor dialysis candidate -16mEq sodium bicarb in D5 @ 125cc/hr -1 amp push of  bicarb -Monitor BMP  Altered Mental Status Improving this morning. On exam she is alert and interactive, but her speech is very difficult to understand. Etiology of her altered mental status thought to be secondary to uremic encephalopathy and sepsis. -Continue to monitor closely -Treatment of sepsis and renal failure as above -Repeat swallow eval by SLP given improved mentation -Family meeting with palliative 1/29 at 11am  Hypernatremia Na slightly worse this morning (155 from 150 yesterday). -Will change IV fluids to 20mEqm sodium bicarb in D5 @ 125 cc/hr (previously was getting 100 mEq sodium bicarb) -Repeat BMP this afternoon  Hypotension  Hx of Hypertension BPs have been low-normal overnight and this morning. Most recent BP 91/65. Takes torsemide 10mg  daily and spironolactone 25mg  daily at home for hypertension. -Holding home meds in the setting of hypotension  T2DM Very well controlled at baseline- A1c 6.2%. Glucose 93 this morning. Patient has remained NPO since admission due to altered mental status. Home meds: Januvia and Trulicity -Holding home meds -CBG monitoring -Repeat SLP eval   Severe Protein Calorie Malnutrition Albumin 2.0.  -Consider nutrition consult when patient is more stable -Remains NPO for now due to altered mental status  History of CVA  Hyperlipidemia Patient bedbound at baseline 2/2 deficits from prior stroke. On Atorvastatin 20mg  daily and ASA 81 mg daily at home. Also on tizanidine 2mg  QID for spasticity -Holding home meds due to NPO status and altered mental status  Hypothyroidism Chronic, stable. Home medications include Synthroid 50 mcgdaily. [place] admission (2.832). -Continue IV Synthroid 25 mcg daily  Depression Home meds include fluoxetine 50mg  daily and desvenlafaxine 100mg  daily -Holding home meds due to NPO status  Gout Chronic, stable.Home medications include colchicine 0.6 mgBID andallopurinol 200 mg/day. -Holding home  medications for now  GERD Chronic, stable.Home medications include Pepcid 20 mg/day -Holding  home medications due to altered mental status  Sacral Skin Changes Patient was evaluated by wound care- her sacral skin changes deemed to be scar tissue, not active wounds. -Desitin prn   FEN/GI: NPO (failed swallow eval 1/26) PPx: Heparin   Status is: Inpatient Remains inpatient appropriate because:Ongoing diagnostic testing needed not appropriate for outpatient work up and Inpatient level of care appropriate due to severity of illness   Dispo:  Patient From: Lauren Barber  Planned Disposition: [place]  Expected discharge date: 12/19/2020  Medically stable for discharge: No      Subjective:  Patient more alert this morning, able to follow commands and answer questions. However, her speech is very difficult to understand, which limits the history. She does report diffuse pain in her extremities, and burning with urination. She is very thirsty.  Objective: Temp:  [98.3 F (36.8 C)-99.1 F (37.3 C)] 98.3 F (36.8 C) (01/28 0708) Pulse Rate:  [99-114] 99 (01/28 0708) Resp:  [18-25] 20 (01/28 0708) BP: (90-110)/(59-67) 91/65 (01/28 0708) SpO2:  [97 %-100 %] 100 % (01/28 0708) Weight:  [117.8 kg] 117.8 kg (01/28 0500) Physical Exam: General: alert, obese female resting comfortably in bed HEENT: PERRL Cardiovascular: tachycardic, regular rhythm, normal S1/S2 Respiratory: breathing comfortably on room air, lungs CTA although limited by body habitus/bedbound status Abdomen: obese, soft, nondistended, nontender Extremities: 1+ nonpitting pedal edema bilaterally, unchanged from prior Neuro: alert, oriented to person (not time or place currently). Difficult to understand speech. Able to move R hand (3/5 strength), otherwise does not move any extremities (0/5 strength). Grimaces with passive movement of bilateral upper extremities.   Laboratory: Recent Labs   Lab 12/14/20 1004 12/15/20 1110 12/16/20 0604  WBC 9.0 6.3 6.8  HGB 10.1* 8.8* 8.4*  HCT 32.0* 28.2* 26.6*  PLT 139* 99* 101*   Recent Labs  Lab 12/13/20 0427 12/14/20 0340 12/14/20 2203 12/15/20 0100 12/15/20 1110 12/16/20 0604  NA 145 150* 150* 150*  --  155*  K 3.8 4.4 5.3* 4.3  --  4.0  CL 107 119* 123* 121*  --  122*  CO2 24 16* 11* 14*  --  16*  BUN 119* 133* 134* 131*  --  109*  CREATININE 2.85* 3.11* 3.16* 3.24*  --  2.62*  CALCIUM 8.1* 9.6 8.8* 8.7*  --  9.0  PROT 6.1* 7.3  --   --  6.0*  --   BILITOT 0.7 1.1  --   --  0.7  --   ALKPHOS 116 151*  --   --  182*  --   ALT 153* 145*  --   --  147*  --   AST 82* 70*  --   --  132*  --   GLUCOSE 84 101* 126* 135*  --  97    Imaging/Diagnostic Tests: CT ABDOMEN PELVIS WO CONTRAST CT CHEST WO CONTRAST Result Date: 12/15/2020 IMPRESSION: 1. Bilateral lower lobe and bibasilar streaky densities may represent atelectasis/scarring. Atypical infiltrate is less likely but not excluded. 2. Liquid stool throughout the colon compatible with diarrhea state. Thickened appearance of the rectosigmoid may be related to underdistention or represent colitis. No bowel obstruction. 3. No hydronephrosis or nephrolithiasis. 4. A 5.5 cm left adnexal cyst. 5. Aortic Atherosclerosis (ICD10-I70.0).   ECHOCARDIOGRAM COMPLETE Result Date: 12/15/2020 IMPRESSIONS  1. Left ventricular ejection fraction, by estimation, is 40 to 45%. The left ventricle has mildly decreased function. The left ventricle demonstrates global hypokinesis. There is mild left ventricular hypertrophy. Left ventricular  diastolic parameters are indeterminate.  2. Right ventricular systolic function is normal. The right ventricular size is normal. There is normal pulmonary artery systolic pressure. The estimated right ventricular systolic pressure is 27.0 mmHg.  3. The mitral valve is normal in structure. Trivial mitral valve regurgitation. No evidence of mitral stenosis.  4. The  aortic valve is tricuspid. Aortic valve regurgitation is mild. Mild aortic valve sclerosis is present, with no evidence of aortic valve stenosis.  5. The inferior vena cava is normal in size with greater than 50% respiratory variability, suggesting right atrial pressure of 3 mmHg.  6. Technically difficult study with poor acoustic windows.   Alcus Dad, MD 12/16/2020, 8:50 AM PGY-1, [place] Intern pager: [phone], text pages welcome

## 2020-12-16 NOTE — Procedures (Signed)
Cortrak  Person Inserting Tube:  Coltyn Hanning E, RD Tube Type:  Cortrak - 43 inches Tube Location:  Left nare Initial Placement:  Stomach Secured by: Bridle Technique Used to Measure Tube Placement:  Documented cm marking at nare/ corner of mouth Cortrak Secured At:  70 cm   Cortrak Tube Team Note:  Consult received to place a Cortrak feeding tube.   No x-ray is required. RN may begin using tube.   If the tube becomes dislodged please keep the tube and contact the Cortrak team at [email or web address] ([email or web address]) for replacement.  If after hours and replacement cannot be delayed, place a NG tube and confirm placement with an abdominal x-ray.    Suraj Ramdass, MS, RD, LDN RD pager number and weekend/on-call pager number located in Amion.    

## 2020-12-16 NOTE — Progress Notes (Addendum)
Late entry  Visited patient in her room, her mental status is about the same, her eyes were open and would look at me, and anytime I asked her question she would grunt or say "uh". Vitals signs are stable and appear much better than they were the night before.   Asked RN Loreli Slot to call should he need anything.  Milus Banister, [place], PGY-3 12/16/2020 7:03 AM

## 2020-12-16 NOTE — Progress Notes (Signed)
This chaplain responded to PMT consult for spiritual care.  The Pt. is without family at the bedside at the time of the visit.    The chaplain understands from the Pt. RN-Mary Belenda Cruise, the Pt. talked to family today and is more alert than yesterday.  There is a family meeting scheduled at [place] Saturday.   The chaplain was present at the Pt. bedside, listening attentively and acknowledging the effort the Pt. was giving to communication.   The words "chaplain" and "brother" were heard several times, along with "please help me up."  This chaplain will ask the weekend chaplains to F/U with spiritual care visits.

## 2020-12-16 NOTE — Progress Notes (Signed)
[facility] Progress Note   Subjective:   Improved with fluids yesterday. Palliative care seeing for [place].   Ins yesterday 3.4L/UOP 1125mL   Objective Vitals:   12/16/20 0708 12/16/20 1011 12/16/20 1013 12/16/20 1128  BP: 91/65 95/69 95/69  95/62  Pulse: 99 (!) 105 (!) 105 (!) 112  Resp: 20 (!) 21 (!) 21 20  Temp: 98.3 F (36.8 C) 99.6 F (37.6 C) 99.6 F (37.6 C) 99.4 F (37.4 C)  TempSrc: Axillary Axillary  Axillary  SpO2: 100% 99%  99%  Weight:      Height:       Physical Exam General: lying flat in bed drowsy, mumbling Heart: tachy to low 100s on exam, no rub Lungs: normal wob on [place], clear ant  Abdomen: soft, obese Extremities: no edema Neuro:  Drowsy, mumbles  Additional Objective Labs: Basic Metabolic Panel: Recent Labs  Lab 12/14/20 2203 12/15/20 0100 12/16/20 0604  NA 150* 150* 155*  K 5.3* 4.3 4.0  CL 123* 121* 122*  CO2 11* 14* 16*  GLUCOSE 126* 135* 97  BUN 134* 131* 109*  CREATININE 3.16* 3.24* 2.62*  CALCIUM 8.8* 8.7* 9.0  PHOS 3.7 3.7 3.8   Liver Function Tests: Recent Labs  Lab 12/13/20 0427 12/14/20 0340 12/14/20 2203 12/15/20 0100 12/15/20 1110  AST 82* 70*  --   --  132*  ALT 153* 145*  --   --  147*  ALKPHOS 116 151*  --   --  182*  BILITOT 0.7 1.1  --   --  0.7  PROT 6.1* 7.3  --   --  6.0*  ALBUMIN 2.2* 2.4* 2.0* 2.0* 1.8*   No results for input(s): LIPASE, AMYLASE in the last 168 hours. CBC: Recent Labs  Lab 12/11/20 1313 12/12/20 0528 12/14/20 1004 12/15/20 1110 12/16/20 0604  WBC 7.7 6.6 9.0 6.3 6.8  NEUTROABS 4.9  --   --   --   --   HGB 12.1 10.6* 10.1* 8.8* 8.4*  HCT 39.2 35.6* 32.0* 28.2* 26.6*  MCV 96.8 99.4 97.3 98.6 99.3  PLT 233 175 139* 99* 101*   Blood Culture    Component Value Date/Time   SDES BLOOD SITE NOT SPECIFIED 12/15/2020 0131   SPECREQUEST  12/15/2020 0131    BOTTLES DRAWN AEROBIC AND ANAEROBIC Blood Culture results may not be optimal due to an excessive volume of blood  received in culture bottles   CULT  12/15/2020 0131    NO GROWTH 1 DAY Performed at [facility], [place] [address] [place], [place] [address]    REPTSTATUS PENDING 12/15/2020 0131    Cardiac Enzymes: Recent Labs  Lab 12/11/20 1313 12/12/20 0632 12/16/20 1002  CKTOTAL 2,474* 1,997* 741*   CBG: Recent Labs  Lab 12/15/20 1213 12/15/20 1714 12/16/20 0201 12/16/20 0616 12/16/20 1125  GLUCAP 111* 104* 97 93 95   Iron Studies: No results for input(s): IRON, TIBC, TRANSFERRIN, FERRITIN in the last 72 hours. @lablastinr3 @ Studies/Results: CT ABDOMEN PELVIS WO CONTRAST  Result Date: 12/15/2020 CLINICAL DATA:  65 year old female with fever of unknown origin. EXAM: CT CHEST, ABDOMEN AND PELVIS WITHOUT CONTRAST TECHNIQUE: Multidetector CT imaging of the chest, abdomen and pelvis was performed following the standard protocol without IV contrast. COMPARISON:  Chest radiograph dated 12/15/2020 and CT abdomen pelvis dated 12/07/2020 FINDINGS: Evaluation of this exam is limited in the absence of intravenous contrast. Evaluation is also limited due to streak artifact caused by patient's arms. CT CHEST FINDINGS Cardiovascular: Borderline cardiomegaly.  No pericardial effusion. Mild atherosclerotic calcification of the aortic arch. No aneurysmal dilatation. There is mild dilatation of the main pulmonary trunk suggestive of a degree of pulmonary hypertension. Clinical correlation is recommended. Mediastinum/Nodes: No hilar or mediastinal adenopathy. The esophagus is grossly unremarkable. No mediastinal fluid collection. Lungs/Pleura: Bilateral lower lobe and bibasilar streaky densities may represent atelectasis/scarring. Atypical infiltrate is less likely but not excluded clinical correlation is recommended. There is no pleural effusion pneumothorax. The central airways are patent. Musculoskeletal: Mild osteopenia and mild degenerative changes of the spine. No acute osseous pathology. CT ABDOMEN PELVIS  FINDINGS No intra-abdominal free air or free fluid. Hepatobiliary: The liver is grossly unremarkable. No intrahepatic biliary ductal dilatation. Cholecystectomy. Pancreas: Unremarkable. No pancreatic ductal dilatation or surrounding inflammatory changes. Spleen: Normal in size without focal abnormality. Adrenals/Urinary Tract: The adrenal glands unremarkable. There is no hydronephrosis or nephrolithiasis on either side. There is irregularity of the right renal contour. Subcentimeter exophytic high attenuating lesion from the inferior pole of the right kidney is not characterized on this CT. Ultrasound may provide better evaluation. The visualized ureters and urinary bladder appear unremarkable. Stomach/Bowel: There is gaseous distension of the colon. Liquid stool throughout the colon compatible with diarrhea state. There is thickened appearance of the rectosigmoid which may be related to underdistention or represent colitis. Clinical correlation is recommended. No bowel obstruction. The appendix is not visualized with certainty. No inflammatory changes identified in the right lower quadrant. Vascular/Lymphatic: Mild aortoiliac atherosclerotic disease. The IVC is unremarkable. No portal venous gas. There is no adenopathy. Reproductive: The uterus is anteverted and grossly unremarkable. There is a 5.5 cm left adnexal cyst. Because this lesion is not adequately characterized, prompt [place] is recommended for further evaluation. Note: This recommendation does not apply to premenarchal patients and to those with increased risk (genetic, family history, elevated tumor markers or other high-risk factors) of ovarian cancer. Reference: JACR 2020 Feb; 17(2):248-254 Other: Midline vertical anterior pelvic wall incisional scar. Stranding with small pockets of air in the subcutaneous soft tissues of the left anterior pelvic wall. No drainable fluid collection. Musculoskeletal: Osteopenia with degenerative changes of the spine. No  acute osseous pathology. IMPRESSION: 1. Bilateral lower lobe and bibasilar streaky densities may represent atelectasis/scarring. Atypical infiltrate is less likely but not excluded. 2. Liquid stool throughout the colon compatible with diarrhea state. Thickened appearance of the rectosigmoid may be related to underdistention or represent colitis. No bowel obstruction. 3. No hydronephrosis or nephrolithiasis. 4. A 5.5 cm left adnexal cyst. 5. Aortic Atherosclerosis (ICD10-I70.0). Electronically Signed   By: Anner Crete M.D.   On: 12/15/2020 16:28   CT CHEST WO CONTRAST  Result Date: 12/15/2020 CLINICAL DATA:  65 year old female with fever of unknown origin. EXAM: CT CHEST, ABDOMEN AND PELVIS WITHOUT CONTRAST TECHNIQUE: Multidetector CT imaging of the chest, abdomen and pelvis was performed following the standard protocol without IV contrast. COMPARISON:  Chest radiograph dated 12/15/2020 and CT abdomen pelvis dated 12/07/2020 FINDINGS: Evaluation of this exam is limited in the absence of intravenous contrast. Evaluation is also limited due to streak artifact caused by patient's arms. CT CHEST FINDINGS Cardiovascular: Borderline cardiomegaly. No pericardial effusion. Mild atherosclerotic calcification of the aortic arch. No aneurysmal dilatation. There is mild dilatation of the main pulmonary trunk suggestive of a degree of pulmonary hypertension. Clinical correlation is recommended. Mediastinum/Nodes: No hilar or mediastinal adenopathy. The esophagus is grossly unremarkable. No mediastinal fluid collection. Lungs/Pleura: Bilateral lower lobe and bibasilar streaky densities may represent atelectasis/scarring. Atypical infiltrate is less likely  but not excluded clinical correlation is recommended. There is no pleural effusion pneumothorax. The central airways are patent. Musculoskeletal: Mild osteopenia and mild degenerative changes of the spine. No acute osseous pathology. CT ABDOMEN PELVIS FINDINGS No  intra-abdominal free air or free fluid. Hepatobiliary: The liver is grossly unremarkable. No intrahepatic biliary ductal dilatation. Cholecystectomy. Pancreas: Unremarkable. No pancreatic ductal dilatation or surrounding inflammatory changes. Spleen: Normal in size without focal abnormality. Adrenals/Urinary Tract: The adrenal glands unremarkable. There is no hydronephrosis or nephrolithiasis on either side. There is irregularity of the right renal contour. Subcentimeter exophytic high attenuating lesion from the inferior pole of the right kidney is not characterized on this CT. Ultrasound may provide better evaluation. The visualized ureters and urinary bladder appear unremarkable. Stomach/Bowel: There is gaseous distension of the colon. Liquid stool throughout the colon compatible with diarrhea state. There is thickened appearance of the rectosigmoid which may be related to underdistention or represent colitis. Clinical correlation is recommended. No bowel obstruction. The appendix is not visualized with certainty. No inflammatory changes identified in the right lower quadrant. Vascular/Lymphatic: Mild aortoiliac atherosclerotic disease. The IVC is unremarkable. No portal venous gas. There is no adenopathy. Reproductive: The uterus is anteverted and grossly unremarkable. There is a 5.5 cm left adnexal cyst. Because this lesion is not adequately characterized, prompt [place] is recommended for further evaluation. Note: This recommendation does not apply to premenarchal patients and to those with increased risk (genetic, family history, elevated tumor markers or other high-risk factors) of ovarian cancer. Reference: JACR 2020 Feb; 17(2):248-254 Other: Midline vertical anterior pelvic wall incisional scar. Stranding with small pockets of air in the subcutaneous soft tissues of the left anterior pelvic wall. No drainable fluid collection. Musculoskeletal: Osteopenia with degenerative changes of the spine. No acute osseous  pathology. IMPRESSION: 1. Bilateral lower lobe and bibasilar streaky densities may represent atelectasis/scarring. Atypical infiltrate is less likely but not excluded. 2. Liquid stool throughout the colon compatible with diarrhea state. Thickened appearance of the rectosigmoid may be related to underdistention or represent colitis. No bowel obstruction. 3. No hydronephrosis or nephrolithiasis. 4. A 5.5 cm left adnexal cyst. 5. Aortic Atherosclerosis (ICD10-I70.0). Electronically Signed   By: Anner Crete M.D.   On: 12/15/2020 16:28   DG CHEST PORT 1 VIEW  Result Date: 12/15/2020 CLINICAL DATA:  Sepsis EXAM: PORTABLE CHEST 1 VIEW COMPARISON:  12/11/2020 FINDINGS: Left basilar scarring unchanged. Pulmonary insufflation is stable. No superimposed focal pulmonary infiltrate. No pneumothorax or pleural effusion. Cardiac size is at the upper limits of normal. IMPRESSION: No active disease. Electronically Signed   By: Fidela Salisbury MD   On: 12/15/2020 06:35   ECHOCARDIOGRAM COMPLETE  Result Date: 12/15/2020    ECHOCARDIOGRAM REPORT   Patient Name:   Lauren Barber Date of Exam: 12/15/2020 Medical Rec #:  [id number]       Height:       69.0 in Accession #:    [id number]      Weight:       258.2 lb Date of Birth:  06-04-1956       BSA:          2.303 m Patient Age:    65 years        BP:           95/65 mmHg Patient Gender: F               HR:           106 bpm. Exam Location:  Inpatient  Procedure: 2D Echo, Cardiac Doppler, Color Doppler and Intracardiac            Opacification Agent Indications:    Fever  History:        Patient has no prior history of Echocardiogram examinations.                 Risk Factors:Hypertension and Diabetes. CKD.  Sonographer:    Clayton Lefort RDCS (AE) Referring Phys: Francis Dowse Gwyndolyn Saxon A HENSEL  Sonographer Comments: Technically challenging study due to limited acoustic windows, Technically difficult study due to poor echo windows and suboptimal apical window. Image acquisition challenging  due to patient body habitus. IMPRESSIONS  1. Left ventricular ejection fraction, by estimation, is 40 to 45%. The left ventricle has mildly decreased function. The left ventricle demonstrates global hypokinesis. There is mild left ventricular hypertrophy. Left ventricular diastolic parameters are indeterminate.  2. Right ventricular systolic function is normal. The right ventricular size is normal. There is normal pulmonary artery systolic pressure. The estimated right ventricular systolic pressure is [id number] mmHg.  3. The mitral valve is normal in structure. Trivial mitral valve regurgitation. No evidence of mitral stenosis.  4. The aortic valve is tricuspid. Aortic valve regurgitation is mild. Mild aortic valve sclerosis is present, with no evidence of aortic valve stenosis.  5. The inferior vena cava is normal in size with greater than 50% respiratory variability, suggesting right atrial pressure of 3 mmHg.  6. Technically difficult study with poor acoustic windows. FINDINGS  Left Ventricle: Left ventricular ejection fraction, by estimation, is 40 to 45%. The left ventricle has mildly decreased function. The left ventricle demonstrates global hypokinesis. Definity contrast agent was given IV to delineate the left ventricular  endocardial borders. The left ventricular internal cavity size was normal in size. There is mild left ventricular hypertrophy. Left ventricular diastolic parameters are indeterminate. Right Ventricle: The right ventricular size is normal. No increase in right ventricular wall thickness. Right ventricular systolic function is normal. There is normal pulmonary artery systolic pressure. The tricuspid regurgitant velocity is 2.71 m/s, and  with an assumed right atrial pressure of 3 mmHg, the estimated right ventricular systolic pressure is [id number] mmHg. Left Atrium: Left atrial size was normal in size. Right Atrium: Right atrial size was normal in size. Pericardium: There is no evidence of pericardial  effusion. Mitral Valve: The mitral valve is normal in structure. Trivial mitral valve regurgitation. No evidence of mitral valve stenosis. Tricuspid Valve: The tricuspid valve is normal in structure. Tricuspid valve regurgitation is mild. Aortic Valve: The aortic valve is tricuspid. Aortic valve regurgitation is mild. Mild aortic valve sclerosis is present, with no evidence of aortic valve stenosis. Aortic valve mean gradient measures 6.0 mmHg. Aortic valve peak gradient measures 11.8 mmHg. Aortic valve area, by VTI measures 1.95 cm. Pulmonic Valve: The pulmonic valve was normal in structure. Pulmonic valve regurgitation is trivial. Aorta: The aortic root is normal in size and structure. Venous: The inferior vena cava is normal in size with greater than 50% respiratory variability, suggesting right atrial pressure of 3 mmHg. IAS/Shunts: No atrial level shunt detected by color flow Doppler.  LEFT VENTRICLE PLAX 2D LVIDd:         4.50 cm LVIDs:         3.30 cm LV PW:         2.40 cm LV IVS:        1.10 cm LVOT diam:     2.10 cm LV SV:  52 LV SV Index:   23 LVOT Area:     3.46 cm  RIGHT VENTRICLE             IVC RV Basal diam:  3.10 cm     IVC diam: 0.90 cm RV S prime:     14.10 cm/s TAPSE (M-mode): 2.5 cm LEFT ATRIUM             Index       RIGHT ATRIUM          Index LA diam:        2.50 cm 1.09 cm/m  RA Area:     9.85 cm LA Vol (A2C):   50.7 ml 22.02 ml/m RA Volume:   19.30 ml 8.38 ml/m LA Vol (A4C):   49.4 ml 21.45 ml/m LA Biplane Vol: 49.9 ml 21.67 ml/m  AORTIC VALVE AV Area (Vmax):    2.17 cm AV Area (Vmean):   2.12 cm AV Area (VTI):     1.95 cm AV Vmax:           172.00 cm/s AV Vmean:          111.000 cm/s AV VTI:            0.266 m AV Peak Grad:      11.8 mmHg AV Mean Grad:      6.0 mmHg LVOT Vmax:         108.00 cm/s LVOT Vmean:        67.900 cm/s LVOT VTI:          0.150 m LVOT/AV VTI ratio: 0.56  AORTA Ao Root diam: 2.90 cm Ao Asc diam:  3.00 cm TRICUSPID VALVE TR Peak grad:   29.4 mmHg TR  Vmax:        271.00 cm/s  SHUNTS Systemic VTI:  0.15 m Systemic Diam: 2.10 cm Loralie Champagne MD Electronically signed by Loralie Champagne MD Signature Date/Time: 12/15/2020/11:49:31 PM    Final    Medications: . sodium chloride 250 mL (12/15/20 2247)  . piperacillin-tazobactam (ZOSYN)  IV 3.375 g (12/16/20 0610)  . sodium bicarbonate in D5W 1000 mL infusion 125 mL/hr at 12/16/20 1210   . heparin  5,000 Units Subcutaneous Q8H  . levothyroxine  25 mcg Intravenous Daily  . liver oil-zinc oxide   Topical BID    Assessment/Plan: **AKI on CKD 4:  Severe AKI in setting of UTI and AMS x 1 week prior ? Prerenal with poor po intake, ATN with sepsis from UTI.  CK modestly elevated as well.  Renal US without obstruction; kidneys small.  Last Cr for comparison 09/2017 2.1.  Cr 5.5 on presentation and has improved some with supportive care now Cr 2.6, BUN 109.  Not a long term dialysis candidate discussed with mother; thankfully AKI improving.   **AMS: Suspect multifactorial, uremia certainly could be playing part per above.  Primary holding neuroactive meds currently too and treating underlying infection.    **UTI: abx per primary for PsA and VRE  **Metabolic acidosis:  Cont supplemental IV bicarb.   **Hypernatremia:  Serum sodium 155 today.  Switch d5W with 1 amp bicarb.   **Anemia: was in 10s now 8s, I suspect it was hemoconcentrated, no need for ESA.   I will sign off given renal function improving.  Please let me know if we need to be reinvolved.   Jannifer Hick MD 12/16/2020, 1:52 PM  [facility] Pager: [phone]

## 2020-12-16 NOTE — Progress Notes (Signed)
Pt more alert and able to better communicate. Pt able to state name. Pt instructed this RN to turn on the TV and change the channel. Unable to understand much more. Will continue to monitor.

## 2020-12-16 NOTE — Progress Notes (Signed)
Pt repeatedly requesting water.

## 2020-12-16 NOTE — Consult Note (Signed)
Palliative Medicine Inpatient Consult Note  Reason for consult:  GOC discussion  HPI:  Per intake H&P --> Lauren Harrisonis a 65 y.o.femalep[place].PMH is significant for CKD, T2DM, depression, GERD, hyperlipidemia, hypothyroidism, history of CVA, sacral ulcers.  Palliative care was asked to get involved to discuss goals of care.  Clinical Assessment/Goals of Care:  *Please note that this is a verbal dictation therefore any spelling or grammatical errors are due to the "[place] One" system interpretation.  I have reviewed medical records including EPIC notes, labs and imaging, received report from bedside RN, assessed the patient who was lying in bed in no distress. She was soft spoke and her words took sometime to come out sounding a bit gurgled.    I called Lauren Barber (mother) and Lauren Barber (son) to further discuss diagnosis prognosis, [place], EOL wishes, disposition and options.   I introduced Palliative Medicine as specialized medical care for people living with serious illness. It focuses on providing relief from the symptoms and stress of a serious illness. The goal is to improve quality of life for both the patient and the family.  Lauren Barber shares that Lauren Barber is from [place], [place]. She has never been married. She has two children, a son, Lauren Barber and a son, Lauren Barber. She has five grandchildren. 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 She gets joy out of seeing her children and her mother. She is a religious woman and a member of "[employer]".   [place] has been fairly debilitated for the past ten years. She has been at [facility] home.She has been fully dependent for all basic activities of daily living.   A detailed discussion was had today regarding advanced directives - there are none on file though Lauren Barber's mother speaks for her.    Concepts specific to code status, artifical feeding and  hydration, continued IV antibiotics and rehospitalization was had.  We discussed [name]'s past medical history. We discussed that she would likely not tolerate a code well and end up even more debilitated. Lauren Barber agrees and share that she just went through this with her husband. She has made [place] a DNAR/DNI code status.   We discussed Lauren Barber's present hospitalization inclusive of her severe sepsis. We discussed the hope for her encephalopathy to improve though the reality that she may reach a new baseline level of functioning. I shared that in terms of overall recovery this will remain to be seen.   An OP Palliative provider would be of benefit if Lauren Barber neglects to improve to aid in ongoing conversations.   Discussed the importance of continued conversation with family and their  medical providers regarding overall plan of care and treatment options, ensuring decisions are within the context of the patients values and GOCs.  Decision Maker: Lauren Barber (mother) [phone]  SUMMARY OF RECOMMENDATIONS   DNAR/DNI  Placed a Gold DNR on the chart  Plan to have a family meeting tomorrow at 11AM to complete a MOST and discuss course moving forward  TOC - OP Palliative Support  Spiritual - Appreciate prayer  Ongoing PMT support  Code Status/Advance Care Planning: DNAR/DNI   Palliative Prophylaxis:   Oral Care, Mobility  Additional Recommendations (Limitations, Scope, Preferences):  Continue current scope of care (limited interventions)   Psycho-social/Spiritual:   Desire for further Chaplaincy support: Yes [employer]  Additional Recommendations: Education on chronic comorbidities   Prognosis: Poor in the setting of multiple co-mobidities and very severe frailty. At a  very high 12 month mortality risk  Discharge Planning: Discharge plan will depend on clinical progress  Vitals:   12/16/20 1011 12/16/20 1013  BP: 95/69 95/69  Pulse: (!) 105 (!) 105  Resp: (!) 21 (!)  21  Temp: 99.6 F (37.6 C) 99.6 F (37.6 C)  SpO2: 99%     Intake/Output Summary (Last 24 hours) at 12/16/2020 1032 Last data filed at 12/16/2020 1000 Gross per 24 hour  Intake 1135.54 ml  Output 1160 ml  Net -24.46 ml   Last Weight  Most recent update: 12/16/2020  5:11 AM   Weight  117.8 kg (259 lb 11.2 oz)           Gen:  Obese AA F in NAD HEENT: Dry mucous membranes CV: Irregular rate and rhythm  PULM:  RA ABD: soft/nontender  EXT: No edema  Neuro: Oriented to self  PPS: 10%   This conversation/these recommendations were discussed with patient primary care team - Family medicine  Time In: 0650 Time Out: 0800 Total Time: 70 Greater than 50%  of this time was spent counseling and coordinating care related to the above assessment and plan.  [place] Team Team Cell Phone: [phone] Please utilize secure chat with additional questions, if there is no response within 30 minutes please call the above phone number  Palliative Medicine Team providers are available by phone from 7am to 7pm daily and can be reached through the team cell phone.  Should this patient require assistance outside of these hours, please call the patient's attending physician.

## 2020-12-17 DIAGNOSIS — Z515 Encounter for palliative care: Secondary | ICD-10-CM

## 2020-12-17 LAB — PROCALCITONIN: Procalcitonin: 9.94 ng/mL

## 2020-12-17 LAB — GLUCOSE, CAPILLARY
Glucose-Capillary: 95 mg/dL (ref 70–99)
Glucose-Capillary: 102 mg/dL — ABNORMAL HIGH (ref 70–99)

## 2020-12-17 LAB — CBC
HCT: 27.8 % — ABNORMAL LOW (ref 36.0–46.0)
Hemoglobin: 8.7 g/dL — ABNORMAL LOW (ref 12.0–15.0)
MCH: 31.3 pg (ref 26.0–34.0)
MCHC: 31.3 g/dL (ref 30.0–36.0)
MCV: 100 fL (ref 80.0–100.0)
Platelets: 67 10*3/uL — ABNORMAL LOW (ref 150–400)
RBC: 2.78 MIL/uL — ABNORMAL LOW (ref 3.87–5.11)
RDW: 17.5 % — ABNORMAL HIGH (ref 11.5–15.5)
WBC: 6.4 10*3/uL (ref 4.0–10.5)
nRBC: 0.6 % — ABNORMAL HIGH (ref 0.0–0.2)

## 2020-12-17 LAB — BASIC METABOLIC PANEL
Anion gap: 19 — ABNORMAL HIGH (ref 5–15)
BUN: 104 mg/dL — ABNORMAL HIGH (ref 8–23)
CO2: 16 mmol/L — ABNORMAL LOW (ref 22–32)
Calcium: 9 mg/dL (ref 8.9–10.3)
Chloride: 119 mmol/L — ABNORMAL HIGH (ref 98–111)
Creatinine, Ser: 2.57 mg/dL — ABNORMAL HIGH (ref 0.44–1.00)
GFR, Estimated: 21 mL/min — ABNORMAL LOW (ref >60)
Glucose, Bld: 105 mg/dL — ABNORMAL HIGH (ref 70–99)
Potassium: 3.6 mmol/L (ref 3.5–5.1)
Sodium: 154 mmol/L — ABNORMAL HIGH (ref 135–145)

## 2020-12-17 LAB — EPSTEIN-BARR VIRUS EARLY D ANTIGEN ANTIBODY, IGG: EBV Early Antigen Ab, IgG: <9 U/mL (ref 0.0–8.9)

## 2020-12-17 LAB — RHEUMATOID FACTOR: Rheumatoid fact SerPl-aCnc: 278.5 IU/mL — ABNORMAL HIGH (ref <14.0)

## 2020-12-17 LAB — MAGNESIUM: Magnesium: 2 mg/dL (ref 1.7–2.4)

## 2020-12-17 LAB — PHOSPHORUS: Phosphorus: 3.4 mg/dL (ref 2.5–4.6)

## 2020-12-17 LAB — EPSTEIN-BARR VIRUS NUCLEAR ANTIGEN ANTIBODY, IGG: EBV NA IgG: >600 U/mL — ABNORMAL HIGH (ref 0.0–17.9)

## 2020-12-17 MED ORDER — GLYCOPYRROLATE 0.2 MG/ML IJ SOLN: 0.4000 mg | INTRAMUSCULAR | Status: DC | PRN

## 2020-12-17 MED ORDER — ONDANSETRON 4 MG PO TBDP
4.0000 mg | ORAL_TABLET | Freq: Four times a day (QID) | ORAL | Status: DC | PRN
  Filled 2020-12-17: qty 1

## 2020-12-17 MED ORDER — FENTANYL CITRATE (PF) 100 MCG/2ML IJ SOLN: 50.0000 ug | INTRAMUSCULAR | Status: DC | PRN

## 2020-12-17 MED ORDER — BISACODYL 10 MG RE SUPP: 10.0000 mg | Freq: Every day | RECTAL | Status: DC | PRN

## 2020-12-17 MED ORDER — LORAZEPAM 2 MG/ML IJ SOLN
0.5000 mg | INTRAMUSCULAR | Status: DC | PRN
  Administered 2020-12-19: 0.5 mg via INTRAVENOUS
  Filled 2020-12-17: qty 1

## 2020-12-17 MED ORDER — ONDANSETRON HCL 4 MG/2ML IJ SOLN: 4.0000 mg | Freq: Four times a day (QID) | INTRAMUSCULAR | Status: DC | PRN

## 2020-12-17 MED ORDER — ACETAMINOPHEN 325 MG PO TABS: 650.0000 mg | ORAL_TABLET | Freq: Four times a day (QID) | ORAL | Status: DC | PRN

## 2020-12-17 MED ORDER — FENTANYL CITRATE (PF) 100 MCG/2ML IJ SOLN
25.0000 ug | Freq: Four times a day (QID) | INTRAMUSCULAR | Status: DC
  Administered 2020-12-17 – 2020-12-19 (×8): 25 ug via INTRAVENOUS
  Filled 2020-12-17 (×8): qty 2

## 2020-12-17 MED ORDER — POLYVINYL ALCOHOL 1.4 % OP SOLN
1.0000 [drp] | Freq: Four times a day (QID) | OPHTHALMIC | Status: DC | PRN
Start: 2020-12-17 — End: 2020-12-20
  Filled 2020-12-17: qty 15

## 2020-12-17 MED ORDER — BIOTENE DRY MOUTH MT LIQD: 15.0000 mL | OROMUCOSAL | Status: DC | PRN

## 2020-12-17 MED ORDER — ACETAMINOPHEN 650 MG RE SUPP: 650.0000 mg | Freq: Four times a day (QID) | RECTAL | Status: DC | PRN

## 2020-12-17 NOTE — Progress Notes (Signed)
   12/17/20 0355  Assess: MEWS Score  Temp 99.1 F (37.3 C)  BP (!) 89/63  Pulse Rate (!) 114  ECG Heart Rate (!) 113  Resp (!) 23  Level of Consciousness Alert  SpO2 100 %  Assess: MEWS Score  MEWS Temp 0  MEWS Systolic 1  MEWS Pulse 2  MEWS RR 1  MEWS LOC 0  MEWS Score 4  Assess: if the MEWS score is Yellow or Red  Were vital signs taken at a resting state? Yes  Focused Assessment No change from prior assessment  Early Detection of Sepsis Score *See Row Information* High  MEWS guidelines implemented *See Row Information* Yes  Treat  MEWS Interventions Administered prn meds/treatments  Pain Scale PAINAD  Pain Intervention(s) Medication (See eMAR)  Breathing 1  Negative Vocalization 2  Facial Expression 0  Body Language 0  Consolability 0  PAINAD Score 3  Take Vital Signs  Increase Vital Sign Frequency  Red: Q 1hr X 4 then Q 4hr X 4, if remains red, continue Q 4hrs  Escalate  MEWS: Escalate Red: discuss with charge nurse/RN and provider, consider discussing with RRT  Notify: Charge Nurse/RN  Name of Charge Nurse/RN Notified Jaquetta  Date Charge Nurse/RN Notified 12/17/20  Time Charge Nurse/RN Notified 0400  Notify: Provider  Provider Name/Title Sun  Date Provider Notified 12/17/20  Time Provider Notified [phone]  Notification Type Call  Notification Reason Change in status  Response No new orders   Notified Dr Nancy Fetter of Red MEWs based on above VS, including axillary temp of 101.7.  Rectal tylenol given.  Pt already getting aggressive hydration (D5 w/ 50 bicarb at 125/hr) per nephrology + IV zosyn.  BP and pulse in normal range for patient.  Respirations elevated but patient also more agitated and calling out.   Discussed at length with MD.  No new orders at this time and will notify if VS worsen

## 2020-12-17 NOTE — Social Work (Signed)
  CSW was alerted that pt's family wanted to pursue placement at [facility]. CSW spoke to [employer] and informed her of referral. She informed CSW that [facility] does not have any beds available today.

## 2020-12-17 NOTE — Plan of Care (Signed)
  Problem: Pain Managment: Goal: General experience of comfort will improve Outcome: Adequate for Discharge   

## 2020-12-17 NOTE — Progress Notes (Signed)
Palliative Medicine Inpatient Follow Up Note  Reason for consult:  GOC discussion  HPI:  Per intake H&P --> Lauren Harrisonis a 65 y.o.[place]em[facility] [facility].PMH is significant for CKD, T2DM, depression, GERD, hyperlipidemia, hypothyroidism, history of CVA, sacral ulcers.  Palliative care was asked to get involved to discuss goals of care.  Today's Discussion (12/17/2020 ):  *Please note that this is a verbal dictation therefore any spelling or grammatical errors are due to the "[place] One" system interpretation.  Chart reviewed.  It appears that [name] had a core track placed yesterday.  Lauren Barber remains alert and oriented to herself.  Per chart review she remains n.p.o. in the setting of altered mental state.  Per discussion with [name]'s RN she has been moaning throughout the day indicating some discomfort. We discussed giving her some low dose medication to help this around the clock.   Lauren Barber's mother, Lauren Barber and I had a meeting today though she was unable to attend in person due to the present weather conditions.  We reviewed Lauren Barber's hospitalization inclusive of her sepsis from her urinary infection which caused acute renal failure.  We reviewed that Lauren Barber's mental state has not completely cleared and it appears that she does have an ongoing delirium which unfortunately contributes greatly to an increased mortality in patients such as her.  We discussed Stigall is exceptionally poor protein malnutrition and her pressure injuries.  Lauren Barber shares with me that Lauren Barber has "suffered for a long time".  I asked her how aggressive we should be moving forward in terms of care and does she feel at this point time it might be of value to consider hospice given all Lauren Barber has been through.  We again reviewed that Lauren Barber just went through hospice care with her husband though his situation was a little clearer as he had cancer.  We discussed  that if Lauren Barber does not eat or drink for sustained period of time that she will not survive but a few weeks at the most.  Lauren Barber expresses that Lauren Barber has been through a lot and she again, would not want her to suffer any more than she already has.   We talked about transition to comfort measures in house and what that would entail inclusive of medications to control pain, dyspnea, agitation, nausea, itching, and hiccups.  We discussed stopping all uneccessary measures such as blood draws, needle sticks, and frequent vital signs. Lauren Barber and I discussed starting this process in the hospital and eventually the patient transitioning to [facility] on [address]. We talked about allowing all of the people who are most important to Lauren Barber to visit and spend quality time with her.   Patient RN, Palma Holter updated on the plan.  Dr. Rock Nephew and I discussed Lauren Barber's clinical case and the patients mother decision for comfort oriented care.  Questions and concerns addressed   Objective Assessment: Vital Signs Vitals:   12/17/20 1000 12/17/20 1100  BP: 96/65 90/70  Pulse: 96 90  Resp: (!) 21 (!) 21  Temp:  98.2 F (36.8 C)  SpO2: 99% 99%    Intake/Output Summary (Last 24 hours) at 12/17/2020 1204 Last data filed at 12/17/2020 1000 Gross per 24 hour  Intake 420.44 ml  Output 450 ml  Net -29.56 ml   Last Weight  Most recent update: 12/17/2020  3:35 AM   Weight  117.1 kg (258 lb 2.5 oz)           Gen:  Obese AA F  in NAD HEENT: Dry mucous membranes CV: regular rate and rhythm  PULM:  RA ABD: soft/nontender  EXT: No edema  Neuro: Oriented to self  Decision Maker: [name] (mother) [phone]  SUMMARY OF RECOMMENDATIONS DNAR/DNI  Placed a Gold DNR on the chart  Transitioned to comfort focused care  Added comfort medications per [facility] with low dose fentanyl ATC for generalized pain as patient intermittently moaning  Will stop IV Abx and IVF  DC Tele  DC  Coretrack  Liberalize visitation policy  TOC - Referral made to [facility]  Ongoing support from PMT  Time Spent: 55 Greater than 50% of the time was spent in counseling and coordination of care ______________________________________________________________________________________ [place] Team Team Cell Phone: [phone] Please utilize secure chat with additional questions, if there is no response within 30 minutes please call the above phone number  Palliative Medicine Team providers are available by phone from 7am to 7pm daily and can be reached through the team cell phone.  Should this patient require assistance outside of these hours, please call the patient's attending physician.

## 2020-12-17 NOTE — Progress Notes (Signed)
Family Medicine Teaching Service Daily Progress Note Intern Pager: [phone]  Patient name: Lauren Barber Medical record number: [id number] Date of birth: 21-Apr-1956 Age: 65 y.o. Gender: female  Primary Care Provider: Salome Arnt, [place] Consultants: Nephrology, ID, palliative, CCM (s/o) Code Status: DNR  Pt Overview and Major Events to Date:  1/23: admitted, nephro consulted 1/25: started Zosyn 1/27: ID, CCM and palliative consulted  Assessment and Plan:  Lauren Harrisonis a 65 y.o.femalep[facility]ing [facility].PMH is significant for CKD, T2DM, depression, GERD, hyperlipidemia, hypothyroidism, history of CVA, sacral ulcers.  Severe Sepsis  Urinary Tract Infection Improving- patient no longer meeting severe sepsis criteria. Presumed to be secondary to pseudomonas and VRE UTI. Still intermittently tachycardic into the low 100s but BP slightly improved, patient did spike fever to 101 F overnight (1/28). WBC remains wnl. Mental status improving, but still not at baseline.  -ID following, appreciate recommendations (follow-up EBV, ANA) -Palliative consulted, appreciate their expertise with [place] discussion. Patient now DNR -Continue Zosyn (1/25- ), linezolid discontinued -f/u repeat culture results from 1/27 - currently no growth at 1 day -Monitor vitals closely -Continue IV fluids, spoke with nephro and we agreed upon 58mEq sodium bicarb in D5 @ 125cc/hr (will also give 1 amp push of bicarb)  Acute Renal Failure on CKD  AG Metabolic Acidosis Overall improved, stabilizing, creatinine 2.53> 2.57 today 1/29.  BUN stable at 102 and 104, est GFR ~20. Creatinine 5.5 on admission (baseline ~2). Patient had 420cc recorded output from first prior the day, no urine output recorded from overnight because patient had some urinary incontinence overnight..   -Nephro following, appreciate recommendations -Per nephro, patient is poor dialysis candidate -45mEq sodium  bicarb in D5 @ 125cc/hr -1 amp push of bicarb -Monitor BMP  Altered Mental Status Continues to improve. On exam she is alert and interactive, speech remains very difficult to understand. Etiology of her altered mental status thought to be secondary to uremic encephalopathy and sepsis. -Continue to monitor closely -Treatment of sepsis and renal failure as above -Repeat swallow eval by SLP given improved mentation -Family meeting with palliative 1/29 at 11am  Hypernatremia Na more stable this morning, 150>155>154 today 1/29.  -Continue IV fluids to 62mEqm sodium bicarb in D5 @ 125 cc/hr (previously was getting 100 mEq sodium bicarb) -Monitor BMP  Hypotension  Hx of Hypertension BPs have been low-normal overnight and this morning. Most recent BP 96/60. Takes torsemide 10mg  daily and spironolactone 25mg  daily at home for hypertension. -Holding home meds in the setting of hypotension  T2DM Very well controlled at baseline- A1c 6.2%. Glucose 95 this morning. Patient has remained NPO since admission due to altered mental status. Home meds: Januvia and Trulicity -Holding home meds -CBG monitoring while patient remains n.p.o. and is receiving D5 -Repeat SLP eval   Severe Protein Calorie Malnutrition Albumin 2.0.  -Consider nutrition consult when patient is more stable -Remains NPO for now due to altered mental status -Cortrak was placed 1/28 by CCM, no intention of using it at this time, use pending palliative discussion with family 1/29  History of CVA  Hyperlipidemia Patient bedbound at baseline 2/2 deficits from prior stroke. On Atorvastatin 20mg  daily and ASA 81 mg daily at home. Also on tizanidine 2mg  QID for spasticity -Holding home meds due to NPO status and altered mental status  Hypothyroidism Chronic, stable. Home medications include Synthroid 50 mcgdaily. [place] admission (2.832). -Continue IV Synthroid 25 mcg daily  Depression Home meds include fluoxetine 50mg  daily  and desvenlafaxine 100mg  daily -  Holding home meds due to NPO status  Gout Chronic, stable.Home medications include colchicine 0.6 mgBID andallopurinol 200 mg/day. -Holding home medications for now  GERD Chronic, stable.Home medications include Pepcid 20 mg/day -Holding home medications due to altered mental status  Sacral Skin Changes Patient was evaluated by wound care- her sacral skin changes deemed to be scar tissue, not active wounds. -Desitin prn   FEN/GI: NPO (failed swallow eval 1/26) PPx: Heparin   Status is: Inpatient Remains inpatient appropriate because:Ongoing diagnostic testing needed not appropriate for outpatient work up and Inpatient level of care appropriate due to severity of illness   Dispo:  Patient From: [place]  Planned Disposition: [facility]  Expected discharge date: 12/19/2020  Medically stable for discharge: No      Subjective:  Patient more alert this morning, speech remains very difficult to understand, however she was able to communicate that she was warm in her bed so we turned the temperature down in the room to help her feel more comfortable.  Also expressed she did not want to be rolled over so that her bottom could be looked at.  Objective: Temp:  [98.2 F (36.8 C)-101.7 F (38.7 C)] 98.2 F (36.8 C) (01/29 0749) Pulse Rate:  [97-115] 97 (01/29 0749) Resp:  [17-33] 20 (01/29 0749) BP: (89-101)/(60-71) 96/60 (01/29 0749) SpO2:  [96 %-100 %] 99 % (01/29 0749) Weight:  [117.1 kg] 117.1 kg (01/29 0325) Physical Exam: General: alert, obese female, resting in bed comfortably HEENT: PERRL Cardiovascular: tachycardic to 100, regular rhythm, normal S1/S2 Respiratory: CTA, breath sounds distant secondary to habitus, comfortable work of breathing Abdomen: obese, soft, nondistended, nontender Extremities: Trace nonpitting pedal edema bilaterally, unchanged from prior Neuro: alert, oriented to person (not  time or place currently). Difficult to understand speech. Able to move R hand (3/5 strength), not moving lower extremities  Laboratory: Recent Labs  Lab 12/15/20 1110 12/16/20 0604 12/17/20 0456  WBC 6.3 6.8 6.4  HGB 8.8* 8.4* 8.7*  HCT 28.2* 26.6* 27.8*  PLT 99* 101* 67*   Recent Labs  Lab 12/13/20 0427 12/14/20 0340 12/14/20 2203 12/15/20 1110 12/16/20 0604 12/16/20 1802 12/17/20 0456  NA 145 150*   < >  --  155* 156* 154*  K 3.8 4.4   < >  --  4.0 3.8 3.6  CL 107 119*   < >  --  122* 122* 119*  CO2 24 16*   < >  --  16* 18* 16*  BUN 119* 133*   < >  --  109* 102* 104*  CREATININE 2.85* 3.11*   < >  --  2.62* 2.53* 2.57*  CALCIUM 8.1* 9.6   < >  --  9.0 8.9 9.0  PROT 6.1* 7.3  --  6.0*  --   --   --   BILITOT 0.7 1.1  --  0.7  --   --   --   ALKPHOS 116 151*  --  182*  --   --   --   ALT 153* 145*  --  147*  --   --   --   AST 82* 70*  --  132*  --   --   --   GLUCOSE 84 101*   < >  --  97 105* 105*   < > = values in this interval not displayed.    Imaging/Diagnostic Tests: CT ABDOMEN PELVIS WO CONTRAST CT CHEST WO CONTRAST Result Date: 12/15/2020 IMPRESSION: 1. Bilateral lower  lobe and bibasilar streaky densities may represent atelectasis/scarring. Atypical infiltrate is less likely but not excluded. 2. Liquid stool throughout the colon compatible with diarrhea state. Thickened appearance of the rectosigmoid may be related to underdistention or represent colitis. No bowel obstruction. 3. No hydronephrosis or nephrolithiasis. 4. A 5.5 cm left adnexal cyst. 5. Aortic Atherosclerosis (ICD10-I70.0).   ECHOCARDIOGRAM COMPLETE Result Date: 12/15/2020 IMPRESSIONS  1. Left ventricular ejection fraction, by estimation, is 40 to 45%. The left ventricle has mildly decreased function. The left ventricle demonstrates global hypokinesis. There is mild left ventricular hypertrophy. Left ventricular diastolic parameters are indeterminate.  2. Right ventricular systolic function is  normal. The right ventricular size is normal. There is normal pulmonary artery systolic pressure. The estimated right ventricular systolic pressure is [id number] mmHg.  3. The mitral valve is normal in structure. Trivial mitral valve regurgitation. No evidence of mitral stenosis.  4. The aortic valve is tricuspid. Aortic valve regurgitation is mild. Mild aortic valve sclerosis is present, with no evidence of aortic valve stenosis.  5. The inferior vena cava is normal in size with greater than 50% respiratory variability, suggesting right atrial pressure of 3 mmHg.  6. Technically difficult study with poor acoustic windows.   Daisy Floro, DO 12/17/2020, 8:47 AM PGY-3, [place] Intern pager: [phone], text pages welcome

## 2020-12-17 NOTE — Progress Notes (Signed)
Manufacturing engineer [facility])  Referral received for residential hospice at [facility].  There is not a bed to offer today and we do not anticipate our census to change over the next few days.  [facility] will update family and hospital once our bed status changes.  Venia Carbon RN, BSN, [facility] Liaison

## 2020-12-18 NOTE — Hospital Course (Signed)
Lauren Barber is a 65 y.o. female who presented from [place] with altered mental status. PMH is significant for CKD, T2DM, depression, GERD, hyperlipidemia, hypothyroidism, history of CVA, and sacral ulcers.  Altered Mental Status On presentation, patient was responsive only to sternal rub. Per the staff at [place], patient had been more somnolent with decreased PO intake in the 5-7 days leading up to admission. She had been diagnosed with a UTI and was receiving Keflex as an outpatient. Throughout admission, despite being treated for her UTI and renal failure, patient's mental status was not improving significantly. MRI brain and CT chest/abdomen/pelvis were all unremarkable. Her family ultimately elected to pursue hospice services.  Severe Sepsis  Pseudomonas and Enterococcus UTI Patient's urine culture grew >100,000 pansensitive pseudomonas and 50,000 vancomycin-resistant enterococcus. Patient met severe sepsis criteria with fever, tachycardia, hypotension, and lactic acidosis despite adequate antibiotic coverage and large amount of fluids. She was treated with Zosyn from 1/25-1/29, at which time she transitioned to comfort care. Workup with blood cultures, imaging, COVID swab, etc. was unrevealing for alternate source of infection.  Acute Renal Failure on CKD Patient's creatinine was 5.53 on admission (from a baseline ~2.5). In the following days her creatinine improved to 2.53 with supportive care (IV fluids), however her mental status did not improve and she was transitioned to comfort care.

## 2020-12-18 NOTE — Progress Notes (Addendum)
   Palliative Medicine Inpatient Follow Up Note  Reason for consult:  GOC discussion  HPI:  Per intake H&P --> Lauren Harrisonis a 65 y.o.[facility]em[facility].PMH is significant for CKD, T2DM, depression, GERD, hyperlipidemia, hypothyroidism, history of CVA, sacral ulcers.  Palliative care was asked to get involved to discuss goals of care.  Today's Discussion (12/18/2020 ):  *Please note that this is a verbal dictation therefore any spelling or grammatical errors are due to the "[place] One" system interpretation.  Chart reviewed.    I met with Lauren Barber this morning. She is able to open her eyes and stated some indiscernible words.  I called patients mother, Lauren Barber. Lauren Barber shares that she will be here at noon today with the patients son, Lauren Barber. We agreed to meet in person at that time. I shared that patient continues to be comfortable and appears in no distress this morning.   Questions and concerns addressed   Objective Assessment: Vital Signs Vitals:   12/17/20 1500 12/17/20 1942  BP: 114/64 116/86  Pulse: 95 84  Resp: (!) 21 (!) 24  Temp:  (!) 97.5 F (36.4 C)  SpO2: 97% 100%    Intake/Output Summary (Last 24 hours) at 12/18/2020 1610 Last data filed at 12/18/2020 0435 Gross per 24 hour  Intake 0 ml  Output 1450 ml  Net -1450 ml   Last Weight  Most recent update: 12/18/2020  4:35 AM   Weight  116.7 kg (257 lb 4.4 oz)           Gen:  Obese AA F in NAD HEENT: Dry mucous membranes CV: regular rate and rhythm  PULM:  RA ABD: soft/nontender  EXT: No edema  Neuro: Oriented to self  Decision Maker: Lauren Barber (mother) [phone]  SUMMARY OF RECOMMENDATIONS DNAR/DNI  Placed a Gold DNR on the chart  Transitioned to comfort focused care  Added comfort medications per [facility] with low dose fentanyl ATC for generalized pain as patient intermittently moaning  Will stop IV Abx and IVF  DC Tele  DC  Coretrack  Liberalize visitation policy  TOC - Referral made to [place] refer to below addendum as well  Time Spent: 15 Greater than 50% of the time was spent in counseling and coordination of care ____________________________________________________ Addendum:  I met with patients mother, Lauren Barber and her son, Lauren Barber this afternoon. We discussed Lauren Barber's hospitalization and the difficulties that she has contended with during admission. We reviewed the little progress she has seemed to make.   Confirmation of plan for transition to [facility] obtained.  Support provided by the PMT service.  Additional Time: 25 Greater than 50% of the time was spent in counseling and coordination of care  [place] Team Team Cell Phone: [phone] Please utilize secure chat with additional questions, if there is no response within 30 minutes please call the above phone number  Palliative Medicine Team providers are available by phone from 7am to 7pm daily and can be reached through the team cell phone.  Should this patient require assistance outside of these hours, please call the patient's attending physician.

## 2020-12-18 NOTE — Progress Notes (Signed)
   12/18/20 1140  Clinical Encounter Type  Visited With Health care provider  Visit Type Initial  Referral From Palliative care team   Chaplain responded to a palliative care consult. According to the patient's nurse tech, no family has come by today and the patient is confused. Spiritual care services available as needed.   Jeri Lager, Chaplain

## 2020-12-18 NOTE — Progress Notes (Signed)
Family Medicine Teaching Service Daily Progress Note Intern Pager: [phone]  Patient name: Lauren Barber Medical record number: [id number] Date of birth: [birth year] Age: 65 y.o. Gender: female  Primary Care Provider: Salome Barber, [place] Consultants: Nephrology, ID, palliative, CCM (s/o) Code Status: DNR  Pt Overview and Major Events to Date:  1/23: admitted, nephro consulted 1/25: started Zosyn 1/27: ID, CCM and palliative consulted  Assessment and Plan:  Lauren Harrisonis a 65 y.o.femalepresenting from [facility].PMH is significant for CKD, T2DM, depression, GERD, hyperlipidemia, hypothyroidism, history of CVA, sacral ulcers.  Severe Sepsis  Urinary Tract Infection Improving- patient no longer meeting severe sepsis criteria. Presumed to be secondary to pseudomonas and VRE UTI.  Patient has remained with that pulse rate between 84 and 102 over the last 24 hours.  Palliative care was consulted and after further discussion they have decided to move to comfort care.  Comfort care measures in place at this time. -ID following, appreciate recommendations (follow-up EBV, ANA) -Palliative consulted, appreciate their expertise with [place] discussion.  Patient has now been moved to comfort care and is awaiting placement at [facility] -Antibiotics discontinued -Comfort care vital requirements -IV fluids discontinued, patient can have comfort p.o.  Acute Renal Failure on CKD  AG Metabolic Acidosis Overall improved, stabilizing, creatinine 2.53> 2.57 today 1/29.  BUN stable at 102 and 104, est GFR ~20. Creatinine 5.5 on admission (baseline ~2).  Patient with 1450 mL of urine output over the last 24 hours. -Nephro following, appreciate recommendations -Per nephro, patient is poor dialysis candidate - discontinue sodium bicarb, patient is now comfort measures -Monitor BMP  Altered Mental Status Continues to improve.  Patient is interactive but very difficult to  understand today.  She is not oriented to place or time but is oriented to person.  Etiology of her altered mental status thought to be secondary to uremic encephalopathy and sepsis. -Continue to monitor closely -Comfort care measures at this time -Repeat swallow eval by SLP given improved mentation  Hypernatremia Na more stable this morning, 150>155>154 today 1/29.  -Continue IV fluids to 17mEqm sodium bicarb in D5 @ 125 cc/hr (previously was getting 100 mEq sodium bicarb) -Monitor BMP  Hypotension  Hx of Hypertension BPs have been low-normal overnight and this morning. Most recent BP 96/60. Takes torsemide 10mg  daily and spironolactone 25mg  daily at home for hypertension. -Holding home meds in the setting of hypotension  T2DM Very well controlled at baseline- A1c 6.2%. Glucose 95 this morning. Patient has remained NPO since admission due to altered mental status. Home meds: Januvia and Trulicity -Holding home meds -CBG monitoring while patient remains n.p.o. and is receiving D5 -Repeat SLP eval   Severe Protein Calorie Malnutrition Albumin 2.0.  -Consider nutrition consult when patient is more stable -Remains NPO for now due to altered mental status -Cortrak was placed 1/28 by CCM, no intention of using it at this time, use pending palliative discussion with family 1/29  History of CVA  Hyperlipidemia Patient bedbound at baseline 2/2 deficits from prior stroke. On Atorvastatin 20mg  daily and ASA 81 mg daily at home. Also on tizanidine 2mg  QID for spasticity -Holding home meds due to NPO status and altered mental status  Hypothyroidism Chronic, stable. Home medications include Synthroid 50 mcgdaily. Daisy admission (2.832). -Continue IV Synthroid 25 mcg daily  Depression Home meds include fluoxetine 50mg  daily and desvenlafaxine 100mg  daily -Holding home meds due to NPO status  Gout Chronic, stable.Home medications include colchicine 0.6 mgBID andallopurinol 200  mg/day. -Holding home  medications for now  GERD Chronic, stable.Home medications include Pepcid 20 mg/day -Holding home medications due to altered mental status  Sacral Skin Changes Patient was evaluated by wound care- her sacral skin changes deemed to be scar tissue, not active wounds. -Desitin prn   FEN/GI: NPO (failed swallow eval 1/26) PPx: Heparin   Status is:  Ready for discharge, patient is awaiting placement at [facility]:  Patient From: [place]  Planned Disposition: [facility]  Expected discharge date: 12/19/2020  Medically stable for discharge: No      Subjective:  Patient is comfortable this morning, resting when I enter the room but easily aroused.  Oriented to person but not place or time.  Reports she is extremely thirsty.  Discussed with nurse that she is allowed to have palliative p.o.  Objective: Temp:  [97.5 F (36.4 C)-101.7 F (38.7 C)] 97.5 F (36.4 C) (01/29 1942) Pulse Rate:  [84-115] 84 (01/29 1942) Resp:  [20-33] 24 (01/29 1942) BP: (89-126)/(60-86) 116/86 (01/29 1942) SpO2:  [96 %-100 %] 100 % (01/29 1942) Weight:  [117.1 kg] 117.1 kg (01/29 0325) Physical Exam: General: Sleeping comfortably when I enter the room, obese, no acute distress HEENT: PERRL Cardiovascular: Regular rate and rhythm with no murmurs appreciated Respiratory: CTA, breath sounds distant secondary to habitus, comfortable work of breathing Abdomen: obese, soft, nondistended, nontender Extremities: Trace nonpitting pedal edema bilaterally, unchanged from prior Neuro: Oriented to person but not place or time  Laboratory: Recent Labs  Lab 12/15/20 1110 12/16/20 0604 12/17/20 0456  WBC 6.3 6.8 6.4  HGB 8.8* 8.4* 8.7*  HCT 28.2* 26.6* 27.8*  PLT 99* 101* 67*   Recent Labs  Lab 12/13/20 0427 12/14/20 0340 12/14/20 2203 12/15/20 1110 12/16/20 0604 12/16/20 1802 12/17/20 0456  NA 145 150*   < >  --  155* 156* 154*  K 3.8 4.4   < >   --  4.0 3.8 3.6  CL 107 119*   < >  --  122* 122* 119*  CO2 24 16*   < >  --  16* 18* 16*  BUN 119* 133*   < >  --  109* 102* 104*  CREATININE 2.85* 3.11*   < >  --  2.62* 2.53* 2.57*  CALCIUM 8.1* 9.6   < >  --  9.0 8.9 9.0  PROT 6.1* 7.3  --  6.0*  --   --   --   BILITOT 0.7 1.1  --  0.7  --   --   --   ALKPHOS 116 151*  --  182*  --   --   --   ALT 153* 145*  --  147*  --   --   --   AST 82* 70*  --  132*  --   --   --   GLUCOSE 84 101*   < >  --  97 105* 105*   < > = values in this interval not displayed.    Imaging/Diagnostic Tests: CT ABDOMEN PELVIS WO CONTRAST CT CHEST WO CONTRAST Result Date: 12/15/2020 IMPRESSION: 1. Bilateral lower lobe and bibasilar streaky densities may represent atelectasis/scarring. Atypical infiltrate is less likely but not excluded. 2. Liquid stool throughout the colon compatible with diarrhea state. Thickened appearance of the rectosigmoid may be related to underdistention or represent colitis. No bowel obstruction. 3. No hydronephrosis or nephrolithiasis. 4. A 5.5 cm left adnexal cyst. 5. Aortic Atherosclerosis (ICD10-I70.0).   ECHOCARDIOGRAM COMPLETE Result Date:  12/15/2020 IMPRESSIONS  1. Left ventricular ejection fraction, by estimation, is 40 to 45%. The left ventricle has mildly decreased function. The left ventricle demonstrates global hypokinesis. There is mild left ventricular hypertrophy. Left ventricular diastolic parameters are indeterminate.  2. Right ventricular systolic function is normal. The right ventricular size is normal. There is normal pulmonary artery systolic pressure. The estimated right ventricular systolic pressure is [id number] mmHg.  3. The mitral valve is normal in structure. Trivial mitral valve regurgitation. No evidence of mitral stenosis.  4. The aortic valve is tricuspid. Aortic valve regurgitation is mild. Mild aortic valve sclerosis is present, with no evidence of aortic valve stenosis.  5. The inferior vena cava is normal in size  with greater than 50% respiratory variability, suggesting right atrial pressure of 3 mmHg.  6. Technically difficult study with poor acoustic windows.   Gifford Shave, MD 12/18/2020, 3:15 AM PGY-2, [place] Intern pager: [phone], text pages welcome

## 2020-12-18 NOTE — Progress Notes (Signed)
[facility] ([facility])   [facility] is unable to offer a room today. Hospital Liaison will follow up tomorrow or sooner if a room becomes available. Please do not hesitate to call with any questions.   Thank you, Melissa O'Bryant, BSN, RN Hospital Liaison [phone] 

## 2020-12-19 LAB — PROTEIN ELECTROPHORESIS, SERUM
A/G Ratio: 0.6 — ABNORMAL LOW (ref 0.7–1.7)
Albumin ELP: 2.3 g/dL — ABNORMAL LOW (ref 2.9–4.4)
Alpha-1-Globulin: 0.4 g/dL (ref 0.0–0.4)
Alpha-2-Globulin: 0.8 g/dL (ref 0.4–1.0)
Beta Globulin: 0.7 g/dL (ref 0.7–1.3)
Gamma Globulin: 1.7 g/dL (ref 0.4–1.8)
Globulin, Total: 3.6 g/dL (ref 2.2–3.9)
Total Protein ELP: 5.9 g/dL — ABNORMAL LOW (ref 6.0–8.5)

## 2020-12-19 LAB — ANTINUCLEAR ANTIBODIES, IFA: ANA Ab, IFA: NEGATIVE

## 2020-12-19 MED ORDER — FENTANYL CITRATE (PF) 100 MCG/2ML IJ SOLN
25.0000 ug | INTRAMUSCULAR | Status: DC
  Administered 2020-12-19 – 2020-12-20 (×7): 25 ug via INTRAVENOUS
  Filled 2020-12-19 (×7): qty 2

## 2020-12-19 NOTE — Care Management Important Message (Signed)
Important Message  Patient Details  Name: Lauren Barber MRN: [id number] Date of Birth: June 13, 1956   Medicare Important Message Given:  Yes     Shelda Altes 12/19/2020, 9:06 AM

## 2020-12-19 NOTE — Progress Notes (Addendum)
Manufacturing engineer [facility]) Hospital Liaison note.   1453: Addendum: Called pt's mother [name] again at [phone].  Left a 3rd voicemail.  Home number not in service.  Chart reviewed and eligibility confirmed. Called mother at [phone] to offer a bed today at [facility].  No answer- left voicemail x2. Other number in the chart is no working.  TOC aware.  Liaison will continue to reach out to family.  Chrislyn Edison Pace, BSN, RN [place] (listed on [place] under Hospice/[facility])    [phone]

## 2020-12-19 NOTE — Progress Notes (Signed)
Palliative Medicine RN Note: Symptom check. Pt has a bed at [facility], but they have been unable to reach Markayla's mother.  Patient is calling out, audible from down the hall. I went in and spoke with her. She was unable to clearly answer any question, as she is agitated, shaking her head but answering "yes" to some questions. I asked RN Euh to bring her Ativan.   I spoke with Unit Secretary Huel Cote, who reports that pt's mom calls her daily. She was able to get Rome's mother on the phone. I told her that she has a hospice bed and that hospice needs to talk to her. I confirmed her cell phone number [phone], which she said is the best number to call her on. We discussed how she is doing; I explained that Chrysa is agitated and that the RN will be bringing her Ativan.  Reached out to [place] to give her mother's number.  Marjie Skiff Amaurie Schreckengost, RN, BSN, [facility] Palliative Medicine Team 12/19/2020 3:15 PM Office [phone]

## 2020-12-19 NOTE — Progress Notes (Signed)
Spoke with mother and son on the phone regarding bed opening at [facility] and plan for discharging today to hospice. Mother and son in agreement. All questions answered.  Will proceed with discharge to [facility].

## 2020-12-19 NOTE — Progress Notes (Signed)
Nutrition Brief Note  Chart reviewed. Pt now transitioning to comfort care.  No further nutrition interventions warranted at this time.  Please re-consult as needed.   Korban Shearer W, RD, LDN, CDCES Registered Dietitian II Certified Diabetes Care and Education Specialist Please refer to [facility] for RD and/or RD on-call/weekend/after hours pager  

## 2020-12-19 NOTE — Progress Notes (Signed)
Paged Teaching service to call Mother regarding patient status.

## 2020-12-19 NOTE — Discharge Summary (Addendum)
[facility] Discharge Summary  Patient name: Lauren Barber Medical record number: [id number] Date of birth: 12/03/1955 Age: 65 y.o. Gender: female Date of Admission: 12/11/2020  Date of Discharge: 12/20/2020  Admitting Physician: Kinnie Feil, MD  Primary Care Provider: Salome Arnt, [place] Consultants: Nephrology, ID, palliative care, CCM  Indication for Hospitalization: Acute renal failure  Discharge Diagnoses/Problem List:  Active Problems:   AKI (acute kidney injury) ([place])   Sepsis ([place])   Acute renal failure ([place])   Acute renal failure (ARF) ([place])   Urinary tract infection with hematuria   Comfort measures only status   Hypernatremia  Disposition: [facility]   Discharge Condition: Stable  Discharge Exam:   Temp:  [97.5 F (36.4 C)-97.7 F (36.5 C)] 97.5 F (36.4 C) (01/31 2253) Pulse Rate:  [83-101] 83 (01/31 2253) Resp:  [16-20] 20 (01/31 2253) BP: (80-93)/(49-64) 80/49 (01/31 2253) SpO2:  [100 %] 100 % (01/31 2253) Physical Exam: General: Patient laying in bed comfortably, in no obvious distress. Cardiovascular: RRR, no murmurs or gallops auscultated Respiratory: lungs clear to auscultation bilaterally  Abdomen: soft, non-tender, presence of active bowel sounds Extremities: radial and dorsalis pedis pulses strong and equal bilaterally, pedal edema noted along left LE Neuro:alert, cooperative Psych: mood appropriate    Brief Hospital Course:  Lauren Barber is a 65 y.o. female who presented from [place] with altered mental status. PMH is significant for CKD, T2DM, depression, GERD, hyperlipidemia, hypothyroidism, history of CVA, and sacral ulcers.  Comfort Care During the course of the patient's hospitalization, family decided on comfort care. It was ultimately decided to pursue hospice.   Altered Mental Status On presentation, patient was responsive only to sternal rub. Per the staff at [place], patient had been more  somnolent with decreased PO intake in the 5-7 days leading up to admission. She had been diagnosed with a UTI and was receiving Keflex as an outpatient. Throughout admission, despite being treated for her UTI and renal failure, patient's mental status was not improving significantly. MRI brain and CT chest/abdomen/pelvis were all unremarkable. Her family ultimately elected to pursue hospice services.  Severe Sepsis  Pseudomonas and Enterococcus UTI Patient's urine culture grew >100,000 pansensitive pseudomonas and 50,000 vancomycin-resistant enterococcus. Patient met severe sepsis criteria with fever, tachycardia, hypotension, and lactic acidosis despite adequate antibiotic coverage and large amount of fluids. She was treated with Zosyn from 1/25-1/29, at which time she transitioned to comfort care. Workup with blood cultures, imaging, COVID swab, etc. was unrevealing for alternate source of infection.  Acute Renal Failure on CKD Patient's creatinine was 5.53 on admission (from a baseline ~2.5). In the following days her creatinine improved to 2.53 with supportive care (IV fluids), however her mental status did not improve and she was transitioned to comfort care.   Significant Procedures: none  Significant Labs and Imaging:  Recent Labs  Lab 12/15/20 1110 12/16/20 0604 12/17/20 0456  WBC 6.3 6.8 6.4  HGB 8.8* 8.4* 8.7*  HCT 28.2* 26.6* 27.8*  PLT 99* 101* 67*   Recent Labs  Lab 12/14/20 0340 12/14/20 2203 12/15/20 0100 12/15/20 1110 12/16/20 0604 12/16/20 1802 12/17/20 0456  NA 150* 150* 150*  --  155* 156* 154*  K 4.4 5.3* 4.3  --  4.0 3.8 3.6  CL 119* 123* 121*  --  122* 122* 119*  CO2 16* 11* 14*  --  16* 18* 16*  GLUCOSE 101* 126* 135*  --  97 105* 105*  BUN 133* 134* 131*  --  109* 102* 104*  CREATININE 3.11* 3.16* 3.24*  --  2.62* 2.53* 2.57*  CALCIUM 9.6 8.8* 8.7*  --  9.0 8.9 9.0  MG  --   --   --   --  2.1  --  2.0  PHOS  --  3.7 3.7  --  3.8  --  3.4  ALKPHOS 151*   --   --  182*  --   --   --   AST 70*  --   --  132*  --   --   --   ALT 145*  --   --  147*  --   --   --   ALBUMIN 2.4* 2.0* 2.0* 1.8*  --   --   --     CT ABDOMEN PELVIS WO CONTRAST  Result Date: 12/15/2020 CLINICAL DATA:  65 year old female with fever of unknown origin. EXAM: CT CHEST, ABDOMEN AND PELVIS WITHOUT CONTRAST TECHNIQUE: Multidetector CT imaging of the chest, abdomen and pelvis was performed following the standard protocol without IV contrast. COMPARISON:  Chest radiograph dated 12/15/2020 and CT abdomen pelvis dated 12/07/2020 FINDINGS: Evaluation of this exam is limited in the absence of intravenous contrast. Evaluation is also limited due to streak artifact caused by patient's arms. CT CHEST FINDINGS Cardiovascular: Borderline cardiomegaly. No pericardial effusion. Mild atherosclerotic calcification of the aortic arch. No aneurysmal dilatation. There is mild dilatation of the main pulmonary trunk suggestive of a degree of pulmonary hypertension. Clinical correlation is recommended. Mediastinum/Nodes: No hilar or mediastinal adenopathy. The esophagus is grossly unremarkable. No mediastinal fluid collection. Lungs/Pleura: Bilateral lower lobe and bibasilar streaky densities may represent atelectasis/scarring. Atypical infiltrate is less likely but not excluded clinical correlation is recommended. There is no pleural effusion pneumothorax. The central airways are patent. Musculoskeletal: Mild osteopenia and mild degenerative changes of the spine. No acute osseous pathology. CT ABDOMEN PELVIS FINDINGS No intra-abdominal free air or free fluid. Hepatobiliary: The liver is grossly unremarkable. No intrahepatic biliary ductal dilatation. Cholecystectomy. Pancreas: Unremarkable. No pancreatic ductal dilatation or surrounding inflammatory changes. Spleen: Normal in size without focal abnormality. Adrenals/Urinary Tract: The adrenal glands unremarkable. There is no hydronephrosis or nephrolithiasis  on either side. There is irregularity of the right renal contour. Subcentimeter exophytic high attenuating lesion from the inferior pole of the right kidney is not characterized on this CT. Ultrasound may provide better evaluation. The visualized ureters and urinary bladder appear unremarkable. Stomach/Bowel: There is gaseous distension of the colon. Liquid stool throughout the colon compatible with diarrhea state. There is thickened appearance of the rectosigmoid which may be related to underdistention or represent colitis. Clinical correlation is recommended. No bowel obstruction. The appendix is not visualized with certainty. No inflammatory changes identified in the right lower quadrant. Vascular/Lymphatic: Mild aortoiliac atherosclerotic disease. The IVC is unremarkable. No portal venous gas. There is no adenopathy. Reproductive: The uterus is anteverted and grossly unremarkable. There is a 5.5 cm left adnexal cyst. Because this lesion is not adequately characterized, prompt [place] is recommended for further evaluation. Note: This recommendation does not apply to premenarchal patients and to those with increased risk (genetic, family history, elevated tumor markers or other high-risk factors) of ovarian cancer. Reference: JACR 2020 Feb; 17(2):248-254 Other: Midline vertical anterior pelvic wall incisional scar. Stranding with small pockets of air in the subcutaneous soft tissues of the left anterior pelvic wall. No drainable fluid collection. Musculoskeletal: Osteopenia with degenerative changes of the spine. No acute osseous pathology. IMPRESSION: 1. Bilateral lower lobe and bibasilar  streaky densities may represent atelectasis/scarring. Atypical infiltrate is less likely but not excluded. 2. Liquid stool throughout the colon compatible with diarrhea state. Thickened appearance of the rectosigmoid may be related to underdistention or represent colitis. No bowel obstruction. 3. No hydronephrosis or nephrolithiasis.  4. A 5.5 cm left adnexal cyst. 5. Aortic Atherosclerosis (ICD10-I70.0). Electronically Signed   By: Anner Crete M.D.   On: 12/15/2020 16:28   CT ABDOMEN PELVIS WO CONTRAST  Result Date: 12/07/2020 CLINICAL DATA:  Generalized abdominal pain EXAM: CT ABDOMEN AND PELVIS WITHOUT CONTRAST TECHNIQUE: Multidetector CT imaging of the abdomen and pelvis was performed following the standard protocol without IV contrast. COMPARISON:  09/26/2017 FINDINGS: Lower chest: Mild scarring is noted in the bases bilaterally. No acute infiltrate is seen. Hepatobiliary: No focal liver abnormality is seen. Status post cholecystectomy. No biliary dilatation. Pancreas: Unremarkable. No pancreatic ductal dilatation or surrounding inflammatory changes. Spleen: Normal in size without focal abnormality. Adrenals/Urinary Tract: Adrenal glands are within normal limits. Kidneys are well visualized bilaterally. No renal calculi or obstructive changes are seen. The bladder is partially distended. Stomach/Bowel: Mild gaseous distension of the colon is seen distally although no obstructing lesion is seen. Previously seen stool ball in the sigmoid colon has passed in the interval. The appendix is within normal limits. No obstructive or inflammatory changes of the small bowel are seen. The stomach is within normal limits. Vascular/Lymphatic: Aortic atherosclerosis. No enlarged abdominal or pelvic lymph nodes. Reproductive: Uterus is within normal limits. Right adnexa is unremarkable. There is a cystic lesion identified in the left adnexa just superior to the urinary bladder which measures 5.5 cm in greatest dimension. This previously measured 3.9 cm in dimension. It is simple appearing in nature. Other: No abdominal wall hernia or abnormality. No abdominopelvic ascites. Musculoskeletal: Degenerative changes of lumbar spine are noted. No acute bony abnormality is seen. Generalized osteopenia is noted. IMPRESSION: Mild gaseous distension of the  colon without definitive obstructive lesion. This may represent a mild ileus. 5.5 cm simple appearing cyst in the left adnexa. Because this lesion is not adequately characterized, prompt but nonemergent [place] is recommended for further clarification. Note: This recommendation does not apply to premenarchal patients and to those with increased risk (genetic, family history, elevated tumor markers or other high-risk factors) of ovarian cancer. Reference: JACR 2020 Feb; 17(2):248-254 No other focal abnormality is noted. Electronically Signed   By: [name] M.D.   On: 12/07/2020 16:00   CT Head Wo Contrast  Result Date: 12/11/2020 CLINICAL DATA:  65 year old female with delirium. EXAM: CT HEAD WITHOUT CONTRAST TECHNIQUE: Contiguous axial images were obtained from the base of the skull through the vertex without intravenous contrast. COMPARISON:  Head CT dated 12/07/2020. FINDINGS: Brain: Mild age-related atrophy and moderate chronic microvascular ischemic changes. Bilateral basal ganglia old lacunar infarcts. There is no acute intracranial hemorrhage. No mass effect or midline shift. No extra-axial fluid collection. Vascular: No hyperdense vessel or unexpected calcification. Skull: Normal. Negative for fracture or focal lesion. Sinuses/Orbits: Mild mucoperiosteal thickening of paranasal sinuses. No air-fluid level. The mastoid air cells are clear. Other: None IMPRESSION: 1. No acute intracranial pathology. 2. Age-related atrophy and chronic microvascular ischemic changes. Bilateral basal ganglia old lacunar infarcts. Electronically Signed   By: Anner Crete M.D.   On: 12/11/2020 16:47   CT Head Wo Contrast  Result Date: 12/07/2020 CLINICAL DATA:  Delirium.  Intermittent altered mental status. EXAM: CT HEAD WITHOUT CONTRAST TECHNIQUE: Contiguous axial images were obtained from the base of the  skull through the vertex without intravenous contrast. COMPARISON:  Prior head CT 07/03/2018. FINDINGS: Brain: Mild  cerebral atrophy. Redemonstrated chronic right basal ganglia lacunar infarcts. Moderate ill-defined hypoattenuation within the cerebral white matter is nonspecific, but compatible with chronic small vessel ischemic disease. Redemonstrated small chronic infarct within the right cerebellar hemisphere. There is no acute intracranial hemorrhage. No demarcated cortical infarct. No extra-axial fluid collection. No evidence of intracranial mass. No midline shift. Vascular: No hyperdense vessel.  Atherosclerotic calcifications Skull: Normal. Negative for fracture or focal lesion. Sinuses/Orbits: Visualized orbits show no acute finding. Partial opacification of posterior left ethmoid air cells. IMPRESSION: No evidence of acute intracranial abnormality. Redemonstrated chronic infarcts within the right basal ganglia and right cerebellum. Stable background mild cerebral atrophy and moderate chronic small vessel ischemic disease. Left ethmoid sinusitis. Electronically Signed   By: Kellie Simmering DO   On: 12/07/2020 13:33   CT CHEST WO CONTRAST  Result Date: 12/15/2020 CLINICAL DATA:  66 year old female with fever of unknown origin. EXAM: CT CHEST, ABDOMEN AND PELVIS WITHOUT CONTRAST TECHNIQUE: Multidetector CT imaging of the chest, abdomen and pelvis was performed following the standard protocol without IV contrast. COMPARISON:  Chest radiograph dated 12/15/2020 and CT abdomen pelvis dated 12/07/2020 FINDINGS: Evaluation of this exam is limited in the absence of intravenous contrast. Evaluation is also limited due to streak artifact caused by patient's arms. CT CHEST FINDINGS Cardiovascular: Borderline cardiomegaly. No pericardial effusion. Mild atherosclerotic calcification of the aortic arch. No aneurysmal dilatation. There is mild dilatation of the main pulmonary trunk suggestive of a degree of pulmonary hypertension. Clinical correlation is recommended. Mediastinum/Nodes: No hilar or mediastinal adenopathy. The esophagus  is grossly unremarkable. No mediastinal fluid collection. Lungs/Pleura: Bilateral lower lobe and bibasilar streaky densities may represent atelectasis/scarring. Atypical infiltrate is less likely but not excluded clinical correlation is recommended. There is no pleural effusion pneumothorax. The central airways are patent. Musculoskeletal: Mild osteopenia and mild degenerative changes of the spine. No acute osseous pathology. CT ABDOMEN PELVIS FINDINGS No intra-abdominal free air or free fluid. Hepatobiliary: The liver is grossly unremarkable. No intrahepatic biliary ductal dilatation. Cholecystectomy. Pancreas: Unremarkable. No pancreatic ductal dilatation or surrounding inflammatory changes. Spleen: Normal in size without focal abnormality. Adrenals/Urinary Tract: The adrenal glands unremarkable. There is no hydronephrosis or nephrolithiasis on either side. There is irregularity of the right renal contour. Subcentimeter exophytic high attenuating lesion from the inferior pole of the right kidney is not characterized on this CT. Ultrasound may provide better evaluation. The visualized ureters and urinary bladder appear unremarkable. Stomach/Bowel: There is gaseous distension of the colon. Liquid stool throughout the colon compatible with diarrhea state. There is thickened appearance of the rectosigmoid which may be related to underdistention or represent colitis. Clinical correlation is recommended. No bowel obstruction. The appendix is not visualized with certainty. No inflammatory changes identified in the right lower quadrant. Vascular/Lymphatic: Mild aortoiliac atherosclerotic disease. The IVC is unremarkable. No portal venous gas. There is no adenopathy. Reproductive: The uterus is anteverted and grossly unremarkable. There is a 5.5 cm left adnexal cyst. Because this lesion is not adequately characterized, prompt [place] is recommended for further evaluation. Note: This recommendation does not apply to premenarchal  patients and to those with increased risk (genetic, family history, elevated tumor markers or other high-risk factors) of ovarian cancer. Reference: JACR 2020 Feb; 17(2):248-254 Other: Midline vertical anterior pelvic wall incisional scar. Stranding with small pockets of air in the subcutaneous soft tissues of the left anterior pelvic wall. No drainable fluid collection.  Musculoskeletal: Osteopenia with degenerative changes of the spine. No acute osseous pathology. IMPRESSION: 1. Bilateral lower lobe and bibasilar streaky densities may represent atelectasis/scarring. Atypical infiltrate is less likely but not excluded. 2. Liquid stool throughout the colon compatible with diarrhea state. Thickened appearance of the rectosigmoid may be related to underdistention or represent colitis. No bowel obstruction. 3. No hydronephrosis or nephrolithiasis. 4. A 5.5 cm left adnexal cyst. 5. Aortic Atherosclerosis (ICD10-I70.0). Electronically Signed   By: Anner Crete M.D.   On: 12/15/2020 16:28   MR BRAIN W WO CONTRAST  Result Date: 12/13/2020 CLINICAL DATA:  Worsening mental status. EXAM: MRI HEAD WITHOUT AND WITH CONTRAST TECHNIQUE: Multiplanar, multiecho pulse sequences of the brain and surrounding structures were obtained without and with intravenous contrast. CONTRAST:  52mL GADAVIST GADOBUTROL 1 MMOL/ML IV SOLN COMPARISON:  Head CT 12/11/2020 FINDINGS: Brain: The study suffers from motion degradation. Diffusion imaging does not show any acute or subacute infarction. There are chronic small-vessel changes of the pons. There are a few old small vessel cerebellar infarctions. Cerebral hemispheres show old infarction in the right caudate and chronic small-vessel ischemic changes affecting the hemispheric deep white matter. No large vessel territory infarction. No mass lesion, hemorrhage, hydrocephalus or extra-axial collection. After contrast administration, no abnormal enhancement is seen. Vascular: Major vessels at  the base of the brain show flow. Skull and upper cervical spine: Negative Sinuses/Orbits: Clear except for some fluid in the posterior ethmoid region on the left. Orbits appear unremarkable as seen. Other: None IMPRESSION: Marked motion degradation. No acute or reversible finding. Chronic small-vessel ischemic changes throughout the brain as outlined above. Electronically Signed   By: Nelson Chimes M.D.   On: 12/13/2020 13:21   US RENAL  Result Date: 12/11/2020 CLINICAL DATA:  Acute renal insufficiency EXAM: RENAL / URINARY TRACT ULTRASOUND COMPLETE COMPARISON:  CT 12/06/2020 FINDINGS: Right Kidney: Renal measurements: 6.4 x 4.7 x 5.4 cm = volume: 137 mL. Echogenicity within normal limits. No mass or hydronephrosis visualized. Mild renal cortical thinning. Left Kidney: Renal measurements: 6.9 x 4.9 x 4.2 cm = volume: 118 mL. Echogenicity within normal limits. No mass or hydronephrosis visualized. Mild renal cortical thinning. Bladder: Grossly within normal limits. Other: Exam is degraded by patient body habitus. note is made of a simple cystic lesion within the left adnexa including at 4.9 cm. This is incompletely evaluated on this nondedicated exam. IMPRESSION: No hydronephrosis. Mild renal cortical thinning. No other explanation for renal insufficiency. Limitations secondary to patient body habitus. Electronically Signed   By: Abigail Miyamoto M.D.   On: 12/11/2020 18:03   DG CHEST PORT 1 VIEW  Result Date: 12/15/2020 CLINICAL DATA:  Sepsis EXAM: PORTABLE CHEST 1 VIEW COMPARISON:  12/11/2020 FINDINGS: Left basilar scarring unchanged. Pulmonary insufflation is stable. No superimposed focal pulmonary infiltrate. No pneumothorax or pleural effusion. Cardiac size is at the upper limits of normal. IMPRESSION: No active disease. Electronically Signed   By: Fidela Salisbury MD   On: 12/15/2020 06:35   DG Chest Port 1 View  Result Date: 12/11/2020 CLINICAL DATA:  Question sepsis. EXAM: PORTABLE CHEST 1 VIEW  COMPARISON:  December 07, 2020 FINDINGS: The mediastinal contour is stable. The heart size is enlarged. There is no focal infiltrate, pulmonary edema, or pleural effusion. Mild atelectasis of the left mid lung is noted. The bony structures are normal. IMPRESSION: No focal pneumonia. Electronically Signed   By: Abelardo Diesel M.D.   On: 12/11/2020 13:27   DG Chest [facility] 1 View  Result  Date: 12/07/2020 CLINICAL DATA:  Delirium EXAM: PORTABLE CHEST 1 VIEW COMPARISON:  06/01/2015 chest radiograph. FINDINGS: Stable cardiomediastinal silhouette with mild cardiomegaly. No pneumothorax. No pleural effusion. No pulmonary edema. Hazy left retrocardiac opacity. IMPRESSION: 1. Stable mild cardiomegaly without pulmonary edema. 2. Hazy left retrocardiac opacity, favor atelectasis, difficult to exclude a component of pneumonia. Electronically Signed   By: Delbert Phenix M.D.   On: 12/07/2020 13:23   ECHOCARDIOGRAM COMPLETE  Result Date: 12/15/2020    ECHOCARDIOGRAM REPORT   Patient Name:   Lauren Barber Date of Exam: 12/15/2020 Medical Rec #:  [id number]       Height:       69.0 in Accession #:    [id number]      Weight:       258.2 lb Date of Birth:  February 05, 1956       BSA:          2.303 m Patient Age:    64 years        BP:           95/65 mmHg Patient Gender: F               HR:           106 bpm. Exam Location:  Inpatient Procedure: 2D Echo, Cardiac Doppler, Color Doppler and Intracardiac            Opacification Agent Indications:    Fever  History:        Patient has no prior history of Echocardiogram examinations.                 Risk Factors:Hypertension and Diabetes. CKD.  Sonographer:    Ross Ludwig RDCS (AE) Referring Phys: Lyndel Pleasure Chrissie Noa A HENSEL  Sonographer Comments: Technically challenging study due to limited acoustic windows, Technically difficult study due to poor echo windows and suboptimal apical window. Image acquisition challenging due to patient body habitus. IMPRESSIONS  1. Left ventricular ejection  fraction, by estimation, is 40 to 45%. The left ventricle has mildly decreased function. The left ventricle demonstrates global hypokinesis. There is mild left ventricular hypertrophy. Left ventricular diastolic parameters are indeterminate.  2. Right ventricular systolic function is normal. The right ventricular size is normal. There is normal pulmonary artery systolic pressure. The estimated right ventricular systolic pressure is 32.4 mmHg.  3. The mitral valve is normal in structure. Trivial mitral valve regurgitation. No evidence of mitral stenosis.  4. The aortic valve is tricuspid. Aortic valve regurgitation is mild. Mild aortic valve sclerosis is present, with no evidence of aortic valve stenosis.  5. The inferior vena cava is normal in size with greater than 50% respiratory variability, suggesting right atrial pressure of 3 mmHg.  6. Technically difficult study with poor acoustic windows. FINDINGS  Left Ventricle: Left ventricular ejection fraction, by estimation, is 40 to 45%. The left ventricle has mildly decreased function. The left ventricle demonstrates global hypokinesis. Definity contrast agent was given IV to delineate the left ventricular  endocardial borders. The left ventricular internal cavity size was normal in size. There is mild left ventricular hypertrophy. Left ventricular diastolic parameters are indeterminate. Right Ventricle: The right ventricular size is normal. No increase in right ventricular wall thickness. Right ventricular systolic function is normal. There is normal pulmonary artery systolic pressure. The tricuspid regurgitant velocity is 2.71 m/s, and  with an assumed right atrial pressure of 3 mmHg, the estimated right ventricular systolic pressure is 32.4 mmHg. Left Atrium: Left atrial size was normal  in size. Right Atrium: Right atrial size was normal in size. Pericardium: There is no evidence of pericardial effusion. Mitral Valve: The mitral valve is normal in structure.  Trivial mitral valve regurgitation. No evidence of mitral valve stenosis. Tricuspid Valve: The tricuspid valve is normal in structure. Tricuspid valve regurgitation is mild. Aortic Valve: The aortic valve is tricuspid. Aortic valve regurgitation is mild. Mild aortic valve sclerosis is present, with no evidence of aortic valve stenosis. Aortic valve mean gradient measures 6.0 mmHg. Aortic valve peak gradient measures 11.8 mmHg. Aortic valve area, by VTI measures 1.95 cm. Pulmonic Valve: The pulmonic valve was normal in structure. Pulmonic valve regurgitation is trivial. Aorta: The aortic root is normal in size and structure. Venous: The inferior vena cava is normal in size with greater than 50% respiratory variability, suggesting right atrial pressure of 3 mmHg. IAS/Shunts: No atrial level shunt detected by color flow Doppler.  LEFT VENTRICLE PLAX 2D LVIDd:         4.50 cm LVIDs:         3.30 cm LV PW:         2.40 cm LV IVS:        1.10 cm LVOT diam:     2.10 cm LV SV:         52 LV SV Index:   23 LVOT Area:     3.46 cm  RIGHT VENTRICLE             IVC RV Basal diam:  3.10 cm     IVC diam: 0.90 cm RV S prime:     14.10 cm/s TAPSE (M-mode): 2.5 cm LEFT ATRIUM             Index       RIGHT ATRIUM          Index LA diam:        2.50 cm 1.09 cm/m  RA Area:     9.85 cm LA Vol (A2C):   50.7 ml 22.02 ml/m RA Volume:   19.30 ml 8.38 ml/m LA Vol (A4C):   49.4 ml 21.45 ml/m LA Biplane Vol: 49.9 ml 21.67 ml/m  AORTIC VALVE AV Area (Vmax):    2.17 cm AV Area (Vmean):   2.12 cm AV Area (VTI):     1.95 cm AV Vmax:           172.00 cm/s AV Vmean:          111.000 cm/s AV VTI:            0.266 m AV Peak Grad:      11.8 mmHg AV Mean Grad:      6.0 mmHg LVOT Vmax:         108.00 cm/s LVOT Vmean:        67.900 cm/s LVOT VTI:          0.150 m LVOT/AV VTI ratio: 0.56  AORTA Ao Root diam: 2.90 cm Ao Asc diam:  3.00 cm TRICUSPID VALVE TR Peak grad:   29.4 mmHg TR Vmax:        271.00 cm/s  SHUNTS Systemic VTI:  0.15 m Systemic  Diam: 2.10 cm Loralie Champagne MD Electronically signed by Loralie Champagne MD Signature Date/Time: 12/15/2020/11:49:31 PM    Final      Results/Tests Pending at Time of Discharge: none  Discharge Medications:  Allergies as of 12/20/2020       Reactions   Nsaids Other (See Comments)   CKD Cr 2  Shellfish Allergy Other (See Comments)   Per MAR        Medication List     STOP taking these medications    allopurinol 100 MG tablet Commonly known as: ZYLOPRIM   amitriptyline 75 MG tablet Commonly known as: ELAVIL   aspirin EC 81 MG tablet   atorvastatin 20 MG tablet Commonly known as: LIPITOR   BIOFREEZE ROLL-ON EX   cephALEXin 250 MG capsule Commonly known as: KEFLEX   desvenlafaxine 100 MG 24 hr tablet Commonly known as: PRISTIQ   EPINEPHrine 0.3 mg/0.3 mL Devi Commonly known as: EPI-PEN   famotidine 20 MG tablet Commonly known as: PEPCID   gabapentin 600 MG tablet Commonly known as: NEURONTIN   HYDROcodone-acetaminophen 5-325 MG tablet Commonly known as: NORCO/VICODIN   Januvia 50 MG tablet Generic drug: sitaGLIPtin   levothyroxine 50 MCG tablet Commonly known as: SYNTHROID   lubiprostone 8 MCG capsule Commonly known as: AMITIZA   metoCLOPramide 10 MG tablet Commonly known as: Reglan   Mitigare 0.6 MG Caps Generic drug: Colchicine   Na Sulfate-K Sulfate-Mg Sulf 17.5-3.13-1.6 GM/177ML Soln Commonly known as: Suprep Bowel Prep Kit   pantoprazole 40 MG tablet Commonly known as: PROTONIX   polyethylene glycol 17 g packet Commonly known as: MIRALAX / GLYCOLAX   PROZAC PO   psyllium 95 % Pack Commonly known as: HYDROCIL/METAMUCIL   REFRESH TEARS OP   sennosides-docusate sodium 8.6-50 MG tablet Commonly known as: SENOKOT-S   sodium chloride 0.65 % Soln nasal spray Commonly known as: OCEAN   spironolactone 25 MG tablet Commonly known as: ALDACTONE   tiZANidine 2 MG tablet Commonly known as: ZANAFLEX   torsemide 10 MG tablet Commonly known  as: DEMADEX   Trulicity 1.5 PJ/8.2NK Sopn Generic drug: Dulaglutide   Vitamin D (Ergocalciferol) 1.25 MG (50000 UNIT) Caps capsule Commonly known as: DRISDOL       TAKE these medications    acetaminophen 325 MG tablet Commonly known as: TYLENOL Take 2 tablets (650 mg total) by mouth every 6 (six) hours as needed for mild pain (or Fever >/= 101). What changed: reasons to take this   acetaminophen 650 MG suppository Commonly known as: TYLENOL Place 1 suppository (650 mg total) rectally every 6 (six) hours as needed for mild pain (or Fever >/= 101). What changed: You were already taking a medication with the same name, and this prescription was added. Make sure you understand how and when to take each.   antiseptic oral rinse Liqd Apply 15 mLs topically as needed for dry mouth.   bisacodyl 10 MG suppository Commonly known as: DULCOLAX Place 1 suppository (10 mg total) rectally daily as needed (constipation). What changed:  when to take this reasons to take this   fentaNYL 100 MCG/2ML injection Commonly known as: SUBLIMAZE Inject 1 mL (50 mcg total) into the vein every hour as needed (Pain/Dyspnea).   fentaNYL 100 MCG/2ML injection Commonly known as: SUBLIMAZE Inject 0.5 mLs (25 mcg total) into the vein every 4 (four) hours.   glycopyrrolate 0.2 MG/ML injection Commonly known as: ROBINUL Inject 2 mLs (0.4 mg total) into the vein every 4 (four) hours as needed (Secretions).   liver oil-zinc oxide 40 % ointment Commonly known as: DESITIN Apply topically 2 (two) times daily.   LORazepam 2 MG/ML injection Commonly known as: ATIVAN Inject 0.25-0.5 mLs (0.5-1 mg total) into the vein every hour as needed for anxiety or sedation.   ondansetron 4 MG disintegrating tablet Commonly known as: ZOFRAN-ODT Take 1 tablet (  4 mg total) by mouth every 6 (six) hours as needed for nausea.   ondansetron 4 MG/2ML Soln injection Commonly known as: ZOFRAN Inject 2 mLs (4 mg total) into  the vein every 6 (six) hours as needed for nausea.   polyvinyl alcohol 1.4 % ophthalmic solution Commonly known as: LIQUIFILM TEARS Place 1 drop into both eyes every 4 (four) hours as needed for dry eyes.               Discharge Care Instructions  (From admission, onward)           Start     Ordered   12/20/20 0000  Discharge wound care:       Comments: Apply Desitin to bilat buttocks and sacrum BID and PRN when cleaning   12/20/20 1212            Discharge Instructions: Please refer to Patient Instructions section of EMR for full details.  Patient was counseled important signs and symptoms that should prompt return to medical care, changes in medications, dietary instructions, activity restrictions, and follow up appointments.   Follow-Up Appointments:    Donney Dice, DO 12/20/2020, 12:12 PM PGY-1, [place] Upper-Level Resident Addendum   I have independently interviewed and examined the patient. I have discussed the above with the original author and agree with their documentation. My edits for correction/addition/clarification are in blue. Please see also any attending notes.    Matilde Haymaker MD PGY-3, [facility] 12/20/2020 7:34 PM  FPTS Service pager: [phone] (text pages welcome through [place])

## 2020-12-19 NOTE — Progress Notes (Addendum)
Family Medicine Teaching Service Daily Progress Note Intern Pager: [phone]  Patient name: Lauren Barber Medical record number: [id number] Date of birth: Feb 20, 1956 Age: 65 y.o. Gender: female  Primary Care Provider: Salome Arnt, [place] Consultants: Neprhology, ID, palliative care, CCM   Code Status: DNR  Pt Overview and Major Events to Date:  1/23 admitted 1/28 made DNR 1/29 made [place]  Assessment and Plan: Magda Muise is a 65 y.o. female who presents with altered mental status in the setting of acute renal failure and sepsis now transitioned to [place]. PMHx significant for: CKD, T2DM, depression, GERD, HLD, hypothyroidism, history of CVA, sacral ulcers.  UTI Cultures growing pseudomonas and VRE.  No longer meeting sepsis criteria.  She was being treated with IV antibiotics, however now discontinued as she has been transitioned to [place].  Acute renal failure Overall had been improving.  Poor dialysis candidate per nephrology.  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  Encephalopathy In the setting of acute renal failure/uremia and UTI.  Patient is alert, but is not making any meaningful speech this morning.  She does however seem to shake her head "no" when asked if she has any pain. - appreciate palliative care involvement  Hypernatremia IV fluids discontinued as she is CMO.  Sacral skin changes Evaluated by wound care.  Skin changes deemed to be scar tissue, not active wounds. - Desitin prn  Other medications for chronic issues discontinued.  FEN/GI: softi diet PPx: none  Disposition: hospice (bed available today; however, mother unable to complete required paperwork in time. Mother has appointment for paperwork tomorrow at 11:30 AM, will plan to discharge to hospice after completion of paperwork)  Subjective:  NAOE.  Mumbling "mama" intermittently.  Shakes her head "no" when asked if she is in pain.  Objective: Temp:  [97.4 F (36.3 C)-98.4 F (36.9 C)] 98.4  F (36.9 C) (01/30 1934) Pulse Rate:  [100-102] 102 (01/30 1934) Resp:  [18-19] 19 (01/30 1934) BP: (95-97)/(59-64) 97/59 (01/30 1934) SpO2:  [100 %] 100 % (01/30 1934) Physical Exam: General: Alert, intermittently grimacing, appears comfortable otherwise CV: Mildly tachycardic, regular rhythm, no murmurs Pulm: CTAB, respirations non-labored Abd: soft, non-tender, +BS Ext: WWP, trace edema  Laboratory: Recent Labs  Lab 12/15/20 1110 12/16/20 0604 12/17/20 0456  WBC 6.3 6.8 6.4  HGB 8.8* 8.4* 8.7*  HCT 28.2* 26.6* 27.8*  PLT 99* 101* 67*   Recent Labs  Lab 12/13/20 0427 12/14/20 0340 12/14/20 2203 12/15/20 1110 12/16/20 0604 12/16/20 1802 12/17/20 0456  NA 145 150*   < >  --  155* 156* 154*  K 3.8 4.4   < >  --  4.0 3.8 3.6  CL 107 119*   < >  --  122* 122* 119*  CO2 24 16*   < >  --  16* 18* 16*  BUN 119* 133*   < >  --  109* 102* 104*  CREATININE 2.85* 3.11*   < >  --  2.62* 2.53* 2.57*  CALCIUM 8.1* 9.6   < >  --  9.0 8.9 9.0  PROT 6.1* 7.3  --  6.0*  --   --   --   BILITOT 0.7 1.1  --  0.7  --   --   --   ALKPHOS 116 151*  --  182*  --   --   --   ALT 153* 145*  --  147*  --   --   --   AST 82* 70*  --  132*  --   --   --   GLUCOSE 84 101*   < >  --  97 105* 105*   < > = values in this interval not displayed.    Imaging/Diagnostic Tests: No new imaging.  Zola Button, MD 12/19/2020, 6:46 AM PGY-1, [place] Intern pager: [phone], text pages welcome

## 2020-12-20 LAB — CULTURE, BLOOD (ROUTINE X 2)
Culture: NO GROWTH
Culture: NO GROWTH
Special Requests: ADEQUATE

## 2020-12-20 MED ORDER — LORAZEPAM 2 MG/ML IJ SOLN: 0.5000 mg | INTRAMUSCULAR | 0 refills | Status: AC | PRN

## 2020-12-20 MED ORDER — ACETAMINOPHEN 650 MG RE SUPP
650.0000 mg | Freq: Four times a day (QID) | RECTAL | 0 refills | Status: AC | PRN
Start: 2020-12-20 — End: ?

## 2020-12-20 MED ORDER — ONDANSETRON HCL 4 MG/2ML IJ SOLN: 4.0000 mg | Freq: Four times a day (QID) | INTRAMUSCULAR | 0 refills | Status: AC | PRN

## 2020-12-20 MED ORDER — ONDANSETRON 4 MG PO TBDP: 4.0000 mg | ORAL_TABLET | Freq: Four times a day (QID) | ORAL | 0 refills | Status: AC | PRN

## 2020-12-20 MED ORDER — GLYCOPYRROLATE 0.2 MG/ML IJ SOLN: 0.4000 mg | INTRAMUSCULAR | Status: AC | PRN

## 2020-12-20 MED ORDER — FENTANYL CITRATE (PF) 100 MCG/2ML IJ SOLN
50.0000 ug | INTRAMUSCULAR | 0 refills | Status: DC | PRN
Start: 2020-12-20 — End: 2020-12-20

## 2020-12-20 MED ORDER — ZINC OXIDE 40 % EX OINT: TOPICAL_OINTMENT | Freq: Two times a day (BID) | CUTANEOUS | 0 refills | Status: AC

## 2020-12-20 MED ORDER — FENTANYL CITRATE (PF) 100 MCG/2ML IJ SOLN: 25.0000 ug | INTRAMUSCULAR | 0 refills | Status: DC

## 2020-12-20 MED ORDER — BISACODYL 10 MG RE SUPP: 10.0000 mg | Freq: Every day | RECTAL | 0 refills | Status: AC | PRN

## 2020-12-20 MED ORDER — BIOTENE DRY MOUTH MT LIQD: 15.0000 mL | OROMUCOSAL | Status: AC | PRN

## 2020-12-20 MED ORDER — ACETAMINOPHEN 325 MG PO TABS: 650.0000 mg | ORAL_TABLET | Freq: Four times a day (QID) | ORAL | Status: AC | PRN

## 2020-12-20 NOTE — Progress Notes (Addendum)
Family Medicine Teaching Service Daily Progress Note Intern Pager: [phone]  Patient name: Lauren Barber Medical record number: [id number] Date of birth: 05-13-1956 Age: 65 y.o. Gender: female  Primary Care Provider: Salome Arnt, [place] Consultants: Nephrology, ID, palliative care, CCM Code Status: DNR  Pt Overview and Major Events to Date:  1/23 admitted 1/28 made DNR 1/29 made [place]  Assessment and Plan: Lauren Barber is a 65 y.o. female who presents with altered mental status in the setting of acute renal failure and sepsis now transitioned to [place]. PMHx significant for: CKD, T2DM, depression, GERD, HLD, hypothyroidism, history of CVA, sacral ulcers.  Comfort care Family desires comfort care, currently pending discharge to [facility]. Medically stable for discharge at this time.  UTI Cultures growing pseudomonas and VRE.  No longer meeting sepsis criteria.  She was being treated with IV antibiotics, however now discontinued as she has been transitioned to [place].  Acute renal failure Improving.  Poor dialysis candidate per nephrology.  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  Encephalopathy In the setting of acute renal failure/uremia and UTI.  Patient is alert, but does not speak this morning, she communicates with head nodding and facial cues.  She does however seem to shake her head "no" when asked if she has any pain. - palliative care involved, patient [facility] SNF [facility]   Sacral skin changes Evaluated by wound care.  Skin changes deemed to be scar tissue, not active wounds. - Desitin prn  Other medications for chronic issues discontinued.  FEN/GI: soft diet  PPx: none   Dispo: [facility]     Subjective:  No acute overnight events. Patient does not speak but appears to not be in any pain. Communicates with head nodding to answer questions. Denies any pain or concerns this morning.   Objective: Temp:  [97.5 F (36.4 C)-97.7 F (36.5  C)] 97.5 F (36.4 C) (01/31 2253) Pulse Rate:  [83-101] 83 (01/31 2253) Resp:  [16-20] 20 (01/31 2253) BP: (80-93)/(49-64) 80/49 (01/31 2253) SpO2:  [100 %] 100 % (01/31 2253) Physical Exam: General: Patient laying in bed comfortably, in no obvious distress. Cardiovascular: RRR, no murmurs or gallops auscultated Respiratory: lungs clear to auscultation bilaterally  Abdomen: soft, non-tender, presence of active bowel sounds Extremities: radial and dorsalis pedis pulses strong and equal bilaterally, pedal edema noted along left LE Neuro:alert, cooperative Psych: mood appropriate   Laboratory: Recent Labs  Lab 12/15/20 1110 12/16/20 0604 12/17/20 0456  WBC 6.3 6.8 6.4  HGB 8.8* 8.4* 8.7*  HCT 28.2* 26.6* 27.8*  PLT 99* 101* 67*   Recent Labs  Lab 12/14/20 0340 12/14/20 2203 12/15/20 1110 12/16/20 0604 12/16/20 1802 12/17/20 0456  NA 150*   < >  --  155* 156* 154*  K 4.4   < >  --  4.0 3.8 3.6  CL 119*   < >  --  122* 122* 119*  CO2 16*   < >  --  16* 18* 16*  BUN 133*   < >  --  109* 102* 104*  CREATININE 3.11*   < >  --  2.62* 2.53* 2.57*  CALCIUM 9.6   < >  --  9.0 8.9 9.0  PROT 7.3  --  6.0*  --   --   --   BILITOT 1.1  --  0.7  --   --   --   ALKPHOS 151*  --  182*  --   --   --  ALT 145*  --  147*  --   --   --   AST 70*  --  132*  --   --   --   GLUCOSE 101*   < >  --  97 105* 105*   < > = values in this interval not displayed.      Imaging/Diagnostic Tests: No results found.  Donney Dice, DO 12/20/2020, 5:57 AM PGY-1, [place] Intern pager: [phone], text pages welcome

## 2020-12-20 NOTE — Progress Notes (Signed)
Report called and given to Arbie Cookey at [facility]

## 2020-12-20 NOTE — Plan of Care (Signed)
Patient has transitioned to comfort care. All care plans will be closed as patient is transferring to [facility] Problem: Education: Goal: Knowledge of General Education information will improve Description: Including pain rating scale, medication(s)/side effects and non-pharmacologic comfort measures Outcome: Adequate for Discharge   Problem: Health Behavior/Discharge Planning: Goal: Ability to manage health-related needs will improve Outcome: Adequate for Discharge   Problem: Clinical Measurements: Goal: Ability to maintain clinical measurements within normal limits will improve Outcome: Adequate for Discharge Goal: Will remain free from infection Outcome: Adequate for Discharge Goal: Diagnostic test results will improve Outcome: Adequate for Discharge Goal: Respiratory complications will improve Outcome: Adequate for Discharge Goal: Cardiovascular complication will be avoided Outcome: Adequate for Discharge   Problem: Activity: Goal: Risk for activity intolerance will decrease Outcome: Adequate for Discharge   Problem: Nutrition: Goal: Adequate nutrition will be maintained Outcome: Adequate for Discharge   Problem: Coping: Goal: Level of anxiety will decrease Outcome: Adequate for Discharge   Problem: Elimination: Goal: Will not experience complications related to bowel motility Outcome: Adequate for Discharge Goal: Will not experience complications related to urinary retention Outcome: Adequate for Discharge   Problem: Pain Managment: Goal: General experience of comfort will improve Outcome: Adequate for Discharge   Problem: Safety: Goal: Ability to remain free from injury will improve Outcome: Adequate for Discharge   Problem: Skin Integrity: Goal: Risk for impaired skin integrity will decrease Outcome: Adequate for Discharge

## 2020-12-20 NOTE — Consult Note (Signed)
WOC Nurse Consult Note: Reason for Consult: Buttock and sacral lesions. Last seen by my associate D. Engels on 12/12/20.  Wound type: Pressure, moisture Pressure Injury POA: Yes Measurement:Entire affected area measures 11cm x 12cm with central Unstageable PI measuring 6cm x 6cm with depth obscured by nonviable tissue. Healed lesions on posterior thighs and lower buttocks are evident. Wound bed: As noted above. Where there are moisture lesions on the buttocks, the partial thickness tissue is pink, moist. Drainage (amount, consistency, odor) small amount of serous to serosanguinous Periwound: intact, very dry on back. Moist at subpannicular skin fold Dressing procedure/placement/frequency: I understand the patient is scheduled for Hospice residential care but that a bed is not currently available. IN the interim, I am providing pressure redistribution heel boots, and a bariatric mattress replacement for both the patient's comfort and to assist the staff as they are providing care. InterDry antimicrobial textile is to be placed in the subpannicular skin fold.  [facility] nursing team will not follow, but will remain available to this patient, the nursing and medical teams.  Please re-consult if needed. Thanks, Maudie Flakes, MSN, RN, [place], [facility]  Pager# [phone]

## 2020-12-20 NOTE — TOC Transition Note (Signed)
Transition of Care [facility]) - CM/SW Discharge Note   Patient Details  Name: Lauren Barber MRN: [id number] Date of Birth: [birth year]  Transition of Care [facility]) CM/SW Contact:  Bethann Berkshire, [place] Phone Number: 12/20/2020, 12:24 PM   Clinical Narrative:     Patient will DC to: [facility] Anticipated DC date: 12/20/20 Family notified: Mother [name] Transport by: [facility]   Per MD patient ready for DC to [facility] . RN, patient, patient's family, and facility notified of DC. Discharge Summary and FL2 sent to facility. RN to call report prior to discharge [phone]). DC packet on chart. Ambulance transport requested for patient.   CSW will sign off for now as social work intervention is no longer needed. Please consult [place] again if new needs arise.   Final next level of care: [place]     Patient Goals and CMS Choice        Discharge Placement              Patient chooses bed at:  [facility]) Patient to be transferred to facility by: [facility]      Discharge Plan and Services                                     Social Determinants of Health (SDOH) Interventions     Readmission Risk Interventions No flowsheet data found.

## 2020-12-20 NOTE — Progress Notes (Signed)
Manufacturing engineer [facility]) Hospital Liaison note.    Family agreeable to transfer today. TOC aware.   Registration paperwork has been completed. Please arrange transport, ensuring that he [place] DNR form transports with the patient, please.   RN please call report to [phone].  Thank you for the opportunity to participate in this patient's care.  Chrislyn Edison Pace, BSN, RN [place] (listed on [facility] under Hospice/[facility])    [phone]
